# Patient Record
Sex: Female | Born: 1960 | Race: White | Hispanic: No | Marital: Married | Smoking: Never smoker
Health system: Southern US, Community
[De-identification: ages and names within clinical notes are randomized; demographics above are authoritative.]

## PROBLEM LIST (undated history)

## (undated) DIAGNOSIS — Z9289 Personal history of other medical treatment: Secondary | ICD-10-CM

## (undated) DIAGNOSIS — D649 Anemia, unspecified: Secondary | ICD-10-CM

## (undated) DIAGNOSIS — F32A Depression, unspecified: Secondary | ICD-10-CM

## (undated) DIAGNOSIS — E7800 Pure hypercholesterolemia, unspecified: Secondary | ICD-10-CM

## (undated) DIAGNOSIS — F419 Anxiety disorder, unspecified: Secondary | ICD-10-CM

## (undated) DIAGNOSIS — I639 Cerebral infarction, unspecified: Secondary | ICD-10-CM

## (undated) DIAGNOSIS — Z8719 Personal history of other diseases of the digestive system: Secondary | ICD-10-CM

## (undated) DIAGNOSIS — I10 Essential (primary) hypertension: Secondary | ICD-10-CM

---

## 2003-10-12 ENCOUNTER — Other Ambulatory Visit: Admission: RE | Admit: 2003-10-12 | Discharge: 2003-10-12 | Payer: Self-pay | Admitting: Obstetrics and Gynecology

## 2004-10-16 ENCOUNTER — Other Ambulatory Visit: Admission: RE | Admit: 2004-10-16 | Discharge: 2004-10-16 | Payer: Self-pay | Admitting: Obstetrics and Gynecology

## 2005-12-11 ENCOUNTER — Other Ambulatory Visit: Admission: RE | Admit: 2005-12-11 | Discharge: 2005-12-11 | Payer: Self-pay | Admitting: Obstetrics and Gynecology

## 2007-03-12 ENCOUNTER — Encounter: Admission: RE | Admit: 2007-03-12 | Discharge: 2007-03-12 | Payer: Self-pay | Admitting: Emergency Medicine

## 2007-03-17 ENCOUNTER — Encounter: Admission: RE | Admit: 2007-03-17 | Discharge: 2007-03-17 | Payer: Self-pay | Admitting: Emergency Medicine

## 2010-12-10 ENCOUNTER — Encounter: Payer: Self-pay | Admitting: Emergency Medicine

## 2016-06-07 DIAGNOSIS — I10 Essential (primary) hypertension: Secondary | ICD-10-CM | POA: Insufficient documentation

## 2016-06-07 DIAGNOSIS — E7800 Pure hypercholesterolemia, unspecified: Secondary | ICD-10-CM | POA: Insufficient documentation

## 2017-02-28 DIAGNOSIS — Z8673 Personal history of transient ischemic attack (TIA), and cerebral infarction without residual deficits: Secondary | ICD-10-CM | POA: Insufficient documentation

## 2017-02-28 DIAGNOSIS — Z6830 Body mass index (BMI) 30.0-30.9, adult: Secondary | ICD-10-CM | POA: Insufficient documentation

## 2017-02-28 DIAGNOSIS — E6609 Other obesity due to excess calories: Secondary | ICD-10-CM | POA: Insufficient documentation

## 2017-02-28 DIAGNOSIS — K219 Gastro-esophageal reflux disease without esophagitis: Secondary | ICD-10-CM | POA: Insufficient documentation

## 2017-12-19 DIAGNOSIS — F325 Major depressive disorder, single episode, in full remission: Secondary | ICD-10-CM | POA: Insufficient documentation

## 2020-07-20 DIAGNOSIS — C801 Malignant (primary) neoplasm, unspecified: Secondary | ICD-10-CM

## 2020-07-20 DIAGNOSIS — E119 Type 2 diabetes mellitus without complications: Secondary | ICD-10-CM

## 2020-07-20 HISTORY — DX: Type 2 diabetes mellitus without complications: E11.9

## 2020-07-20 HISTORY — DX: Malignant (primary) neoplasm, unspecified: C80.1

## 2020-08-20 ENCOUNTER — Inpatient Hospital Stay (HOSPITAL_BASED_OUTPATIENT_CLINIC_OR_DEPARTMENT_OTHER)
Admission: EM | Admit: 2020-08-20 | Discharge: 2020-08-24 | DRG: 436 | Disposition: A | Discharge status: Home / Self Care | Payer: 59 | Attending: Internal Medicine | Admitting: Internal Medicine

## 2020-08-20 ENCOUNTER — Other Ambulatory Visit: Payer: Self-pay

## 2020-08-20 ENCOUNTER — Encounter (HOSPITAL_BASED_OUTPATIENT_CLINIC_OR_DEPARTMENT_OTHER): Payer: Self-pay | Admitting: Emergency Medicine

## 2020-08-20 ENCOUNTER — Emergency Department (HOSPITAL_BASED_OUTPATIENT_CLINIC_OR_DEPARTMENT_OTHER): Payer: 59

## 2020-08-20 DIAGNOSIS — E876 Hypokalemia: Secondary | ICD-10-CM | POA: Diagnosis not present

## 2020-08-20 DIAGNOSIS — E119 Type 2 diabetes mellitus without complications: Secondary | ICD-10-CM

## 2020-08-20 DIAGNOSIS — E860 Dehydration: Secondary | ICD-10-CM | POA: Diagnosis not present

## 2020-08-20 DIAGNOSIS — K831 Obstruction of bile duct: Secondary | ICD-10-CM | POA: Diagnosis not present

## 2020-08-20 DIAGNOSIS — C250 Malignant neoplasm of head of pancreas: Secondary | ICD-10-CM | POA: Diagnosis present

## 2020-08-20 DIAGNOSIS — E871 Hypo-osmolality and hyponatremia: Secondary | ICD-10-CM | POA: Diagnosis present

## 2020-08-20 DIAGNOSIS — Z79899 Other long term (current) drug therapy: Secondary | ICD-10-CM

## 2020-08-20 DIAGNOSIS — R17 Unspecified jaundice: Secondary | ICD-10-CM | POA: Diagnosis present

## 2020-08-20 DIAGNOSIS — R7401 Elevation of levels of liver transaminase levels: Secondary | ICD-10-CM | POA: Diagnosis not present

## 2020-08-20 DIAGNOSIS — E7800 Pure hypercholesterolemia, unspecified: Secondary | ICD-10-CM | POA: Diagnosis present

## 2020-08-20 DIAGNOSIS — E663 Overweight: Secondary | ICD-10-CM | POA: Insufficient documentation

## 2020-08-20 DIAGNOSIS — K8689 Other specified diseases of pancreas: Secondary | ICD-10-CM | POA: Diagnosis present

## 2020-08-20 DIAGNOSIS — Z888 Allergy status to other drugs, medicaments and biological substances status: Secondary | ICD-10-CM | POA: Diagnosis not present

## 2020-08-20 DIAGNOSIS — R7989 Other specified abnormal findings of blood chemistry: Secondary | ICD-10-CM | POA: Diagnosis present

## 2020-08-20 DIAGNOSIS — Z885 Allergy status to narcotic agent status: Secondary | ICD-10-CM

## 2020-08-20 DIAGNOSIS — Z7982 Long term (current) use of aspirin: Secondary | ICD-10-CM

## 2020-08-20 DIAGNOSIS — Z8249 Family history of ischemic heart disease and other diseases of the circulatory system: Secondary | ICD-10-CM

## 2020-08-20 DIAGNOSIS — F32A Depression, unspecified: Secondary | ICD-10-CM | POA: Diagnosis present

## 2020-08-20 DIAGNOSIS — Z20822 Contact with and (suspected) exposure to covid-19: Secondary | ICD-10-CM | POA: Diagnosis present

## 2020-08-20 DIAGNOSIS — I10 Essential (primary) hypertension: Secondary | ICD-10-CM | POA: Diagnosis not present

## 2020-08-20 HISTORY — DX: Essential (primary) hypertension: I10

## 2020-08-20 HISTORY — DX: Pure hypercholesterolemia, unspecified: E78.00

## 2020-08-20 LAB — COMPREHENSIVE METABOLIC PANEL
ALT: 473 U/L — ABNORMAL HIGH (ref 0–44)
AST: 349 U/L — ABNORMAL HIGH (ref 15–41)
Albumin: 3.8 g/dL (ref 3.5–5.0)
Alkaline Phosphatase: 903 U/L — ABNORMAL HIGH (ref 38–126)
Anion gap: 12 (ref 5–15)
BUN: 15 mg/dL (ref 6–20)
CO2: 28 mmol/L (ref 22–32)
Calcium: 9.1 mg/dL (ref 8.9–10.3)
Chloride: 92 mmol/L — ABNORMAL LOW (ref 98–111)
Creatinine, Ser: 0.95 mg/dL (ref 0.44–1.00)
GFR calc Af Amer: >60 mL/min (ref >60)
GFR calc non Af Amer: >60 mL/min (ref >60)
Glucose, Bld: 145 mg/dL — ABNORMAL HIGH (ref 70–99)
Potassium: 2.8 mmol/L — ABNORMAL LOW (ref 3.5–5.1)
Sodium: 132 mmol/L — ABNORMAL LOW (ref 135–145)
Total Bilirubin: 7.3 mg/dL — ABNORMAL HIGH (ref 0.3–1.2)
Total Protein: 7.5 g/dL (ref 6.5–8.1)

## 2020-08-20 LAB — PROTIME-INR
INR: 0.9 (ref 0.8–1.2)
Prothrombin Time: 12.2 seconds (ref 11.4–15.2)

## 2020-08-20 LAB — URINALYSIS, ROUTINE W REFLEX MICROSCOPIC
Bilirubin Urine: NEGATIVE
Glucose, UA: NEGATIVE mg/dL
Ketones, ur: NEGATIVE mg/dL
Leukocytes,Ua: NEGATIVE
Nitrite: NEGATIVE
Protein, ur: NEGATIVE mg/dL
Specific Gravity, Urine: <1.005 — ABNORMAL LOW (ref 1.005–1.030)
pH: 6.5 (ref 5.0–8.0)

## 2020-08-20 LAB — CBC
HCT: 37 % (ref 36.0–46.0)
Hemoglobin: 12.4 g/dL (ref 12.0–15.0)
MCH: 26.9 pg (ref 26.0–34.0)
MCHC: 33.5 g/dL (ref 30.0–36.0)
MCV: 80.3 fL (ref 80.0–100.0)
Platelets: 366 10*3/uL (ref 150–400)
RBC: 4.61 MIL/uL (ref 3.87–5.11)
RDW: 15.8 % — ABNORMAL HIGH (ref 11.5–15.5)
WBC: 9.1 10*3/uL (ref 4.0–10.5)
nRBC: 0 % (ref 0.0–0.2)

## 2020-08-20 LAB — URINALYSIS, MICROSCOPIC (REFLEX)

## 2020-08-20 LAB — RESPIRATORY PANEL BY RT PCR (FLU A&B, COVID)
Influenza A by PCR: NEGATIVE
Influenza B by PCR: NEGATIVE
SARS Coronavirus 2 by RT PCR: NEGATIVE

## 2020-08-20 LAB — HEPATITIS PANEL, ACUTE
HCV Ab: NONREACTIVE
Hep A IgM: NONREACTIVE
Hep B C IgM: NONREACTIVE
Hepatitis B Surface Ag: NONREACTIVE

## 2020-08-20 LAB — LIPASE, BLOOD: Lipase: 25 U/L (ref 11–51)

## 2020-08-20 LAB — MAGNESIUM: Magnesium: 2 mg/dL (ref 1.7–2.4)

## 2020-08-20 LAB — HEMOGLOBIN A1C
Hgb A1c MFr Bld: 7.1 % — ABNORMAL HIGH (ref 4.8–5.6)
Mean Plasma Glucose: 157.07 mg/dL

## 2020-08-20 MED ORDER — ONDANSETRON HCL 4 MG PO TABS: 4.0000 mg | ORAL_TABLET | Freq: Four times a day (QID) | ORAL | Status: DC | PRN

## 2020-08-20 MED ORDER — IOHEXOL 300 MG/ML  SOLN
100.0000 mL | Freq: Once | INTRAMUSCULAR | Status: AC | PRN
  Administered 2020-08-20: 100 mL via INTRAVENOUS

## 2020-08-20 MED ORDER — CITALOPRAM HYDROBROMIDE 20 MG PO TABS
40.0000 mg | ORAL_TABLET | Freq: Every day | ORAL | Status: DC
  Administered 2020-08-21 – 2020-08-23 (×2): 40 mg via ORAL
  Filled 2020-08-20 (×2): qty 2

## 2020-08-20 MED ORDER — POTASSIUM CHLORIDE CRYS ER 20 MEQ PO TBCR
40.0000 meq | EXTENDED_RELEASE_TABLET | Freq: Once | ORAL | Status: AC
  Administered 2020-08-20: 40 meq via ORAL
  Filled 2020-08-20: qty 2

## 2020-08-20 MED ORDER — SODIUM CHLORIDE 0.9 % IV SOLN: Freq: Once | INTRAVENOUS | Status: AC

## 2020-08-20 MED ORDER — POTASSIUM CHLORIDE 10 MEQ/100ML IV SOLN
10.0000 meq | INTRAVENOUS | Status: DC
  Administered 2020-08-20: 10 meq via INTRAVENOUS
  Filled 2020-08-20: qty 100

## 2020-08-20 MED ORDER — SODIUM CHLORIDE 0.9% FLUSH
3.0000 mL | Freq: Two times a day (BID) | INTRAVENOUS | Status: DC
  Administered 2020-08-21 – 2020-08-23 (×3): 3 mL via INTRAVENOUS

## 2020-08-20 MED ORDER — ONDANSETRON HCL 4 MG/2ML IJ SOLN: 4.0000 mg | Freq: Four times a day (QID) | INTRAMUSCULAR | Status: DC | PRN

## 2020-08-20 NOTE — ED Notes (Signed)
[facility] with [facility]

## 2020-08-20 NOTE — ED Triage Notes (Addendum)
Pt states she has noticed her skin yellow for 3 days. Also reports dark urine. Sent from [facility]. Denies any other symptoms.

## 2020-08-20 NOTE — H&P (Signed)
Jacqueline Tyler HCW:[id number] DOB: 06-Dec-1960 DOA: 08/20/2020     PCP: Nicola Girt, DO   Outpatient Specialists:     Patient arrived to ER on 08/20/20 at 1203 Referred by Attending Annita Brod, MD   Patient coming from: home Lives   With family    Chief Complaint:  Chief Complaint  Patient presents with  . Jaundice    HPI: Jacqueline Tyler is a 59 y.o. female with medical history significant of HTN, HLD    Presented with  Jaundice for the past 3 days and dark urine, Jaundice could have been coming on over the past 3 wks.  Her stools have been pale. No associated pain or fever,  Denies ETOH abuse or Tylenol use   Last colonoscopy was 68 y ago with one polyp  Reports 1 wk ago nausea/ vomiting lasted for 3 days After she ate lunch She have had decreased appetite    Infectious risk factors:  Reports  N/V    severe fatigue    Has  been vaccinated against COVID recent 9/1 and 08/13/20   Initial COVID TEST  NEGATIVE   Lab Results  Component Value Date   [place] NEGATIVE 08/20/2020    Regarding pertinent Chronic problems:     Hyperlipidemia - on statins Simvastatin 80    HTN on HCTZ 12.5, Clonadin 0.1 mg QHS       While in ER: Noted to have Na down to 132 K 2.8 Elevated LFT's Alk Phos in 900's and T Bili 7.3 CT abd showed obstructing pancreatic MAss With Biliary ductal Dilation  ER Provider Called:      Spoke to Dr Cristina Gong from GI who reports they would anticipate ERCP on Monday.  They Recommend admit to medicine      Hospitalist was called for admission for pancreatic mass  The following Work up has been ordered so far:  Orders Placed This Encounter  Procedures  . Critical Care  . Respiratory Panel by RT PCR (Flu A&B, Covid) - Nasopharyngeal Swab  . CT ABDOMEN PELVIS W CONTRAST  . CBC  . Comprehensive metabolic panel  . Urinalysis, Routine w reflex microscopic Urine, Clean Catch  . Urinalysis, Microscopic (reflex)  . Hepatitis panel,  acute  . Protime-INR  . Lipase, blood  . Magnesium  . Cancer antigen 19-9  . Hemoglobin A1c  . Diet NPO time specified  . Consult to hospitalist  ALL PATIENTS BEING ADMITTED/HAVING PROCEDURES NEED COVID-19 SCREENING  . Admit to Inpatient (patient's expected length of stay will be greater than 2 midnights or inpatient only procedure)    Following Medications were ordered in ER: Medications  iohexol (OMNIPAQUE) 300 MG/ML solution 100 mL (100 mLs Intravenous Contrast Given 08/20/20 1434)  0.9 %  sodium chloride infusion ( Intravenous Stopped 08/20/20 1751)  potassium chloride SA (KLOR-CON) CR tablet 40 mEq (40 mEq Oral Given 08/20/20 1818)        Consult Orders  (From admission, onward)         Start     Ordered   08/20/20 1559  Consult to hospitalist  ALL PATIENTS BEING ADMITTED/HAVING PROCEDURES NEED COVID-19 SCREENING  Once       Comments: ALL PATIENTS BEING ADMITTED/HAVING PROCEDURES NEED COVID-19 SCREENING  Provider:  (Not yet assigned)  Question Answer Comment  Place call to: [facility]   Reason for Consult Admit      08/20/20 1558           Significant  initial  Findings: Abnormal Labs Reviewed  CBC - Abnormal; Notable for the following components:      Result Value   RDW 15.8 (*)    All other components within normal limits  COMPREHENSIVE METABOLIC PANEL - Abnormal; Notable for the following components:   Sodium 132 (*)    Potassium 2.8 (*)    Chloride 92 (*)    Glucose, Bld 145 (*)    AST 349 (*)    ALT 473 (*)    Alkaline Phosphatase 903 (*)    Total Bilirubin 7.3 (*)    All other components within normal limits  URINALYSIS, ROUTINE W REFLEX MICROSCOPIC - Abnormal; Notable for the following components:   Specific Gravity, Urine <1.005 (*)    Hgb urine dipstick TRACE (*)    All other components within normal limits  URINALYSIS, MICROSCOPIC (REFLEX) - Abnormal; Notable for the following components:   Bacteria, UA FEW (*)    All other components  within normal limits    Otherwise labs showing:    Recent Labs  Lab 08/20/20 1218 08/20/20 1426  NA 132*  --   K 2.8*  --   CO2 28  --   GLUCOSE 145*  --   BUN 15  --   CREATININE 0.95  --   CALCIUM 9.1  --   MG  --  2.0    Cr   stable,   Lab Results  Component Value Date   CREATININE 0.95 08/20/2020    Recent Labs  Lab 08/20/20 1218  AST 349*  ALT 473*  ALKPHOS 903*  BILITOT 7.3*  PROT 7.5  ALBUMIN 3.8   Lab Results  Component Value Date   CALCIUM 9.1 08/20/2020    WBC    Component Value Date/Time   WBC 9.1 08/20/2020 1218   Plt: Lab Results  Component Value Date   PLT 366 08/20/2020    No results for input(s): DDIMER, FERRITIN, LDH, CRP in the last 72 hours.  Lab Results  Component Value Date   [place] NEGATIVE 08/20/2020    HG/HCT   Stable,     Component Value Date/Time   HGB 12.4 08/20/2020 1218   HCT 37.0 08/20/2020 1218   MCV 80.3 08/20/2020 1218    Recent Labs  Lab 08/20/20 1426  LIPASE 25   No results for input(s): AMMONIA in the last 168 hours.    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    There is moderate intrahepatic and extrahepatic biliary dilatation which appears to be due to the pancreatic head mass.  ED Triage Vitals  Enc Vitals Group     BP 08/20/20 1214 123/70     Pulse Rate 08/20/20 1214 60     Resp 08/20/20 1214 18     Temp 08/20/20  1214 98.6 F (37 C)  Temp Source 08/20/20 1214 Oral     SpO2 08/20/20 1214 99 %     Weight 08/20/20 1212 155 lb (70.3 kg)     Height 08/20/20 1212 $RemoveBefor'5\' 2"'wYbscmLVAlrZ$  (1.575 m)     Head Circumference --      Peak Flow --      Pain Score 08/20/20 1212 0     Pain Loc --      Pain Edu? --      Excl. in [place]? --   TMAX(24)@       Latest  Blood pressure (!) 134/56, pulse (!) 57, temperature 98.3 F (36.8 C), temperature source Oral, resp. rate 16, height $RemoveBe'5\' 2"'zrDKkYJSA$  (1.575 m), weight 68.9 kg, SpO2 97 %.  Review of Systems:    Pertinent positives include:  nausea, vomiting, poor appetite  jaundice change in color of urine and stool Constitutional:  No weight loss, night sweats, Fevers, chills, fatigue, weight loss  HEENT:  No headaches, Difficulty swallowing,Tooth/dental problems,Sore throat,  No sneezing, itching, ear ache, nasal congestion, post nasal drip,  Cardio-vascular:  No chest pain, Orthopnea, PND, anasarca, dizziness, palpitations.no Bilateral lower extremity swelling  GI:  No heartburn, indigestion, abdominal pain, diarrhea, change in bowel habits,  melena, blood in stool, hematemesis Resp:  no shortness of breath at rest. No dyspnea on exertion, No excess mucus, no productive cough, No non-productive cough, No coughing up of blood.No change in color of mucus.No wheezing. Skin:  no rash or lesions.  GU:  no dysuria,  no urgency or frequency. No straining to urinate.  No flank pain.  Musculoskeletal:  No joint pain or no joint swelling. No decreased range of motion. No back pain.  Psych:  No change in mood or affect. No depression or anxiety. No memory loss.  Neuro: no localizing neurological complaints, no tingling, no weakness, no double vision, no gait abnormality, no slurred speech, no confusion  All systems reviewed and apart from [place] all are negative  Past Medical History:   Past Medical History:  Diagnosis Date  . High cholesterol   . Hypertension       History  reviewed. No pertinent surgical history.  Social History:  Ambulatory  independently      reports that she has never smoked. She has never used smokeless tobacco. She reports previous alcohol use. She reports that she does not use drugs.   Family History:   Family History  Problem Relation Age of Onset  . CAD Mother   Mother died of MI in 81s No family history of malignancy   Allergies: Allergies  Allergen Reactions  . Tyloxapol      Prior to Admission medications   Not on File   Physical Exam: Vitals with BMI 08/20/2020 08/20/2020 08/20/2020  Height - $Remove'5\' 2"'ULiZwKN$  -  Weight - 151 lbs 14 oz -  BMI - 24.58 -  Systolic 099 - 833  Diastolic 56 - 55  Pulse 57 - 68     1. General:  in No  Acute distress   acutely ill jaundiced-appearing 2. Psychological: Alert and   Oriented 3. Head/ENT:    Dry Mucous Membranes                          Head Non traumatic, neck supple                           Poor Dentition 4. SKIN:  decreased Skin turgor,  Skin clean Dry and intact no rash 5. Heart: Regular rate and rhythm no  Murmur, no Rub or gallop 6. Lungs:  Clear to auscultation bilaterally, no wheezes or crackles   7. Abdomen: Soft,  non-tender, Non distended bowel sounds present 8. Lower extremities: no clubbing, cyanosis, no edema 9. Neurologically Grossly intact, moving all 4 extremities equally   10. MSK: Normal range of motion   All other LABS:     Recent Labs  Lab 08/20/20 1218  WBC 9.1  HGB 12.4  HCT 37.0  MCV 80.3  PLT 366     Recent Labs  Lab 08/20/20 1218 08/20/20 1426  NA 132*  --   K 2.8*  --   CL 92*  --   CO2 28  --   GLUCOSE 145*  --   BUN 15  --   CREATININE 0.95  --   CALCIUM 9.1  --   MG  --  2.0     Recent Labs  Lab 08/20/20 1218  AST 349*  ALT 473*  ALKPHOS 903*  BILITOT 7.3*  PROT 7.5  ALBUMIN 3.8       Cultures: No results found for: SDES, SPECREQUEST, CULT, REPTSTATUS   Radiological Exams on Admission: CT ABDOMEN PELVIS W  CONTRAST  Result Date: 08/20/2020 CLINICAL DATA:  Jaundice. EXAM: CT ABDOMEN AND PELVIS WITH CONTRAST TECHNIQUE: Multidetector CT imaging of the abdomen and pelvis was performed using the standard protocol following bolus administration of intravenous contrast. CONTRAST:  OMNIPAQUE IOHEXOL 300 MG/ML  SOLN COMPARISON:  January 16, 2006. FINDINGS: Lower chest: No acute abnormality. Hepatobiliary: No gallstones are noted. There is moderate intrahepatic and extrahepatic biliary dilatation which appears to be due to 3.3 x 2.2 cm low density mass in the pancreatic head consistent with malignancy. 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 This results in significant pancreatic ductal dilatation. 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 Collateral circulation is noted. Spleen: Normal in size without focal abnormality. Adrenals/Urinary Tract: Adrenal glands are unremarkable. Kidneys are normal, without renal calculi, focal lesion, or hydronephrosis. Bladder is unremarkable. Stomach/Bowel: The stomach appears normal. There is no evidence of bowel obstruction or inflammation. Vascular/Lymphatic: Aortic atherosclerosis. No enlarged abdominal or pelvic lymph nodes. Reproductive: Probable 2.7 cm uterine fibroid. No adnexal abnormality is noted. Other: No abdominal wall hernia or abnormality. No abdominopelvic ascites. Musculoskeletal: No acute or significant osseous findings. IMPRESSION: 1. 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 Collateral circulation is noted. There is moderate intrahepatic and extrahepatic biliary dilatation which appears to be due to the pancreatic head  mass. Pancreatic ductal dilatation is noted as well. 2. Probable 2.7 cm uterine fibroid. 3. Aortic atherosclerosis. Aortic Atherosclerosis (ICD10-I70.0). Electronically Signed   By: Lupita Raider M.D.   On: 08/20/2020 15:04    Chart has been reviewed   Assessment/Plan  59 y.o. female with medical history significant of HTN, HLD     Admitted for pancreatic mass  Present on Admission: . Pancreatic mass -strongly suspect malignancy,  plan for ERCP on Monday.  Keep n.p.o. post midnight on Monday morning  Continue to  follow liver function At this point pain-free  . Hypertension -somewhat soft blood pressures initially will hold home medications given hyponatremia hold HCTZ  . Hypokalemia -magnesium within normal limits were replaced at [facility] will recheck and replace if needed  . Hyponatremia -in the setting of decreased p.o. intake recent nausea vomiting we will gently rehydrate obtain urine electrolytes and follow  . Dehydration gently rehydrate patient have had decreased p.o. intake  History of depression continue citalopram  Other plan as per orders.  DVT prophylaxis:  SCD       Code Status:    Code Status: Not on file FULL CODE   as per patient  I had personally discussed CODE STATUS with patient     Family Communication:   Family not at  Bedside    Disposition Plan:                               To home once workup is complete and patient is stable   Following barriers for discharge:                            Electrolytes corrected                                                           Will need to be able to tolerate PO                                                      Will need consultants to evaluate patient prior to discharge                                      Consults called: Howie Ill is aware  Admission status:  ED Disposition    ED Disposition Condition [place]: [facility] [[id number]]  Level  of Care: Med-Surg [16]  May admit patient to [facility] or [facility] if equivalent level of care is available:: Yes  Covid Evaluation: Confirmed COVID Negative  Diagnosis: Pancreatic mass [[id number]]  Admitting Physician: Junius Argyle  Attending Physician: Annita Brod [[id number]]  Estimated length of stay: past midnight tomorrow  Certification:: I certify this patient will need inpatient services for at least 2 midnights         inpatient     I Expect 2 midnight stay secondary to severity of patient's current illness need for inpatient interventions justified by the following:   Severe lab/radiological/exam abnormalities including:   Pancreatic mass with obstruction . hypokalemia   That are currently affecting medical management.   I expect  patient to be hospitalized for 2 midnights requiring inpatient medical care.  Patient is at high risk for adverse outcome (such as loss of life or disability) if not treated.  Indication for inpatient stay as follows:    Need for operative/procedural  intervention   Need for  IV fluids,  Level of care    medical floor     Lab Results  Component Value Date   [place] NEGATIVE 08/20/2020     Precautions: admitted as  Covid Negative     PPE: Used by the provider:   P100  eye Goggles,  Gloves    Marguerita Stapp 08/21/2020, 12:23 AM    [facility]     after 2 AM please page floor coverage PA If 7AM-7PM, please contact the day team taking care of the patient using [email or web address]   Patient was evaluated in the context of the global COVID-19 pandemic, which necessitated consideration that the patient might be at risk for infection with the SARS-CoV-2 virus that causes COVID-19. Institutional protocols and algorithms that pertain to the evaluation of patients at risk for COVID-19 are in a state of rapid change based on information released by regulatory bodies including the CDC and federal and state  organizations. These policies and algorithms were followed during the patient's care.

## 2020-08-20 NOTE — ED Notes (Signed)
hospitalist consulted via [facility] spoke with [facility]

## 2020-08-20 NOTE — ED Provider Notes (Addendum)
Pt signed out to me by Dr Sedonia Small.  Briefly this is a 59 year old female presenting to the ED with painless jaundice for less than 1 week, reporting abdominal fullness and vomiting in the afternoons for the same period of time.  Received 2nd vaccine dose at the onset of these symptoms.  CT scan here shows an obstructing mass at the head of the pancreas with biliary ductal dilation.  There is a transminitis with Tbili 7.  No fever or signs of cholangitis.  No encephalopathy.  On exam she is comfortable appearing but does exhibit diffuse jaundice.   She repots a hx of colonoscopy in past 10 years with 1 polyp removed.  No personal or family hx of cancer.  No smoking hx.  Doesn't drink alcohol.  Unassigned with GI.   Plan to discuss with GI team, anticipate medical admission.  330 pm- paged dr Cristina Gong for GI  4:15 - admitted to Dr Gevena Barre hospitalist.  Spoke to Dr Cristina Gong from GI who reports they would anticipate ERCP on Monday.  Patient and her husband updated about diagnosis and timeline.  They prefer to stay with [place] at this time.  .Critical Care Performed by: Wyvonnia Dusky, MD Authorized by: Wyvonnia Dusky, MD    CANNOT DELETE CRITICAL CARE SMARTPHRASE.  NO CC billed by me.    Wyvonnia Dusky, MD 08/20/20 1616    Wyvonnia Dusky, MD 08/20/20 1642    Wyvonnia Dusky, MD 08/20/20 [phone]

## 2020-08-20 NOTE — ED Provider Notes (Signed)
[facility] Emergency Department Provider Note MRN:  [id number]  Arrival date & time: 08/20/20     Chief Complaint   Jaundice   History of Present Illness   Jacqueline Tyler is a 59 y.o. year-old female with no pertinent past medical history presenting to the ED with chief complaint of jaundice.  Patient's PCP noticed jaundice recently, patient has elevated liver numbers. She thinks that the jaundice has been occurring gradually over the past two or 3 weeks, most noticeable over the past 3 days. Also endorsing dark yellow urine and stools that are pale. Denies any pain, no fever, no other complaints. No excessive use of Tylenol recently, does not drink alcohol, no recent travel, no eating of wild mushrooms.  Review of Systems  A complete 10 system review of systems was obtained and all systems are negative except as noted in the HPI and PMH.   Patient's Health History    Past Medical History:  Diagnosis Date  . High cholesterol   . Hypertension     History reviewed. No pertinent surgical history.  No family history on file.  Social History   Socioeconomic History  . Marital status: Unknown    Spouse name: Not on file  . Number of children: Not on file  . Years of education: Not on file  . Highest education level: Not on file  Occupational History  . Not on file  Tobacco Use  . Smoking status: Never Smoker  . Smokeless tobacco: Never Used  Substance and Sexual Activity  . Alcohol use: Not Currently  . Drug use: Never  . Sexual activity: Not on file  Other Topics Concern  . Not on file  Social History Narrative  . Not on file   Social Determinants of Health   Financial Resource Strain:   . Difficulty of Paying Living Expenses: Not on file  Food Insecurity:   . Worried About Charity fundraiser in the Last Year: Not on file  . Ran Out of Food in the Last Year: Not on file  Transportation Needs:   . Lack of Transportation (Medical): Not on  file  . Lack of Transportation (Non-Medical): Not on file  Physical Activity:   . Days of Exercise per Week: Not on file  . Minutes of Exercise per Session: Not on file  Stress:   . Feeling of Stress : Not on file  Social Connections:   . Frequency of Communication with Friends and Family: Not on file  . Frequency of Social Gatherings with Friends and Family: Not on file  . Attends Religious Services: Not on file  . Active Member of Clubs or Organizations: Not on file  . Attends Archivist Meetings: Not on file  . Marital Status: Not on file  Intimate Partner Violence:   . Fear of Current or Ex-Partner: Not on file  . Emotionally Abused: Not on file  . Physically Abused: Not on file  . Sexually Abused: Not on file     Physical Exam   Vitals:   08/20/20 1214 08/20/20 1331  BP: 123/70 136/73  Pulse: 60 (!) 56  Resp: 18   Temp: 98.6 F (37 C) 98.3 F (36.8 C)  SpO2: 99% 98%    CONSTITUTIONAL: Well-appearing, NAD, mild generalized jaundice NEURO:  Alert and oriented x 3, no focal deficits EYES:  eyes equal and reactive ENT/NECK:  no LAD, no JVD CARDIO: Regular rate, well-perfused, normal S1 and S2 PULM:  CTAB  no wheezing or rhonchi GI/GU:  normal bowel sounds, non-distended, non-tender MSK/SPINE:  No gross deformities, no edema SKIN:  no rash, atraumatic PSYCH:  Appropriate speech and behavior  *Additional and/or pertinent findings included in MDM below  Diagnostic and Interventional Summary    EKG Interpretation  Date/Time:    Ventricular Rate:    PR Interval:    QRS Duration:   QT Interval:    QTC Calculation:   R Axis:     Text Interpretation:        Labs Reviewed  CBC - Abnormal; Notable for the following components:      Result Value   RDW 15.8 (*)    All other components within normal limits  COMPREHENSIVE METABOLIC PANEL - Abnormal; Notable for the following components:   Sodium 132 (*)    Potassium 2.8 (*)    Chloride 92 (*)     Glucose, Bld 145 (*)    AST 349 (*)    ALT 473 (*)    Alkaline Phosphatase 903 (*)    Total Bilirubin 7.3 (*)    All other components within normal limits  URINALYSIS, ROUTINE W REFLEX MICROSCOPIC - Abnormal; Notable for the following components:   Specific Gravity, Urine <1.005 (*)    Hgb urine dipstick TRACE (*)    All other components within normal limits  URINALYSIS, MICROSCOPIC (REFLEX) - Abnormal; Notable for the following components:   Bacteria, UA FEW (*)    All other components within normal limits  RESPIRATORY PANEL BY RT PCR (FLU A&B, COVID)  PROTIME-INR  LIPASE, BLOOD  HEPATITIS PANEL, ACUTE    CT ABDOMEN PELVIS W CONTRAST  Final Result      Medications  iohexol (OMNIPAQUE) 300 MG/ML solution 100 mL (100 mLs Intravenous Contrast Given 08/20/20 1434)     Procedures  /  Critical Care Procedures  ED Course and Medical Decision Making  I have reviewed the triage vital signs, the nursing notes, and pertinent available records from the EMR.  Listed above are laboratory and imaging tests that I personally ordered, reviewed, and interpreted and then considered in my medical decision making (see below for details).  Painless jaundice, concern for biliary obstruction or pancreatic cancer, CT pending.     Signed out to oncoming provider at shift change.  Barth Kirks. Sedonia Small, MD [place] [email or web address]  Final Clinical Impressions(s) / ED Diagnoses     ICD-10-CM   1. Jaundice  R17     ED Discharge Orders    None       Discharge Instructions Discussed with and Provided to Patient:   Discharge Instructions   None       Maudie Flakes, MD 08/20/20 1511

## 2020-08-20 NOTE — Progress Notes (Signed)
Acceptance note:  59 year old female with past medical history of hypertension presented to [facility] emergency department with complaints of painless jaundice x1 week along with abdominal fullness and vomiting.  Had received second dose of Covid vaccine at same time and initially attributed symptoms to that.  When did not improve, presented to emergency room.  CT scan in emergency room noted obstructing mass at head of pancreas with biliary ductal dilatation concerning for cancer.  Lab work noteworthy for potassium of 2.8, alkaline phosphatase of 903, AST 349/ALT 473 and total bilirubin of 9.3 with normal INR.  Lipase normal.  Emergency room spoke with Dr. Cristina Gong of [facility] gastroenterology on call for unassigned at [facility].  Patient will be admitted to the hospitalist service with plans for ERCP on Monday, 10/4.  Hypokalemia: Replace potassium Hyperglycemia: No history of diabetes, check A1c Suspected pancreatic cancer: We will check CA 19-9 level

## 2020-08-21 DIAGNOSIS — E119 Type 2 diabetes mellitus without complications: Secondary | ICD-10-CM

## 2020-08-21 DIAGNOSIS — R7989 Other specified abnormal findings of blood chemistry: Secondary | ICD-10-CM

## 2020-08-21 DIAGNOSIS — K831 Obstruction of bile duct: Secondary | ICD-10-CM

## 2020-08-21 LAB — CBC WITH DIFFERENTIAL/PLATELET
Abs Immature Granulocytes: 0.07 10*3/uL (ref 0.00–0.07)
Basophils Absolute: 0.1 10*3/uL (ref 0.0–0.1)
Basophils Relative: 1 %
Eosinophils Absolute: 0.4 10*3/uL (ref 0.0–0.5)
Eosinophils Relative: 4 %
HCT: 37.7 % (ref 36.0–46.0)
Hemoglobin: 12.6 g/dL (ref 12.0–15.0)
Immature Granulocytes: 1 %
Lymphocytes Relative: 22 %
Lymphs Abs: 2.1 10*3/uL (ref 0.7–4.0)
MCH: 27.3 pg (ref 26.0–34.0)
MCHC: 33.4 g/dL (ref 30.0–36.0)
MCV: 81.8 fL (ref 80.0–100.0)
Monocytes Absolute: 0.8 10*3/uL (ref 0.1–1.0)
Monocytes Relative: 9 %
Neutro Abs: 6 10*3/uL (ref 1.7–7.7)
Neutrophils Relative %: 63 %
Platelets: 380 10*3/uL (ref 150–400)
RBC: 4.61 MIL/uL (ref 3.87–5.11)
RDW: 16.5 % — ABNORMAL HIGH (ref 11.5–15.5)
WBC: 9.6 10*3/uL (ref 4.0–10.5)
nRBC: 0 % (ref 0.0–0.2)

## 2020-08-21 LAB — BASIC METABOLIC PANEL
Anion gap: 14 (ref 5–15)
BUN: 13 mg/dL (ref 6–20)
CO2: 25 mmol/L (ref 22–32)
Calcium: 9.1 mg/dL (ref 8.9–10.3)
Chloride: 96 mmol/L — ABNORMAL LOW (ref 98–111)
Creatinine, Ser: 0.67 mg/dL (ref 0.44–1.00)
GFR calc Af Amer: >60 mL/min (ref >60)
GFR calc non Af Amer: >60 mL/min (ref >60)
Glucose, Bld: 260 mg/dL — ABNORMAL HIGH (ref 70–99)
Potassium: 3.1 mmol/L — ABNORMAL LOW (ref 3.5–5.1)
Sodium: 135 mmol/L (ref 135–145)

## 2020-08-21 LAB — COMPREHENSIVE METABOLIC PANEL
ALT: 470 U/L — ABNORMAL HIGH (ref 0–44)
AST: 433 U/L — ABNORMAL HIGH (ref 15–41)
Albumin: 3.7 g/dL (ref 3.5–5.0)
Alkaline Phosphatase: 927 U/L — ABNORMAL HIGH (ref 38–126)
Anion gap: 13 (ref 5–15)
BUN: 13 mg/dL (ref 6–20)
CO2: 25 mmol/L (ref 22–32)
Calcium: 9.1 mg/dL (ref 8.9–10.3)
Chloride: 96 mmol/L — ABNORMAL LOW (ref 98–111)
Creatinine, Ser: 0.69 mg/dL (ref 0.44–1.00)
GFR calc Af Amer: >60 mL/min (ref >60)
GFR calc non Af Amer: >60 mL/min (ref >60)
Glucose, Bld: 260 mg/dL — ABNORMAL HIGH (ref 70–99)
Potassium: 3 mmol/L — ABNORMAL LOW (ref 3.5–5.1)
Sodium: 134 mmol/L — ABNORMAL LOW (ref 135–145)
Total Bilirubin: 12 mg/dL — ABNORMAL HIGH (ref 0.3–1.2)
Total Protein: 7.5 g/dL (ref 6.5–8.1)

## 2020-08-21 LAB — MAGNESIUM: Magnesium: 2 mg/dL (ref 1.7–2.4)

## 2020-08-21 LAB — PHOSPHORUS: Phosphorus: 4 mg/dL (ref 2.5–4.6)

## 2020-08-21 LAB — SODIUM, URINE, RANDOM: Sodium, Ur: 95 mmol/L

## 2020-08-21 LAB — CREATININE, URINE, RANDOM: Creatinine, Urine: 54.08 mg/dL

## 2020-08-21 LAB — GLUCOSE, CAPILLARY: Glucose-Capillary: 220 mg/dL — ABNORMAL HIGH (ref 70–99)

## 2020-08-21 LAB — HIV ANTIBODY (ROUTINE TESTING W REFLEX): HIV Screen 4th Generation wRfx: NONREACTIVE

## 2020-08-21 LAB — TSH: TSH: 9.5 u[IU]/mL — ABNORMAL HIGH (ref 0.350–4.500)

## 2020-08-21 LAB — OSMOLALITY, URINE: Osmolality, Ur: 393 mOsm/kg (ref 300–900)

## 2020-08-21 MED ORDER — POTASSIUM CHLORIDE 10 MEQ/100ML IV SOLN
10.0000 meq | INTRAVENOUS | Status: AC
  Administered 2020-08-21 (×5): 10 meq via INTRAVENOUS
  Filled 2020-08-21 (×3): qty 100

## 2020-08-21 MED ORDER — SODIUM CHLORIDE 0.9 % IV SOLN: INTRAVENOUS | Status: DC

## 2020-08-21 MED ORDER — INSULIN ASPART 100 UNIT/ML ~~LOC~~ SOLN
0.0000 [IU] | Freq: Four times a day (QID) | SUBCUTANEOUS | Status: DC
  Administered 2020-08-21: 3 [IU] via SUBCUTANEOUS
  Administered 2020-08-22: 2 [IU] via SUBCUTANEOUS
  Administered 2020-08-22: 1 [IU] via SUBCUTANEOUS
  Administered 2020-08-22: 2 [IU] via SUBCUTANEOUS
  Administered 2020-08-23 (×2): 3 [IU] via SUBCUTANEOUS
  Administered 2020-08-23: 2 [IU] via SUBCUTANEOUS
  Administered 2020-08-24: 1 [IU] via SUBCUTANEOUS
  Administered 2020-08-24: 2 [IU] via SUBCUTANEOUS

## 2020-08-21 MED ORDER — POTASSIUM CHLORIDE 10 MEQ/100ML IV SOLN
INTRAVENOUS | Status: AC
  Administered 2020-08-21: 10 meq
  Filled 2020-08-21: qty 100

## 2020-08-21 MED ORDER — POTASSIUM CHLORIDE 10 MEQ/100ML IV SOLN
INTRAVENOUS | Status: AC
  Filled 2020-08-21: qty 100

## 2020-08-21 MED ORDER — SODIUM CHLORIDE 0.9 % IV SOLN: Freq: Once | INTRAVENOUS | Status: AC

## 2020-08-21 MED ORDER — SODIUM CHLORIDE 0.9 % IV SOLN
2.0000 g | INTRAVENOUS | Status: AC
  Administered 2020-08-22: 2 g via INTRAVENOUS
  Filled 2020-08-21 (×2): qty 20

## 2020-08-21 NOTE — Assessment & Plan Note (Signed)
-   FeNa = 0.9% consistent with prerenal  - continue IVF

## 2020-08-21 NOTE — Consult Note (Signed)
Referring Provider: [facility] Primary Care Physician:  Nicola Girt, DO Primary Gastroenterologist: Dr. Mitchell Heir [facility], [facility])  Reason for Consultation: Obstructive jaundice with pancreatic mass  HPI: Jacqueline Tyler is a 59 y.o. female admitted from [place] yesterday afternoon because of a several day history of dark urine and jaundice.  Blood work showed significant hyperbilirubinemia, and a CT scan shows a several centimeter pancreatic mass causing obstructive dilatation of the common bile duct.  There is possible involvement of the Superior mesenteric vein and the portal vein.  Interestingly, the patient does not have other prodromal symptoms such as anorexia or weight loss, nor symptoms of cholangitis such as fevers and chills.  The only other symptom she reports is pruritus.  Liver chemistries are pertinent for an admission bilirubin of 7.3, which has climbed to 12.0 overnight, alk phos in the 900 range, transaminases are around 400-500.  INR is normal.     Past Medical History:  Diagnosis Date  . High cholesterol   . Hypertension     History reviewed. No pertinent surgical history.  Prior to Admission medications   Medication Sig Start Date End Date Taking? Authorizing Provider  aspirin 81 MG EC tablet Take 1 tablet by mouth daily. 06/13/17  Yes [provider]  citalopram (CELEXA) 40 MG tablet Take 10 mg by mouth daily.  08/08/20  Yes [provider]  cloNIDine (CATAPRES) 0.1 MG tablet Take 0.1 mg by mouth at bedtime. 07/25/20  Yes [provider]  Famotidine (PEPCID PO) Take 1 tablet by mouth daily as needed (acid reflux).   Yes [provider]  hydrochlorothiazide (HYDRODIURIL) 12.5 MG tablet Take 12.5 mg by mouth daily. 07/25/20  Yes [provider]  simvastatin (ZOCOR) 80 MG tablet Take 80 mg by mouth daily. 07/25/20  Yes [provider]    Current Facility-Administered Medications  Medication Dose  Route Frequency Provider Last Rate Last Admin  . 0.9 %  sodium chloride infusion   Intravenous Continuous Dwyane Dee, MD 75 mL/hr at 08/21/20 1011 New Bag at 08/21/20 1011  . 0.9 %  sodium chloride infusion   Intravenous Continuous Hydee Fleece, MD      . citalopram (CELEXA) tablet 40 mg  40 mg Oral Daily Doutova, Anastassia, MD   40 mg at 08/21/20 1009  . ondansetron (ZOFRAN) tablet 4 mg  4 mg Oral Q6H PRN Toy Baker, MD       Or  . ondansetron (ZOFRAN) injection 4 mg  4 mg Intravenous Q6H PRN Doutova, Anastassia, MD      . potassium chloride 10 mEq in 100 mL IVPB  10 mEq Intravenous Q1 Hr x 6 Dwyane Dee, MD 100 mL/hr at 08/21/20 1346 10 mEq at 08/21/20 1346  . sodium chloride flush (NS) 0.9 % injection 3 mL  3 mL Intravenous Q12H Doutova, Anastassia, MD   3 mL at 08/21/20 0055    Allergies as of 08/20/2020 - Review Complete 08/20/2020  Allergen Reaction Noted  . Oxycodone-acetaminophen Nausea Only 06/07/2016  . Poison ivy extract Hives 08/20/2020  . Tyloxapol  08/20/2020    Family History  Problem Relation Age of Onset  . CAD Mother     Social History   Socioeconomic History  . Marital status: Unknown    Spouse name: Not on file  . Number of children: Not on file  . Years of education: Not on file  . Highest education level: Not on file  Occupational History  .  Not on file  Tobacco Use  . Smoking status: Never Smoker  . Smokeless tobacco: Never Used  Substance and Sexual Activity  . Alcohol use: Not Currently  . Drug use: Never  . Sexual activity: Not on file  Other Topics Concern  . Not on file  Social History Narrative  . Not on file   Social Determinants of Health   Financial Resource Strain:   . Difficulty of Paying Living Expenses: Not on file  Food Insecurity:   . Worried About Charity fundraiser in the Last Year: Not on file  . Ran Out of Food in the Last Year: Not on file  Transportation Needs:   . Lack of Transportation (Medical):  Not on file  . Lack of Transportation (Non-Medical): Not on file  Physical Activity:   . Days of Exercise per Week: Not on file  . Minutes of Exercise per Session: Not on file  Stress:   . Feeling of Stress : Not on file  Social Connections:   . Frequency of Communication with Friends and Family: Not on file  . Frequency of Social Gatherings with Friends and Family: Not on file  . Attends Religious Services: Not on file  . Active Member of Clubs or Organizations: Not on file  . Attends Archivist Meetings: Not on file  . Marital Status: Not on file  Intimate Partner Violence:   . Fear of Current or Ex-Partner: Not on file  . Emotionally Abused: Not on file  . Physically Abused: Not on file  . Sexually Abused: Not on file    Review of Systems: See HPI.  Additionally negative for chest pain, shortness of breath, cough, swollen joints, skin rashes, focal neurologic symptoms, sore throat.  Physical Exam: Vital signs in last 24 hours: Temp:  [98 F (36.7 C)-98.3 F (36.8 C)] 98.1 F (36.7 C) (10/03 0551) Pulse Rate:  [57-68] 68 (10/03 0551) Resp:  [12-18] 16 (10/02 2225) BP: (115-134)/(54-80) 132/56 (10/03 0551) SpO2:  [91 %-100 %] 95 % (10/03 0551) Weight:  [68.9 kg] 68.9 kg (10/02 2222) Last BM Date: 08/20/20 General:   Alert,  Well-developed, well-nourished, pleasant and cooperative in NAD, obviously jaundiced Head:  Normocephalic and atraumatic. Eyes:    Conjunctiva pink. Mouth:   No ulcerations or lesions.  Oropharynx pink & moist. Lungs:  Clear throughout to auscultation.   No wheezes, crackles, or rhonchi. No evident respiratory distress. Heart:   Regular rate and rhythm; no murmurs, clicks, rubs,  or gallops. Abdomen:  Soft, nontender, nontympanitic, and nondistended. No masses, hepatosplenomegaly or ventral hernias noted.  Msk:   Symmetrical without gross deformities. Extremities:   Without edema. Neurologic:  Alert and coherent;  grossly normal  neurologically. Skin: Moderate ecchymosis on left lateral thigh.  Severe jaundice. Psych:   Alert and cooperative. Normal mood and affect.  Intake/Output from previous day: 10/02 0701 - 10/03 0700 In: 691.8 [I.V.:159.3; IV Piggyback:100.1] Out: 350 [Urine:350] Intake/Output this shift: No intake/output data recorded.  Lab Results: Recent Labs    08/20/20 1218 08/21/20 0050  WBC 9.1 9.6  HGB 12.4 12.6  HCT 37.0 37.7  PLT 366 380   BMET Recent Labs    08/20/20 1218 08/21/20 0050  NA 132* 134*  K 2.8* 3.0*  CL 92* 96*  CO2 28 25  GLUCOSE 145* 260*  BUN 15 13  CREATININE 0.95 0.69  CALCIUM 9.1 9.1   LFT Recent Labs    08/21/20 0050  PROT 7.5  ALBUMIN  3.7  AST 433*  ALT 470*  ALKPHOS 927*  BILITOT 12.0*   PT/INR Recent Labs    08/20/20 1426  LABPROT 12.2  INR 0.9    Studies/Results: CT ABDOMEN PELVIS W CONTRAST  Result Date: 08/20/2020 CLINICAL DATA:  Jaundice. EXAM: CT ABDOMEN AND PELVIS WITH CONTRAST TECHNIQUE: Multidetector CT imaging of the abdomen and pelvis was performed using the standard protocol following bolus administration of intravenous contrast. CONTRAST:  185m OMNIPAQUE IOHEXOL 300 MG/ML  SOLN COMPARISON:  January 16, 2006. FINDINGS: Lower chest: No acute abnormality. Hepatobiliary: No gallstones are noted. There is moderate intrahepatic and extrahepatic biliary dilatation which appears to be due to 3.3 x 2.2 cm low density mass in the pancreatic head consistent with malignancy. 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 This results in significant pancreatic ductal dilatation. 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 Collateral circulation is noted. Spleen: Normal in size without focal abnormality. Adrenals/Urinary Tract: Adrenal glands are unremarkable. Kidneys are normal, without  renal calculi, focal lesion, or hydronephrosis. Bladder is unremarkable. Stomach/Bowel: The stomach appears normal. There is no evidence of bowel obstruction or inflammation. Vascular/Lymphatic: Aortic atherosclerosis. No enlarged abdominal or pelvic lymph nodes. Reproductive: Probable 2.7 cm uterine fibroid. No adnexal abnormality is noted. Other: No abdominal wall hernia or abnormality. No abdominopelvic ascites. Musculoskeletal: No acute or significant osseous findings. IMPRESSION: 1. 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 Collateral circulation is noted. There is moderate intrahepatic and extrahepatic biliary dilatation which appears to be due to the pancreatic head mass. Pancreatic ductal dilatation is noted as well. 2. Probable 2.7 cm uterine fibroid. 3. Aortic atherosclerosis. Aortic Atherosclerosis (ICD10-I70.0). Electronically Signed   By: JMarijo ConceptionM.D.   On: 08/20/2020 15:04    Impression: 1.  Probable pancreatic cancer with associated obstructive jaundice 2.  Pruritus  Plan: ERCP tomorrow.  The radiographic of a pancreatic mass and biliary obstruction, and possible diagnosis of pancreatic cancer, were discussed with the patient and her husband, who is at the bedside.  They seem to have a good understanding.  I reviewed the nature, purpose, risks, and alternatives of ERCP, sphincterotomy, and stent placement.  I quoted a roughly 1 to 3% risk of pancreatitis, which can occasionally be severe or even fatal.  Also, anesthesia risk, perforation, bleeding, and infection.  She is aware that often, an ERCP will not achieve a tissue diagnosis, in which case further testing, such as endoscopic ultrasound or percutaneous biopsy, would likely be needed.  Time at the bedside today, not counting dictation time or  discussion with my partner who will be doing the procedure, was 40 minutes.     LOS: 1 day   RYoulanda MightyBuccini  08/21/2020, 2:05 PM   Pager [phone] no answer or after 5 PM call [phone]

## 2020-08-21 NOTE — Assessment & Plan Note (Addendum)
-  Resume home clonidine -Also use as needed labetalol or hydralazine

## 2020-08-21 NOTE — Progress Notes (Addendum)
PROGRESS NOTE    Jacqueline Tyler   HBZ:[id number]  DOB: July 05, 1961  DOA: 08/20/2020     1  PCP: Nicola Girt, DO  CC: "yellow skin"  Hospital Course: Jacqueline Tyler is a 59 yo CF with PMH HTN, HLD who presented with jaundice, change in urine and stool color.  She had been in her usual state of health until approximately 3 days prior to admission when she stated that her skin started turning yellow, her urine yellow, and her stool white/gray.  She denied any significant abdominal pain, fevers, chills, night sweats, unintentional weight loss.  She has no known prior history of cancer and denied any family history of cancers.  She underwent work-up in the ER on admission: CBC overall unremarkable Na 132, K 2.8, AST 349, ALT 473, AP 903, TB 7.3 Hepatitis panel negative A1c 7.1% INR 0.9, PT 12.2 Lipase 25  CT abdomen/pelvis was obtained which revealed a 3.3 x 2.2 cm low-density mass in the pancreatic head concerning for malignancy.  She was admitted for further evaluation with GI for ERCP and further work-up.   Interval History:  Seen in room this morning with husband bedside. Discussed CT findings and next steps of ERCP and then possibly needing further procedures and/or surgery.  Patient did not want to entertain idea of cancer at this time however I did state that our workup was pursuing this as a possible diagnosis that was more than likely. She wished to wait until there was a formal diagnosis before considering next steps.  Other than discussing that, she denied any abdominal pain, fevers, sweats, N/V.   Old records reviewed in assessment of this patient  ROS: Constitutional: positive for fatigue, Respiratory: negative for cough, Cardiovascular: negative for chest pain and Gastrointestinal: negative for abdominal pain  Assessment & Plan: * Pancreatic mass -Measures 3.3 x 2.2 cm.  This is concerning for an underlying probable malignancy.  When discussing with patient this morning, she  was in semidenial.  Husband was at bedside. -LFTs are worsening, consistent with obstructive pattern -GI consulted and tentatively planning for ERCP on 08/22/2020.  Patient may still need EUS versus percutaneous biopsy -N.p.o. at midnight -Currently has no pain or significant nausea/vomiting.  Obstructive jaundice - likely due to cancer from pancreatic mass; need to await formal tissue diagnosis if possible - follow up ERCP results and then next steps - patient is grossly jaundiced (labs worse this am compared to admission, TB 7.3>>12  Prerenal azotemia - FeNa = 0.9% consistent with prerenal  - continue IVF  Hyponatremia -Etiology most likely prerenal however could also be contribution of SIADH from underlying malignancy -So far sodium is responding some to IV fluids, will continue fluids and monitor response  Type 2 diabetes mellitus without complication ([place]) -Assuming type II at this time yet etiology possibly adult onset versus some association with underlying pancreatic malignancy -May need further work-up outpatient with endocrinology depending on clinical course -A1c 7.1% -Start on SSI and CBG monitoring for now  Hypokalemia -Replete and recheck as needed  Hypertension - monitor BP, will start meds if elevated   Antimicrobials: none  DVT prophylaxis: SCD Code Status: Full Family Communication: husband bedside Disposition Plan: Status is: Inpatient  Remains inpatient appropriate because:Ongoing diagnostic testing needed not appropriate for outpatient work up, IV treatments appropriate due to intensity of illness or inability to take PO and Inpatient level of care appropriate due to severity of illness   Dispo: The patient is from: Home  Anticipated d/c is to: Home              Anticipated d/c date is: 2 days              Patient currently is not medically stable to d/c.       Objective: Blood pressure (!) 132/56, pulse 68, temperature 98.1 F  (36.7 C), temperature source Oral, resp. rate 16, height 5\' 2"  (1.575 m), weight 68.9 kg, SpO2 95 %.  Examination: General appearance: alert, cooperative, icteric and no distress Head: Normocephalic, without obvious abnormality, atraumatic Eyes: positive findings: sclera icterus Lungs: clear to auscultation bilaterally Heart: regular rate and rhythm and S1, S2 normal Abdomen: normal findings: bowel sounds normal, no masses palpable and soft, non-tender Extremities: no edema Skin: jaundice noted Neurologic: Grossly normal  Consultants:   GI  Procedures:   ERCP, 08/22/20  Data Reviewed: I have personally reviewed following labs and imaging studies Results for orders placed or performed during the hospital encounter of 08/20/20 (from the past 24 hour(s))  Sodium, urine, random     Status: None   Collection Time: 08/21/20 12:24 AM  Result Value Ref Range   Sodium, Ur 95 mmol/L  Creatinine, urine, random     Status: None   Collection Time: 08/21/20 12:24 AM  Result Value Ref Range   Creatinine, Urine 54.08 mg/dL  Osmolality, urine     Status: None   Collection Time: 08/21/20 12:24 AM  Result Value Ref Range   Osmolality, Ur 393 300 - 900 mOsm/kg  HIV Antibody (routine testing w rflx)     Status: None   Collection Time: 08/21/20 12:50 AM  Result Value Ref Range   HIV Screen 4th Generation wRfx Non Reactive Non Reactive  Magnesium     Status: None   Collection Time: 08/21/20 12:50 AM  Result Value Ref Range   Magnesium 2.0 1.7 - 2.4 mg/dL  Phosphorus     Status: None   Collection Time: 08/21/20 12:50 AM  Result Value Ref Range   Phosphorus 4.0 2.5 - 4.6 mg/dL  CBC WITH DIFFERENTIAL     Status: Abnormal   Collection Time: 08/21/20 12:50 AM  Result Value Ref Range   WBC 9.6 4.0 - 10.5 K/uL   RBC 4.61 3.87 - 5.11 MIL/uL   Hemoglobin 12.6 12.0 - 15.0 g/dL   HCT 37.7 36 - 46 %   MCV 81.8 80.0 - 100.0 fL   MCH 27.3 26.0 - 34.0 pg   MCHC 33.4 30.0 - 36.0 g/dL   RDW 16.5 (H)  11.5 - 15.5 %   Platelets 380 150 - 400 K/uL   nRBC 0.0 0.0 - 0.2 %   Neutrophils Relative % 63 %   Neutro Abs 6.0 1.7 - 7.7 K/uL   Lymphocytes Relative 22 %   Lymphs Abs 2.1 0.7 - 4.0 K/uL   Monocytes Relative 9 %   Monocytes Absolute 0.8 0 - 1 K/uL   Eosinophils Relative 4 %   Eosinophils Absolute 0.4 0 - 0 K/uL   Basophils Relative 1 %   Basophils Absolute 0.1 0 - 0 K/uL   Immature Granulocytes 1 %   Abs Immature Granulocytes 0.07 0.00 - 0.07 K/uL  TSH     Status: Abnormal   Collection Time: 08/21/20 12:50 AM  Result Value Ref Range   TSH 9.500 (H) 0.350 - 4.500 uIU/mL  Comprehensive metabolic panel     Status: Abnormal   Collection Time: 08/21/20 12:50 AM  Result Value  Ref Range   Sodium 134 (L) 135 - 145 mmol/L   Potassium 3.0 (L) 3.5 - 5.1 mmol/L   Chloride 96 (L) 98 - 111 mmol/L   CO2 25 22 - 32 mmol/L   Glucose, Bld 260 (H) 70 - 99 mg/dL   BUN 13 6 - 20 mg/dL   Creatinine, Ser 0.69 0.44 - 1.00 mg/dL   Calcium 9.1 8.9 - 10.3 mg/dL   Total Protein 7.5 6.5 - 8.1 g/dL   Albumin 3.7 3.5 - 5.0 g/dL   AST 433 (H) 15 - 41 U/L   ALT 470 (H) 0 - 44 U/L   Alkaline Phosphatase 927 (H) 38 - 126 U/L   Total Bilirubin 12.0 (H) 0.3 - 1.2 mg/dL   GFR calc non Af Amer >60 >60 mL/min   GFR calc Af Amer >60 >60 mL/min   Anion gap 13 5 - 15    Recent Results (from the past 240 hour(s))  Respiratory Panel by RT PCR (Flu A&B, Covid) - Nasopharyngeal Swab     Status: None   Collection Time: 08/20/20  3:40 PM   Specimen: Nasopharyngeal Swab  Result Value Ref Range Status   SARS Coronavirus 2 by RT PCR NEGATIVE NEGATIVE Final    Comment: (NOTE) SARS-CoV-2 target nucleic acids are NOT DETECTED.  The SARS-CoV-2 RNA is generally detectable in upper respiratoy specimens during the acute phase of infection. The lowest concentration of SARS-CoV-2 viral copies this assay can detect is 131 copies/mL. A negative result does not preclude SARS-Cov-2 infection and should not be used as the  sole basis for treatment or other patient management decisions. A negative result may occur with  improper specimen collection/handling, submission of specimen other than nasopharyngeal swab, presence of viral mutation(s) within the areas targeted by this assay, and inadequate number of viral copies (<131 copies/mL). A negative result must be combined with clinical observations, patient history, and epidemiological information. The expected result is Negative.  Fact Sheet for Patients:  PinkCheek.be  Fact Sheet for Healthcare Providers:  GravelBags.it  This test is no t yet approved or cleared by the [place] FDA and  has been authorized for detection and/or diagnosis of SARS-CoV-2 by FDA under an Emergency Use Authorization (EUA). This EUA will remain  in effect (meaning this test can be used) for the duration of the COVID-19 declaration under Section 564(b)(1) of the Act, 21 U.S.C. section 360bbb-3(b)(1), unless the authorization is terminated or revoked sooner.     Influenza A by PCR NEGATIVE NEGATIVE Final   Influenza B by PCR NEGATIVE NEGATIVE Final    Comment: (NOTE) The Xpert Xpress SARS-CoV-2/FLU/RSV assay is intended as an aid in  the diagnosis of influenza from Nasopharyngeal swab specimens and  should not be used as a sole basis for treatment. Nasal washings and  aspirates are unacceptable for Xpert Xpress SARS-CoV-2/FLU/RSV  testing.  Fact Sheet for Patients: PinkCheek.be  Fact Sheet for Healthcare Providers: GravelBags.it  This test is not yet approved or cleared by the [place] FDA and  has been authorized for detection and/or diagnosis of SARS-CoV-2 by  FDA under an Emergency Use Authorization (EUA). This EUA will remain  in effect (meaning this test can be used) for the duration of the  Covid-19 declaration under Section 564(b)(1) of  the Act, 21  U.S.C. section 360bbb-3(b)(1), unless the authorization is  terminated or revoked. Performed at [facility], [address] [place], [place] [address]      Radiology  Studies: CT ABDOMEN PELVIS W CONTRAST  Result Date: 08/20/2020 CLINICAL DATA:  Jaundice. EXAM: CT ABDOMEN AND PELVIS WITH CONTRAST TECHNIQUE: Multidetector CT imaging of the abdomen and pelvis was performed using the standard protocol following bolus administration of intravenous contrast. CONTRAST:  127mL OMNIPAQUE IOHEXOL 300 MG/ML  SOLN COMPARISON:  January 16, 2006. FINDINGS: Lower chest: No acute abnormality. Hepatobiliary: No gallstones are noted. There is moderate intrahepatic and extrahepatic biliary dilatation which appears to be due to 3.3 x 2.2 cm low density mass in the pancreatic head consistent with malignancy. 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 This results in significant pancreatic ductal dilatation. 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 Collateral circulation is noted. Spleen: Normal in size without focal abnormality. Adrenals/Urinary Tract: Adrenal glands are unremarkable. Kidneys are normal, without renal calculi, focal lesion, or hydronephrosis. Bladder is unremarkable. Stomach/Bowel: The stomach appears normal. There is no evidence of bowel obstruction or inflammation. Vascular/Lymphatic: Aortic atherosclerosis. No enlarged abdominal or pelvic lymph nodes. Reproductive: Probable 2.7 cm uterine fibroid. No adnexal abnormality is noted. Other: No abdominal wall hernia or abnormality. No abdominopelvic ascites. Musculoskeletal: No acute or significant osseous findings. IMPRESSION: 1. 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 This mass appears to be leading to occlusion of the  superior mesenteric vein as well as the proximal portion of the main portal vein. Collateral circulation is noted. There is moderate intrahepatic and extrahepatic biliary dilatation which appears to be due to the pancreatic head mass. Pancreatic ductal dilatation is noted as well. 2. Probable 2.7 cm uterine fibroid. 3. Aortic atherosclerosis. Aortic Atherosclerosis (ICD10-I70.0). Electronically Signed   By: Marijo Conception M.D.   On: 08/20/2020 15:04   CT ABDOMEN PELVIS W CONTRAST  Final Result      Scheduled Meds: . citalopram  40 mg Oral Daily  . insulin aspart  0-9 Units Subcutaneous Q6H  . sodium chloride flush  3 mL Intravenous Q12H   PRN Meds: ondansetron **OR** ondansetron (ZOFRAN) IV Continuous Infusions: . sodium chloride 75 mL/hr at 08/21/20 1011  . sodium chloride    . [START ON 08/22/2020] cefTRIAXone (ROCEPHIN)  IV        LOS: 1 day  Time spent: Greater than 50% of the 35 minute visit was spent in counseling/coordination of care for the patient as laid out in the A&P.   Dwyane Dee, MD [facility] 08/21/2020, 4:06 PM  Contact via secure chat.  To contact the attending provider between 7A-7P or the covering provider during after hours 7P-7A, please log into the web site [email or web address] and access using universal [place] password for that web site. If you do not have the password, please call the hospital operator.

## 2020-08-21 NOTE — Assessment & Plan Note (Addendum)
-  Measures 3.3 x 2.2 cm.  This is concerning for an underlying probable malignancy.  When discussing with patient, she remains in denial.  Husband has been present for discussions. -GI consulted.  Patient underwent ERCP on 08/22/2020.  Biliary sphincterotomy performed and brushings were obtained (negative for malignancy but high suspicion clinically still) - EUS planned for 10/6 per GI -Discussed overall plan with patient, she is still not wishing to discuss the "C" word -Currently has no pain or significant nausea/vomiting.

## 2020-08-21 NOTE — Assessment & Plan Note (Signed)
-  Etiology most likely prerenal however could also be contribution of SIADH from underlying malignancy -So far sodium is responding some to IV fluids, will continue fluids and monitor response

## 2020-08-21 NOTE — Assessment & Plan Note (Signed)
-  Replete and recheck as needed 

## 2020-08-21 NOTE — Hospital Course (Addendum)
Ms. Custer is a 59 yo CF with PMH HTN, HLD who presented with jaundice, change in urine and stool color.  She had been in her usual state of health until approximately 3 days prior to admission when she stated that her skin started turning yellow, her urine yellow, and her stool white/gray.  She denied any significant abdominal pain, fevers, chills, night sweats, unintentional weight loss.  She has no known prior history of cancer and denied any family history of cancers.  She underwent work-up in the ER on admission: CBC overall unremarkable Na 132, K 2.8, AST 349, ALT 473, AP 903, TB 7.3 Hepatitis panel negative A1c 7.1% INR 0.9, PT 12.2 Lipase 25  CT abdomen/pelvis was obtained which revealed a 3.3 x 2.2 cm low-density mass in the pancreatic head concerning for malignancy.  She was admitted for further evaluation with GI for ERCP and further work-up. She underwent ERCP on 08/22/2020.  Brushings were taken during procedure and she also underwent biliary sphincterotomy. Cytology was negative on ERCP and GI has recommended EUS given our high suspicion for malignancy. Patient still has some denial underlying but is consenting to procedures and workup.

## 2020-08-21 NOTE — Assessment & Plan Note (Signed)
-  Assuming type II at this time yet etiology possibly adult onset versus some association with underlying pancreatic malignancy -May need further work-up outpatient with endocrinology depending on clinical course -A1c 7.1% -Start on SSI and CBG monitoring for now

## 2020-08-21 NOTE — Assessment & Plan Note (Addendum)
-   likely due to cancer from pancreatic mass; need to await formal tissue diagnosis if possible - LFTs/obstructive pattern has started to improve s/p ERCP with sphincterotomy

## 2020-08-21 NOTE — H&P (View-Only) (Signed)
Referring Provider: [facility] Primary Care Physician:  Nicola Girt, DO Primary Gastroenterologist: Dr. Mitchell Heir [facility], [facility])  Reason for Consultation: Obstructive jaundice with pancreatic mass  HPI: Jacqueline Tyler is a 59 y.o. female admitted from [place] yesterday afternoon because of a several day history of dark urine and jaundice.  Blood work showed significant hyperbilirubinemia, and a CT scan shows a several centimeter pancreatic mass causing obstructive dilatation of the common bile duct.  There is possible involvement of the Superior mesenteric vein and the portal vein.  Interestingly, the patient does not have other prodromal symptoms such as anorexia or weight loss, nor symptoms of cholangitis such as fevers and chills.  The only other symptom she reports is pruritus.  Liver chemistries are pertinent for an admission bilirubin of 7.3, which has climbed to 12.0 overnight, alk phos in the 900 range, transaminases are around 400-500.  INR is normal.     Past Medical History:  Diagnosis Date  . High cholesterol   . Hypertension     History reviewed. No pertinent surgical history.  Prior to Admission medications   Medication Sig Start Date End Date Taking? Authorizing Provider  aspirin 81 MG EC tablet Take 1 tablet by mouth daily. 06/13/17  Yes [provider]  citalopram (CELEXA) 40 MG tablet Take 10 mg by mouth daily.  08/08/20  Yes [provider]  cloNIDine (CATAPRES) 0.1 MG tablet Take 0.1 mg by mouth at bedtime. 07/25/20  Yes [provider]  Famotidine (PEPCID PO) Take 1 tablet by mouth daily as needed (acid reflux).   Yes [provider]  hydrochlorothiazide (HYDRODIURIL) 12.5 MG tablet Take 12.5 mg by mouth daily. 07/25/20  Yes [provider]  simvastatin (ZOCOR) 80 MG tablet Take 80 mg by mouth daily. 07/25/20  Yes [provider]    Current Facility-Administered Medications  Medication Dose  Route Frequency Provider Last Rate Last Admin  . 0.9 %  sodium chloride infusion   Intravenous Continuous Dwyane Dee, MD 75 mL/hr at 08/21/20 1011 New Bag at 08/21/20 1011  . 0.9 %  sodium chloride infusion   Intravenous Continuous Ascencion Stegner, MD      . citalopram (CELEXA) tablet 40 mg  40 mg Oral Daily Doutova, Anastassia, MD   40 mg at 08/21/20 1009  . ondansetron (ZOFRAN) tablet 4 mg  4 mg Oral Q6H PRN Toy Baker, MD       Or  . ondansetron (ZOFRAN) injection 4 mg  4 mg Intravenous Q6H PRN Doutova, Anastassia, MD      . potassium chloride 10 mEq in 100 mL IVPB  10 mEq Intravenous Q1 Hr x 6 Dwyane Dee, MD 100 mL/hr at 08/21/20 1346 10 mEq at 08/21/20 1346  . sodium chloride flush (NS) 0.9 % injection 3 mL  3 mL Intravenous Q12H Doutova, Anastassia, MD   3 mL at 08/21/20 0055    Allergies as of 08/20/2020 - Review Complete 08/20/2020  Allergen Reaction Noted  . Oxycodone-acetaminophen Nausea Only 06/07/2016  . Poison ivy extract Hives 08/20/2020  . Tyloxapol  08/20/2020    Family History  Problem Relation Age of Onset  . CAD Mother     Social History   Socioeconomic History  . Marital status: Unknown    Spouse name: Not on file  . Number of children: Not on file  . Years of education: Not on file  . Highest education level: Not on file  Occupational History  .  Not on file  Tobacco Use  . Smoking status: Never Smoker  . Smokeless tobacco: Never Used  Substance and Sexual Activity  . Alcohol use: Not Currently  . Drug use: Never  . Sexual activity: Not on file  Other Topics Concern  . Not on file  Social History Narrative  . Not on file   Social Determinants of Health   Financial Resource Strain:   . Difficulty of Paying Living Expenses: Not on file  Food Insecurity:   . Worried About Charity fundraiser in the Last Year: Not on file  . Ran Out of Food in the Last Year: Not on file  Transportation Needs:   . Lack of Transportation (Medical):  Not on file  . Lack of Transportation (Non-Medical): Not on file  Physical Activity:   . Days of Exercise per Week: Not on file  . Minutes of Exercise per Session: Not on file  Stress:   . Feeling of Stress : Not on file  Social Connections:   . Frequency of Communication with Friends and Family: Not on file  . Frequency of Social Gatherings with Friends and Family: Not on file  . Attends Religious Services: Not on file  . Active Member of Clubs or Organizations: Not on file  . Attends Archivist Meetings: Not on file  . Marital Status: Not on file  Intimate Partner Violence:   . Fear of Current or Ex-Partner: Not on file  . Emotionally Abused: Not on file  . Physically Abused: Not on file  . Sexually Abused: Not on file    Review of Systems: See HPI.  Additionally negative for chest pain, shortness of breath, cough, swollen joints, skin rashes, focal neurologic symptoms, sore throat.  Physical Exam: Vital signs in last 24 hours: Temp:  [98 F (36.7 C)-98.3 F (36.8 C)] 98.1 F (36.7 C) (10/03 0551) Pulse Rate:  [57-68] 68 (10/03 0551) Resp:  [12-18] 16 (10/02 2225) BP: (115-134)/(54-80) 132/56 (10/03 0551) SpO2:  [91 %-100 %] 95 % (10/03 0551) Weight:  [68.9 kg] 68.9 kg (10/02 2222) Last BM Date: 08/20/20 General:   Alert,  Well-developed, well-nourished, pleasant and cooperative in NAD, obviously jaundiced Head:  Normocephalic and atraumatic. Eyes:    Conjunctiva pink. Mouth:   No ulcerations or lesions.  Oropharynx pink & moist. Lungs:  Clear throughout to auscultation.   No wheezes, crackles, or rhonchi. No evident respiratory distress. Heart:   Regular rate and rhythm; no murmurs, clicks, rubs,  or gallops. Abdomen:  Soft, nontender, nontympanitic, and nondistended. No masses, hepatosplenomegaly or ventral hernias noted.  Msk:   Symmetrical without gross deformities. Extremities:   Without edema. Neurologic:  Alert and coherent;  grossly normal  neurologically. Skin: Moderate ecchymosis on left lateral thigh.  Severe jaundice. Psych:   Alert and cooperative. Normal mood and affect.  Intake/Output from previous day: 10/02 0701 - 10/03 0700 In: 691.8 [I.V.:159.3; IV Piggyback:100.1] Out: 350 [Urine:350] Intake/Output this shift: No intake/output data recorded.  Lab Results: Recent Labs    08/20/20 1218 08/21/20 0050  WBC 9.1 9.6  HGB 12.4 12.6  HCT 37.0 37.7  PLT 366 380   BMET Recent Labs    08/20/20 1218 08/21/20 0050  NA 132* 134*  K 2.8* 3.0*  CL 92* 96*  CO2 28 25  GLUCOSE 145* 260*  BUN 15 13  CREATININE 0.95 0.69  CALCIUM 9.1 9.1   LFT Recent Labs    08/21/20 0050  PROT 7.5  ALBUMIN  3.7  AST 433*  ALT 470*  ALKPHOS 927*  BILITOT 12.0*   PT/INR Recent Labs    08/20/20 1426  LABPROT 12.2  INR 0.9    Studies/Results: CT ABDOMEN PELVIS W CONTRAST  Result Date: 08/20/2020 CLINICAL DATA:  Jaundice. EXAM: CT ABDOMEN AND PELVIS WITH CONTRAST TECHNIQUE: Multidetector CT imaging of the abdomen and pelvis was performed using the standard protocol following bolus administration of intravenous contrast. CONTRAST:  185m OMNIPAQUE IOHEXOL 300 MG/ML  SOLN COMPARISON:  January 16, 2006. FINDINGS: Lower chest: No acute abnormality. Hepatobiliary: No gallstones are noted. There is moderate intrahepatic and extrahepatic biliary dilatation which appears to be due to 3.3 x 2.2 cm low density mass in the pancreatic head consistent with malignancy. 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 This results in significant pancreatic ductal dilatation. 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 Collateral circulation is noted. Spleen: Normal in size without focal abnormality. Adrenals/Urinary Tract: Adrenal glands are unremarkable. Kidneys are normal, without  renal calculi, focal lesion, or hydronephrosis. Bladder is unremarkable. Stomach/Bowel: The stomach appears normal. There is no evidence of bowel obstruction or inflammation. Vascular/Lymphatic: Aortic atherosclerosis. No enlarged abdominal or pelvic lymph nodes. Reproductive: Probable 2.7 cm uterine fibroid. No adnexal abnormality is noted. Other: No abdominal wall hernia or abnormality. No abdominopelvic ascites. Musculoskeletal: No acute or significant osseous findings. IMPRESSION: 1. 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 Collateral circulation is noted. There is moderate intrahepatic and extrahepatic biliary dilatation which appears to be due to the pancreatic head mass. Pancreatic ductal dilatation is noted as well. 2. Probable 2.7 cm uterine fibroid. 3. Aortic atherosclerosis. Aortic Atherosclerosis (ICD10-I70.0). Electronically Signed   By: JMarijo ConceptionM.D.   On: 08/20/2020 15:04    Impression: 1.  Probable pancreatic cancer with associated obstructive jaundice 2.  Pruritus  Plan: ERCP tomorrow.  The radiographic of a pancreatic mass and biliary obstruction, and possible diagnosis of pancreatic cancer, were discussed with the patient and her husband, who is at the bedside.  They seem to have a good understanding.  I reviewed the nature, purpose, risks, and alternatives of ERCP, sphincterotomy, and stent placement.  I quoted a roughly 1 to 3% risk of pancreatitis, which can occasionally be severe or even fatal.  Also, anesthesia risk, perforation, bleeding, and infection.  She is aware that often, an ERCP will not achieve a tissue diagnosis, in which case further testing, such as endoscopic ultrasound or percutaneous biopsy, would likely be needed.  Time at the bedside today, not counting dictation time or  discussion with my partner who will be doing the procedure, was 40 minutes.     LOS: 1 day   RYoulanda MightyBuccini  08/21/2020, 2:05 PM   Pager [phone] no answer or after 5 PM call [phone]

## 2020-08-22 ENCOUNTER — Encounter (HOSPITAL_COMMUNITY): Payer: Self-pay | Admitting: Internal Medicine

## 2020-08-22 ENCOUNTER — Inpatient Hospital Stay (HOSPITAL_COMMUNITY): Payer: 59

## 2020-08-22 ENCOUNTER — Inpatient Hospital Stay (HOSPITAL_COMMUNITY): Payer: 59 | Admitting: Anesthesiology

## 2020-08-22 ENCOUNTER — Encounter (HOSPITAL_COMMUNITY)
Admission: EM | Disposition: A | Discharge status: Home / Self Care | Payer: Self-pay | Source: Home / Self Care | Attending: Internal Medicine

## 2020-08-22 HISTORY — PX: BILIARY STENT PLACEMENT: SHX5538

## 2020-08-22 HISTORY — PX: SPHINCTEROTOMY: SHX5279

## 2020-08-22 HISTORY — PX: ENDOSCOPIC RETROGRADE CHOLANGIOPANCREATOGRAPHY (ERCP) WITH PROPOFOL: SHX5810

## 2020-08-22 HISTORY — PX: BILIARY BRUSHING: SHX6843

## 2020-08-22 LAB — COMPREHENSIVE METABOLIC PANEL
ALT: 439 U/L — ABNORMAL HIGH (ref 0–44)
AST: 426 U/L — ABNORMAL HIGH (ref 15–41)
Albumin: 3.1 g/dL — ABNORMAL LOW (ref 3.5–5.0)
Alkaline Phosphatase: 837 U/L — ABNORMAL HIGH (ref 38–126)
Anion gap: 9 (ref 5–15)
BUN: 16 mg/dL (ref 6–20)
CO2: 22 mmol/L (ref 22–32)
Calcium: 8.7 mg/dL — ABNORMAL LOW (ref 8.9–10.3)
Chloride: 106 mmol/L (ref 98–111)
Creatinine, Ser: 0.69 mg/dL (ref 0.44–1.00)
GFR calc Af Amer: >60 mL/min (ref >60)
GFR calc non Af Amer: >60 mL/min (ref >60)
Glucose, Bld: 138 mg/dL — ABNORMAL HIGH (ref 70–99)
Potassium: 3.8 mmol/L (ref 3.5–5.1)
Sodium: 137 mmol/L (ref 135–145)
Total Bilirubin: 13 mg/dL — ABNORMAL HIGH (ref 0.3–1.2)
Total Protein: 6.5 g/dL (ref 6.5–8.1)

## 2020-08-22 LAB — CBC WITH DIFFERENTIAL/PLATELET
Abs Immature Granulocytes: 0.07 10*3/uL (ref 0.00–0.07)
Basophils Absolute: 0.1 10*3/uL (ref 0.0–0.1)
Basophils Relative: 1 %
Eosinophils Absolute: 0.4 10*3/uL (ref 0.0–0.5)
Eosinophils Relative: 5 %
HCT: 35.2 % — ABNORMAL LOW (ref 36.0–46.0)
Hemoglobin: 11.4 g/dL — ABNORMAL LOW (ref 12.0–15.0)
Immature Granulocytes: 1 %
Lymphocytes Relative: 24 %
Lymphs Abs: 2.1 10*3/uL (ref 0.7–4.0)
MCH: 27.1 pg (ref 26.0–34.0)
MCHC: 32.4 g/dL (ref 30.0–36.0)
MCV: 83.8 fL (ref 80.0–100.0)
Monocytes Absolute: 0.9 10*3/uL (ref 0.1–1.0)
Monocytes Relative: 11 %
Neutro Abs: 5.3 10*3/uL (ref 1.7–7.7)
Neutrophils Relative %: 58 %
Platelets: 339 10*3/uL (ref 150–400)
RBC: 4.2 MIL/uL (ref 3.87–5.11)
RDW: 17.3 % — ABNORMAL HIGH (ref 11.5–15.5)
WBC: 8.9 10*3/uL (ref 4.0–10.5)
nRBC: 0 % (ref 0.0–0.2)

## 2020-08-22 LAB — MAGNESIUM: Magnesium: 2.1 mg/dL (ref 1.7–2.4)

## 2020-08-22 LAB — GLUCOSE, CAPILLARY
Glucose-Capillary: 160 mg/dL — ABNORMAL HIGH (ref 70–99)
Glucose-Capillary: 141 mg/dL — ABNORMAL HIGH (ref 70–99)
Glucose-Capillary: 157 mg/dL — ABNORMAL HIGH (ref 70–99)
Glucose-Capillary: 216 mg/dL — ABNORMAL HIGH (ref 70–99)

## 2020-08-22 LAB — CANCER ANTIGEN 19-9: CA 19-9: 927 U/mL — ABNORMAL HIGH (ref 0–35)

## 2020-08-22 LAB — T4, FREE: Free T4: 0.91 ng/dL (ref 0.61–1.12)

## 2020-08-22 SURGERY — ENDOSCOPIC RETROGRADE CHOLANGIOPANCREATOGRAPHY (ERCP) WITH PROPOFOL
Anesthesia: General

## 2020-08-22 MED ORDER — FENTANYL CITRATE (PF) 100 MCG/2ML IJ SOLN: 25.0000 ug | INTRAMUSCULAR | Status: DC | PRN

## 2020-08-22 MED ORDER — HYDRALAZINE HCL 25 MG PO TABS: 25.0000 mg | ORAL_TABLET | ORAL | Status: DC | PRN

## 2020-08-22 MED ORDER — INDOMETHACIN 50 MG RE SUPP
RECTAL | Status: AC
  Filled 2020-08-22: qty 2

## 2020-08-22 MED ORDER — GLUCAGON HCL RDNA (DIAGNOSTIC) 1 MG IJ SOLR
INTRAMUSCULAR | Status: AC
  Filled 2020-08-22: qty 1

## 2020-08-22 MED ORDER — CLONIDINE HCL 0.1 MG PO TABS
0.1000 mg | ORAL_TABLET | Freq: Every day | ORAL | Status: DC
  Administered 2020-08-22 – 2020-08-23 (×2): 0.1 mg via ORAL
  Filled 2020-08-22 (×2): qty 1

## 2020-08-22 MED ORDER — INDOMETHACIN 50 MG RE SUPP
RECTAL | Status: DC | PRN
  Administered 2020-08-22: 100 mg via RECTAL

## 2020-08-22 MED ORDER — FENTANYL CITRATE (PF) 100 MCG/2ML IJ SOLN
INTRAMUSCULAR | Status: AC
  Filled 2020-08-22: qty 2

## 2020-08-22 MED ORDER — DEXAMETHASONE SODIUM PHOSPHATE 10 MG/ML IJ SOLN
INTRAMUSCULAR | Status: DC | PRN
  Administered 2020-08-22: 4 mg via INTRAVENOUS

## 2020-08-22 MED ORDER — SODIUM CHLORIDE 0.9 % IV SOLN
INTRAVENOUS | Status: DC | PRN
  Administered 2020-08-22: 30 mL

## 2020-08-22 MED ORDER — ROCURONIUM BROMIDE 10 MG/ML (PF) SYRINGE
PREFILLED_SYRINGE | INTRAVENOUS | Status: DC | PRN
  Administered 2020-08-22: 60 mg via INTRAVENOUS

## 2020-08-22 MED ORDER — SUGAMMADEX SODIUM 200 MG/2ML IV SOLN
INTRAVENOUS | Status: DC | PRN
  Administered 2020-08-22: 280 mg via INTRAVENOUS

## 2020-08-22 MED ORDER — FENTANYL CITRATE (PF) 100 MCG/2ML IJ SOLN
INTRAMUSCULAR | Status: DC | PRN
  Administered 2020-08-22 (×2): 50 ug via INTRAVENOUS

## 2020-08-22 MED ORDER — CIPROFLOXACIN IN D5W 400 MG/200ML IV SOLN
INTRAVENOUS | Status: AC
  Filled 2020-08-22: qty 200

## 2020-08-22 MED ORDER — LIDOCAINE 2% (20 MG/ML) 5 ML SYRINGE
INTRAMUSCULAR | Status: DC | PRN
  Administered 2020-08-22: 50 mg via INTRAVENOUS

## 2020-08-22 MED ORDER — LACTATED RINGERS IV SOLN: INTRAVENOUS | Status: DC

## 2020-08-22 MED ORDER — ONDANSETRON HCL 4 MG/2ML IJ SOLN
INTRAMUSCULAR | Status: DC | PRN
  Administered 2020-08-22: 4 mg via INTRAVENOUS

## 2020-08-22 MED ORDER — PROPOFOL 10 MG/ML IV BOLUS
INTRAVENOUS | Status: AC
  Filled 2020-08-22: qty 20

## 2020-08-22 MED ORDER — PROPOFOL 10 MG/ML IV BOLUS
INTRAVENOUS | Status: DC | PRN
  Administered 2020-08-22: 180 mg via INTRAVENOUS

## 2020-08-22 MED ORDER — LABETALOL HCL 5 MG/ML IV SOLN
10.0000 mg | INTRAVENOUS | Status: DC | PRN
  Filled 2020-08-22: qty 4

## 2020-08-22 NOTE — Progress Notes (Signed)
Jacqueline Tyler 12:16 PM  Subjective: Patient seen and examined and discussed with my partner Dr. Cristina Gong in her hospital computer chart reviewed and currently she has no new complaints  Objective: Vital signs stable afebrile exam please see preassessment evaluation increased liver test CBC okay CT reviewed Assessment: Obstructive jaundice pancreatic head mass  Plan: We rediscussed ERCP including the success rate and will proceed with anesthesia assistance with further work-up and plans pending those findings and if brushings negative will probably need EUS and biopsy for diagnosis but if positive will probably need chemoradiation first prior to possible surgical options and can probably hold off on EUS in that scenario  [facility]  office [phone] After 5PM or if no answer call [phone]

## 2020-08-22 NOTE — Anesthesia Postprocedure Evaluation (Signed)
Anesthesia Post Note  Patient: Jacqueline Tyler  Procedure(s) Performed: ENDOSCOPIC RETROGRADE CHOLANGIOPANCREATOGRAPHY (ERCP) WITH PROPOFOL (N/A ) BILIARY BRUSHING SPHINCTEROTOMY BILIARY STENT PLACEMENT (N/A )     Patient location during evaluation: Endoscopy Anesthesia Type: General Level of consciousness: awake and sedated Pain management: pain level controlled Respiratory status: spontaneous breathing Cardiovascular status: stable Postop Assessment: no apparent nausea or vomiting Anesthetic complications: no   No complications documented.  Last Vitals:  Vitals:   08/22/20 1345 08/22/20 1355  BP: (!) 183/85 (!) 156/60  Pulse:  80  Resp:  14  Temp:    SpO2: 100% 98%    Last Pain:  Vitals:   08/22/20 1345  TempSrc:   PainSc: 0-No pain                 Huston Foley

## 2020-08-22 NOTE — Anesthesia Preprocedure Evaluation (Addendum)
Anesthesia Evaluation  Patient identified by MRN, date of birth, ID band Patient awake    Reviewed: Allergy & Precautions, NPO status , Patient's Chart, lab work & pertinent test results  Airway Mallampati: III  TM Distance: <3 FB Neck ROM: Full    Dental no notable dental hx.    Pulmonary neg pulmonary ROS,    Pulmonary exam normal breath sounds clear to auscultation       Cardiovascular hypertension, Pt. on medications Normal cardiovascular exam Rhythm:Regular Rate:Normal     Neuro/Psych negative neurological ROS  negative psych ROS   GI/Hepatic Neg liver ROS, Acute pancreatic obstruction   Endo/Other  diabetesNew onset secondary to pancreatic obstruction  Renal/GU negative Renal ROS  negative genitourinary   Musculoskeletal negative musculoskeletal ROS (+)   Abdominal   Peds negative pediatric ROS (+)  Hematology negative hematology ROS (+)   Anesthesia Other Findings   Reproductive/Obstetrics negative OB ROS                            Anesthesia Physical Anesthesia Plan  ASA: III  Anesthesia Plan: General   Post-op Pain Management:    Induction: Intravenous  PONV Risk Score and Plan: 3 and Ondansetron, Dexamethasone and Treatment may vary due to age or medical condition  Airway Management Planned: Oral ETT  Additional Equipment:   Intra-op Plan:   Post-operative Plan: Extubation in OR  Informed Consent: I have reviewed the patients History and Physical, chart, labs and discussed the procedure including the risks, benefits and alternatives for the proposed anesthesia with the patient or authorized representative who has indicated his/her understanding and acceptance.     Dental advisory given  Plan Discussed with: CRNA and Surgeon  Anesthesia Plan Comments:         Anesthesia Quick Evaluation

## 2020-08-22 NOTE — Anesthesia Preprocedure Evaluation (Deleted)
Anesthesia Evaluation  Patient identified by MRN, date of birth, ID band Patient awake    Reviewed: Allergy & Precautions, NPO status , Patient's Chart, lab work & pertinent test results  Airway Mallampati: II  TM Distance: >3 FB Neck ROM: Full    Dental no notable dental hx.    Pulmonary neg pulmonary ROS,    Pulmonary exam normal breath sounds clear to auscultation       Cardiovascular hypertension, Pt. 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  Renal/GU negative Renal ROS  negative genitourinary   Musculoskeletal negative musculoskeletal ROS (+)   Abdominal   Peds negative pediatric ROS (+)  Hematology negative hematology ROS (+)   Anesthesia Other Findings   Reproductive/Obstetrics negative OB ROS                             Anesthesia Physical Anesthesia Plan  ASA: III  Anesthesia Plan: MAC   Post-op Pain Management:    Induction: Intravenous  PONV Risk Score and Plan: 0 and Propofol infusion  Airway Management Planned: Simple Face Mask  Additional Equipment:   Intra-op Plan:   Post-operative Plan:   Informed Consent: I have reviewed the patients History and Physical, chart, labs and discussed the procedure including the risks, benefits and alternatives for the proposed anesthesia with the patient or authorized representative who has indicated his/her understanding and acceptance.     Dental advisory given  Plan Discussed with: CRNA and Surgeon  Anesthesia Plan Comments:         Anesthesia Quick Evaluation

## 2020-08-22 NOTE — Plan of Care (Signed)
Pt NPO overnight and awaiting ERCP this am.  No complaints at this time.   Problem: Education: Goal: Knowledge of General Education information will improve Description: Including pain rating scale, medication(s)/side effects and non-pharmacologic comfort measures Outcome: Progressing   Problem: Health Behavior/Discharge Planning: Goal: Ability to manage health-related needs will improve Outcome: Progressing   Problem: Clinical Measurements: Goal: Ability to maintain clinical measurements within normal limits will improve Outcome: Progressing Goal: Will remain free from infection Outcome: Progressing Goal: Diagnostic test results will improve Outcome: Progressing Goal: Respiratory complications will improve Outcome: Progressing Goal: Cardiovascular complication will be avoided Outcome: Progressing   Problem: Activity: Goal: Risk for activity intolerance will decrease Outcome: Progressing   Problem: Coping: Goal: Level of anxiety will decrease Outcome: Progressing   Problem: Elimination: Goal: Will not experience complications related to bowel motility Outcome: Progressing Goal: Will not experience complications related to urinary retention Outcome: Progressing   Problem: Pain Managment: Goal: General experience of comfort will improve Outcome: Progressing   Problem: Safety: Goal: Ability to remain free from injury will improve Outcome: Progressing   Problem: Skin Integrity: Goal: Risk for impaired skin integrity will decrease Outcome: Progressing

## 2020-08-22 NOTE — Transfer of Care (Signed)
Immediate Anesthesia Transfer of Care Note  Patient: MCKENLEE MANGHAM  Procedure(s) Performed: ENDOSCOPIC RETROGRADE CHOLANGIOPANCREATOGRAPHY (ERCP) WITH PROPOFOL (N/A ) BILIARY BRUSHING SPHINCTEROTOMY BILIARY STENT PLACEMENT (N/A )  Patient Location: PACU  Anesthesia Type:General  Level of Consciousness: awake, alert  and oriented  Airway & Oxygen Therapy: Patient Spontanous Breathing and Patient connected to face mask oxygen  Post-op Assessment: Report given to RN, Post -op Vital signs reviewed and stable and Patient moving all extremities X 4  Post vital signs: Reviewed and stable  Last Vitals:  Vitals Value Taken Time  BP    Temp    Pulse    Resp    SpO2      Last Pain:  Vitals:   08/22/20 1109  TempSrc: Oral  PainSc:          Complications: No complications documented.

## 2020-08-22 NOTE — Progress Notes (Signed)
PROGRESS NOTE    Jacqueline Tyler   VOZ:[id number]  DOB: [birth year]  DOA: 08/20/2020     2  PCP: Nicola Girt, DO  CC: "yellow skin"  Hospital Course: Jacqueline Tyler is a 59 yo CF with PMH HTN, HLD who presented with jaundice, change in urine and stool color.  She had been in her usual state of health until approximately 3 days prior to admission when she stated that her skin started turning yellow, her urine yellow, and her stool white/gray.  She denied any significant abdominal pain, fevers, chills, night sweats, unintentional weight loss.  She has no known prior history of cancer and denied any family history of cancers.  She underwent work-up in the ER on admission: CBC overall unremarkable Na 132, K 2.8, AST 349, ALT 473, AP 903, TB 7.3 Hepatitis panel negative A1c 7.1% INR 0.9, PT 12.2 Lipase 25  CT abdomen/pelvis was obtained which revealed a 3.3 x 2.2 cm low-density mass in the pancreatic head concerning for malignancy.  She was admitted for further evaluation with GI for ERCP and further work-up. She underwent ERCP on 08/22/2020.  Brushings were taken during procedure and she also underwent biliary sphincterotomy.  Interval History:  Seen in room after ERCP. Ordering CLD. Feeling okay. Still did not want to discuss "C" word.   Old records reviewed in assessment of this patient  ROS: Constitutional: positive for fatigue, Respiratory: negative for cough, Cardiovascular: negative for chest pain and Gastrointestinal: negative for abdominal pain  Assessment & Plan: * Pancreatic mass -Measures 3.3 x 2.2 cm.  This is concerning for an underlying probable malignancy.  When discussing with patient this morning, she was in semidenial.  Husband was at bedside. -LFTs are worsening, consistent with obstructive pattern -GI consulted.  Patient underwent ERCP on 08/22/2020.  Biliary sphincterotomy performed and brushings were obtained -GI recommendation is for EUS or percutaneous biopsy if  brushings are negative or inconclusive -Discussed overall plan with patient, she is still not wishing to discuss the "C" word -Currently has no pain or significant nausea/vomiting. - CLD after ERCP; probable advancement more tomorrow   Obstructive jaundice - likely due to cancer from pancreatic mass; need to await formal tissue diagnosis if possible - follow up ERCP results and then next steps - patient is grossly jaundiced (TB 7.3>>12>>13)  Prerenal azotemia-resolved as of 08/22/2020 - FeNa = 0.9% consistent with prerenal  - continue IVF  Hyponatremia-resolved as of 08/22/2020 -Etiology most likely prerenal however could also be contribution of SIADH from underlying malignancy -So far sodium is responding some to IV fluids, will continue fluids and monitor response  Type 2 diabetes mellitus without complication ([place]) -Assuming type II at this time yet etiology possibly adult onset versus some association with underlying pancreatic malignancy -May need further work-up outpatient with endocrinology depending on clinical course -A1c 7.1% -Start on SSI and CBG monitoring for now  Hypokalemia -Replete and recheck as needed  Hypertension -Resume home clonidine -Also use as needed labetalol or hydralazine   Antimicrobials: none  DVT prophylaxis: SCD Code Status: Full Family Communication: husband bedside Disposition Plan: Status is: Inpatient  Remains inpatient appropriate because:Ongoing diagnostic testing needed not appropriate for outpatient work up, IV treatments appropriate due to intensity of illness or inability to take PO and Inpatient level of care appropriate due to severity of illness   Dispo: The patient is from: Home              Anticipated d/c is to: Home  Anticipated d/c date is: 2 days              Patient currently is not medically stable to d/c.  Objective: Blood pressure (!) 155/81, pulse (!) 59, temperature 97.9 F (36.6 C), temperature  source Oral, resp. rate 16, height 5\' 2"  (1.575 m), weight 68.9 kg, SpO2 97 %.  Examination: General appearance: alert, cooperative, icteric and no distress Head: Normocephalic, without obvious abnormality, atraumatic Eyes: positive findings: sclera icterus Lungs: clear to auscultation bilaterally Heart: regular rate and rhythm and S1, S2 normal Abdomen: normal findings: bowel sounds normal, no masses palpable and soft, non-tender Extremities: no edema Skin: jaundice noted Neurologic: Grossly normal  Consultants:   GI  Procedures:   ERCP, 08/22/20  Data Reviewed: I have personally reviewed following labs and imaging studies Results for orders placed or performed during the hospital encounter of 08/20/20 (from the past 24 hour(s))  T4, free     Status: None   Collection Time: 08/21/20  5:09 PM  Result Value Ref Range   Free T4 0.91 0.61 - 1.12 ng/dL  Glucose, capillary     Status: Abnormal   Collection Time: 08/21/20  6:47 PM  Result Value Ref Range   Glucose-Capillary 220 (H) 70 - 99 mg/dL  Glucose, capillary     Status: Abnormal   Collection Time: 08/22/20  2:24 AM  Result Value Ref Range   Glucose-Capillary 160 (H) 70 - 99 mg/dL  CBC with Differential/Platelet     Status: Abnormal   Collection Time: 08/22/20  6:20 AM  Result Value Ref Range   WBC 8.9 4.0 - 10.5 K/uL   RBC 4.20 3.87 - 5.11 MIL/uL   Hemoglobin 11.4 (L) 12.0 - 15.0 g/dL   HCT 35.2 (L) 36 - 46 %   MCV 83.8 80.0 - 100.0 fL   MCH 27.1 26.0 - 34.0 pg   MCHC 32.4 30.0 - 36.0 g/dL   RDW 17.3 (H) 11.5 - 15.5 %   Platelets 339 150 - 400 K/uL   nRBC 0.0 0.0 - 0.2 %   Neutrophils Relative % 58 %   Neutro Abs 5.3 1.7 - 7.7 K/uL   Lymphocytes Relative 24 %   Lymphs Abs 2.1 0.7 - 4.0 K/uL   Monocytes Relative 11 %   Monocytes Absolute 0.9 0 - 1 K/uL   Eosinophils Relative 5 %   Eosinophils Absolute 0.4 0 - 0 K/uL   Basophils Relative 1 %   Basophils Absolute 0.1 0 - 0 K/uL   Immature Granulocytes 1 %   Abs  Immature Granulocytes 0.07 0.00 - 0.07 K/uL  Comprehensive metabolic panel     Status: Abnormal   Collection Time: 08/22/20  6:20 AM  Result Value Ref Range   Sodium 137 135 - 145 mmol/L   Potassium 3.8 3.5 - 5.1 mmol/L   Chloride 106 98 - 111 mmol/L   CO2 22 22 - 32 mmol/L   Glucose, Bld 138 (H) 70 - 99 mg/dL   BUN 16 6 - 20 mg/dL   Creatinine, Ser 0.69 0.44 - 1.00 mg/dL   Calcium 8.7 (L) 8.9 - 10.3 mg/dL   Total Protein 6.5 6.5 - 8.1 g/dL   Albumin 3.1 (L) 3.5 - 5.0 g/dL   AST 426 (H) 15 - 41 U/L   ALT 439 (H) 0 - 44 U/L   Alkaline Phosphatase 837 (H) 38 - 126 U/L   Total Bilirubin 13.0 (H) 0.3 - 1.2 mg/dL   GFR calc non Af  Amer >60 >60 mL/min   GFR calc Af Amer >60 >60 mL/min   Anion gap 9 5 - 15  Magnesium     Status: None   Collection Time: 08/22/20  6:20 AM  Result Value Ref Range   Magnesium 2.1 1.7 - 2.4 mg/dL  Glucose, capillary     Status: Abnormal   Collection Time: 08/22/20  6:55 AM  Result Value Ref Range   Glucose-Capillary 141 (H) 70 - 99 mg/dL    Recent Results (from the past 240 hour(s))  Respiratory Panel by RT PCR (Flu A&B, Covid) - Nasopharyngeal Swab     Status: None   Collection Time: 08/20/20  3:40 PM   Specimen: Nasopharyngeal Swab  Result Value Ref Range Status   SARS Coronavirus 2 by RT PCR NEGATIVE NEGATIVE Final    Comment: (NOTE) SARS-CoV-2 target nucleic acids are NOT DETECTED.  The SARS-CoV-2 RNA is generally detectable in upper respiratoy specimens during the acute phase of infection. The lowest concentration of SARS-CoV-2 viral copies this assay can detect is 131 copies/mL. A negative result does not preclude SARS-Cov-2 infection and should not be used as the sole basis for treatment or other patient management decisions. A negative result may occur with  improper specimen collection/handling, submission of specimen other than nasopharyngeal swab, presence of viral mutation(s) within the areas targeted by this assay, and inadequate  number of viral copies (<131 copies/mL). A negative result must be combined with clinical observations, patient history, and epidemiological information. The expected result is Negative.  Fact Sheet for Patients:  PinkCheek.be  Fact Sheet for Healthcare Providers:  GravelBags.it  This test is no t yet approved or cleared by the [place] FDA and  has been authorized for detection and/or diagnosis of SARS-CoV-2 by FDA under an Emergency Use Authorization (EUA). This EUA will remain  in effect (meaning this test can be used) for the duration of the COVID-19 declaration under Section 564(b)(1) of the Act, 21 U.S.C. section 360bbb-3(b)(1), unless the authorization is terminated or revoked sooner.     Influenza A by PCR NEGATIVE NEGATIVE Final   Influenza B by PCR NEGATIVE NEGATIVE Final    Comment: (NOTE) The Xpert Xpress SARS-CoV-2/FLU/RSV assay is intended as an aid in  the diagnosis of influenza from Nasopharyngeal swab specimens and  should not be used as a sole basis for treatment. Nasal washings and  aspirates are unacceptable for Xpert Xpress SARS-CoV-2/FLU/RSV  testing.  Fact Sheet for Patients: PinkCheek.be  Fact Sheet for Healthcare Providers: GravelBags.it  This test is not yet approved or cleared by the [place] FDA and  has been authorized for detection and/or diagnosis of SARS-CoV-2 by  FDA under an Emergency Use Authorization (EUA). This EUA will remain  in effect (meaning this test can be used) for the duration of the  Covid-19 declaration under Section 564(b)(1) of the Act, 21  U.S.C. section 360bbb-3(b)(1), unless the authorization is  terminated or revoked. Performed at [facility], [address] [place], [place] [address]      Radiology Studies: DG ERCP BILIARY & PANCREATIC DUCTS  Result Date: 08/22/2020 CLINICAL  DATA:  Obstructive jaundice EXAM: ERCP TECHNIQUE: Multiple spot images obtained with the fluoroscopic device and submitted for interpretation post-procedure. FLUOROSCOPY TIME:  Fluoroscopy Time:  3 minutes, 17 seconds Radiation Exposure Index (if provided by the fluoroscopic device): Not available Number of Acquired Spot Images: 6 COMPARISON:  CT abdomen pelvis from 08/20/2020 FINDINGS: The duodenoscope is positioned in the  right upper quadrant with retrograde cannulation of the common bile duct. The biliary tree appears mildly dilated. Final image demonstrates placement of a common bile duct stent. IMPRESSION: Mild dilation of the common bile duct. Placement of a common bile duct stent. These images were submitted for radiologic interpretation only. Please see the procedural report for the amount of contrast and the fluoroscopy time utilized. Electronically Signed   By: Ruthann Cancer MD   On: 08/22/2020 15:14   DG ERCP BILIARY & PANCREATIC DUCTS  Final Result    CT ABDOMEN PELVIS W CONTRAST  Final Result      Scheduled Meds: . citalopram  40 mg Oral Daily  . cloNIDine  0.1 mg Oral QHS  . insulin aspart  0-9 Units Subcutaneous Q6H  . sodium chloride flush  3 mL Intravenous Q12H   PRN Meds: hydrALAZINE, labetalol, ondansetron **OR** ondansetron (ZOFRAN) IV Continuous Infusions: . sodium chloride 75 mL/hr at 08/22/20 1458      LOS: 2 days  Time spent: Greater than 50% of the 35 minute visit was spent in counseling/coordination of care for the patient as laid out in the A&P.   Dwyane Dee, MD [facility] 08/22/2020, 4:58 PM  Contact via secure chat.  To contact the attending provider between 7A-7P or the covering provider during after hours 7P-7A, please log into the web site [email or web address] and access using universal [place] password for that web site. If you do not have the password, please call the hospital operator.

## 2020-08-22 NOTE — Op Note (Signed)
[facility] Patient Name: Jacqueline Tyler Procedure Date: 08/22/2020 MRN: [id number] Attending MD: Clarene Essex , MD Date of Birth: 12/01/1960 CSN: [id number] Age: 59 Admit Type: Inpatient Procedure:                ERCP Indications:              Biliary dilation on Computed Tomogram Scan,                            Jaundice, Tumor of the head of pancreas Providers:                Clarene Essex, MD, Cleda Daub, RN, Laverda Sorenson,                            Technician, Tyrone Apple, Technician, Maudry Diego, CRNA Referring MD:              Medicines:                General Anesthesia Complications:            No immediate complications. Estimated Blood Loss:     Estimated blood loss was minimal. Procedure:                Pre-Anesthesia Assessment:                           - Prior to the procedure, a History and Physical                            was performed, and patient medications and                            allergies were reviewed. The patient's tolerance of                            previous anesthesia was also reviewed. The risks                            and benefits of the procedure and the sedation                            options and risks were discussed with the patient.                            All questions were answered, and informed consent                            was obtained. Prior Anticoagulants: The patient has                            taken no previous anticoagulant or antiplatelet                            agents. ASA  Grade Assessment: II - A patient with                            mild systemic disease. After reviewing the risks                            and benefits, the patient was deemed in                            satisfactory condition to undergo the procedure.                           After obtaining informed consent, the scope was                            passed under direct vision.  Throughout the                            procedure, the patient's blood pressure, pulse, and                            oxygen saturations were monitored continuously. The                            [id number]-Q180V ([id number]) Olympus Duodenoscope was                            introduced through the mouth, and used to inject                            contrast into and used to locate the major papilla.                            The ERCP was accomplished without difficulty. The                            patient tolerated the procedure well. Scope In: Scope Out: Findings:      The major papilla was normal. Deep selective cannulation was readily       obtained and there with both cystic duct and CBD filling and a 2 cm       lower to mid duct stricture was readily seen and we proceeded with a       biliary sphincterotomy was made with a Hydratome sphincterotome using       ERBE electrocautery. We proceeded till he had adequate drainage and       could get the fully bowed sphincterotome easily in and out of the duct       and there was self limited oozing after the sphincterotomy which started       when we remove the sphincterotome and seemed to be coming from the duct       and not the sphincterotomy site which did not require treatment. The       sphincterotomy was washed and watched and there was no obvious further       bleeding and cells for  cytology were obtained by brushing in the lower       and middle      third of the main bile duct. One 10 Fr by 7 cm plastic stent with a       single external flap and a single internal flap was placed 6.5 cm into       the common bile duct. Bile and clear fluid flowed through the stent. The       stent was in good position. No pancreatic injection or obvious wire       advancement was done throughout the procedure Impression:               - The major papilla appeared normal.                           - A biliary sphincterotomy was performed.                            - Cells for cytology obtained in the lower third                            and middle of the main duct.                           - One plastic stent was placed into the common bile                            duct. Moderate Sedation:      Not Applicable - Patient had care per Anesthesia. Recommendation:           - Clear liquid diet for 6 hours.                           - Continue present medications. Follow liver tests                            back to normal                           - Await cytology results. If positive oncology and                            radiation therapy consults and if nondiagnostic EUS                            ASAP                           - Return to GI clinic PRN.                           - Telephone GI clinic for pathology results in 3                            days.                           -  Telephone GI clinic if symptomatic PRN. Will need                            stent change in the future if not a surgical                            candidate after chemo and radiation Procedure Code(s):        --- Professional ---                           [phone], Esophagogastroduodenoscopy, flexible,                            transoral; diagnostic, including collection of                            specimen(s) by brushing or washing, when performed                            (separate procedure) Diagnosis Code(s):        --- Professional ---                           R17, Unspecified jaundice                           D49.0, Neoplasm of unspecified behavior of                            digestive system                           K83.8, Other specified diseases of biliary tract CPT copyright 2019 American Medical Association. All rights reserved. The codes documented in this report are preliminary and upon coder review may  be revised to meet current compliance requirements. Clarene Essex, MD 08/22/2020 2:02:28 PM This report has been  signed electronically. Number of Addenda: 0

## 2020-08-22 NOTE — Anesthesia Procedure Notes (Signed)
Procedure Name: Intubation Date/Time: 08/22/2020 12:50 PM Performed by: Niel Hummer, CRNA Pre-anesthesia Checklist: Suction available, Emergency Drugs available, Patient identified and Patient being monitored Patient Re-evaluated:Patient Re-evaluated prior to induction Oxygen Delivery Method: Circle system utilized Preoxygenation: Pre-oxygenation with 100% oxygen Induction Type: IV induction Ventilation: Mask ventilation without difficulty and Oral airway inserted - appropriate to patient size Laryngoscope Size: Mac and 4 Grade View: Grade II Tube type: Oral Tube size: 7.0 mm Number of attempts: 1 Airway Equipment and Method: Stylet Placement Confirmation: ETT inserted through vocal cords under direct vision,  positive ETCO2 and breath sounds checked- equal and bilateral Secured at: 22 cm Tube secured with: Tape Dental Injury: Teeth and Oropharynx as per pre-operative assessment  Comments: DL x1 by EMT student Cherly [place]. Grade 2 view. DL again by CRNA, grade 2 view. Passed ETT easily.

## 2020-08-23 DIAGNOSIS — E119 Type 2 diabetes mellitus without complications: Secondary | ICD-10-CM

## 2020-08-23 LAB — GLUCOSE, CAPILLARY
Glucose-Capillary: 117 mg/dL — ABNORMAL HIGH (ref 70–99)
Glucose-Capillary: 175 mg/dL — ABNORMAL HIGH (ref 70–99)
Glucose-Capillary: 198 mg/dL — ABNORMAL HIGH (ref 70–99)
Glucose-Capillary: 215 mg/dL — ABNORMAL HIGH (ref 70–99)

## 2020-08-23 LAB — CBC WITH DIFFERENTIAL/PLATELET
Abs Immature Granulocytes: 0.24 10*3/uL — ABNORMAL HIGH (ref 0.00–0.07)
Basophils Absolute: 0.1 10*3/uL (ref 0.0–0.1)
Basophils Relative: 0 %
Eosinophils Absolute: 0 10*3/uL (ref 0.0–0.5)
Eosinophils Relative: 0 %
HCT: 36.1 % (ref 36.0–46.0)
Hemoglobin: 11.8 g/dL — ABNORMAL LOW (ref 12.0–15.0)
Immature Granulocytes: 2 %
Lymphocytes Relative: 13 %
Lymphs Abs: 1.6 10*3/uL (ref 0.7–4.0)
MCH: 27.1 pg (ref 26.0–34.0)
MCHC: 32.7 g/dL (ref 30.0–36.0)
MCV: 83 fL (ref 80.0–100.0)
Monocytes Absolute: 0.6 10*3/uL (ref 0.1–1.0)
Monocytes Relative: 5 %
Neutro Abs: 9.5 10*3/uL — ABNORMAL HIGH (ref 1.7–7.7)
Neutrophils Relative %: 80 %
Platelets: 386 10*3/uL (ref 150–400)
RBC: 4.35 MIL/uL (ref 3.87–5.11)
RDW: 17.1 % — ABNORMAL HIGH (ref 11.5–15.5)
WBC: 12.1 10*3/uL — ABNORMAL HIGH (ref 4.0–10.5)
nRBC: 0 % (ref 0.0–0.2)

## 2020-08-23 LAB — COMPREHENSIVE METABOLIC PANEL
ALT: 369 U/L — ABNORMAL HIGH (ref 0–44)
AST: 208 U/L — ABNORMAL HIGH (ref 15–41)
Albumin: 3.6 g/dL (ref 3.5–5.0)
Alkaline Phosphatase: 885 U/L — ABNORMAL HIGH (ref 38–126)
Anion gap: 12 (ref 5–15)
BUN: 14 mg/dL (ref 6–20)
CO2: 22 mmol/L (ref 22–32)
Calcium: 9.1 mg/dL (ref 8.9–10.3)
Chloride: 103 mmol/L (ref 98–111)
Creatinine, Ser: 0.81 mg/dL (ref 0.44–1.00)
GFR calc Af Amer: >60 mL/min (ref >60)
GFR calc non Af Amer: >60 mL/min (ref >60)
Glucose, Bld: 149 mg/dL — ABNORMAL HIGH (ref 70–99)
Potassium: 3.9 mmol/L (ref 3.5–5.1)
Sodium: 137 mmol/L (ref 135–145)
Total Bilirubin: 4.7 mg/dL — ABNORMAL HIGH (ref 0.3–1.2)
Total Protein: 7.3 g/dL (ref 6.5–8.1)

## 2020-08-23 LAB — T3: T3, Total: 65 ng/dL — ABNORMAL LOW (ref 71–180)

## 2020-08-23 LAB — MAGNESIUM: Magnesium: 2 mg/dL (ref 1.7–2.4)

## 2020-08-23 LAB — CYTOLOGY - NON PAP

## 2020-08-23 NOTE — Progress Notes (Signed)
PROGRESS NOTE    Jacqueline Tyler   MCN:[id number]  DOB: 1961-02-19  DOA: 08/20/2020     3  PCP: Nicola Girt, DO  CC: "yellow skin"  Hospital Course: Jacqueline Tyler is a 59 yo CF with PMH HTN, HLD who presented with jaundice, change in urine and stool color.  She had been in her usual state of health until approximately 3 days prior to admission when she stated that her skin started turning yellow, her urine yellow, and her stool white/gray.  She denied any significant abdominal pain, fevers, chills, night sweats, unintentional weight loss.  She has no known prior history of cancer and denied any family history of cancers.  She underwent work-up in the ER on admission: CBC overall unremarkable Na 132, K 2.8, AST 349, ALT 473, AP 903, TB 7.3 Hepatitis panel negative A1c 7.1% INR 0.9, PT 12.2 Lipase 25  CT abdomen/pelvis was obtained which revealed a 3.3 x 2.2 cm low-density mass in the pancreatic head concerning for malignancy.  She was admitted for further evaluation with GI for ERCP and further work-up. She underwent ERCP on 08/22/2020.  Brushings were taken during procedure and she also underwent biliary sphincterotomy. Cytology was negative on ERCP and GI has recommended EUS given our high suspicion for malignancy. Patient still has some denial underlying but is consenting to procedures and workup.   Interval History:  No events overnight. Understands plan is to remain in hospital until workup is complete and LFTs have improved more.  We talked about her A1c this morning a little bit.  She wishes to more so pursue lifestyle modifications at discharge but that her husband is also on Metformin so she is aware of some of the treatment for diabetes.  Old records reviewed in assessment of this patient  ROS: Constitutional: positive for fatigue, Respiratory: negative for cough, Cardiovascular: negative for chest pain and Gastrointestinal: negative for abdominal pain  Assessment & Plan: *  Pancreatic mass -Measures 3.3 x 2.2 cm.  This is concerning for an underlying probable malignancy.  When discussing with patient, she remains in denial.  Husband has been present for discussions. -GI consulted.  Patient underwent ERCP on 08/22/2020.  Biliary sphincterotomy performed and brushings were obtained (negative for malignancy but high suspicion clinically still) - EUS planned for 10/6 per GI -Discussed overall plan with patient, she is still not wishing to discuss the "C" word -Currently has no pain or significant nausea/vomiting.  Obstructive jaundice - likely due to cancer from pancreatic mass; need to await formal tissue diagnosis if possible - LFTs/obstructive pattern has started to improve s/p ERCP with sphincterotomy  Type 2 diabetes mellitus without complication ([place]) -Assuming type II at this time yet etiology possibly adult onset versus some association with underlying pancreatic malignancy -May need further work-up outpatient with endocrinology depending on clinical course -A1c 7.1% -Start on SSI and CBG monitoring for now  Prerenal azotemia-resolved as of 08/22/2020 - FeNa = 0.9% consistent with prerenal  - continue IVF  Hyponatremia-resolved as of 08/22/2020 -Etiology most likely prerenal however could also be contribution of SIADH from underlying malignancy -So far sodium is responding some to IV fluids, will continue fluids and monitor response  Hypokalemia -Replete and recheck as needed  Hypertension -Resume home clonidine -Also use as needed labetalol or hydralazine   Antimicrobials: none  DVT prophylaxis: SCD Code Status: Full Family Communication: husband Disposition Plan: Status is: Inpatient  Remains inpatient appropriate because:Ongoing diagnostic testing needed not appropriate for outpatient work  up, IV treatments appropriate due to intensity of illness or inability to take PO and Inpatient level of care appropriate due to severity of  illness   Dispo: The patient is from: Home              Anticipated d/c is to: Home              Anticipated d/c date is: 2 days              Patient currently is not medically stable to d/c.  Objective: Blood pressure 124/65, pulse 65, temperature 98.1 F (36.7 C), temperature source Oral, resp. rate 16, height 5\' 2"  (1.575 m), weight 68.9 kg, SpO2 98 %.  Examination: General appearance: alert, cooperative, icteric and no distress Head: Normocephalic, without obvious abnormality, atraumatic Eyes: positive findings: sclera icterus Lungs: clear to auscultation bilaterally Heart: regular rate and rhythm and S1, S2 normal Abdomen: normal findings: bowel sounds normal, no masses palpable and soft, non-tender Extremities: no edema Skin: jaundice noted Neurologic: Grossly normal  Consultants:   GI  Procedures:   ERCP, 08/22/20  Data Reviewed: I have personally reviewed following labs and imaging studies Results for orders placed or performed during the hospital encounter of 08/20/20 (from the past 24 hour(s))  Glucose, capillary     Status: Abnormal   Collection Time: 08/22/20  5:05 PM  Result Value Ref Range   Glucose-Capillary 157 (H) 70 - 99 mg/dL  Glucose, capillary     Status: Abnormal   Collection Time: 08/22/20 11:10 PM  Result Value Ref Range   Glucose-Capillary 216 (H) 70 - 99 mg/dL   Comment 1 Notify RN    Comment 2 Document in Chart   Glucose, capillary     Status: Abnormal   Collection Time: 08/23/20  5:56 AM  Result Value Ref Range   Glucose-Capillary 117 (H) 70 - 99 mg/dL   Comment 1 Notify RN    Comment 2 Document in Chart   CBC with Differential/Platelet     Status: Abnormal   Collection Time: 08/23/20  6:06 AM  Result Value Ref Range   WBC 12.1 (H) 4.0 - 10.5 K/uL   RBC 4.35 3.87 - 5.11 MIL/uL   Hemoglobin 11.8 (L) 12.0 - 15.0 g/dL   HCT 36.1 36 - 46 %   MCV 83.0 80.0 - 100.0 fL   MCH 27.1 26.0 - 34.0 pg   MCHC 32.7 30.0 - 36.0 g/dL   RDW 17.1 (H)  11.5 - 15.5 %   Platelets 386 150 - 400 K/uL   nRBC 0.0 0.0 - 0.2 %   Neutrophils Relative % 80 %   Neutro Abs 9.5 (H) 1.7 - 7.7 K/uL   Lymphocytes Relative 13 %   Lymphs Abs 1.6 0.7 - 4.0 K/uL   Monocytes Relative 5 %   Monocytes Absolute 0.6 0 - 1 K/uL   Eosinophils Relative 0 %   Eosinophils Absolute 0.0 0 - 0 K/uL   Basophils Relative 0 %   Basophils Absolute 0.1 0 - 0 K/uL   Immature Granulocytes 2 %   Abs Immature Granulocytes 0.24 (H) 0.00 - 0.07 K/uL  Comprehensive metabolic panel     Status: Abnormal   Collection Time: 08/23/20  6:06 AM  Result Value Ref Range   Sodium 137 135 - 145 mmol/L   Potassium 3.9 3.5 - 5.1 mmol/L   Chloride 103 98 - 111 mmol/L   CO2 22 22 - 32 mmol/L   Glucose, Bld 149 (  H) 70 - 99 mg/dL   BUN 14 6 - 20 mg/dL   Creatinine, Ser 0.81 0.44 - 1.00 mg/dL   Calcium 9.1 8.9 - 10.3 mg/dL   Total Protein 7.3 6.5 - 8.1 g/dL   Albumin 3.6 3.5 - 5.0 g/dL   AST 208 (H) 15 - 41 U/L   ALT 369 (H) 0 - 44 U/L   Alkaline Phosphatase 885 (H) 38 - 126 U/L   Total Bilirubin 4.7 (H) 0.3 - 1.2 mg/dL   GFR calc non Af Amer >60 >60 mL/min   GFR calc Af Amer >60 >60 mL/min   Anion gap 12 5 - 15  Magnesium     Status: None   Collection Time: 08/23/20  6:06 AM  Result Value Ref Range   Magnesium 2.0 1.7 - 2.4 mg/dL  Glucose, capillary     Status: Abnormal   Collection Time: 08/23/20 12:55 PM  Result Value Ref Range   Glucose-Capillary 175 (H) 70 - 99 mg/dL    Recent Results (from the past 240 hour(s))  Respiratory Panel by RT PCR (Flu A&B, Covid) - Nasopharyngeal Swab     Status: None   Collection Time: 08/20/20  3:40 PM   Specimen: Nasopharyngeal Swab  Result Value Ref Range Status   SARS Coronavirus 2 by RT PCR NEGATIVE NEGATIVE Final    Comment: (NOTE) SARS-CoV-2 target nucleic acids are NOT DETECTED.  The SARS-CoV-2 RNA is generally detectable in upper respiratoy specimens during the acute phase of infection. The lowest concentration of SARS-CoV-2  viral copies this assay can detect is 131 copies/mL. A negative result does not preclude SARS-Cov-2 infection and should not be used as the sole basis for treatment or other patient management decisions. A negative result may occur with  improper specimen collection/handling, submission of specimen other than nasopharyngeal swab, presence of viral mutation(s) within the areas targeted by this assay, and inadequate number of viral copies (<131 copies/mL). A negative result must be combined with clinical observations, patient history, and epidemiological information. The expected result is Negative.  Fact Sheet for Patients:  PinkCheek.be  Fact Sheet for Healthcare Providers:  GravelBags.it  This test is no t yet approved or cleared by the [place] FDA and  has been authorized for detection and/or diagnosis of SARS-CoV-2 by FDA under an Emergency Use Authorization (EUA). This EUA will remain  in effect (meaning this test can be used) for the duration of the COVID-19 declaration under Section 564(b)(1) of the Act, 21 U.S.C. section 360bbb-3(b)(1), unless the authorization is terminated or revoked sooner.     Influenza A by PCR NEGATIVE NEGATIVE Final   Influenza B by PCR NEGATIVE NEGATIVE Final    Comment: (NOTE) The Xpert Xpress SARS-CoV-2/FLU/RSV assay is intended as an aid in  the diagnosis of influenza from Nasopharyngeal swab specimens and  should not be used as a sole basis for treatment. Nasal washings and  aspirates are unacceptable for Xpert Xpress SARS-CoV-2/FLU/RSV  testing.  Fact Sheet for Patients: PinkCheek.be  Fact Sheet for Healthcare Providers: GravelBags.it  This test is not yet approved or cleared by the [place] FDA and  has been authorized for detection and/or diagnosis of SARS-CoV-2 by  FDA under an Emergency Use Authorization (EUA).  This EUA will remain  in effect (meaning this test can be used) for the duration of the  Covid-19 declaration under Section 564(b)(1) of the Act, 21  U.S.C. section 360bbb-3(b)(1), unless the authorization is  terminated or revoked. Performed at [facility], [address] [place], [place] [address]      Radiology Studies: DG ERCP BILIARY & PANCREATIC DUCTS  Result Date: 08/22/2020 CLINICAL DATA:  Obstructive jaundice EXAM: ERCP TECHNIQUE: Multiple spot images obtained with the fluoroscopic device and submitted for interpretation post-procedure. FLUOROSCOPY TIME:  Fluoroscopy Time:  3 minutes, 17 seconds Radiation Exposure Index (if provided by the fluoroscopic device): Not available Number of Acquired Spot Images: 6 COMPARISON:  CT abdomen pelvis from 08/20/2020 FINDINGS: The duodenoscope is positioned in the right upper quadrant with retrograde cannulation of the common bile duct. The biliary tree appears mildly dilated. Final image demonstrates placement of a common bile duct stent. IMPRESSION: Mild dilation of the common bile duct. Placement of a common bile duct stent. These images were submitted for radiologic interpretation only. Please see the procedural report for the amount of contrast and the fluoroscopy time utilized. Electronically Signed   By: Ruthann Cancer MD   On: 08/22/2020 15:14   DG ERCP BILIARY & PANCREATIC DUCTS  Final Result    CT ABDOMEN PELVIS W CONTRAST  Final Result      Scheduled Meds: . citalopram  40 mg Oral Daily  . cloNIDine  0.1 mg Oral QHS  . insulin aspart  0-9 Units Subcutaneous Q6H  . sodium chloride flush  3 mL Intravenous Q12H   PRN Meds: hydrALAZINE, labetalol, ondansetron **OR** ondansetron (ZOFRAN) IV Continuous Infusions: . sodium chloride 75 mL/hr at 08/22/20 2149      LOS: 3 days  Time spent: Greater than 50% of the 35 minute visit was spent in counseling/coordination of care for the patient as laid out in the A&P.   Dwyane Dee, MD [facility] 08/23/2020, 2:46 PM  Contact via secure chat.  To contact the attending provider between 7A-7P or the covering provider during after hours 7P-7A, please log into the web site [email or web address] and access using universal [place] password for that web site. If you do not have the password, please call the hospital operator.

## 2020-08-23 NOTE — Plan of Care (Signed)

## 2020-08-23 NOTE — Progress Notes (Signed)
[facility] Gastroenterology Progress Note  Nychelle Cassata Severino 59 y.o. 07/15/1961  CC:  Obstructive jaundice with pancreatic mass  Subjective: Patient tolerated ERCP well yesterday.  Denies any abdominal pain, nausea, vomiting today.  Is tolerating a regular diet.  ROS : Review of Systems  Constitutional: Negative for chills and fever.  Respiratory: Negative for cough and shortness of breath.   Cardiovascular: Negative for chest pain.  Gastrointestinal: Negative for abdominal pain, blood in stool, constipation, diarrhea, heartburn, melena, nausea and vomiting.   Objective: Vital signs in last 24 hours: Vitals:   08/22/20 2308 08/23/20 0257  BP: 123/70 115/63  Pulse: 60 (!) 56  Resp: 18 18  Temp: 97.7 F (36.5 C) 97.8 F (36.6 C)  SpO2: 96% 92%    Physical Exam:  General:  Alert, oriented, cooperative, no acute distress  Head:  Normocephalic, without obvious abnormality, atraumatic  Eyes:   Icteric sclera, EOM's intact  Lungs:   Clear to auscultation bilaterally, respirations unlabored  Heart:  Regular rate and rhythm, S1, S2 normal  Abdomen:   Soft, non-tender, non-distended, bowel sounds active all four quadrants,  no guarding or peritoneal signs  Extremities: Extremities normal, atraumatic, no  edema  Pulses: 2+ and symmetric    Lab Results: Recent Labs    08/21/20 0050 08/21/20 0050 08/22/20 0620 08/23/20 0606  NA 134*  135   < > 137 137  K 3.0*  3.1*   < > 3.8 3.9  CL 96*  96*   < > 106 103  CO2 25  25   < > 22 22  GLUCOSE 260*  260*   < > 138* 149*  BUN 13  13   < > 16 14  CREATININE 0.69  0.67   < > 0.69 0.81  CALCIUM 9.1  9.1   < > 8.7* 9.1  MG 2.0   < > 2.1 2.0  PHOS 4.0  --   --   --    < > = values in this interval not displayed.   Recent Labs    08/22/20 0620 08/23/20 0606  AST 426* 208*  ALT 439* 369*  ALKPHOS 837* 885*  BILITOT 13.0* 4.7*  PROT 6.5 7.3  ALBUMIN 3.1* 3.6   Recent Labs    08/22/20 0620 08/23/20 0606  WBC 8.9 12.1*   NEUTROABS 5.3 9.5*  HGB 11.4* 11.8*  HCT 35.2* 36.1  MCV 83.8 83.0  PLT 339 386   Recent Labs    08/20/20 1426  LABPROT 12.2  INR 0.9    Assessment: Obstructive jaundice with pancreatic mass s/p ERCP 10/4 with sphincterotomy and stent placement -Cytology: No malignant cells identified. Benign reactive/reparative changes  -LFTs trending down.  Today, T bili 4.7/AST 208/ALT 369/ALP 885 as compared to yesterday T bili 13.0/AST 426/ALT 439/ALP 885 -WBC is mildly elevated to 12.1 today as compared to 8.9 yesterday  Assessment: EUS tomorrow, as cytology was negative, and malignancy is suspected.  I thoroughly discussed the procedure with the patient to include nature, alternatives, benefits, and risks (including but not limited to bleeding, infection, perforation, and anesthesia/cardiac and pulmonary complications).  Patient verbalized understanding and gave verbal consent to proceed with endoscopic ultrasound tomorrow.  Salley Slaughter PA-C 08/23/2020, 9:32 AM  Contact #  [phone]

## 2020-08-24 ENCOUNTER — Encounter (HOSPITAL_COMMUNITY): Payer: Self-pay | Admitting: Internal Medicine

## 2020-08-24 ENCOUNTER — Inpatient Hospital Stay (HOSPITAL_COMMUNITY): Payer: 59 | Admitting: Certified Registered"

## 2020-08-24 ENCOUNTER — Encounter (HOSPITAL_COMMUNITY)
Admission: EM | Disposition: A | Discharge status: Home / Self Care | Payer: Self-pay | Source: Home / Self Care | Attending: Internal Medicine

## 2020-08-24 HISTORY — PX: UPPER ESOPHAGEAL ENDOSCOPIC ULTRASOUND (EUS): SHX6562

## 2020-08-24 HISTORY — PX: ESOPHAGOGASTRODUODENOSCOPY (EGD) WITH PROPOFOL: SHX5813

## 2020-08-24 HISTORY — PX: FINE NEEDLE ASPIRATION: SHX5430

## 2020-08-24 LAB — CBC WITH DIFFERENTIAL/PLATELET
Abs Immature Granulocytes: 0.13 10*3/uL — ABNORMAL HIGH (ref 0.00–0.07)
Basophils Absolute: 0.1 10*3/uL (ref 0.0–0.1)
Basophils Relative: 1 %
Eosinophils Absolute: 0.1 10*3/uL (ref 0.0–0.5)
Eosinophils Relative: 1 %
HCT: 32.2 % — ABNORMAL LOW (ref 36.0–46.0)
Hemoglobin: 10.1 g/dL — ABNORMAL LOW (ref 12.0–15.0)
Immature Granulocytes: 1 %
Lymphocytes Relative: 27 %
Lymphs Abs: 2.8 10*3/uL (ref 0.7–4.0)
MCH: 27.2 pg (ref 26.0–34.0)
MCHC: 31.4 g/dL (ref 30.0–36.0)
MCV: 86.8 fL (ref 80.0–100.0)
Monocytes Absolute: 1 10*3/uL (ref 0.1–1.0)
Monocytes Relative: 9 %
Neutro Abs: 6.5 10*3/uL (ref 1.7–7.7)
Neutrophils Relative %: 61 %
Platelets: 326 10*3/uL (ref 150–400)
RBC: 3.71 MIL/uL — ABNORMAL LOW (ref 3.87–5.11)
RDW: 17.2 % — ABNORMAL HIGH (ref 11.5–15.5)
WBC: 10.7 10*3/uL — ABNORMAL HIGH (ref 4.0–10.5)
nRBC: 0 % (ref 0.0–0.2)

## 2020-08-24 LAB — COMPREHENSIVE METABOLIC PANEL
ALT: 206 U/L — ABNORMAL HIGH (ref 0–44)
AST: 64 U/L — ABNORMAL HIGH (ref 15–41)
Albumin: 3 g/dL — ABNORMAL LOW (ref 3.5–5.0)
Alkaline Phosphatase: 610 U/L — ABNORMAL HIGH (ref 38–126)
Anion gap: 8 (ref 5–15)
BUN: 17 mg/dL (ref 6–20)
CO2: 22 mmol/L (ref 22–32)
Calcium: 8 mg/dL — ABNORMAL LOW (ref 8.9–10.3)
Chloride: 109 mmol/L (ref 98–111)
Creatinine, Ser: 0.83 mg/dL (ref 0.44–1.00)
GFR calc non Af Amer: >60 mL/min (ref >60)
Glucose, Bld: 127 mg/dL — ABNORMAL HIGH (ref 70–99)
Potassium: 3.7 mmol/L (ref 3.5–5.1)
Sodium: 139 mmol/L (ref 135–145)
Total Bilirubin: 2.6 mg/dL — ABNORMAL HIGH (ref 0.3–1.2)
Total Protein: 5.9 g/dL — ABNORMAL LOW (ref 6.5–8.1)

## 2020-08-24 LAB — GLUCOSE, CAPILLARY
Glucose-Capillary: 121 mg/dL — ABNORMAL HIGH (ref 70–99)
Glucose-Capillary: 95 mg/dL (ref 70–99)

## 2020-08-24 LAB — MAGNESIUM: Magnesium: 2 mg/dL (ref 1.7–2.4)

## 2020-08-24 SURGERY — ESOPHAGOGASTRODUODENOSCOPY (EGD) WITH PROPOFOL
Anesthesia: Monitor Anesthesia Care

## 2020-08-24 MED ORDER — PROPOFOL 10 MG/ML IV BOLUS
INTRAVENOUS | Status: DC | PRN
  Administered 2020-08-24: 20 mg via INTRAVENOUS

## 2020-08-24 MED ORDER — PROPOFOL 500 MG/50ML IV EMUL
INTRAVENOUS | Status: DC | PRN
  Administered 2020-08-24: 125 ug/kg/min via INTRAVENOUS

## 2020-08-24 MED ORDER — PROPOFOL 1000 MG/100ML IV EMUL
INTRAVENOUS | Status: AC
  Filled 2020-08-24: qty 100

## 2020-08-24 MED ORDER — PROPOFOL 10 MG/ML IV BOLUS
INTRAVENOUS | Status: AC
  Filled 2020-08-24: qty 20

## 2020-08-24 MED ORDER — SODIUM CHLORIDE 0.9 % IV SOLN: INTRAVENOUS | Status: DC

## 2020-08-24 MED ORDER — LACTATED RINGERS IV SOLN: INTRAVENOUS | Status: DC

## 2020-08-24 SURGICAL SUPPLY — 15 items

## 2020-08-24 NOTE — Anesthesia Postprocedure Evaluation (Signed)
Anesthesia Post Note  Patient: Jacqueline Tyler  Procedure(s) Performed: ESOPHAGOGASTRODUODENOSCOPY (EGD) WITH PROPOFOL (N/A ) UPPER ESOPHAGEAL ENDOSCOPIC ULTRASOUND (EUS) (N/A ) FINE NEEDLE ASPIRATION (FNA) LINEAR (N/A )     Patient location during evaluation: PACU Anesthesia Type: MAC Level of consciousness: awake and alert Pain management: pain level controlled Vital Signs Assessment: post-procedure vital signs reviewed and stable Respiratory status: spontaneous breathing, nonlabored ventilation, respiratory function stable and patient connected to nasal cannula oxygen Cardiovascular status: stable and blood pressure returned to baseline Postop Assessment: no apparent nausea or vomiting Anesthetic complications: no   No complications documented.  Last Vitals:  Vitals:   08/24/20 1104 08/24/20 1127  BP: (!) 132/52 (!) 156/70  Pulse: 64 60  Resp: 19 18  Temp:  36.6 C  SpO2: 98% 99%    Last Pain:  Vitals:   08/24/20 1127  TempSrc: Oral  PainSc:                  [name]

## 2020-08-24 NOTE — Anesthesia Procedure Notes (Signed)
Procedure Name: MAC Date/Time: 08/24/2020 9:39 AM Performed by: Eben Burow, CRNA Pre-anesthesia Checklist: Patient identified, Emergency Drugs available, Suction available, Patient being monitored and Timeout performed Oxygen Delivery Method: Simple face mask Placement Confirmation: positive ETCO2

## 2020-08-24 NOTE — Op Note (Signed)
[facility] Patient Name: Jacqueline Tyler Procedure Date: 08/24/2020 MRN: [id number] Attending MD: Arta Silence , MD Date of Birth: Feb 09, 1961 CSN: [id number] Age: 59 Admit Type: Outpatient Procedure:                Upper EUS Indications:              Suspected mass in pancreas on CT scan Providers:                Arta Silence, MD, Cleda Daub, RN, Erenest Rasher, RN, Laverda Sorenson, Technician, Jefm Miles CRNA Referring MD:             [facility] Medicines:                Monitored Anesthesia Care Complications:            No immediate complications. Estimated Blood Loss:     Estimated blood loss: none. Procedure:                Pre-Anesthesia Assessment:                           - Prior to the procedure, a History and Physical                            was performed, and patient medications and                            allergies were reviewed. The patient's tolerance of                            previous anesthesia was also reviewed. The risks                            and benefits of the procedure and the sedation                            options and risks were discussed with the patient.                            All questions were answered, and informed consent                            was obtained. Prior Anticoagulants: The patient has                            taken no previous anticoagulant or antiplatelet                            agents. ASA Grade Assessment: II - A patient with  mild systemic disease. After reviewing the risks                            and benefits, the patient was deemed in                            satisfactory condition to undergo the procedure.                           After obtaining informed consent, the endoscope was                            passed under direct vision. Throughout the                            procedure, the  patient's blood pressure, pulse, and                            oxygen saturations were monitored continuously. The                            [id number] ([id number]) Olympus Linear EUS was                            introduced through the mouth, and advanced to the                            second part of duodenum. The upper EUS was                            accomplished without difficulty. The patient                            tolerated the procedure well. Scope In: Scope Out: Findings:      ENDOSONOGRAPHIC FINDING: :      There was no sign of significant endosonographic abnormality in the       ampulla.      A few malignant-appearing lymph nodes were visualized in the       peripancreatic region and porta hepatis region. The nodes were irregular       and hypoechoic.      One stent was visualized endosonographically in the common bile duct.       Extension of the stent was noted in the common hepatic duct.      A round mass was identified in the pancreatic head. The mass was       hypoechoic. The mass measured 27 mm by 22 mm in maximal cross-sectional       diameter. The endosonographic borders were well-defined. There was       sonographic evidence suggesting invasion into the portal vein       (manifested by invasion) and the superior mesenteric vein (manifested by       encasement). An intact interface was seen between the mass and the       superior mesenteric artery suggesting a lack of invasion. The remainder       of the pancreas  was examined. The endosonographic appearance of       parenchyma and the upstream pancreatic duct indicated duct dilation.       Fine needle aspiration for cytology was performed. Color Doppler imaging       was utilized prior to needle puncture to confirm a lack of significant       vascular structures within the needle path. Four passes were made with       the 25 gauge needle using a transduodenal approach. A stylet was used. A        cytotechnologist was present to evaluate the adequacy of the specimen.       The cellularity of the specimen was adequate. Final cytology results are       pending. Impression:               - There was no sign of significant pathology in the                            ampulla.                           - A few malignant-appearing lymph nodes were                            visualized in the peripancreatic region and porta                            hepatis region.                           - One stent was visualized endosonographically in                            the common bile duct.                           - A mass was identified in the pancreatic head.                            Tissue was obtained from this exam. 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                            Invasion into SMV/PV seen. Lymphadenopathy noted.                            This was staged T3 N1 Mx by endosonographic                            criteria. Fine needle aspiration performed. Moderate Sedation:      None Recommendation:           - Return patient to hospital ward for ongoing care.                           -  Resume previous diet today.                           - Continue present medications.                           - Await pathology results.                           - I have discussed case with Dr. Wyline [facility]), and                            have recommended outpatient surgical and oncology                            follow-up.                           - If no obvious complications from today's                            procedure, patient should be OK for discharge home                            today from GI perspective.                           - Return to GI clinic PRN. Procedure Code(s):        --- Professional ---                           [phone], Esophagogastroduodenoscopy, flexible,                            transoral; with  transendoscopic ultrasound-guided                            intramural or transmural fine needle                            aspiration/biopsy(s), (includes endoscopic                            ultrasound examination limited to the esophagus,                            stomach or duodenum, and adjacent structures) Diagnosis Code(s):        --- Professional ---                           I89.9, Noninfective disorder of lymphatic vessels                            and lymph nodes, unspecified                           K86.89, Other specified diseases of pancreas  R93.3, Abnormal findings on diagnostic imaging of                            other parts of digestive tract CPT copyright 2019 [employer]. All rights reserved. The codes documented in this report are preliminary and upon coder review may  be revised to meet current compliance requirements. Arta Silence, MD 08/24/2020 10:47:00 AM This report has been signed electronically. Number of Addenda: 0

## 2020-08-24 NOTE — Progress Notes (Signed)
Discharge instructions given to pt and all questions were answered.  

## 2020-08-24 NOTE — Interval H&P Note (Signed)
History and Physical Interval Note:  08/24/2020 9:24 AM  Jacqueline Tyler  has presented today for surgery, with the diagnosis of pancreatic mass.  The various methods of treatment have been discussed with the patient and family. After consideration of risks, benefits and other options for treatment, the patient has consented to  Procedure(s): ESOPHAGOGASTRODUODENOSCOPY (EGD) WITH PROPOFOL (N/A) UPPER ESOPHAGEAL ENDOSCOPIC ULTRASOUND (EUS) (N/A) as a surgical intervention.  The patient's history has been reviewed, patient examined, no change in status, stable for surgery.  I have reviewed the patient's chart and labs.  Questions were answered to the patient's satisfaction.     Athony Coppa M  Assessment:   Pancreatic mass with double duct sign. 2.  Obstructive jaundice, improved with ERCP + Stent on Monday.  Plan:   Upper endoscopic ultrasound with fine needle aspiration. Risks (bleeding, infection, bowel perforation that could require surgery, sedation-related changes in cardiopulmonary systems), benefits (identification and possible treatment of source of symptoms, exclusion of certain causes of symptoms), and alternatives (watchful waiting, radiographic imaging studies, empiric medical treatment) of upper endoscopy with ultrasound and possible fine needle aspiration (EUS +/- FNA) were explained to patient/family in detail and patient wishes to proceed.

## 2020-08-24 NOTE — Discharge Summary (Signed)
Physician Discharge Summary  Jacqueline Tyler XBJ:[id number] DOB: 05-09-1961 DOA: 08/20/2020  PCP: Nicola Girt, DO  Admit date: 08/20/2020 Discharge date: 08/24/2020  Admitted From: Home Disposition:  Home  Recommendations for Outpatient Follow-up:  1. Follow up with PCP in 1-2 weeks 2. Follow up with GI as scheduled 3. Please follow up on biopsy result from 10/6. Pending results of biopsy, likely would require referral to general surgery and/or Oncology  Discharge Condition:Stable CODE STATUS:Full Diet recommendation: Regular   Brief/Interim Summary: 59 yo CF with PMH HTN, HLD who presented with jaundice, change in urine and stool color.  She had been in her usual state of health until approximately 3 days prior to admission when she stated that her skin started turning yellow, her urine yellow, and her stool white/gray.  She denied any significant abdominal pain, fevers, chills, night sweats, unintentional weight loss.  She has no known prior history of cancer and denied any family history of cancers.  Discharge Diagnoses:  Principal Problem:   Pancreatic mass Active Problems:   Hypertension   Hypokalemia   Obstructive jaundice   Type 2 diabetes mellitus without complication (HCC)  * Pancreatic mass -Measures 3.3 x 2.2 cm.  This is concerning for an underlying probable malignancy.  -GI consulted. Patient underwent ERCP on 08/22/2020.  Biliary sphincterotomy performed and brushings were obtained (negative for malignancy but high suspicion clinically still) - EUS done on 10/6 per GI, biopsy results are pending -Dr. Sabino Gasser discussed overall plan with patient, she is still not wishing to discuss the "C" word -Currently has no pain or significant nausea/vomiting. - Have recommended pt f/u with GI for biopsy result. Please follow up on biopsy result obtained 10/6 -discussed with GI, OK to d/c today with close outpatient follow up  Obstructive jaundice - likely due to cancer from  pancreatic mass; need to await formal tissue diagnosis if possible - LFTs/obstructive pattern has started to improve s/p ERCP with sphincterotomy  Type 2 diabetes mellitus without complication ([place]) -Assuming type II at this time yet etiology possibly adult onset versus some association with underlying pancreatic malignancy -May need further work-up outpatient with endocrinology depending on clinical course -A1c 7.1% -Was continued on SSI while in hospital  Prerenal azotemia-resolved as of 08/22/2020 - FeNa = 0.9% consistent with prerenal  - Given IVF  Hyponatremia-resolved as of 08/22/2020 -Etiology most likely prerenal however could also be contribution of SIADH from underlying malignancy -So far sodium is responding some to IV fluids, will continue fluids and monitor response  Hypokalemia -Replaced  Hypertension -Resumed home clonidine  Discharge Instructions   Allergies as of 08/24/2020      Reactions   Oxycodone-acetaminophen Nausea Only   Patient was unsure / doesn't recall taking    Poison Ivy Extract Hives   Patient states HIGHLY allergice   Tyloxapol    Nausea       Medication List    TAKE these medications   aspirin 81 MG EC tablet Take 1 tablet by mouth daily.   citalopram 40 MG tablet Commonly known as: CELEXA Take 10 mg by mouth daily.   cloNIDine 0.1 MG tablet Commonly known as: CATAPRES Take 0.1 mg by mouth at bedtime.   hydrochlorothiazide 12.5 MG tablet Commonly known as: HYDRODIURIL Take 12.5 mg by mouth daily.   PEPCID PO Take 1 tablet by mouth daily as needed (acid reflux).   simvastatin 80 MG tablet Commonly known as: ZOCOR Take 80 mg by mouth daily.  Follow-up Information    Nicola Girt, DO. Schedule an appointment as soon as possible for a visit in 1 week(s).   Specialty: Internal Medicine Contact information: [address] [place] [address] [phone]        Arta Silence, MD.  Schedule an appointment as soon as possible for a visit.   Specialty: Gastroenterology Contact information: [address] [address] [phone]              Allergies  Allergen Reactions  . Oxycodone-Acetaminophen Nausea Only    Patient was unsure / doesn't recall taking   . Poison Ivy Extract Hives    Patient states HIGHLY allergice  . Tyloxapol     Nausea     Consultations:  GI  Procedures/Studies: CT ABDOMEN PELVIS W CONTRAST  Result Date: 08/20/2020 CLINICAL DATA:  Jaundice. EXAM: CT ABDOMEN AND PELVIS WITH CONTRAST TECHNIQUE: Multidetector CT imaging of the abdomen and pelvis was performed using the standard protocol following bolus administration of intravenous contrast. CONTRAST:  175mL OMNIPAQUE IOHEXOL 300 MG/ML  SOLN COMPARISON:  January 16, 2006. FINDINGS: Lower chest: No acute abnormality. Hepatobiliary: No gallstones are noted. There is moderate intrahepatic and extrahepatic biliary dilatation which appears to be due to 3.3 x 2.2 cm low density mass in the pancreatic head consistent with malignancy. 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 This results in significant pancreatic ductal dilatation. 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 Collateral circulation is noted. Spleen: Normal in size without focal abnormality. Adrenals/Urinary Tract: Adrenal glands are unremarkable. Kidneys are normal, without renal calculi, focal lesion, or hydronephrosis. Bladder is unremarkable. Stomach/Bowel: The stomach appears normal. There is no evidence of bowel obstruction or inflammation. Vascular/Lymphatic: Aortic atherosclerosis. No enlarged abdominal or pelvic lymph nodes. Reproductive: Probable 2.7 cm uterine fibroid. No adnexal abnormality is noted. Other: No abdominal wall hernia or  abnormality. No abdominopelvic ascites. Musculoskeletal: No acute or significant osseous findings. IMPRESSION: 1. 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 Collateral circulation is noted. There is moderate intrahepatic and extrahepatic biliary dilatation which appears to be due to the pancreatic head mass. Pancreatic ductal dilatation is noted as well. 2. Probable 2.7 cm uterine fibroid. 3. Aortic atherosclerosis. Aortic Atherosclerosis (ICD10-I70.0). Electronically Signed   By: Marijo Conception M.D.   On: 08/20/2020 15:04   DG ERCP BILIARY & PANCREATIC DUCTS  Result Date: 08/22/2020 CLINICAL DATA:  Obstructive jaundice EXAM: ERCP TECHNIQUE: Multiple spot images obtained with the fluoroscopic device and submitted for interpretation post-procedure. FLUOROSCOPY TIME:  Fluoroscopy Time:  3 minutes, 17 seconds Radiation Exposure Index (if provided by the fluoroscopic device): Not available Number of Acquired Spot Images: 6 COMPARISON:  CT abdomen pelvis from 08/20/2020 FINDINGS: The duodenoscope is positioned in the right upper quadrant with retrograde cannulation of the common bile duct. The biliary tree appears mildly dilated. Final image demonstrates placement of a common bile duct stent. IMPRESSION: Mild dilation of the common bile duct. Placement of a common bile duct stent. These images were submitted for radiologic interpretation only. Please see the procedural report for the amount of contrast and  the fluoroscopy time utilized. Electronically Signed   By: Ruthann Cancer MD   On: 08/22/2020 15:14     Subjective: Eager to go home today  Discharge Exam: Vitals:   08/24/20 1104 08/24/20 1127  BP: (!) 132/52 (!) 156/70  Pulse: 64 60  Resp: 19 18  Temp:  97.9 F (36.6 C)  SpO2: 98% 99%   Vitals:   08/24/20 1050  08/24/20 1100 08/24/20 1104 08/24/20 1127  BP: 126/62 (!) 132/52 (!) 132/52 (!) 156/70  Pulse: 63 60 64 60  Resp: 20 (!) 25 19 18   Temp:    97.9 F (36.6 C)  TempSrc:    Oral  SpO2: 97% 98% 98% 99%  Weight:      Height:        General: Pt is alert, awake, not in acute distress Cardiovascular: RRR, S1/S2 +, no rubs, no gallops Respiratory: CTA bilaterally, no wheezing, no rhonchi Abdominal: Soft, NT, ND, bowel sounds + Extremities: no edema, no cyanosis   The results of significant diagnostics from this hospitalization (including imaging, microbiology, ancillary and laboratory) are listed below for reference.     Microbiology: Recent Results (from the past 240 hour(s))  Respiratory Panel by RT PCR (Flu A&B, Covid) - Nasopharyngeal Swab     Status: None   Collection Time: 08/20/20  3:40 PM   Specimen: Nasopharyngeal Swab  Result Value Ref Range Status   SARS Coronavirus 2 by RT PCR NEGATIVE NEGATIVE Final    Comment: (NOTE) SARS-CoV-2 target nucleic acids are NOT DETECTED.  The SARS-CoV-2 RNA is generally detectable in upper respiratoy specimens during the acute phase of infection. The lowest concentration of SARS-CoV-2 viral copies this assay can detect is 131 copies/mL. A negative result does not preclude SARS-Cov-2 infection and should not be used as the sole basis for treatment or other patient management decisions. A negative result may occur with  improper specimen collection/handling, submission of specimen other than nasopharyngeal swab, presence of viral mutation(s) within the areas targeted by this assay, and inadequate number of viral copies (<131 copies/mL). A negative result must be combined with clinical observations, patient history, and epidemiological information. The expected result is Negative.  Fact Sheet for Patients:  PinkCheek.be  Fact Sheet for Healthcare Providers:  GravelBags.it  This  test is no t yet approved or cleared by the [place] FDA and  has been authorized for detection and/or diagnosis of SARS-CoV-2 by FDA under an Emergency Use Authorization (EUA). This EUA will remain  in effect (meaning this test can be used) for the duration of the COVID-19 declaration under Section 564(b)(1) of the Act, 21 U.S.C. section 360bbb-3(b)(1), unless the authorization is terminated or revoked sooner.     Influenza A by PCR NEGATIVE NEGATIVE Final   Influenza B by PCR NEGATIVE NEGATIVE Final    Comment: (NOTE) The Xpert Xpress SARS-CoV-2/FLU/RSV assay is intended as an aid in  the diagnosis of influenza from Nasopharyngeal swab specimens and  should not be used as a sole basis for treatment. Nasal washings and  aspirates are unacceptable for Xpert Xpress SARS-CoV-2/FLU/RSV  testing.  Fact Sheet for Patients: PinkCheek.be  Fact Sheet for Healthcare Providers: GravelBags.it  This test is not yet approved or cleared by the [place] FDA and  has been authorized for detection and/or diagnosis of SARS-CoV-2 by  FDA under an Emergency Use Authorization (EUA). This EUA will remain  in effect (meaning this test can be used) for the duration of the  Covid-19 declaration  under Section 564(b)(1) of the Act, 21  U.S.C. section 360bbb-3(b)(1), unless the authorization is  terminated or revoked. Performed at [facility], [address] [place], [place] [address]      Labs: BNP (last 3 results) No results for input(s): BNP in the last 8760 hours. Basic Metabolic Panel: Recent Labs  Lab 08/20/20 1218 08/20/20 1426 08/21/20 0050 08/22/20 0620 08/23/20 0606 08/24/20 0516  NA 132*  --  134*  135 137 137 139  K 2.8*  --  3.0*  3.1* 3.8 3.9 3.7  CL 92*  --  96*  96* 106 103 109  CO2 28  --  25  25 22 22 22   GLUCOSE 145*  --  260*  260* 138* 149* 127*  BUN 15  --  13  13 16 14 17   CREATININE  0.95  --  0.69  0.67 0.69 0.81 0.83  CALCIUM 9.1  --  9.1  9.1 8.7* 9.1 8.0*  MG  --  2.0 2.0 2.1 2.0 2.0  PHOS  --   --  4.0  --   --   --    Liver Function Tests: Recent Labs  Lab 08/20/20 1218 08/21/20 0050 08/22/20 0620 08/23/20 0606 08/24/20 0516  AST 349* 433* 426* 208* 64*  ALT 473* 470* 439* 369* 206*  ALKPHOS 903* 927* 837* 885* 610*  BILITOT 7.3* 12.0* 13.0* 4.7* 2.6*  PROT 7.5 7.5 6.5 7.3 5.9*  ALBUMIN 3.8 3.7 3.1* 3.6 3.0*   Recent Labs  Lab 08/20/20 1426  LIPASE 25   No results for input(s): AMMONIA in the last 168 hours. CBC: Recent Labs  Lab 08/20/20 1218 08/21/20 0050 08/22/20 0620 08/23/20 0606 08/24/20 0516  WBC 9.1 9.6 8.9 12.1* 10.7*  NEUTROABS  --  6.0 5.3 9.5* 6.5  HGB 12.4 12.6 11.4* 11.8* 10.1*  HCT 37.0 37.7 35.2* 36.1 32.2*  MCV 80.3 81.8 83.8 83.0 86.8  PLT 366 380 339 386 326   Cardiac Enzymes: No results for input(s): CKTOTAL, CKMB, CKMBINDEX, TROPONINI in the last 168 hours. BNP: Invalid input(s): POCBNP CBG: Recent Labs  Lab 08/23/20 1255 08/23/20 1645 08/23/20 2356 08/24/20 0603 08/24/20 1150  GLUCAP 175* 215* 198* 121* 95   D-Dimer No results for input(s): DDIMER in the last 72 hours. Hgb A1c No results for input(s): HGBA1C in the last 72 hours. Lipid Profile No results for input(s): CHOL, HDL, LDLCALC, TRIG, CHOLHDL, LDLDIRECT in the last 72 hours. Thyroid function studies No results for input(s): TSH, T4TOTAL, T3FREE, THYROIDAB in the last 72 hours.  Invalid input(s): FREET3 Anemia work up No results for input(s): VITAMINB12, FOLATE, FERRITIN, TIBC, IRON, RETICCTPCT in the last 72 hours. Urinalysis    Component Value Date/Time   COLORURINE YELLOW 08/20/2020 1218   APPEARANCEUR CLEAR 08/20/2020 1218   LABSPEC <1.005 (L) 08/20/2020 1218   PHURINE 6.5 08/20/2020 [place] 08/20/2020 1218   HGBUR TRACE (A) 08/20/2020 1218   BILIRUBINUR NEGATIVE 08/20/2020 [place] 08/20/2020  1218   PROTEINUR NEGATIVE 08/20/2020 1218   NITRITE NEGATIVE 08/20/2020 1218   LEUKOCYTESUR NEGATIVE 08/20/2020 1218   Sepsis Labs Invalid input(s): PROCALCITONIN,  WBC,  LACTICIDVEN Microbiology Recent Results (from the past 240 hour(s))  Respiratory Panel by RT PCR (Flu A&B, Covid) - Nasopharyngeal Swab     Status: None   Collection Time: 08/20/20  3:40 PM   Specimen: Nasopharyngeal Swab  Result Value Ref Range Status   SARS Coronavirus 2  by RT PCR NEGATIVE NEGATIVE Final    Comment: (NOTE) SARS-CoV-2 target nucleic acids are NOT DETECTED.  The SARS-CoV-2 RNA is generally detectable in upper respiratoy specimens during the acute phase of infection. The lowest concentration of SARS-CoV-2 viral copies this assay can detect is 131 copies/mL. A negative result does not preclude SARS-Cov-2 infection and should not be used as the sole basis for treatment or other patient management decisions. A negative result may occur with  improper specimen collection/handling, submission of specimen other than nasopharyngeal swab, presence of viral mutation(s) within the areas targeted by this assay, and inadequate number of viral copies (<131 copies/mL). A negative result must be combined with clinical observations, patient history, and epidemiological information. The expected result is Negative.  Fact Sheet for Patients:  PinkCheek.be  Fact Sheet for Healthcare Providers:  GravelBags.it  This test is no t yet approved or cleared by the [place] FDA and  has been authorized for detection and/or diagnosis of SARS-CoV-2 by FDA under an Emergency Use Authorization (EUA). This EUA will remain  in effect (meaning this test can be used) for the duration of the COVID-19 declaration under Section 564(b)(1) of the Act, 21 U.S.C. section 360bbb-3(b)(1), unless the authorization is terminated or revoked sooner.     Influenza A by PCR  NEGATIVE NEGATIVE Final   Influenza B by PCR NEGATIVE NEGATIVE Final    Comment: (NOTE) The Xpert Xpress SARS-CoV-2/FLU/RSV assay is intended as an aid in  the diagnosis of influenza from Nasopharyngeal swab specimens and  should not be used as a sole basis for treatment. Nasal washings and  aspirates are unacceptable for Xpert Xpress SARS-CoV-2/FLU/RSV  testing.  Fact Sheet for Patients: PinkCheek.be  Fact Sheet for Healthcare Providers: GravelBags.it  This test is not yet approved or cleared by the [place] FDA and  has been authorized for detection and/or diagnosis of SARS-CoV-2 by  FDA under an Emergency Use Authorization (EUA). This EUA will remain  in effect (meaning this test can be used) for the duration of the  Covid-19 declaration under Section 564(b)(1) of the Act, 21  U.S.C. section 360bbb-3(b)(1), unless the authorization is  terminated or revoked. Performed at [facility], [address] [place], [place] [address]    Time spent: 30 min  SIGNED:   Marylu Lund, MD  [facility] 08/24/2020, 3:00 PM  If 7PM-7AM, please contact night-coverage

## 2020-08-24 NOTE — Anesthesia Preprocedure Evaluation (Signed)
Anesthesia Evaluation  Patient identified by MRN, date of birth, ID band Patient awake    Reviewed: Allergy & Precautions, NPO status , Patient's Chart, lab work & pertinent test results  Airway Mallampati: I  TM Distance: >3 FB Neck ROM: Full    Dental  (+) Teeth Intact, Dental Advisory Given   Pulmonary    Pulmonary exam normal        Cardiovascular hypertension, Pt. on medications  Rhythm:Regular Rate:Normal     Neuro/Psych    GI/Hepatic Neg liver ROS, GERD  Medicated,  Endo/Other  diabetes  Renal/GU negative Renal ROS     Musculoskeletal   Abdominal Normal abdominal exam  (+)   Peds  Hematology   Anesthesia Other Findings   Reproductive/Obstetrics                             Anesthesia Physical Anesthesia Plan  ASA: II  Anesthesia Plan: MAC   Post-op Pain Management:    Induction: Intravenous  PONV Risk Score and Plan: 0 and Propofol infusion  Airway Management Planned: Natural Airway and Simple Face Mask  Additional Equipment: None  Intra-op Plan:   Post-operative Plan:   Informed Consent: I have reviewed the patients History and Physical, chart, labs and discussed the procedure including the risks, benefits and alternatives for the proposed anesthesia with the patient or authorized representative who has indicated his/her understanding and acceptance.       Plan Discussed with: CRNA  Anesthesia Plan Comments:         Anesthesia Quick Evaluation

## 2020-08-24 NOTE — Transfer of Care (Signed)
Immediate Anesthesia Transfer of Care Note  Patient: Jacqueline Tyler  Procedure(s) Performed: ESOPHAGOGASTRODUODENOSCOPY (EGD) WITH PROPOFOL (N/A ) UPPER ESOPHAGEAL ENDOSCOPIC ULTRASOUND (EUS) (N/A ) FINE NEEDLE ASPIRATION (FNA) LINEAR (N/A )  Patient Location: PACU and Endoscopy Unit  Anesthesia Type:MAC  Level of Consciousness: awake and alert   Airway & Oxygen Therapy: Patient Spontanous Breathing and Patient connected to face mask oxygen  Post-op Assessment: Report given to RN and Post -op Vital signs reviewed and stable  Post vital signs: Reviewed and stable  Last Vitals:  Vitals Value Taken Time  BP    Temp    Pulse    Resp    SpO2      Last Pain:  Vitals:   08/24/20 0917  TempSrc: Axillary  PainSc: 0-No pain         Complications: No complications documented.

## 2020-08-25 LAB — CYTOLOGY - NON PAP

## 2020-08-25 NOTE — Progress Notes (Signed)
Spoke with patient regarding referral we received from Dr. Paulita Fujita at [place] for newly diagnosed pancreatic adenocarcinoma.  I introduced myself and explained my role as nurse navigator.  I have offered her an appointment for medical oncology consult on Tuesday 08/30/2020 at 2 pm with Dr. Truitt Merle, to arrive at least 20 minutes prior for registration.  She is aware of our location and has agreed to this date and time. She was given my direct contact number.

## 2020-08-25 NOTE — Treatment Plan (Signed)
Biopsy result noted to be positive for adenocarcinoma. Have called and notified primary care provider who will arrange hospital follow up to discuss findings. Have recommended Oncology and Surgical referral to primary provider

## 2020-08-26 NOTE — Progress Notes (Signed)
[facility]   [phone] Fax:([phone] [place] Note   Patient Care Team: Nicola Girt, DO as PCP - General (Internal Medicine) Jonnie Finner, RN as Oncology Nurse Navigator Truitt Merle, MD as Consulting Physician (Oncology)  Date of Service:  08/30/2020   CHIEF COMPLAINTS/PURPOSE OF CONSULTATION:  Newly Diagnosed Pancreatic Cancer   REFERRING PHYSICIAN:  Dr Paulita Fujita  Oncology History Overview Note  Cancer Staging Pancreatic cancer [facility]) Staging form: Exocrine Pancreas, AJCC 8th Edition - Clinical stage from 08/24/2020: Stage IIB (cT2, cN1, cM0) - Signed by Truitt Merle, MD on 08/30/2020    Pancreatic cancer ([place])  08/20/2020 Imaging   CT AP 08/20/20  IMPRESSION: 1. 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 Collateral circulation is noted. There is moderate intrahepatic and extrahepatic biliary dilatation which appears to be due to the pancreatic head mass. Pancreatic ductal dilatation is noted as well. 2. Probable 2.7 cm uterine fibroid. 3. Aortic atherosclerosis.   Aortic Atherosclerosis (ICD10-I70.0).   08/20/2020 Tumor Marker   Ca19-9 - 927   08/22/2020 Procedure   ERCP by Dr Watt Climes 08/22/20  IMPRESSION - The major papilla appeared normal. - A biliary sphincterotomy was performed. - Cells for cytology obtained in the lower third and middle of the main duct. - One plastic stent was placed into the common bile duct.   FINAL MICROSCOPIC DIAGNOSIS:  - No malignant cells identified  - Benign reactive/reparative changes   08/24/2020 Cancer Staging   Staging form: Exocrine Pancreas, AJCC 8th Edition - Clinical stage from 08/24/2020: Stage IIB (cT2, cN1, cM0) - Signed by Truitt Merle, MD on 08/30/2020   08/24/2020 Procedure   EUS by Dr Paulita Fujita  08/24/20  IMPRESSION - There was no sign of significant pathology in the ampulla. - A few malignant-appearing lymph nodes were visualized in the peripancreatic region and porta hepatis region. - One stent was visualized endosonographically in the common bile duct. - A mass was identified in the pancreatic head. Tissue was obtained from this exam. The preliminary diagnosis is consistent with adenocarcinoma. Invasion into SMV/PV seen. Lymphadenopathy noted. This was staged T3 N1 Mx by endosonographic criteria. Fine needle aspiration performed.   08/24/2020 Initial Biopsy   FINAL MICROSCOPIC DIAGNOSIS: 08/24/20 - Malignant cells consistent with adenocarcinoma    08/30/2020 Initial Diagnosis   Pancreatic cancer (HCC)    Chemotherapy   PENDING Neoadjuvant FOLFIRINOX q2weeks starting in 1-2 weeks       HISTORY OF PRESENTING ILLNESS:  Jacqueline Tyler 59 y.o. female is a here because of pancreatic cancer. The patient was referred by Dr Paulita Fujita. The patient presents to the clinic today accompanied by her husband. She notes she had a friend who is a Futures trader in [place] at [place].   1 week before hospitalization she had N&V episode which she attributed to stomach bug. 2-3 days before hospitalization she had Jaundice of skin. She denies abdominal pain or jaundice of eyes. She denies recent weight loss, appetite, or fatigue. She overall feel baseline. Her hospital work up showed ERCP for CBD stent placement and was seen to have pancreatic cancer on EUS.   Her 07/2020 physical with PCP office but was seen by a PA and did not fully ask all her questions. No labs were done at that  time but did labs in 06/2020 and per patient those labs were normal. She notes her last COVID19 vaccine series was in late September. She is due for flu shot.   Socially she is married. She had 1 adult son. She did not drink alcohol, use nicotine or use recreational drugs. 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 She notes she needs to work for money purposes. Her husband has recently retired, but may return to work in the near future. She has a PMHx of HTN on HCTZ, Clonidine, Zocor. She denies family history of cancer. I reviewed her medication list. She is on Celexa $RemoveB'10mg'ZlWRBHnH$  for depression.   REVIEW OF SYSTEMS:    Constitutional: Denies fevers, chills or abnormal night sweats Eyes: Denies blurriness of vision, double vision or watery eyes Ears, nose, mouth, throat, and face: Denies mucositis or sore throat Respiratory: Denies cough, dyspnea or wheezes Cardiovascular: Denies palpitation, chest discomfort or lower extremity swelling Gastrointestinal:  Denies nausea, heartburn or change in bowel habits Skin: Denies abnormal skin rashes Lymphatics: Denies new lymphadenopathy or easy bruising Neurological:Denies numbness, tingling or new weaknesses Behavioral/Psych: Mood is stable, no new changes  All other systems were reviewed with the patient and are negative.   MEDICAL HISTORY:  Past Medical History:  Diagnosis Date  . High cholesterol   . Hypertension     SURGICAL HISTORY: Past Surgical History:  Procedure Laterality Date  . BILIARY BRUSHING  08/22/2020   Procedure: BILIARY BRUSHING;  Surgeon: Clarene Essex, MD;  Location: [facility];  Service: Endoscopy;;  . BILIARY STENT PLACEMENT N/A 08/22/2020   Procedure: BILIARY STENT PLACEMENT;  Surgeon: Clarene Essex, MD;  Location: [facility];  Service: Endoscopy;  Laterality: N/A;  . ENDOSCOPIC RETROGRADE CHOLANGIOPANCREATOGRAPHY (ERCP) WITH PROPOFOL N/A 08/22/2020   Procedure: ENDOSCOPIC RETROGRADE CHOLANGIOPANCREATOGRAPHY (ERCP) WITH PROPOFOL;  Surgeon: Clarene Essex, MD;  Location: [facility];  Service: Endoscopy;  Laterality: N/A;  . ESOPHAGOGASTRODUODENOSCOPY (EGD) WITH PROPOFOL N/A 08/24/2020   Procedure: ESOPHAGOGASTRODUODENOSCOPY (EGD) WITH PROPOFOL;  Surgeon: Arta Silence, MD;  Location: [facility];  Service: Endoscopy;   Laterality: N/A;  . FINE NEEDLE ASPIRATION N/A 08/24/2020   Procedure: FINE NEEDLE ASPIRATION (FNA) LINEAR;  Surgeon: Arta Silence, MD;  Location: [facility];  Service: Endoscopy;  Laterality: N/A;  . SPHINCTEROTOMY  08/22/2020   Procedure: SPHINCTEROTOMY;  Surgeon: Clarene Essex, MD;  Location: [facility];  Service: Endoscopy;;  . UPPER ESOPHAGEAL ENDOSCOPIC ULTRASOUND (EUS) N/A 08/24/2020   Procedure: UPPER ESOPHAGEAL ENDOSCOPIC ULTRASOUND (EUS);  Surgeon: Arta Silence, MD;  Location: [facility];  Service: Endoscopy;  Laterality: N/A;    SOCIAL HISTORY: Social History   Socioeconomic History  . Marital status: Married    Spouse name: Not on file  . Number of children: 1  . Years of education: Not on file  . Highest education level: Not on file  Occupational History  . Occupation: Medical illustrator   Tobacco Use  . Smoking status: Never Smoker  . Smokeless tobacco: Never Used  Substance and Sexual Activity  . Alcohol use: Never  . Drug use: Never  . Sexual activity: Not on file  Other Topics Concern  . Not on file  Social History Narrative  . Not on file   Social Determinants of Health   Financial Resource Strain:   . Difficulty of Paying Living Expenses: Not on file  Food Insecurity:   . Worried About Charity fundraiser in the Last Year: Not on file  . Ran Out of  Food in the Last Year: Not on file  Transportation Needs:   . Lack of Transportation (Medical): Not on file  . Lack of Transportation (Non-Medical): Not on file  Physical Activity:   . Days of Exercise per Week: Not on file  . Minutes of Exercise per Session: Not on file  Stress:   . Feeling of Stress : Not on file  Social Connections:   . Frequency of Communication with Friends and Family: Not on file  . Frequency of Social Gatherings with Friends and Family: Not on file  . Attends Religious Services: Not on file  . Active Member of Clubs or Organizations: Not on file  . Attends Theatre manager Meetings: Not on file  . Marital Status: Not on file  Intimate Partner Violence:   . Fear of Current or Ex-Partner: Not on file  . Emotionally Abused: Not on file  . Physically Abused: Not on file  . Sexually Abused: Not on file    FAMILY HISTORY: Family History  Problem Relation Age of Onset  . CAD Mother     ALLERGIES:  is allergic to oxycodone-acetaminophen, poison ivy extract, and tyloxapol.  MEDICATIONS:  Current Outpatient Medications  Medication Sig Dispense Refill  . acetaminophen (TYLENOL) 325 MG tablet Take 650 mg by mouth every 6 (six) hours as needed.    . citalopram (CELEXA) 40 MG tablet Take 10 mg by mouth daily.     . cloNIDine (CATAPRES) 0.1 MG tablet Take 0.1 mg by mouth at bedtime.    . Famotidine (PEPCID PO) Take 1 tablet by mouth daily as needed (acid reflux).    . hydrochlorothiazide (HYDRODIURIL) 12.5 MG tablet Take 12.5 mg by mouth daily.    . simvastatin (ZOCOR) 80 MG tablet Take 80 mg by mouth daily.     No current facility-administered medications for this visit.    PHYSICAL EXAMINATION: ECOG PERFORMANCE STATUS: 0 - Asymptomatic  Vitals:   08/30/20 1421  BP: (!) 115/51  Pulse: 70  Resp: 16  Temp: 98.4 F (36.9 C)  SpO2: 100%   Filed Weights   08/30/20 1421  Weight: 155 lb 6.4 oz (70.5 kg)    GENERAL:alert, no distress and comfortable SKIN: skin color, texture, turgor are normal, no rashes or significant lesions EYES: normal, Conjunctiva are pink and non-injected (+) Minimal Jaundice NECK: supple, thyroid normal size, non-tender, without nodularity LYMPH:  no palpable lymphadenopathy in the cervical, axillary  LUNGS: clear to auscultation and percussion with normal breathing effort HEART: regular rate & rhythm and no murmurs and no lower extremity edema ABDOMEN:abdomen soft, non-tender and normal bowel sounds Musculoskeletal:no cyanosis of digits and no clubbing  NEURO: alert & oriented x 3 with fluent speech, no focal  motor/sensory deficits  LABORATORY DATA:  I have reviewed the data as listed CBC Latest Ref Rng & Units 08/24/2020 08/23/2020 08/22/2020  WBC 4.0 - 10.5 K/uL 10.7(H) 12.1(H) 8.9  Hemoglobin 12.0 - 15.0 g/dL 10.1(L) 11.8(L) 11.4(L)  Hematocrit 36 - 46 % 32.2(L) 36.1 35.2(L)  Platelets 150 - 400 K/uL 326 386 339    CMP Latest Ref Rng & Units 08/24/2020 08/23/2020 08/22/2020  Glucose 70 - 99 mg/dL 127(H) 149(H) 138(H)  BUN 6 - 20 mg/dL $Remove'17 14 16  'eVuDUmz$ Creatinine 0.44 - 1.00 mg/dL 0.83 0.81 0.69  Sodium 135 - 145 mmol/L 139 137 137  Potassium 3.5 - 5.1 mmol/L 3.7 3.9 3.8  Chloride 98 - 111 mmol/L 109 103 106  CO2 22 - 32 mmol/L 22  22 22  Calcium 8.9 - 10.3 mg/dL 8.0(L) 9.1 8.7(L)  Total Protein 6.5 - 8.1 g/dL 5.9(L) 7.3 6.5  Total Bilirubin 0.3 - 1.2 mg/dL 2.6(H) 4.7(H) 13.0(H)  Alkaline Phos 38 - 126 U/L 610(H) 885(H) 837(H)  AST 15 - 41 U/L 64(H) 208(H) 426(H)  ALT 0 - 44 U/L 206(H) 369(H) 439(H)     RADIOGRAPHIC STUDIES: I have personally reviewed the radiological images as listed and agreed with the findings in the report. CT ABDOMEN PELVIS W CONTRAST  Result Date: 08/20/2020 CLINICAL DATA:  Jaundice. EXAM: CT ABDOMEN AND PELVIS WITH CONTRAST TECHNIQUE: Multidetector CT imaging of the abdomen and pelvis was performed using the standard protocol following bolus administration of intravenous contrast. CONTRAST:  138mL OMNIPAQUE IOHEXOL 300 MG/ML  SOLN COMPARISON:  January 16, 2006. FINDINGS: Lower chest: No acute abnormality. Hepatobiliary: No gallstones are noted. There is moderate intrahepatic and extrahepatic biliary dilatation which appears to be due to 3.3 x 2.2 cm low density mass in the pancreatic head consistent with malignancy. 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 This results in significant pancreatic ductal dilatation. This mass appears to be leading to occlusion of the superior mesenteric vein as well as the  proximal portion of the main portal vein. Collateral circulation is noted. Spleen: Normal in size without focal abnormality. Adrenals/Urinary Tract: Adrenal glands are unremarkable. Kidneys are normal, without renal calculi, focal lesion, or hydronephrosis. Bladder is unremarkable. Stomach/Bowel: The stomach appears normal. There is no evidence of bowel obstruction or inflammation. Vascular/Lymphatic: Aortic atherosclerosis. No enlarged abdominal or pelvic lymph nodes. Reproductive: Probable 2.7 cm uterine fibroid. No adnexal abnormality is noted. Other: No abdominal wall hernia or abnormality. No abdominopelvic ascites. Musculoskeletal: No acute or significant osseous findings. IMPRESSION: 1. 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 Collateral circulation is noted. There is moderate intrahepatic and extrahepatic biliary dilatation which appears to be due to the pancreatic head mass. Pancreatic ductal dilatation is noted as well. 2. Probable 2.7 cm uterine fibroid. 3. Aortic atherosclerosis. Aortic Atherosclerosis (ICD10-I70.0). Electronically Signed   By: Marijo Conception M.D.   On: 08/20/2020 15:04   DG ERCP BILIARY & PANCREATIC DUCTS  Result Date: 08/22/2020 CLINICAL DATA:  Obstructive jaundice EXAM: ERCP TECHNIQUE: Multiple spot images obtained with the fluoroscopic device and submitted for interpretation post-procedure. FLUOROSCOPY TIME:  Fluoroscopy Time:  3 minutes, 17 seconds Radiation Exposure Index (if provided by the fluoroscopic device): Not available Number of Acquired Spot Images: 6 COMPARISON:  CT abdomen pelvis from 08/20/2020 FINDINGS: The duodenoscope is positioned in the right upper quadrant with retrograde cannulation of the common bile duct. The biliary tree appears mildly dilated. Final image demonstrates  placement of a common bile duct stent. IMPRESSION: Mild dilation of the common bile duct. Placement of a common bile duct stent. These images were submitted for radiologic interpretation only. Please see the procedural report for the amount of contrast and the fluoroscopy time utilized. Electronically Signed   By: Ruthann Cancer MD   On: 08/22/2020 15:14    ASSESSMENT & PLAN:  KARRI KALLENBACH is a 59 y.o. Caucasian female with a history of HTN, High Cholesterol, Depression   1. 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  -She was recently hospitalized for obstructing jaundice, but otherwise asymptomatic. Her CT scan showed 3.3cm mass in head of pancrease with intrahepatic and extrahepatic biliary dilatation. She had CBD stent placed with ERCP on 08/22/20 to resolve her jaundice.  -Her 08/24/20 EUS with Dr Paulita Fujita showed her mass at head of pancreas with SMV and PV invasion, biopsy confirmed adenocarcinoma with few malignant-appearing LNs in the peripancreatic and porta hepatis region. Given the vascular invasion, this is a borderline resectable -I recommend CT chest to complete staging. I have low suspicion of lung involvement. She is agreeable.  -I discussed the natural course of pancreatic adenocarcinoma which is aggressive and it has very high risk of recurrence after surgery alone. I dicussed standard care for pancreatic cancer is chemotherapy and Whipple Surgery which is the only way to cure her disease. I discussed even with surgery she has high risk of recurrence more than 50%. I discussed non-metastatic pancreatic cancer has 30% chance of being cured.  -She plans to consult with surgeon Dr Zenia Resides on 09/06/20.  -I recommended neoadjuvant chemotherapy due to the borderline resectable disease. She is overall healthy and fit, I recommend neo-adjuvant chemotherapy with more intensive  FOLFIRINOX q2weeks to downstage her borderline resectable disease for 3-4 months before surgery with possible additional adjuvant chemo after surgery. Will scan her after 3 months treatment to monitor her response.  -Chemotherapy consent: Side effects including but does not limited to, fatigue, nausea, vomiting, diarrhea, hair loss, neuropathy, fluid retention, renal and kidney dysfunction, neutropenic fever, needed for blood transfusion, bleeding, were discussed with patient in great detail. She agrees to proceed. -The goal of neoadjuvant chemotherapy is curative. -I reviewed the indication for target radiation.  -I will discuss her case in next GI Tumor board.  -She would proceed with PAC placement, chemo education class before start of treatment. Plan to start in 2 weeks.  -I also recommend Genetic testing and Genomic testing of her biopsy sample to indicate any inheritable mutations or targetable mutations. She is agreeable.  -I discussed the option of participating in [employer] research study through one time blood draw. She agreed. -F/u with start of treatment.    2. Obstructive Jaundice, Transaminitis, Hyperbilirubinuria  -Secondary to #1  -Her initial 08/20/20 labs showed AST 349, ALT 473, alk phos 903 and tbili 7.3. She only had Jaundice of skin and eyes. Otherwise asymptomatic.  -Plastic Stent placed by ERCP on 08/22/20 with Dr Watt Climes. May need stent exchange in the future.  -Her 08/24/20 labs showed improved transaminitis and tbili 2.6. Will monitor.    3. HTN, Hyperglycemia  -During recent hospitalization for pancreatic cancer her 08/20/20 A1c was 7.1  -I recommend next labs should be fasting with repeated A1c. She may require medication down the road given her pancreatic cancer.  -On Zocor, Clonidine and HCTZ for HTN.     4. Financial assistance and Social support  -She notes concern about continuing work when she can for money. She works for [employer] from home. Her  husband recently retired but may return to work in near future.  -I encouraged her to use short term disability or FMLA as she will need to take time off work to recover and for treatment.  -I encouraged her to consult with financial advocate or our financial office for assistance.  -She is on Celexa $RemoveB'10mg'uPXyDEvg$  for her depression. Mood stable and given her strong [place] faith she notes  she has accepted her cancer diagnosis.  -I discussed SW and other resources available to her through [facility].    PLAN:  -Flu shot today  -refer for [employer] study -Send Genetic testing referral -Lab and CT Chest next week  -PAC placement in 1-2 weeks by Dr. Zenia Resides or IR  -Chemo education class in 1-2 weeks  -Consult with Dr Zenia Resides on 10/19, I will communicate with Dr. Zenia Resides this week  -Lab, Flush, F/u and Chemo FOLFIRINOX in 2 weeks.      Orders Placed This Encounter  Procedures  . CT Chest W Contrast    Standing Status:   Future    Standing Expiration Date:   08/30/2021    Order Specific Question:   If indicated for the ordered procedure, I authorize the administration of contrast media per Radiology protocol    Answer:   Yes    Order Specific Question:   Is patient pregnant?    Answer:   No    Order Specific Question:   Preferred imaging location?    Answer:   [facility]    All questions were answered. The patient knows to call the clinic with any problems, questions or concerns. The total time spent in the appointment was 60 minutes.     Truitt Merle, MD 08/30/2020 11:17 PM  I, Joslyn Devon, am acting as scribe for Truitt Merle, MD.   I have reviewed the above documentation for accuracy and completeness, and I agree with the above.

## 2020-08-29 ENCOUNTER — Encounter (HOSPITAL_COMMUNITY): Payer: Self-pay | Admitting: Gastroenterology

## 2020-08-30 ENCOUNTER — Inpatient Hospital Stay: Payer: 59 | Attending: Hematology | Admitting: Hematology

## 2020-08-30 ENCOUNTER — Other Ambulatory Visit: Payer: Self-pay

## 2020-08-30 ENCOUNTER — Encounter: Payer: Self-pay | Admitting: Radiology

## 2020-08-30 ENCOUNTER — Encounter: Payer: Self-pay | Admitting: Hematology

## 2020-08-30 VITALS — BP 115/51 | HR 70 | Temp 98.4°F | Resp 16 | Ht 62.0 in | Wt 155.4 lb

## 2020-08-30 DIAGNOSIS — Z23 Encounter for immunization: Secondary | ICD-10-CM | POA: Diagnosis not present

## 2020-08-30 DIAGNOSIS — F329 Major depressive disorder, single episode, unspecified: Secondary | ICD-10-CM | POA: Diagnosis not present

## 2020-08-30 DIAGNOSIS — I10 Essential (primary) hypertension: Secondary | ICD-10-CM | POA: Diagnosis not present

## 2020-08-30 DIAGNOSIS — C250 Malignant neoplasm of head of pancreas: Secondary | ICD-10-CM

## 2020-08-30 DIAGNOSIS — Z79899 Other long term (current) drug therapy: Secondary | ICD-10-CM | POA: Insufficient documentation

## 2020-08-30 DIAGNOSIS — E7800 Pure hypercholesterolemia, unspecified: Secondary | ICD-10-CM | POA: Diagnosis not present

## 2020-08-30 DIAGNOSIS — R590 Localized enlarged lymph nodes: Secondary | ICD-10-CM | POA: Insufficient documentation

## 2020-08-30 DIAGNOSIS — Z5111 Encounter for antineoplastic chemotherapy: Secondary | ICD-10-CM | POA: Diagnosis not present

## 2020-08-30 DIAGNOSIS — C259 Malignant neoplasm of pancreas, unspecified: Secondary | ICD-10-CM | POA: Insufficient documentation

## 2020-08-30 MED ORDER — INFLUENZA VAC SPLIT QUAD 0.5 ML IM SUSY
0.5000 mL | PREFILLED_SYRINGE | Freq: Once | INTRAMUSCULAR | Status: AC
  Administered 2020-08-30: 0.5 mL via INTRAMUSCULAR

## 2020-08-30 MED ORDER — INFLUENZA VAC SPLIT QUAD 0.5 ML IM SUSY
PREFILLED_SYRINGE | INTRAMUSCULAR | Status: AC
  Filled 2020-08-30: qty 0.5

## 2020-08-30 NOTE — Research (Signed)
EXACT SCIENCES,BLOOD SAMPLE COLLECTION TO EVALUATE BIOMARKERS IN SUBJECTS WITH UNTREATED SOLID TUMORS  08/30/2020    3:30PM  CONSENT: Met withLaura and her husband following her consult with Dr. Burr Medico for 30 minutes in a private exam room accompanied by Clabe Seal, Clinical Research Coordinator.We reviewedtheExact Sciencesconsent (protocol version date3.0 dated 10-24-2019andHIPPAform(protocol version date3.0 dated 09-11-2018)with patient in their entirety. Explained the purpose of the study along with potential risks and benefits of participation. Reviewed the study requiredquestionnairesand timeline for completing thesequestionnaires. Informed patient that participation is completely voluntary andshemay withdraw consent at any time, but once blood is drawn, it will be retained by [employer].Upon completion ofreview, Kahlia and her husbandwereoffered to ask any questions or express any concerns.They had no questions at this time. Laurasignedand dated the consentand HIPPA form voluntarily. A copy of the signed consent and HIPPA forms were given to patient.Eligibility was confirmed by myself and verified by Doristine Johns, RN, Clinical Research Coordinator. Thanked patient for her time and her willingness to participate.   PLAN: Plan is to call patient in a couple of days to complete CRF forms and I will look for lab work to be ordered by Dr. Burr Medico in order to collect research labs.   Carol Ada, RT(R)(T) Clinical Research Coordinator

## 2020-08-30 NOTE — Research (Signed)
EXACT SCIENCES SUB STUDY,STOOL SAMPLE COLLECTION TO EVALUATE BIOMARKERS IN SUBJECTS WITH UNTREATED SOLID TUMORS  10/12/213:30PM  VISIT: After signing consent and HIPPA for 2018-01 Blood collection study, I introduced the sub-study opportunity. After a brief discussion of the purpose of the study and what it would entail, Jacqueline Tyler decided she is not interested in participating. I expressed my understanding and thanked her for her time.   [place] RT(R)(T) Scientist, physiological

## 2020-08-31 ENCOUNTER — Other Ambulatory Visit: Payer: Self-pay | Admitting: Hematology

## 2020-08-31 ENCOUNTER — Ambulatory Visit: Payer: Self-pay | Admitting: Surgery

## 2020-08-31 ENCOUNTER — Encounter: Payer: Self-pay | Admitting: Hematology

## 2020-08-31 DIAGNOSIS — C250 Malignant neoplasm of head of pancreas: Secondary | ICD-10-CM

## 2020-08-31 MED ORDER — PROCHLORPERAZINE MALEATE 10 MG PO TABS: 10.0000 mg | ORAL_TABLET | Freq: Four times a day (QID) | ORAL | 1 refills | Status: DC | PRN

## 2020-08-31 MED ORDER — ONDANSETRON HCL 8 MG PO TABS: 8.0000 mg | ORAL_TABLET | Freq: Two times a day (BID) | ORAL | 1 refills | Status: DC | PRN

## 2020-08-31 MED ORDER — LIDOCAINE-PRILOCAINE 2.5-2.5 % EX CREA: TOPICAL_CREAM | CUTANEOUS | 3 refills | Status: DC

## 2020-08-31 NOTE — Progress Notes (Signed)
Received call from patient regarding assistance.  Introduced myself as Arboriculturist and listened to her concerns. Answered the questions she had and referred her to her insurance company for any specific plan detail such as ded/OOP which she was unsure.  Advised once her treatment plan has been established, her name will appear on my list to reach out to regarding any possible assistance she may apply for during chemotherapy. Also advised to be prepared to provide income to apply for programs. She verbalized understanding.  She has my card for any additional financial questions or concerns.

## 2020-08-31 NOTE — Progress Notes (Signed)
START ON PATHWAY REGIMEN - Pancreatic Adenocarcinoma     A cycle is every 14 days:     Oxaliplatin      Leucovorin      Irinotecan      Fluorouracil   **Always confirm dose/schedule in your pharmacy ordering system**  Patient Characteristics: Preoperative (Clinical Staging), Borderline Resectable, PS = 0,1, BRCA1/2 and PALB2 Mutation Absent/Unknown Therapeutic Status: Preoperative (Clinical Staging) AJCC T Category: cT2 AJCC N Category: cN1 Resectability Status: Borderline Resectable AJCC M Category: cM0 AJCC 8 Stage Grouping: IIB ECOG Performance Status: 0 BRCA1/2 Mutation Status: Awaiting Test Results PALB2 Mutation Status: Awaiting Test Results Intent of Therapy: Curative Intent, Discussed with Patient

## 2020-09-01 ENCOUNTER — Telehealth: Payer: Self-pay | Admitting: Hematology

## 2020-09-01 ENCOUNTER — Encounter: Payer: Self-pay | Admitting: Radiology

## 2020-09-01 DIAGNOSIS — C250 Malignant neoplasm of head of pancreas: Secondary | ICD-10-CM

## 2020-09-01 NOTE — Research (Signed)
EXACT SCIENCES,BLOOD SAMPLE COLLECTION TO EVALUATE BIOMARKERS IN SUBJECTS WITH UNTREATED SOLID TUMORS  09/01/2020     11:09 AM  PHONE CALL: Confirmed I was speaking with Jacqueline Tyler. Introduced the reason for calling was to complete [employer] Asked patient CRF questions and completed as answered by patient. Informed her that we would be collecting the blood sample on the same day as her lab appointment on 10/25 just prior to her first treatment. I verified patient did not have any questions or concerns and thanked her for her time. I look forward to seeing her on 10/25.   Carol Ada, RT(R)(T) Clinical Research Coordinator

## 2020-09-01 NOTE — Telephone Encounter (Signed)
Scheduled appt per 10/13 sch msg - pt is aware of appt for chemo edu and first chemo on 10/25

## 2020-09-01 NOTE — Telephone Encounter (Signed)
No 10/12 los 

## 2020-09-04 NOTE — Progress Notes (Signed)
Pharmacist Chemotherapy Monitoring - Initial Assessment    Anticipated start date: 09/12/20  Regimen:   Are orders appropriate based on the patients diagnosis, regimen, and cycle? Yes  Does the plan date match the patients scheduled date? Yes  Is the sequencing of drugs appropriate? Yes  Are the premedications appropriate for the patients regimen? Yes  Prior Authorization for treatment is: Approved o If applicable, is the correct biosimilar selected based on the patient's insurance? Yes  Organ Function and Labs:  Are dose adjustments needed based on the patient's renal function, hepatic function, or hematologic function? Yes  Are appropriate labs ordered prior to the start of patient's treatment? Yes  Other organ system assessment, if indicated: N/A  The following baseline labs, if indicated, have been ordered: N/A  Dose Assessment:  Are the drug doses appropriate? Yes  Are the following correct: o Drug concentrations Yes o IV fluid compatible with drug Yes o Administration routes Yes o Timing of therapy Yes  If applicable, does the patient have documented access for treatment and/or plans for port-a-cath placement? yes  If applicable, have lifetime cumulative doses been properly documented and assessed? yes Lifetime Dose Tracking  No doses have been documented on this patient for the following tracked chemicals: Doxorubicin, Epirubicin, Idarubicin, Daunorubicin, Mitoxantrone, Bleomycin, Oxaliplatin, Carboplatin, Liposomal Doxorubicin  o   Toxicity Monitoring/Prevention:  The patient has the following take home antiemetics prescribed: Ondansetron and Prochlorperazine  The patient has the following take home medications prescribed: N/A  Medication allergies and previous infusion related reactions, if applicable, have been reviewed and addressed. Yes  The patient's current medication list has been assessed for drug-drug interactions with their chemotherapy  regimen. no significant drug-drug interactions were identified on review.  Order Review:  Are the treatment plan orders signed? Yes  Is the patient scheduled to see a provider prior to their treatment? Yes  I verify that I have reviewed each item in the above checklist and answered each question accordingly.   Kennith Center, Pharm.D., CPP 09/04/2020@8 :52 AM

## 2020-09-05 ENCOUNTER — Encounter (HOSPITAL_COMMUNITY): Payer: Self-pay

## 2020-09-05 ENCOUNTER — Other Ambulatory Visit: Payer: Self-pay

## 2020-09-05 ENCOUNTER — Ambulatory Visit (HOSPITAL_COMMUNITY)
Admission: RE | Admit: 2020-09-05 | Discharge: 2020-09-05 | Disposition: A | Discharge status: Home / Self Care | Payer: 59 | Source: Ambulatory Visit | Attending: Hematology | Admitting: Hematology

## 2020-09-05 ENCOUNTER — Inpatient Hospital Stay: Payer: 59

## 2020-09-05 ENCOUNTER — Telehealth: Payer: Self-pay

## 2020-09-05 ENCOUNTER — Encounter (HOSPITAL_BASED_OUTPATIENT_CLINIC_OR_DEPARTMENT_OTHER): Payer: Self-pay | Admitting: Surgery

## 2020-09-05 ENCOUNTER — Other Ambulatory Visit: Payer: Self-pay | Admitting: *Deleted

## 2020-09-05 DIAGNOSIS — Z006 Encounter for examination for normal comparison and control in clinical research program: Secondary | ICD-10-CM

## 2020-09-05 DIAGNOSIS — T451X5A Adverse effect of antineoplastic and immunosuppressive drugs, initial encounter: Secondary | ICD-10-CM

## 2020-09-05 DIAGNOSIS — C250 Malignant neoplasm of head of pancreas: Secondary | ICD-10-CM

## 2020-09-05 MED ORDER — IOHEXOL 300 MG/ML  SOLN
75.0000 mL | Freq: Once | INTRAMUSCULAR | Status: AC | PRN
  Administered 2020-09-05: 75 mL via INTRAVENOUS

## 2020-09-05 MED ORDER — ZIEXTENZO 6 MG/0.6ML ~~LOC~~ SOSY: 6.0000 mg | PREFILLED_SYRINGE | Freq: Once | SUBCUTANEOUS | 12 refills | Status: AC

## 2020-09-05 NOTE — Telephone Encounter (Signed)
Neutropenic enrollment form and prescription for Zietenzo faxed to [facility].

## 2020-09-06 ENCOUNTER — Encounter (HOSPITAL_BASED_OUTPATIENT_CLINIC_OR_DEPARTMENT_OTHER)
Admission: RE | Admit: 2020-09-06 | Discharge: 2020-09-06 | Disposition: A | Discharge status: Home / Self Care | Payer: 59 | Source: Ambulatory Visit | Attending: Surgery | Admitting: Surgery

## 2020-09-06 ENCOUNTER — Other Ambulatory Visit (HOSPITAL_COMMUNITY)
Admission: RE | Admit: 2020-09-06 | Discharge: 2020-09-06 | Disposition: A | Discharge status: Home / Self Care | Payer: 59 | Source: Ambulatory Visit | Attending: Surgery | Admitting: Surgery

## 2020-09-06 ENCOUNTER — Other Ambulatory Visit: Payer: Self-pay

## 2020-09-06 DIAGNOSIS — Z20822 Contact with and (suspected) exposure to covid-19: Secondary | ICD-10-CM | POA: Insufficient documentation

## 2020-09-06 DIAGNOSIS — Z01812 Encounter for preprocedural laboratory examination: Secondary | ICD-10-CM | POA: Insufficient documentation

## 2020-09-06 DIAGNOSIS — Z01818 Encounter for other preprocedural examination: Secondary | ICD-10-CM | POA: Diagnosis present

## 2020-09-06 DIAGNOSIS — D701 Agranulocytosis secondary to cancer chemotherapy: Secondary | ICD-10-CM

## 2020-09-06 DIAGNOSIS — C250 Malignant neoplasm of head of pancreas: Secondary | ICD-10-CM

## 2020-09-06 DIAGNOSIS — T451X5A Adverse effect of antineoplastic and immunosuppressive drugs, initial encounter: Secondary | ICD-10-CM

## 2020-09-06 LAB — SARS CORONAVIRUS 2 (TAT 6-24 HRS): SARS Coronavirus 2: NEGATIVE

## 2020-09-06 MED ORDER — ZIEXTENZO 6 MG/0.6ML ~~LOC~~ SOSY: 6.0000 mg | PREFILLED_SYRINGE | Freq: Once | SUBCUTANEOUS | 12 refills | Status: AC

## 2020-09-06 NOTE — Progress Notes (Signed)
Surgical soap given with instructions, pt verbalized understanding.  

## 2020-09-07 ENCOUNTER — Other Ambulatory Visit: Payer: Self-pay

## 2020-09-08 ENCOUNTER — Other Ambulatory Visit: Payer: Self-pay | Admitting: Hematology

## 2020-09-08 ENCOUNTER — Ambulatory Visit (HOSPITAL_COMMUNITY): Payer: 59

## 2020-09-08 ENCOUNTER — Encounter (HOSPITAL_BASED_OUTPATIENT_CLINIC_OR_DEPARTMENT_OTHER): Payer: Self-pay | Admitting: Surgery

## 2020-09-08 ENCOUNTER — Ambulatory Visit (HOSPITAL_BASED_OUTPATIENT_CLINIC_OR_DEPARTMENT_OTHER): Payer: 59 | Admitting: Certified Registered Nurse Anesthetist

## 2020-09-08 ENCOUNTER — Ambulatory Visit (HOSPITAL_BASED_OUTPATIENT_CLINIC_OR_DEPARTMENT_OTHER)
Admission: RE | Admit: 2020-09-08 | Discharge: 2020-09-08 | Disposition: A | Discharge status: Home / Self Care | Payer: 59 | Attending: Surgery | Admitting: Surgery

## 2020-09-08 ENCOUNTER — Other Ambulatory Visit: Payer: Self-pay

## 2020-09-08 ENCOUNTER — Encounter (HOSPITAL_BASED_OUTPATIENT_CLINIC_OR_DEPARTMENT_OTHER)
Admission: RE | Disposition: A | Discharge status: Home / Self Care | Payer: Self-pay | Source: Home / Self Care | Attending: Surgery

## 2020-09-08 DIAGNOSIS — E7800 Pure hypercholesterolemia, unspecified: Secondary | ICD-10-CM | POA: Insufficient documentation

## 2020-09-08 DIAGNOSIS — Z79899 Other long term (current) drug therapy: Secondary | ICD-10-CM | POA: Diagnosis not present

## 2020-09-08 DIAGNOSIS — C250 Malignant neoplasm of head of pancreas: Secondary | ICD-10-CM | POA: Diagnosis not present

## 2020-09-08 DIAGNOSIS — I10 Essential (primary) hypertension: Secondary | ICD-10-CM | POA: Insufficient documentation

## 2020-09-08 DIAGNOSIS — Z95828 Presence of other vascular implants and grafts: Secondary | ICD-10-CM

## 2020-09-08 DIAGNOSIS — F419 Anxiety disorder, unspecified: Secondary | ICD-10-CM | POA: Insufficient documentation

## 2020-09-08 DIAGNOSIS — Z885 Allergy status to narcotic agent status: Secondary | ICD-10-CM | POA: Diagnosis not present

## 2020-09-08 DIAGNOSIS — E119 Type 2 diabetes mellitus without complications: Secondary | ICD-10-CM | POA: Diagnosis not present

## 2020-09-08 DIAGNOSIS — Z8249 Family history of ischemic heart disease and other diseases of the circulatory system: Secondary | ICD-10-CM | POA: Insufficient documentation

## 2020-09-08 DIAGNOSIS — F329 Major depressive disorder, single episode, unspecified: Secondary | ICD-10-CM | POA: Diagnosis not present

## 2020-09-08 DIAGNOSIS — Z888 Allergy status to other drugs, medicaments and biological substances status: Secondary | ICD-10-CM | POA: Diagnosis not present

## 2020-09-08 DIAGNOSIS — Z419 Encounter for procedure for purposes other than remedying health state, unspecified: Secondary | ICD-10-CM

## 2020-09-08 HISTORY — PX: PORTACATH PLACEMENT: SHX2246

## 2020-09-08 HISTORY — DX: Depression, unspecified: F32.A

## 2020-09-08 HISTORY — DX: Anxiety disorder, unspecified: F41.9

## 2020-09-08 SURGERY — INSERTION, TUNNELED CENTRAL VENOUS DEVICE, WITH PORT
Anesthesia: General | Site: Chest | Laterality: Right

## 2020-09-08 MED ORDER — CEFAZOLIN SODIUM-DEXTROSE 2-4 GM/100ML-% IV SOLN
2.0000 g | INTRAVENOUS | Status: AC
  Administered 2020-09-08: 2 g via INTRAVENOUS

## 2020-09-08 MED ORDER — CHLORHEXIDINE GLUCONATE CLOTH 2 % EX PADS: 6.0000 | MEDICATED_PAD | Freq: Once | CUTANEOUS | Status: DC

## 2020-09-08 MED ORDER — HEPARIN (PORCINE) IN NACL 1000-0.9 UT/500ML-% IV SOLN
INTRAVENOUS | Status: AC
  Filled 2020-09-08: qty 500

## 2020-09-08 MED ORDER — HEPARIN SOD (PORK) LOCK FLUSH 100 UNIT/ML IV SOLN
INTRAVENOUS | Status: AC
  Filled 2020-09-08: qty 5

## 2020-09-08 MED ORDER — EPHEDRINE 5 MG/ML INJ
INTRAVENOUS | Status: AC
  Filled 2020-09-08: qty 10

## 2020-09-08 MED ORDER — LIDOCAINE 2% (20 MG/ML) 5 ML SYRINGE
INTRAMUSCULAR | Status: AC
  Filled 2020-09-08: qty 5

## 2020-09-08 MED ORDER — FENTANYL CITRATE (PF) 100 MCG/2ML IJ SOLN: 25.0000 ug | INTRAMUSCULAR | Status: DC | PRN

## 2020-09-08 MED ORDER — MIDAZOLAM HCL 5 MG/5ML IJ SOLN
INTRAMUSCULAR | Status: DC | PRN
  Administered 2020-09-08: 2 mg via INTRAVENOUS

## 2020-09-08 MED ORDER — ONDANSETRON HCL 4 MG/2ML IJ SOLN: 4.0000 mg | Freq: Once | INTRAMUSCULAR | Status: DC | PRN

## 2020-09-08 MED ORDER — DEXAMETHASONE SODIUM PHOSPHATE 10 MG/ML IJ SOLN
INTRAMUSCULAR | Status: AC
  Filled 2020-09-08: qty 1

## 2020-09-08 MED ORDER — ONDANSETRON HCL 4 MG/2ML IJ SOLN
INTRAMUSCULAR | Status: DC | PRN
  Administered 2020-09-08: 4 mg via INTRAVENOUS

## 2020-09-08 MED ORDER — MIDAZOLAM HCL 2 MG/2ML IJ SOLN
INTRAMUSCULAR | Status: AC
  Filled 2020-09-08: qty 2

## 2020-09-08 MED ORDER — PROPOFOL 10 MG/ML IV BOLUS
INTRAVENOUS | Status: DC | PRN
  Administered 2020-09-08: 120 mg via INTRAVENOUS

## 2020-09-08 MED ORDER — PROPOFOL 10 MG/ML IV BOLUS
INTRAVENOUS | Status: AC
  Filled 2020-09-08: qty 20

## 2020-09-08 MED ORDER — BUPIVACAINE HCL 0.25 % IJ SOLN
INTRAMUSCULAR | Status: DC | PRN
  Administered 2020-09-08: 6 mL

## 2020-09-08 MED ORDER — LIDOCAINE HCL (CARDIAC) PF 100 MG/5ML IV SOSY
PREFILLED_SYRINGE | INTRAVENOUS | Status: DC | PRN
  Administered 2020-09-08: 70 mg via INTRATRACHEAL

## 2020-09-08 MED ORDER — FENTANYL CITRATE (PF) 100 MCG/2ML IJ SOLN
INTRAMUSCULAR | Status: DC | PRN
  Administered 2020-09-08: 50 ug via INTRAVENOUS

## 2020-09-08 MED ORDER — FENTANYL CITRATE (PF) 100 MCG/2ML IJ SOLN
INTRAMUSCULAR | Status: AC
  Filled 2020-09-08: qty 2

## 2020-09-08 MED ORDER — LACTATED RINGERS IV SOLN: INTRAVENOUS | Status: DC

## 2020-09-08 MED ORDER — DEXAMETHASONE SODIUM PHOSPHATE 10 MG/ML IJ SOLN
INTRAMUSCULAR | Status: DC | PRN
  Administered 2020-09-08: 10 mg via INTRAVENOUS

## 2020-09-08 MED ORDER — CEFAZOLIN SODIUM-DEXTROSE 2-4 GM/100ML-% IV SOLN
INTRAVENOUS | Status: AC
  Filled 2020-09-08: qty 100

## 2020-09-08 MED ORDER — ONDANSETRON HCL 4 MG/2ML IJ SOLN
INTRAMUSCULAR | Status: AC
  Filled 2020-09-08: qty 2

## 2020-09-08 MED ORDER — HEPARIN SOD (PORK) LOCK FLUSH 100 UNIT/ML IV SOLN
INTRAVENOUS | Status: DC | PRN
  Administered 2020-09-08: 750 [IU]

## 2020-09-08 MED ORDER — HEPARIN (PORCINE) IN NACL 2-0.9 UNITS/ML
INTRAMUSCULAR | Status: AC | PRN
  Administered 2020-09-08: 20 mL via INTRAVENOUS

## 2020-09-08 MED ORDER — EPHEDRINE SULFATE 50 MG/ML IJ SOLN
INTRAMUSCULAR | Status: DC | PRN
  Administered 2020-09-08 (×2): 10 mg via INTRAVENOUS

## 2020-09-08 MED ORDER — TRAMADOL HCL 50 MG PO TABS
50.0000 mg | ORAL_TABLET | Freq: Four times a day (QID) | ORAL | 0 refills | Status: AC | PRN
Start: 2020-09-08 — End: 2020-09-11

## 2020-09-08 SURGICAL SUPPLY — 70 items
ADH SKN CLS APL DERMABOND .7 (GAUZE/BANDAGES/DRESSINGS) ×1
APL PRP STRL LF DISP 70% ISPRP (MISCELLANEOUS) ×1
APL SKNCLS STERI-STRIP NONHPOA (GAUZE/BANDAGES/DRESSINGS)
BAG DECANTER FOR FLEXI CONT (MISCELLANEOUS) ×3 IMPLANT
BENZOIN TINCTURE PRP APPL 2/3 (GAUZE/BANDAGES/DRESSINGS) IMPLANT
BLADE HEX COATED 2.75 (ELECTRODE) ×3 IMPLANT
BLADE SURG 11 STRL SS (BLADE) ×3 IMPLANT
BLADE SURG 15 STRL LF DISP TIS (BLADE) ×1 IMPLANT
BLADE SURG 15 STRL SS (BLADE) ×3
CANISTER SUCT 1200ML W/VALVE (MISCELLANEOUS) IMPLANT
CHLORAPREP W/TINT 26 (MISCELLANEOUS) ×3 IMPLANT
CLOSURE WOUND 1/2 X4 (GAUZE/BANDAGES/DRESSINGS)
COVER BACK TABLE 60X90IN (DRAPES) ×3 IMPLANT
COVER MAYO STAND STRL (DRAPES) ×3 IMPLANT
COVER PROBE 5X48 (MISCELLANEOUS) ×3
COVER WAND RF STERILE (DRAPES) IMPLANT
DECANTER SPIKE VIAL GLASS SM (MISCELLANEOUS) IMPLANT
DERMABOND ADVANCED (GAUZE/BANDAGES/DRESSINGS) ×2
DERMABOND ADVANCED .7 DNX12 (GAUZE/BANDAGES/DRESSINGS) ×1 IMPLANT
DRAPE C-ARM 42X72 X-RAY (DRAPES) ×3 IMPLANT
DRAPE LAPAROSCOPIC ABDOMINAL (DRAPES) ×3 IMPLANT
DRAPE UTILITY XL STRL (DRAPES) ×3 IMPLANT
DRSG TEGADERM 2-3/8X2-3/4 SM (GAUZE/BANDAGES/DRESSINGS) IMPLANT
DRSG TEGADERM 4X4.75 (GAUZE/BANDAGES/DRESSINGS) IMPLANT
ELECT REM PT RETURN 9FT ADLT (ELECTROSURGICAL) ×3
ELECTRODE REM PT RTRN 9FT ADLT (ELECTROSURGICAL) ×1 IMPLANT
GAUZE SPONGE 4X4 12PLY STRL LF (GAUZE/BANDAGES/DRESSINGS) IMPLANT
GLOVE BIO SURGEON STRL SZ 6 (GLOVE) ×2 IMPLANT
GLOVE BIOGEL PI IND STRL 6 (GLOVE) IMPLANT
GLOVE BIOGEL PI IND STRL 6.5 (GLOVE) IMPLANT
GLOVE BIOGEL PI IND STRL 8 (GLOVE) ×1 IMPLANT
GLOVE BIOGEL PI INDICATOR 6 (GLOVE) ×2
GLOVE BIOGEL PI INDICATOR 6.5 (GLOVE) ×4
GLOVE BIOGEL PI INDICATOR 8 (GLOVE)
GLOVE ECLIPSE 8.0 STRL XLNG CF (GLOVE) ×1 IMPLANT
GLOVE SS PI  5.5 STRL (GLOVE) ×3
GLOVE SS PI 5.5 STRL (GLOVE) IMPLANT
GOWN L4 XXLG W/PAP TWL (GOWN DISPOSABLE) ×2 IMPLANT
GOWN STRL REUS W/ TWL LRG LVL3 (GOWN DISPOSABLE) ×2 IMPLANT
GOWN STRL REUS W/TWL LRG LVL3 (GOWN DISPOSABLE) ×3
GOWN STRL REUS W/TWL XL LVL3 (GOWN DISPOSABLE) ×4 IMPLANT
IV KIT MINILOC 20X1 SAFETY (NEEDLE) IMPLANT
KIT CVR 48X5XPRB PLUP LF (MISCELLANEOUS) ×1 IMPLANT
KIT PORT POWER 8FR ISP CVUE (Port) ×2 IMPLANT
NDL HYPO 25X1 1.5 SAFETY (NEEDLE) ×1 IMPLANT
NDL SAFETY ECLIPSE 18X1.5 (NEEDLE) IMPLANT
NDL SPNL 22GX3.5 QUINCKE BK (NEEDLE) IMPLANT
NEEDLE HYPO 18GX1.5 SHARP (NEEDLE)
NEEDLE HYPO 22GX1.5 SAFETY (NEEDLE) IMPLANT
NEEDLE HYPO 25X1 1.5 SAFETY (NEEDLE) ×3 IMPLANT
NEEDLE SPNL 22GX3.5 QUINCKE BK (NEEDLE) IMPLANT
PACK BASIN DAY SURGERY FS (CUSTOM PROCEDURE TRAY) ×3 IMPLANT
PENCIL SMOKE EVACUATOR (MISCELLANEOUS) ×3 IMPLANT
SET SHEATH INTRODUCER 10FR (MISCELLANEOUS) IMPLANT
SHEATH COOK PEEL AWAY SET 9F (SHEATH) IMPLANT
SLEEVE SCD COMPRESS KNEE MED (MISCELLANEOUS) ×3 IMPLANT
SPONGE LAP 4X18 RFD (DISPOSABLE) IMPLANT
STRIP CLOSURE SKIN 1/2X4 (GAUZE/BANDAGES/DRESSINGS) IMPLANT
SUT MON AB 4-0 PC3 18 (SUTURE) ×3 IMPLANT
SUT PROLENE 2 0 CT2 30 (SUTURE) IMPLANT
SUT PROLENE 2 0 SH DA (SUTURE) ×3 IMPLANT
SUT SILK 2 0 TIES 17X18 (SUTURE)
SUT SILK 2-0 18XBRD TIE BLK (SUTURE) IMPLANT
SUT VICRYL 3-0 CR8 SH (SUTURE) ×3 IMPLANT
SYR 5ML LUER SLIP (SYRINGE) ×3 IMPLANT
SYR CONTROL 10ML LL (SYRINGE) ×3 IMPLANT
TOWEL GREEN STERILE FF (TOWEL DISPOSABLE) ×6 IMPLANT
TUBE CONNECTING 20'X1/4 (TUBING)
TUBE CONNECTING 20X1/4 (TUBING) IMPLANT
YANKAUER SUCT BULB TIP NO VENT (SUCTIONS) IMPLANT

## 2020-09-08 NOTE — Transfer of Care (Signed)
Immediate Anesthesia Transfer of Care Note  Patient: Jacqueline Tyler  Procedure(s) Performed: INSERTION PORT-A-CATH (Right Chest)  Patient Location: PACU  Anesthesia Type:General  Level of Consciousness: drowsy, patient cooperative and responds to stimulation  Airway & Oxygen Therapy: Patient Spontanous Breathing and Patient connected to face mask oxygen  Post-op Assessment: Report given to RN and Post -op Vital signs reviewed and stable  Post vital signs: Reviewed and stable  Last Vitals:  Vitals Value Taken Time  BP 118/96 09/08/20 1441  Temp    Pulse 84 09/08/20 1441  Resp    SpO2 100 % 09/08/20 1441  Vitals shown include unvalidated device data.  Last Pain:  Vitals:   09/08/20 1225  TempSrc: Oral  PainSc: 0-No pain      Patients Stated Pain Goal: 7 (21/22/48 2500)  Complications: No complications documented.

## 2020-09-08 NOTE — H&P (Signed)
Jacqueline Tyler is an 59 y.o. female.    HPI: Ms. Texidor is a 59 yo female with a newly-diagnosed head of pancreas adenocarcinoma. She presented a few weeks ago with obstructive jaundice and underwent EUS/ERCP with biliary stent placement. FNA confirmed the diagnosis of adenocarcinoma. Her tumor involves the SMV and she will be undergoing upfront chemotherapy with Dr. Burr Medico. She presents today for port placement. She has not had any recent fevers or illnesses. COVID test was negative on 10/19.  Past Medical History:  Diagnosis Date  . Anxiety   . Cancer ([place]) 07/2020   pancreatic cancer  . Depression   . Diabetes mellitus without complication ([place]) 67/3419   pancreatic cancer  . High cholesterol   . Hypertension     Past Surgical History:  Procedure Laterality Date  . BILIARY BRUSHING  08/22/2020   Procedure: BILIARY BRUSHING;  Surgeon: Clarene Essex, MD;  Location: [facility];  Service: Endoscopy;;  . BILIARY STENT PLACEMENT N/A 08/22/2020   Procedure: BILIARY STENT PLACEMENT;  Surgeon: Clarene Essex, MD;  Location: [facility];  Service: Endoscopy;  Laterality: N/A;  . ENDOSCOPIC RETROGRADE CHOLANGIOPANCREATOGRAPHY (ERCP) WITH PROPOFOL N/A 08/22/2020   Procedure: ENDOSCOPIC RETROGRADE CHOLANGIOPANCREATOGRAPHY (ERCP) WITH PROPOFOL;  Surgeon: Clarene Essex, MD;  Location: [facility];  Service: Endoscopy;  Laterality: N/A;  . ESOPHAGOGASTRODUODENOSCOPY (EGD) WITH PROPOFOL N/A 08/24/2020   Procedure: ESOPHAGOGASTRODUODENOSCOPY (EGD) WITH PROPOFOL;  Surgeon: Arta Silence, MD;  Location: [facility];  Service: Endoscopy;  Laterality: N/A;  . FINE NEEDLE ASPIRATION N/A 08/24/2020   Procedure: FINE NEEDLE ASPIRATION (FNA) LINEAR;  Surgeon: Arta Silence, MD;  Location: [facility];  Service: Endoscopy;  Laterality: N/A;  . SPHINCTEROTOMY  08/22/2020   Procedure: SPHINCTEROTOMY;  Surgeon: Clarene Essex, MD;  Location: [facility];  Service: Endoscopy;;  . UPPER ESOPHAGEAL ENDOSCOPIC ULTRASOUND (EUS)  N/A 08/24/2020   Procedure: UPPER ESOPHAGEAL ENDOSCOPIC ULTRASOUND (EUS);  Surgeon: Arta Silence, MD;  Location: [facility];  Service: Endoscopy;  Laterality: N/A;    Family History  Problem Relation Age of Onset  . CAD Mother    Social History:  reports that she has never smoked. She has never used smokeless tobacco. She reports that she does not drink alcohol and does not use drugs.  Allergies:  Allergies  Allergen Reactions  . Oxycodone-Acetaminophen Nausea Only    Patient was unsure / doesn't recall taking   . Poison Ivy Extract Hives    Patient states HIGHLY allergice  . Tyloxapol     Nausea     Medications Prior to Admission  Medication Sig Dispense Refill  . acetaminophen (TYLENOL) 325 MG tablet Take 650 mg by mouth every 6 (six) hours as needed.    . citalopram (CELEXA) 40 MG tablet Take 10 mg by mouth daily.     . cloNIDine (CATAPRES) 0.1 MG tablet Take 0.1 mg by mouth at bedtime.    . hydrochlorothiazide (HYDRODIURIL) 12.5 MG tablet Take 12.5 mg by mouth daily.    Marland Kitchen omeprazole (PRILOSEC) 20 MG capsule Take 20 mg by mouth daily.    . simvastatin (ZOCOR) 80 MG tablet Take 80 mg by mouth daily.    Marland Kitchen lidocaine-prilocaine (EMLA) cream Apply to affected area once 30 g 3  . ondansetron (ZOFRAN) 8 MG tablet Take 1 tablet (8 mg total) by mouth 2 (two) times daily as needed. Start on day 3 after chemotherapy. 30 tablet 1  . prochlorperazine (COMPAZINE) 10 MG tablet Take 1 tablet (10 mg total) by mouth every 6 (six) hours  as needed (Nausea or vomiting). 30 tablet 1    No results found for this or any previous visit (from the past 48 hour(s)). No results found.  Review of Systems  Constitutional: Negative for appetite change, chills, fever and unexpected weight change.  HENT: Negative for congestion and facial swelling.   Respiratory: Negative for cough and shortness of breath.   Cardiovascular: Negative for chest pain.  Gastrointestinal: Negative for abdominal pain,  nausea and vomiting.  Musculoskeletal: Negative for arthralgias.  Skin: Negative for rash and wound.  Allergic/Immunologic: Negative for immunocompromised state.  Neurological: Negative for facial asymmetry and speech difficulty.  Psychiatric/Behavioral: Negative for agitation and confusion.    Blood pressure 107/60, pulse 89, temperature 98.3 F (36.8 C), temperature source Oral, resp. rate 20, height 5\' 2"  (1.575 m), weight 70.3 kg, SpO2 99 %. Physical Exam Constitutional:      General: She is not in acute distress. HENT:     Head: Normocephalic and atraumatic.     Nose: Nose normal.  Eyes:     General: No scleral icterus.    Conjunctiva/sclera: Conjunctivae normal.  Pulmonary:     Effort: Pulmonary effort is normal. No respiratory distress.  Musculoskeletal:        General: Normal range of motion.  Skin:    General: Skin is warm and dry.     Coloration: Skin is not jaundiced.  Neurological:     General: No focal deficit present.     Mental Status: She is alert.  Psychiatric:        Mood and Affect: Mood normal.        Behavior: Behavior normal.      Assessment/Plan 59 yo female with newly-diagnosed pancreatic adenocarcinoma. Proceed to OR for portacath insertion. CXR in PACU, plan for discharge home from PACU. All questions answered, informed consent completed.  Dwan Bolt, MD 09/08/2020, 1:02 PM

## 2020-09-08 NOTE — Anesthesia Preprocedure Evaluation (Addendum)
Anesthesia Evaluation  Patient identified by MRN, date of birth, ID band Patient awake    Reviewed: Allergy & Precautions, NPO status , Patient's Chart, lab work & pertinent test results  Airway Mallampati: II  TM Distance: >3 FB Neck ROM: Full    Dental  (+) Dental Advisory Given, Teeth Intact   Pulmonary neg pulmonary ROS,    Pulmonary exam normal breath sounds clear to auscultation       Cardiovascular hypertension, Pt. on medications Normal cardiovascular exam Rhythm:Regular Rate:Normal     Neuro/Psych PSYCHIATRIC DISORDERS Anxiety Depression negative neurological ROS     GI/Hepatic negative GI ROS, Neg liver ROS,   Endo/Other  diabetes  Renal/GU negative Renal ROS     Musculoskeletal negative musculoskeletal ROS (+)   Abdominal (+) - obese,   Peds  Hematology negative hematology ROS (+)   Anesthesia Other Findings   Reproductive/Obstetrics                            Anesthesia Physical Anesthesia Plan  ASA: II  Anesthesia Plan: General   Post-op Pain Management:    Induction: Intravenous  PONV Risk Score and Plan: 3 and Ondansetron, Dexamethasone, Midazolam and Treatment may vary due to age or medical condition  Airway Management Planned: LMA  Additional Equipment:   Intra-op Plan:   Post-operative Plan: Extubation in OR  Informed Consent: I have reviewed the patients History and Physical, chart, labs and discussed the procedure including the risks, benefits and alternatives for the proposed anesthesia with the patient or authorized representative who has indicated his/her understanding and acceptance.     Dental advisory given  Plan Discussed with: CRNA  Anesthesia Plan Comments:        Anesthesia Quick Evaluation

## 2020-09-08 NOTE — Discharge Instructions (Signed)
SURGERY DISCHARGE INSTRUCTIONS: PORT-A-CATH PLACEMENT  Activity . You may resume your usual activities as tolerated . Ok to shower in 48 hours, but do not bathe or submerge incisions underwater. . Do not drive while taking narcotic pain medication.  Wound Care . Your incision is covered with skin glue called Dermabond. This will peel off on its own over time. . You may shower and allow warm soapy water to run over your incisions. Gently pat dry. . Do not submerge your incision underwater. . Monitor your incision for any new redness, tenderness, or drainage. . You may start using your port in 48 hours. Do not apply EMLA cream directly over the Dermabond (skin glue).  When to Call us: Marland Kitchen Fever greater than 100.5 . New redness, drainage, or swelling at incision site . Severe pain, nausea, or vomiting . Shortness of breath, difficulty breathing  For questions or concerns, please call the office at [phone].    Post Anesthesia Home Care Instructions  Activity: Get plenty of rest for the remainder of the day. A responsible individual must stay with you for 24 hours following the procedure.  For the next 24 hours, DO NOT: -Drive a car -Paediatric nurse -Drink alcoholic beverages -Take any medication unless instructed by your physician -Make any legal decisions or sign important papers.  Meals: Start with liquid foods such as gelatin or soup. Progress to regular foods as tolerated. Avoid greasy, spicy, heavy foods. If nausea and/or vomiting occur, drink only clear liquids until the nausea and/or vomiting subsides. Call your physician if vomiting continues.  Special Instructions/Symptoms: Your throat may feel dry or sore from the anesthesia or the breathing tube placed in your throat during surgery. If this causes discomfort, gargle with warm salt water. The discomfort should disappear within 24 hours.  If you had a scopolamine patch placed behind your ear for the management of  post- operative nausea and/or vomiting:  1. The medication in the patch is effective for 72 hours, after which it should be removed.  Wrap patch in a tissue and discard in the trash. Wash hands thoroughly with soap and water. 2. You may remove the patch earlier than 72 hours if you experience unpleasant side effects which may include dry mouth, dizziness or visual disturbances. 3. Avoid touching the patch. Wash your hands with soap and water after contact with the patch.

## 2020-09-08 NOTE — Anesthesia Procedure Notes (Signed)
Procedure Name: LMA Insertion Date/Time: 09/08/2020 1:32 PM Performed by: Glory Buff, CRNA Pre-anesthesia Checklist: Patient identified, Emergency Drugs available, Suction available and Patient being monitored Patient Re-evaluated:Patient Re-evaluated prior to induction Oxygen Delivery Method: Circle system utilized Preoxygenation: Pre-oxygenation with 100% oxygen Induction Type: IV induction LMA: LMA inserted LMA Size: 4.0 Number of attempts: 1 Placement Confirmation: positive ETCO2 Tube secured with: Tape Dental Injury: Teeth and Oropharynx as per pre-operative assessment

## 2020-09-08 NOTE — Op Note (Signed)
Date: 09/08/20  Patient: Jacqueline Tyler MRN: [id number]  Preoperative Diagnosis: Pancreatic adenocarcinoma Postoperative Diagnosis: Same  Procedure: Right subclavian port-a-cath insertion  Surgeon: Michaelle Birks, MD  EBL: Minimal  Anesthesia: General  Specimens: None  Indications: [name] is a 59 yo female with a newly-diagnosed locally advanced head of pancreas cancer. She is to undergo upfront chemotherapy and requires port placement.  Findings: 8-French Power Port placed in the right subclavian vein. Total catheter length of 15cm. No pneumothorax on postoperative chest XR.  Procedure details: Informed consent was obtained in the preoperative area prior to the procedure. The patient was brought to the operating room and placed on the table in the supine position. General anesthesia was induced and appropriate lines and drains were placed for intraoperative monitoring. Perioperative antibiotics were administered per SCIP guidelines. The upper chest and neck were prepped and draped in the usual sterile fashion. A pre-procedure timeout was taken verifying patient identity, surgical site and procedure to be performed.  The patient was placed in Trendelenberg and the right subclavian vein was accessed with a large-bore needle. A guidewire was inserted and advanced, and position in the SVC was confirmed fluoroscopically. The needle was removed and the wire was clipped to the drapes to secure its position. Next a place for the port was chosen on the upper lateral right chest and the skin was infiltrated with 0.25% bupivicaine. A small skin incision was made and a subcutaneous pocket was created with cautery. The port and catheter were then flushed and brought onto the field. Three 3-0 prolene sutures were used to secure the port in the subcutaneous pocket, but the sutures were not tied down. The port was placed in the pocket and the attached cathether was tunneled beneath the skin to the wire exit  site. The catheter was then measured using fluoro - it was placed over the skin adjacent to the guidewire, and marked externally at the cavoatrial junction. The catheter was then cut at this location, which was at 15cm. The dilator and sheath were then advanced over the guidewire, and the wire and sheath were removed. The end of the catheter was inserted through the sheath and advanced, and the sheath was peeled away. The port was then accessed with a Huber needle, and blood was aspirated and the port was flushed with heparinized saline. A final fluoroscopic image confirmed appropriate position of the catheter tip within the SVC, without kinking of the catheter. The prolene sutures were tied down. The port was again flushed with heparinized saline. The skin was closed with a deep layer of interrupted 3-0 Vicryl suture, followed by a running subcuticular 4-0 monocryl suture. Dermabond was applied.  All counts were correct x2 at the end of the procedure. The patient was extubated and taken to PACU in stable condition.  Michaelle Birks, MD 09/08/20 2:48 PM

## 2020-09-08 NOTE — Anesthesia Postprocedure Evaluation (Signed)
Anesthesia Post Note  Patient: Jacqueline Tyler  Procedure(s) Performed: INSERTION PORT-A-CATH (Right Chest)     Patient location during evaluation: PACU Anesthesia Type: General Level of consciousness: sedated and patient cooperative Pain management: pain level controlled Vital Signs Assessment: post-procedure vital signs reviewed and stable Respiratory status: spontaneous breathing Cardiovascular status: stable Anesthetic complications: no   No complications documented.  Last Vitals:  Vitals:   09/08/20 1445 09/08/20 1500  BP: 128/61 126/64  Pulse: 76 71  Resp: 17 13  Temp: 36.6 C   SpO2: 100% 100%    Last Pain:  Vitals:   09/08/20 1445  TempSrc:   PainSc: 0-No pain                 Nolon Nations

## 2020-09-09 ENCOUNTER — Encounter (HOSPITAL_BASED_OUTPATIENT_CLINIC_OR_DEPARTMENT_OTHER): Payer: Self-pay | Admitting: Surgery

## 2020-09-09 NOTE — Progress Notes (Signed)
Jacqueline Tyler   [phone] [phone]) [phone]   Clinic Follow up Note   Patient Care Team: Nicola Girt, DO as PCP - General (Internal Medicine) Jonnie Finner, RN as Oncology Nurse Navigator Truitt Merle, MD as Consulting Physician (Oncology) Dwan Bolt, MD as Consulting Physician (General Surgery)  Date of Service:  09/12/2020  CHIEF COMPLAINT: F/u of pancreatic cancer  SUMMARY OF ONCOLOGIC HISTORY: Oncology History Overview Note  Cancer Staging Pancreatic cancer [facility]) Staging form: Exocrine Pancreas, AJCC 8th Edition - Clinical stage from 08/24/2020: Stage IIB (cT2, cN1, cM0) - Signed by Truitt Merle, MD on 08/30/2020    Pancreatic cancer (Jacqueline Tyler)  08/20/2020 Imaging   CT AP 08/20/20  IMPRESSION: 1. 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 Collateral circulation is noted. There is moderate intrahepatic and extrahepatic biliary dilatation which appears to be due to the pancreatic head mass. Pancreatic ductal dilatation is noted as well. 2. Probable 2.7 cm uterine fibroid. 3. Aortic atherosclerosis.   Aortic Atherosclerosis (ICD10-I70.0).   08/20/2020 Tumor Marker   Ca19-9 - 927   08/22/2020 Procedure   ERCP by Dr Watt Climes 08/22/20  IMPRESSION - The major papilla appeared normal. - A biliary sphincterotomy was performed. - Cells for cytology obtained in the lower third and middle of the main duct. - One plastic stent was placed into the common bile duct.   FINAL MICROSCOPIC DIAGNOSIS:  - No malignant cells identified  - Benign reactive/reparative changes   08/24/2020 Cancer Staging   Staging form: Exocrine Pancreas, AJCC 8th Edition - Clinical stage from 08/24/2020: Stage IIB (cT2, cN1, cM0) - Signed by Truitt Merle, MD on 08/30/2020   08/24/2020 Procedure    EUS by Dr Paulita Fujita 08/24/20  IMPRESSION - There was no sign of significant pathology in the ampulla. - A few malignant-appearing lymph nodes were visualized in the peripancreatic region and porta hepatis region. - One stent was visualized endosonographically in the common bile duct. - A mass was identified in the pancreatic head. Tissue was obtained from this exam. The preliminary diagnosis is consistent with adenocarcinoma. Invasion into SMV/PV seen. Lymphadenopathy noted. This was staged T3 N1 Mx by endosonographic criteria. Fine needle aspiration performed.   08/24/2020 Initial Biopsy   FINAL MICROSCOPIC DIAGNOSIS: 08/24/20 - Malignant cells consistent with adenocarcinoma    08/30/2020 Initial Diagnosis   Pancreatic cancer ([place])   09/05/2020 Imaging   CT Chest  IMPRESSION: 1. Stable 2.7 cm infiltrating pancreatic head mass with borderline enlarged peripancreatic lymph nodes. 2. No findings for pulmonary metastatic disease. 3. Small hiatal hernia. 4. Aortic atherosclerosis.   Aortic Atherosclerosis (ICD10-I70.0).   09/08/2020 Procedure   INSERTION PORT-A-CATH by Dr Zenia Resides and Barry Dienes    09/12/2020 -  Chemotherapy   Neoadjuvant FOLFIRINOX q2weeks starting 09/12/20       CURRENT THERAPY:  Neoadjuvant FOLFIRINOX q2 weeks starting 09/12/20  INTERVAL HISTORY:  Jacqueline Tyler is here for a follow up. She presents to the clinic alone. She had port placed last week and tolerated procedure well.  She is doing well, no complains, eats good.   All other systems were reviewed with the patient and are negative.  MEDICAL HISTORY:  Past Medical History:  Diagnosis Date  . Anxiety   . Cancer ([place]) 07/2020   pancreatic cancer  . Depression   .  Diabetes mellitus without complication (Jacqueline Tyler) 80/8811   pancreatic cancer  . High cholesterol   . Hypertension     SURGICAL HISTORY: Past Surgical History:  Procedure Laterality Date  . BILIARY BRUSHING  08/22/2020   Procedure: BILIARY  BRUSHING;  Surgeon: Clarene Essex, MD;  Location: [facility];  Service: Endoscopy;;  . BILIARY STENT PLACEMENT N/A 08/22/2020   Procedure: BILIARY STENT PLACEMENT;  Surgeon: Clarene Essex, MD;  Location: [facility];  Service: Endoscopy;  Laterality: N/A;  . ENDOSCOPIC RETROGRADE CHOLANGIOPANCREATOGRAPHY (ERCP) WITH PROPOFOL N/A 08/22/2020   Procedure: ENDOSCOPIC RETROGRADE CHOLANGIOPANCREATOGRAPHY (ERCP) WITH PROPOFOL;  Surgeon: Clarene Essex, MD;  Location: [facility];  Service: Endoscopy;  Laterality: N/A;  . ESOPHAGOGASTRODUODENOSCOPY (EGD) WITH PROPOFOL N/A 08/24/2020   Procedure: ESOPHAGOGASTRODUODENOSCOPY (EGD) WITH PROPOFOL;  Surgeon: Arta Silence, MD;  Location: [facility];  Service: Endoscopy;  Laterality: N/A;  . FINE NEEDLE ASPIRATION N/A 08/24/2020   Procedure: FINE NEEDLE ASPIRATION (FNA) LINEAR;  Surgeon: Arta Silence, MD;  Location: [facility];  Service: Endoscopy;  Laterality: N/A;  . PORTACATH PLACEMENT Right 09/08/2020   Procedure: INSERTION PORT-A-CATH;  Surgeon: Dwan Bolt, MD;  Location: [facility];  Service: General;  Laterality: Right;  . SPHINCTEROTOMY  08/22/2020   Procedure: SPHINCTEROTOMY;  Surgeon: Clarene Essex, MD;  Location: [facility];  Service: Endoscopy;;  . UPPER ESOPHAGEAL ENDOSCOPIC ULTRASOUND (EUS) N/A 08/24/2020   Procedure: UPPER ESOPHAGEAL ENDOSCOPIC ULTRASOUND (EUS);  Surgeon: Arta Silence, MD;  Location: [facility];  Service: Endoscopy;  Laterality: N/A;    I have reviewed the social history and family history with the patient and they are unchanged from previous note.  ALLERGIES:  is allergic to oxycodone-acetaminophen, poison ivy extract, and tyloxapol.  MEDICATIONS:  Current Outpatient Medications  Medication Sig Dispense Refill  . acetaminophen (TYLENOL) 325 MG tablet Take 650 mg by mouth every 6 (six) hours as needed.    . citalopram (CELEXA) 40 MG tablet Take 10 mg by mouth daily.     . cloNIDine (CATAPRES) 0.1 MG  tablet Take 0.1 mg by mouth at bedtime.    . hydrochlorothiazide (HYDRODIURIL) 12.5 MG tablet Take 12.5 mg by mouth daily.    Marland Kitchen lidocaine-prilocaine (EMLA) cream Apply to affected area once 30 g 3  . omeprazole (PRILOSEC) 20 MG capsule Take 20 mg by mouth daily.    . ondansetron (ZOFRAN) 8 MG tablet Take 1 tablet (8 mg total) by mouth 2 (two) times daily as needed. Start on day 3 after chemotherapy. 30 tablet 1  . prochlorperazine (COMPAZINE) 10 MG tablet Take 1 tablet (10 mg total) by mouth every 6 (six) hours as needed (Nausea or vomiting). 30 tablet 1  . simvastatin (ZOCOR) 80 MG tablet Take 80 mg by mouth daily.     No current facility-administered medications for this visit.   Facility-Administered Medications Ordered in Other Visits  Medication Dose Route Frequency Provider Last Rate Last Admin  . famotidine (PEPCID) IVPB 20 mg premix  20 mg Intravenous Once Harle Stanford., PA-C      . fluorouracil (ADRUCIL) 4,200 mg in sodium chloride 0.9 % 66 mL chemo infusion  2,400 mg/m2 (Treatment Plan Recorded) Intravenous 1 day or 1 dose Truitt Merle, MD      . heparin lock flush 100 unit/mL  500 Units Intracatheter Once PRN Truitt Merle, MD      . OLANZapine (ZYPREXA) tablet 5 mg  5 mg Oral Once Sandi Mealy E., PA-C      . sodium chloride flush (NS)  0.9 % injection 10 mL  10 mL Intracatheter PRN Truitt Merle, MD        PHYSICAL EXAMINATION: ECOG PERFORMANCE STATUS: 0 - Asymptomatic  Vitals:   09/12/20 0818  BP: 122/72  Pulse: 61  Resp: 17  Temp: 98.2 F (36.8 C)  SpO2: 100%   Filed Weights   09/12/20 0818  Weight: 154 lb 6.4 oz (70 kg)    GENERAL:alert, no distress and comfortable SKIN: skin color, texture, turgor are normal, no rashes or significant lesions, port site is clean  EYES: normal, Conjunctiva are pink and non-injected, sclera clear NECK: supple, thyroid normal size, non-tender, without nodularity LYMPH:  no palpable lymphadenopathy in the cervical, axillary  LUNGS: clear to  auscultation and percussion with normal breathing effort HEART: regular rate & rhythm and no murmurs and no lower extremity edema ABDOMEN:abdomen soft, non-tender and normal bowel sounds Musculoskeletal:no cyanosis of digits and no clubbing  NEURO: alert & oriented x 3 with fluent speech, no focal motor/sensory deficits  LABORATORY DATA:  I have reviewed the data as listed CBC Latest Ref Rng & Units 09/12/2020 08/24/2020 08/23/2020  WBC 4.0 - 10.5 K/uL 9.1 10.7(H) 12.1(H)  Hemoglobin 12.0 - 15.0 g/dL 11.7(L) 10.1(L) 11.8(L)  Hematocrit 36 - 46 % 36.0 32.2(L) 36.1  Platelets 150 - 400 K/uL 315 326 386     CMP Latest Ref Rng & Units 09/12/2020 08/24/2020 08/23/2020  Glucose 70 - 99 mg/dL 186(H) 127(H) 149(H)  BUN 6 - 20 mg/dL _0 Creatinine 0.44 - 1.00 mg/dL 0.87 0.83 0.81  Sodium 135 - 145 mmol/L 137 139 137  Potassium 3.5 - 5.1 mmol/L 3.1(L) 3.7 3.9  Chloride 98 - 111 mmol/L 103 109 103  CO2 22 - 32 mmol/L _1 Calcium 8.9 - 10.3 mg/dL 9.1 8.0(L) 9.1  Total Protein 6.5 - 8.1 g/dL 6.8 5.9(L) 7.3  Total Bilirubin 0.3 - 1.2 mg/dL 1.1 2.6(H) 4.7(H)  Alkaline Phos 38 - 126 U/L 242(H) 610(H) 885(H)  AST 15 - 41 U/L 17 64(H) 208(H)  ALT 0 - 44 U/L 32 206(H) 369(H)      RADIOGRAPHIC STUDIES: I have personally reviewed the radiological images as listed and agreed with the findings in the report. No results found.   ASSESSMENT & PLAN:  IDAMAE COCCIA is a 59 y.o. female with   1. 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 She had CBD stent placed with ERCP on 08/22/20 to resolve her jaundice.  -Her 08/24/20 EUS with Dr Paulita Fujita showed her mass at head of pancreas with SMV and PV invasion, biopsy confirmed adenocarcinoma with few malignant-appearing LNs in the peripancreatic and porta  hepatis region. Given the vascular invasion, this is a borderline resectable. CT Chest negative for malignancy.  -she was seen by surgeon Dr Zenia Resides on 09/06/20, neoadjvuant chemo recommended  -Due to the borderline resectable disease, I recommend neoadjuvant chemotherapy with FOLFIRINOX q2weeks to downstage her borderline resectable disease for 3-4 months before surgery with possible additional adjuvant chemo after surgery. Will scan her after 3 months treatment to monitor her response. Plan to start today (09/12/20)  -I reviewed the benefit and side effect of chemo with patient, especially precautions for neutropenic fever, cold sensitivity, diet and exercise etc.  Chemo consent obtained today -Lab reviewed, adequate for treatment, will proceed for cycle FOLFIRINOX today, with  GCSF on day 3 at home  -f/u in one week for toxicity checkup   2. Obstructive Jaundice, Transaminitis, Hyperbilirubinuria  -Secondary to #1  -Her initial 08/20/20 labs showed AST 349, ALT 473, alk phos 903 and tbili 7.3. She only had Jaundice of skin and eyes. Otherwise asymptomatic.  -Plastic Stent placed by ERCP on 08/22/20 with Dr Watt Climes. May need stent exchange in the future.  -Jaundice resolved, liver function improved.   3. HTN, Hyperglycemia  -During recent hospitalization for pancreatic cancer her 08/20/20 A1c was 7.1 She may require medication down the road given her pancreatic cancer.  -On Zocor, Clonidine and HCTZ for HTN. Continue to F/u with PCP  4. Financial assistance and Social support  -She notes concern about continuing work when she can for money. She works for [employer] from home. Her husband recently retired but may return to work in near future.  -I encouraged her to consult with financial advocate or our financial office for assistance.  -She is on Celexa 27m for her depression. Mood stable and given her strong CPanamafaith she notes she has accepted her cancer diagnosis.  -I  previously discussed SW and other resources available to her through [facility]    PLAN:  -Lab reviewed, adequate for treatment, will proceed with first cycle FOLFIRINOX today -Pump DC on day 3.  Patient will give G-CSF at home on day 3 -Lab and follow-up in 1 week for toxicity checkup -Echo in 2 weeks  No problem-specific Assessment & Plan notes found for this encounter.   No orders of the defined types were placed in this encounter.  All questions were answered. The patient knows to call the clinic with any problems, questions or concerns. No barriers to learning was detected. The total time spent in the appointment was 30 minutes.     YTruitt Merle MD 09/12/2020   I, AJoslyn Devon am acting as scribe for YTruitt Merle MD.   I have reviewed the above documentation for accuracy and completeness, and I agree with the above.

## 2020-09-12 ENCOUNTER — Ambulatory Visit (HOSPITAL_BASED_OUTPATIENT_CLINIC_OR_DEPARTMENT_OTHER): Payer: 59 | Admitting: Medical

## 2020-09-12 ENCOUNTER — Inpatient Hospital Stay (HOSPITAL_BASED_OUTPATIENT_CLINIC_OR_DEPARTMENT_OTHER): Payer: 59 | Admitting: Hematology

## 2020-09-12 ENCOUNTER — Inpatient Hospital Stay: Payer: 59

## 2020-09-12 ENCOUNTER — Encounter: Payer: Self-pay | Admitting: Hematology

## 2020-09-12 ENCOUNTER — Encounter: Payer: Self-pay | Admitting: Radiology

## 2020-09-12 ENCOUNTER — Other Ambulatory Visit: Payer: Self-pay

## 2020-09-12 VITALS — BP 122/72 | HR 61 | Temp 98.2°F | Resp 17 | Ht 62.0 in | Wt 154.4 lb

## 2020-09-12 VITALS — BP 157/85 | HR 75 | Temp 97.7°F | Resp 17

## 2020-09-12 DIAGNOSIS — C250 Malignant neoplasm of head of pancreas: Secondary | ICD-10-CM

## 2020-09-12 DIAGNOSIS — I10 Essential (primary) hypertension: Secondary | ICD-10-CM

## 2020-09-12 DIAGNOSIS — R110 Nausea: Secondary | ICD-10-CM

## 2020-09-12 DIAGNOSIS — C259 Malignant neoplasm of pancreas, unspecified: Secondary | ICD-10-CM

## 2020-09-12 DIAGNOSIS — Z006 Encounter for examination for normal comparison and control in clinical research program: Secondary | ICD-10-CM

## 2020-09-12 LAB — CMP (CANCER CENTER ONLY)
ALT: 32 U/L (ref 0–44)
AST: 17 U/L (ref 15–41)
Albumin: 3.5 g/dL (ref 3.5–5.0)
Alkaline Phosphatase: 242 U/L — ABNORMAL HIGH (ref 38–126)
Anion gap: 7 (ref 5–15)
BUN: 12 mg/dL (ref 6–20)
CO2: 27 mmol/L (ref 22–32)
Calcium: 9.1 mg/dL (ref 8.9–10.3)
Chloride: 103 mmol/L (ref 98–111)
Creatinine: 0.87 mg/dL (ref 0.44–1.00)
GFR, Estimated: >60 mL/min (ref >60)
Glucose, Bld: 186 mg/dL — ABNORMAL HIGH (ref 70–99)
Potassium: 3.1 mmol/L — ABNORMAL LOW (ref 3.5–5.1)
Sodium: 137 mmol/L (ref 135–145)
Total Bilirubin: 1.1 mg/dL (ref 0.3–1.2)
Total Protein: 6.8 g/dL (ref 6.5–8.1)

## 2020-09-12 LAB — CBC WITH DIFFERENTIAL (CANCER CENTER ONLY)
Abs Immature Granulocytes: 0.03 10*3/uL (ref 0.00–0.07)
Basophils Absolute: 0.1 10*3/uL (ref 0.0–0.1)
Basophils Relative: 1 %
Eosinophils Absolute: 0.3 10*3/uL (ref 0.0–0.5)
Eosinophils Relative: 3 %
HCT: 36 % (ref 36.0–46.0)
Hemoglobin: 11.7 g/dL — ABNORMAL LOW (ref 12.0–15.0)
Immature Granulocytes: 0 %
Lymphocytes Relative: 20 %
Lymphs Abs: 1.8 10*3/uL (ref 0.7–4.0)
MCH: 26.8 pg (ref 26.0–34.0)
MCHC: 32.5 g/dL (ref 30.0–36.0)
MCV: 82.4 fL (ref 80.0–100.0)
Monocytes Absolute: 0.8 10*3/uL (ref 0.1–1.0)
Monocytes Relative: 8 %
Neutro Abs: 6.1 10*3/uL (ref 1.7–7.7)
Neutrophils Relative %: 68 %
Platelet Count: 315 10*3/uL (ref 150–400)
RBC: 4.37 MIL/uL (ref 3.87–5.11)
RDW: 14 % (ref 11.5–15.5)
WBC Count: 9.1 10*3/uL (ref 4.0–10.5)
nRBC: 0 % (ref 0.0–0.2)

## 2020-09-12 LAB — RESEARCH LABS

## 2020-09-12 MED ORDER — DEXAMETHASONE SODIUM PHOSPHATE 10 MG/ML IJ SOLN: 8.0000 mg | Freq: Once | INTRAMUSCULAR | Status: DC

## 2020-09-12 MED ORDER — SODIUM CHLORIDE 0.9 % IV SOLN
2400.0000 mg/m2 | INTRAVENOUS | Status: DC
  Administered 2020-09-12: 4200 mg via INTRAVENOUS
  Filled 2020-09-12: qty 84

## 2020-09-12 MED ORDER — DEXTROSE 5 % IV SOLN
Freq: Once | INTRAVENOUS | Status: AC
  Filled 2020-09-12: qty 250

## 2020-09-12 MED ORDER — SODIUM CHLORIDE 0.9% FLUSH
10.0000 mL | INTRAVENOUS | Status: DC | PRN
  Filled 2020-09-12: qty 10

## 2020-09-12 MED ORDER — PROCHLORPERAZINE EDISYLATE 10 MG/2ML IJ SOLN
10.0000 mg | Freq: Once | INTRAMUSCULAR | Status: AC
  Administered 2020-09-12: 10 mg via INTRAVENOUS

## 2020-09-12 MED ORDER — ATROPINE SULFATE 1 MG/ML IJ SOLN
INTRAMUSCULAR | Status: AC
  Filled 2020-09-12: qty 1

## 2020-09-12 MED ORDER — ATROPINE SULFATE 1 MG/ML IJ SOLN
0.5000 mg | Freq: Once | INTRAMUSCULAR | Status: AC | PRN
  Administered 2020-09-12: 0.5 mg via INTRAVENOUS

## 2020-09-12 MED ORDER — PROCHLORPERAZINE EDISYLATE 10 MG/2ML IJ SOLN
INTRAMUSCULAR | Status: AC
  Filled 2020-09-12: qty 2

## 2020-09-12 MED ORDER — SODIUM CHLORIDE 0.9 % IV SOLN
150.0000 mg | Freq: Once | INTRAVENOUS | Status: AC
  Administered 2020-09-12: 150 mg via INTRAVENOUS
  Filled 2020-09-12: qty 150

## 2020-09-12 MED ORDER — OLANZAPINE 2.5 MG PO TABS: 5.0000 mg | ORAL_TABLET | Freq: Once | ORAL | Status: DC

## 2020-09-12 MED ORDER — SODIUM CHLORIDE 0.9 % IV SOLN
150.0000 mg/m2 | Freq: Once | INTRAVENOUS | Status: AC
  Administered 2020-09-12: 260 mg via INTRAVENOUS
  Filled 2020-09-12: qty 13

## 2020-09-12 MED ORDER — SODIUM CHLORIDE 0.9 % IV SOLN
16.0000 mg | Freq: Once | INTRAVENOUS | Status: AC
  Administered 2020-09-12: 16 mg via INTRAVENOUS
  Filled 2020-09-12: qty 8

## 2020-09-12 MED ORDER — FAMOTIDINE IN NACL 20-0.9 MG/50ML-% IV SOLN
20.0000 mg | Freq: Once | INTRAVENOUS | Status: AC
  Administered 2020-09-12: 20 mg via INTRAVENOUS

## 2020-09-12 MED ORDER — SODIUM CHLORIDE 0.9 % IV SOLN
400.0000 mg/m2 | Freq: Once | INTRAVENOUS | Status: AC
  Administered 2020-09-12: 704 mg via INTRAVENOUS
  Filled 2020-09-12: qty 35.2

## 2020-09-12 MED ORDER — SODIUM CHLORIDE 0.9 % IV SOLN
10.0000 mg | Freq: Once | INTRAVENOUS | Status: AC
  Administered 2020-09-12: 10 mg via INTRAVENOUS
  Filled 2020-09-12: qty 10

## 2020-09-12 MED ORDER — HEPARIN SOD (PORK) LOCK FLUSH 100 UNIT/ML IV SOLN
500.0000 [IU] | Freq: Once | INTRAVENOUS | Status: DC | PRN
  Filled 2020-09-12: qty 5

## 2020-09-12 MED ORDER — FAMOTIDINE IN NACL 20-0.9 MG/50ML-% IV SOLN
INTRAVENOUS | Status: AC
  Filled 2020-09-12: qty 50

## 2020-09-12 MED ORDER — SODIUM CHLORIDE 0.9 % IV SOLN
INTRAVENOUS | Status: DC
  Filled 2020-09-12 (×2): qty 250

## 2020-09-12 MED ORDER — OXALIPLATIN CHEMO INJECTION 100 MG/20ML
85.0000 mg/m2 | Freq: Once | INTRAVENOUS | Status: AC
  Administered 2020-09-12: 150 mg via INTRAVENOUS
  Filled 2020-09-12: qty 20

## 2020-09-12 NOTE — Patient Instructions (Signed)

## 2020-09-12 NOTE — Progress Notes (Signed)
Patient's VS checked after fluids given. Patient states she feels better, no complaints of nausea or stomach cramps. Sandi Mealy, PA notified. Per Sandi Mealy, PA, ok for patient to be discharged home.

## 2020-09-12 NOTE — Patient Instructions (Signed)
[facility] Discharge Instructions for Patients Receiving Chemotherapy  Today you received the following chemotherapy agents oxaliplatin, luucovorin adrucil, irinotecan    To help prevent nausea and vomiting after your treatment, we encourage you to take your nausea medication as directed by MD    If you develop nausea and vomiting that is not controlled by your nausea medication, call the clinic.   BELOW ARE SYMPTOMS THAT SHOULD BE REPORTED IMMEDIATELY:  *FEVER GREATER THAN 100.5 F  *CHILLS WITH OR WITHOUT FEVER  NAUSEA AND VOMITING THAT IS NOT CONTROLLED WITH YOUR NAUSEA MEDICATION  *UNUSUAL SHORTNESS OF BREATH  *UNUSUAL BRUISING OR BLEEDING  TENDERNESS IN MOUTH AND THROAT WITH OR WITHOUT PRESENCE OF ULCERS  *URINARY PROBLEMS  *BOWEL PROBLEMS  UNUSUAL RASH Items with * indicate a potential emergency and should be followed up as soon as possible.  Feel free to call the clinic should you have any questions or concerns. The clinic phone number is [phone].  Please show the [facility] at check-in to the Emergency Department and triage nurse.

## 2020-09-12 NOTE — Progress Notes (Signed)
Patient here today for first time Folfirinox. Midway through irinotecan / Leucovorin patient experienced nausea without vomiting. Infusion stopped and  symptom management PA was notified. Compazine 10 mg Iv was given with some improvement. Nausea returned towards the end of treatment. Pepcid was given with some improvement. After connecting 5FU pump patinet reported being dizzy. Dr Burr Medico advised giving fluids. Fluids given

## 2020-09-12 NOTE — Research (Signed)
EXACT SCIENCES,BLOOD SAMPLE COLLECTION TO EVALUATE BIOMARKERS IN SUBJECTS WITH UNTREATED SOLID TUMORS  09/12/20    8:00 AM  BLOOD COLLECTION/GIFT CARD: Met with Jacqueline Tyler and her husband in the main waiting room to inform her once her blood collection is complete, she will receive a $50 gift card. Met with patient again just prior to blood collection in the flush room. Blood was collected via port access at 8:05 AM. Port was used due to multiple other labs at the same time and patient's first day of infusion. Tubes were collected in the proper order with the SST tube first, followed by 4 LBgard tubes. All tubes were filled and processed as described in the protocol. Blood will be shipped via kit # [id number] today by a clinical research specialist.  There were no adverse events and patient tolerated blood collection well. After completion of blood collection, patient and myself completed the gift card affirmation form and a $50 VISA gift card was given to patient along with instructions on how to activate. Patient was thanked for her time and participation in this research study.  RAVE: I enrolled patient in [place] and she was given PID [id number].   Carol Ada, RT(R)(T) Clinical Research Coordinator

## 2020-09-13 ENCOUNTER — Telehealth: Payer: Self-pay | Admitting: Hematology

## 2020-09-13 ENCOUNTER — Telehealth: Payer: Self-pay | Admitting: *Deleted

## 2020-09-13 LAB — CANCER ANTIGEN 19-9: CA 19-9: 735 U/mL — ABNORMAL HIGH (ref 0–35)

## 2020-09-13 MED ORDER — POTASSIUM CHLORIDE ER 10 MEQ PO TBCR: 10.0000 meq | EXTENDED_RELEASE_TABLET | Freq: Every day | ORAL | 0 refills | Status: DC

## 2020-09-13 NOTE — Telephone Encounter (Signed)
-----   Message from Truitt Merle, MD sent at 09/13/2020  9:27 AM EDT ----- Let pt know her K was little low, she is on HCTZ. Please call in KCL 55meq daily #30 for her. She did not feel well towards the end of chemo yesterday,check if she needs some IVF tomorrow with pump d/c, thanks   Truitt Merle

## 2020-09-13 NOTE — Telephone Encounter (Signed)
Scheduled appointments per 10/25 los. Will have updated calendar printed for patient at next visit.

## 2020-09-13 NOTE — Telephone Encounter (Signed)
TCT patient and spoke with her. Reviewed labs with her and advised that her K+ was a little low yesterday. A prescription for KCL is being sent in to her pharmacy for 71meq daily x 30 tabs.  She states she feels better today and is eating/drinking fluids well. Denies nausea. Slept well last night and felt better when she got up this am. No need for IV fluids tomorrow with her pump stop. She will pick up the KCL later today.

## 2020-09-14 ENCOUNTER — Inpatient Hospital Stay: Payer: 59

## 2020-09-14 ENCOUNTER — Other Ambulatory Visit: Payer: Self-pay

## 2020-09-14 DIAGNOSIS — C250 Malignant neoplasm of head of pancreas: Secondary | ICD-10-CM | POA: Diagnosis not present

## 2020-09-14 MED ORDER — SODIUM CHLORIDE 0.9% FLUSH
10.0000 mL | INTRAVENOUS | Status: DC | PRN
  Administered 2020-09-14: 10 mL
  Filled 2020-09-14: qty 10

## 2020-09-14 MED ORDER — HEPARIN SOD (PORK) LOCK FLUSH 100 UNIT/ML IV SOLN
500.0000 [IU] | Freq: Once | INTRAVENOUS | Status: AC | PRN
  Administered 2020-09-14: 500 [IU]
  Filled 2020-09-14: qty 5

## 2020-09-14 NOTE — Progress Notes (Signed)
Ms. Bilski was seen in the infusion room as she was receiving her first treatment of FOLFIRINOX.  She had completed oxaliplatin without any issues.  While receiving leucovorin irinotecan she reported nausea despite having been premedicated with Zofran, Emend, and Decadron.  She was given Compazine.  She was noted to have a blood pressure of 94/59, pulse of 64 and oxygen saturation of 99%.  Despite this intervention, she continued to have nausea.  She was given Pepcid 20 mg IV with some improvement and then was given IV fluids.  Sandi Mealy, MHS, PA-C Physician Assistant

## 2020-09-16 NOTE — Progress Notes (Signed)
Jacqueline Tyler   [phone] [phone]   Clinic Follow up Note   Patient Care Team: Nicola Girt, DO as PCP - General (Internal Medicine) Jonnie Finner, RN as Oncology Nurse Navigator Truitt Merle, MD as Consulting Physician (Oncology) Dwan Bolt, MD as Consulting Physician (General Surgery)  Date of Service:  09/19/2020  CHIEF COMPLAINT: F/u of pancreatic cancer  SUMMARY OF ONCOLOGIC HISTORY: Oncology History Overview Note  Cancer Staging Pancreatic cancer [facility]) Staging form: Exocrine Pancreas, AJCC 8th Edition - Clinical stage from 08/24/2020: Stage IIB (cT2, cN1, cM0) - Signed by Truitt Merle, MD on 08/30/2020    Pancreatic cancer ([place])  08/20/2020 Imaging   CT AP 08/20/20  IMPRESSION: 1. 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 Collateral circulation is noted. There is moderate intrahepatic and extrahepatic biliary dilatation which appears to be due to the pancreatic head mass. Pancreatic ductal dilatation is noted as well. 2. Probable 2.7 cm uterine fibroid. 3. Aortic atherosclerosis.   Aortic Atherosclerosis (ICD10-I70.0).   08/20/2020 Tumor Marker   Ca19-9 - 927   08/22/2020 Procedure   ERCP by Dr Watt Climes 08/22/20  IMPRESSION - The major papilla appeared normal. - A biliary sphincterotomy was performed. - Cells for cytology obtained in the lower third and middle of the main duct. - One plastic stent was placed into the common bile duct.   FINAL MICROSCOPIC DIAGNOSIS:  - No malignant cells identified  - Benign reactive/reparative changes   08/24/2020 Cancer Staging   Staging form: Exocrine Pancreas, AJCC 8th Edition - Clinical stage from 08/24/2020: Stage IIB (cT2, cN1, cM0) - Signed by Truitt Merle, MD on 08/30/2020   08/24/2020 Procedure    EUS by Dr Paulita Fujita 08/24/20  IMPRESSION - There was no sign of significant pathology in the ampulla. - A few malignant-appearing lymph nodes were visualized in the peripancreatic region and porta hepatis region. - One stent was visualized endosonographically in the common bile duct. - A mass was identified in the pancreatic head. Tissue was obtained from this exam. The preliminary diagnosis is consistent with adenocarcinoma. Invasion into SMV/PV seen. Lymphadenopathy noted. This was staged T3 N1 Mx by endosonographic criteria. Fine needle aspiration performed.   08/24/2020 Initial Biopsy   FINAL MICROSCOPIC DIAGNOSIS: 08/24/20 - Malignant cells consistent with adenocarcinoma    08/30/2020 Initial Diagnosis   Pancreatic cancer ([place])   09/05/2020 Imaging   CT Chest  IMPRESSION: 1. Stable 2.7 cm infiltrating pancreatic head mass with borderline enlarged peripancreatic lymph nodes. 2. No findings for pulmonary metastatic disease. 3. Small hiatal hernia. 4. Aortic atherosclerosis.   Aortic Atherosclerosis (ICD10-I70.0).   09/08/2020 Procedure   INSERTION PORT-A-CATH by Dr Zenia Resides and Barry Dienes    09/12/2020 -  Chemotherapy   Neoadjuvant FOLFIRINOX q2weeks starting 09/12/20       CURRENT THERAPY:  Neoadjuvant FOLFIRINOX q2 weeks starting 09/12/20  INTERVAL HISTORY:  Jacqueline Tyler is here for a follow up. She presents to the clinic alone. She notes poor toleration at the end of her first week. She notes she did have an episode of spinach in her stool that she ate. She notes she had antiemetics 3-4 times last week. She notes her cold sensitivity was not much because she stayed away. She notes during her first week she notes a depression episode.  She wonders if she should increase her Celexa. She notes being sick from chemo effected her mood. She had questions about her medications and treatment. She notes her job denied her working part time. She will have to follow up with HR about what her  options are.     REVIEW OF SYSTEMS:   Constitutional: Denies fevers, chills or abnormal weight loss Eyes: Denies blurriness of vision Ears, nose, mouth, throat, and face: Denies mucositis or sore throat Respiratory: Denies cough, dyspnea or wheezes Cardiovascular: Denies palpitation, chest discomfort or lower extremity swelling Gastrointestinal:  Denies nausea, heartburn or change in bowel habits Skin: Denies abnormal skin rashes Lymphatics: Denies new lymphadenopathy or easy bruising Neurological:Denies numbness, tingling or new weaknesses Behavioral/Psych: (+) Depressed mood  All other systems were reviewed with the patient and are negative.  MEDICAL HISTORY:  Past Medical History:  Diagnosis Date  . Anxiety   . Cancer ([place]) 07/2020   pancreatic cancer  . Depression   . Diabetes mellitus without complication ([place]) 16/6063   pancreatic cancer  . High cholesterol   . Hypertension     SURGICAL HISTORY: Past Surgical History:  Procedure Laterality Date  . BILIARY BRUSHING  08/22/2020   Procedure: BILIARY BRUSHING;  Surgeon: Clarene Essex, MD;  Location: [facility];  Service: Endoscopy;;  . BILIARY STENT PLACEMENT N/A 08/22/2020   Procedure: BILIARY STENT PLACEMENT;  Surgeon: Clarene Essex, MD;  Location: [facility];  Service: Endoscopy;  Laterality: N/A;  . ENDOSCOPIC RETROGRADE CHOLANGIOPANCREATOGRAPHY (ERCP) WITH PROPOFOL N/A 08/22/2020   Procedure: ENDOSCOPIC RETROGRADE CHOLANGIOPANCREATOGRAPHY (ERCP) WITH PROPOFOL;  Surgeon: Clarene Essex, MD;  Location: [facility];  Service: Endoscopy;  Laterality: N/A;  . ESOPHAGOGASTRODUODENOSCOPY (EGD) WITH PROPOFOL N/A 08/24/2020   Procedure: ESOPHAGOGASTRODUODENOSCOPY (EGD) WITH PROPOFOL;  Surgeon: Arta Silence, MD;  Location: [facility];  Service: Endoscopy;  Laterality: N/A;  . FINE NEEDLE ASPIRATION N/A 08/24/2020   Procedure: FINE NEEDLE ASPIRATION (FNA) LINEAR;  Surgeon: Arta Silence, MD;  Location: [facility];  Service:  Endoscopy;  Laterality: N/A;  . PORTACATH PLACEMENT Right 09/08/2020   Procedure: INSERTION PORT-A-CATH;  Surgeon: Dwan Bolt, MD;  Location: [place];  Service: General;  Laterality: Right;  . SPHINCTEROTOMY  08/22/2020   Procedure: SPHINCTEROTOMY;  Surgeon: Clarene Essex, MD;  Location: [facility];  Service: Endoscopy;;  . UPPER ESOPHAGEAL ENDOSCOPIC ULTRASOUND (EUS) N/A 08/24/2020   Procedure: UPPER ESOPHAGEAL ENDOSCOPIC ULTRASOUND (EUS);  Surgeon: Arta Silence, MD;  Location: [facility];  Service: Endoscopy;  Laterality: N/A;    I have reviewed the social history and family history with the patient and they are unchanged from previous note.  ALLERGIES:  is allergic to oxycodone-acetaminophen, poison ivy extract, and tyloxapol.  MEDICATIONS:  Current Outpatient Medications  Medication Sig Dispense Refill  . acetaminophen (TYLENOL) 325 MG tablet Take 650 mg by mouth every 6 (six) hours as needed.    . citalopram (CELEXA) 40 MG tablet Take 10 mg by mouth daily.     . cloNIDine (CATAPRES) 0.1 MG tablet Take 0.1 mg by mouth at bedtime.    . hydrochlorothiazide (HYDRODIURIL) 12.5 MG tablet Take 12.5 mg by mouth daily.    Marland Kitchen lidocaine-prilocaine (EMLA) cream Apply to affected area once 30 g 3  . omeprazole (PRILOSEC) 20 MG capsule Take 20 mg by mouth daily.    . ondansetron (ZOFRAN) 8 MG tablet Take 1 tablet (8 mg total) by mouth 2 (two) times daily as needed. Start on day 3 after chemotherapy. 30 tablet 1  .  potassium chloride (KLOR-CON) 10 MEQ tablet Take 1 tablet (10 mEq total) by mouth daily. 30 tablet 0  . prochlorperazine (COMPAZINE) 10 MG tablet Take 1 tablet (10 mg total) by mouth every 6 (six) hours as needed (Nausea or vomiting). 30 tablet 1  . simvastatin (ZOCOR) 80 MG tablet Take 80 mg by mouth daily.     No current facility-administered medications for this visit.    PHYSICAL EXAMINATION: ECOG PERFORMANCE STATUS: 1 - Symptomatic but completely  ambulatory  Vitals:   09/19/20 1349  BP: 138/77  Pulse: 83  Resp: 18  Temp: 99.7 F (37.6 C)  SpO2: 100%   Filed Weights   09/19/20 1349  Weight: 151 lb (68.5 kg)    GENERAL:alert, no distress and comfortable SKIN: skin color, texture, turgor are normal, no rashes or significant lesions (+) Brusing around PAC EYES: normal, Conjunctiva are pink and non-injected, sclera clear  NECK: supple, thyroid normal size, non-tender, without nodularity LYMPH:  no palpable lymphadenopathy in the cervical, axillary  LUNGS: clear to auscultation and percussion with normal breathing effort HEART: regular rate & rhythm and no murmurs and no lower extremity edema ABDOMEN:abdomen soft, non-tender and normal bowel sounds Musculoskeletal:no cyanosis of digits and no clubbing  NEURO: alert & oriented x 3 with fluent speech, no focal motor/sensory deficits  LABORATORY DATA:  I have reviewed the data as listed CBC Latest Ref Rng & Units 09/19/2020 09/12/2020 08/24/2020  WBC 4.0 - 10.5 K/uL 2.3(L) 9.1 10.7(H)  Hemoglobin 12.0 - 15.0 g/dL 11.7(L) 11.7(L) 10.1(L)  Hematocrit 36 - 46 % 35.6(L) 36.0 32.2(L)  Platelets 150 - 400 K/uL 86(L) 315 326     CMP Latest Ref Rng & Units 09/19/2020 09/12/2020 08/24/2020  Glucose 70 - 99 mg/dL 122(H) 186(H) 127(H)  BUN 6 - 20 mg/dL $Remove'11 12 17  'JHTAcMl$ Creatinine 0.44 - 1.00 mg/dL 0.78 0.87 0.83  Sodium 135 - 145 mmol/L 134(L) 137 139  Potassium 3.5 - 5.1 mmol/L 4.0 3.1(L) 3.7  Chloride 98 - 111 mmol/L 100 103 109  CO2 22 - 32 mmol/L $RemoveB'27 27 22  'LndezYvA$ Calcium 8.9 - 10.3 mg/dL 9.0 9.1 8.0(L)  Total Protein 6.5 - 8.1 g/dL 6.8 6.8 5.9(L)  Total Bilirubin 0.3 - 1.2 mg/dL 1.5(H) 1.1 2.6(H)  Alkaline Phos 38 - 126 U/L 854(H) 242(H) 610(H)  AST 15 - 41 U/L 155(H) 17 64(H)  ALT 0 - 44 U/L 214(H) 32 206(H)      RADIOGRAPHIC STUDIES: I have personally reviewed the radiological images as listed and agreed with the findings in the report. No results found.   ASSESSMENT & PLAN:   Jacqueline Tyler is a 59 y.o. female with    1. 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 She had CBD stent placed with ERCP on 08/22/20 to resolve her jaundice.  -Her 08/24/20 EUS with Dr Paulita Fujita showed her mass at head of pancreaswith SMV and PV invasion, biopsy confirmedadenocarcinoma withfew malignant-appearingLNs inthe peripancreaticandporta hepatis region.Given the vascular invasion, this is a borderline resectable. CT Chest negative for malignancy.  -She was seen by surgeon Dr Zenia Resides on 09/06/20, neoadjvuant chemo recommended  -Due to the borderline resectable disease, I started her on neoadjuvant chemotherapy with FOLFIRINOX q2weeks on 09/12/20 to downstage her borderline resectable disease for3-88months before surgery with possible additional adjuvant chemoafter surgery. Will scan her after 3 months treatment to monitor her response. -S/p  C1 week 1 she tolerating moderately well due to nausea and change in bowel habits for 3-4 days, recovered well now.  -Labs reviewed, WBC 2.3, Hg 11.7, plt 86K. I encouraged her to eat high protein, high calorie diet and maintain activity and exercise. I also encouraged her to maintain a positive mindset. I reviewed antiemetic use with her.  -F/u next week for C2.  -I answered all their questions to their satisfaction and understanding.    2. Obstructive Jaundice, Transaminitis, Hyperbilirubinuria  -Secondary to #1  -Her initial 08/20/20 labs showed AST 349, ALT 473, alk phos 903 and tbili 7.3. She only had Jaundice of skin and eyes. Otherwise asymptomatic.  -Plastic Stent placed by ERCP on 08/22/20 with Dr Watt Climes. May need stent exchange in the future.  -Jaundice resolved, liver function improved.   3. HTN, Hyperglycemia  -During recent  hospitalization for pancreatic cancer her 08/20/20 A1c was 7.1 She may require medication down the road given her pancreatic cancer.  -On Zocor, Clonidine and HCTZ for HTN. HCTZ held from start of chemo due to hypotension. She can continue to hold during first week of chemo and restart during second week if she has adequate eating and drinking.  -Continue to F/u with PCP  4. Financial assistance and Social support, Depression -She notes concern about continuing work when she can for money. She works for [employer] from home. Her husband recently retired but may return to work in near future.  -I encouraged her to consult with financial advocate or our financial office for assistance.  -She was not approved to work part time. I discussed if her job allows she is medically fine to work every other week.  -She is on Celexa $RemoveB'10mg'qUdaflve$  for her depression. She did feel depressed being sick during her first cycle chemo. I recommend maintain a positive mindset.     PLAN: -Labs, flush, F/u and FOLFIRINOX in 1 and 3 weeks    No problem-specific Assessment & Plan notes found for this encounter.   Orders Placed This Encounter  Procedures  . CA 19.9    Standing Status:   Standing    Number of Occurrences:   15    Standing Expiration Date:   09/19/2021   Pt and her husband had many questions. All questions were answered. The patient knows to call the clinic with any problems, questions or concerns. No barriers to learning was detected. The total time spent in the appointment was 30 minutes.     Truitt Merle, MD 09/19/2020   I, Joslyn Devon, am acting as scribe for Truitt Merle, MD.   I have reviewed the above documentation for accuracy and completeness, and I agree with the above.

## 2020-09-19 ENCOUNTER — Inpatient Hospital Stay: Payer: 59

## 2020-09-19 ENCOUNTER — Encounter: Payer: Self-pay | Admitting: Hematology

## 2020-09-19 ENCOUNTER — Inpatient Hospital Stay: Payer: 59 | Attending: Hematology | Admitting: Hematology

## 2020-09-19 ENCOUNTER — Other Ambulatory Visit: Payer: Self-pay

## 2020-09-19 VITALS — BP 138/77 | HR 83 | Temp 99.7°F | Resp 18 | Ht 62.0 in | Wt 151.0 lb

## 2020-09-19 DIAGNOSIS — C250 Malignant neoplasm of head of pancreas: Secondary | ICD-10-CM

## 2020-09-19 DIAGNOSIS — Z95828 Presence of other vascular implants and grafts: Secondary | ICD-10-CM

## 2020-09-19 DIAGNOSIS — R197 Diarrhea, unspecified: Secondary | ICD-10-CM | POA: Diagnosis not present

## 2020-09-19 DIAGNOSIS — C259 Malignant neoplasm of pancreas, unspecified: Secondary | ICD-10-CM

## 2020-09-19 DIAGNOSIS — I10 Essential (primary) hypertension: Secondary | ICD-10-CM | POA: Insufficient documentation

## 2020-09-19 DIAGNOSIS — R7401 Elevation of levels of liver transaminase levels: Secondary | ICD-10-CM | POA: Diagnosis not present

## 2020-09-19 DIAGNOSIS — Z79899 Other long term (current) drug therapy: Secondary | ICD-10-CM | POA: Insufficient documentation

## 2020-09-19 DIAGNOSIS — Z5111 Encounter for antineoplastic chemotherapy: Secondary | ICD-10-CM | POA: Insufficient documentation

## 2020-09-19 DIAGNOSIS — E876 Hypokalemia: Secondary | ICD-10-CM | POA: Diagnosis not present

## 2020-09-19 DIAGNOSIS — R634 Abnormal weight loss: Secondary | ICD-10-CM | POA: Insufficient documentation

## 2020-09-19 DIAGNOSIS — K831 Obstruction of bile duct: Secondary | ICD-10-CM | POA: Diagnosis not present

## 2020-09-19 LAB — CMP (CANCER CENTER ONLY)
ALT: 214 U/L — ABNORMAL HIGH (ref 0–44)
AST: 155 U/L — ABNORMAL HIGH (ref 15–41)
Albumin: 3.5 g/dL (ref 3.5–5.0)
Alkaline Phosphatase: 854 U/L — ABNORMAL HIGH (ref 38–126)
Anion gap: 7 (ref 5–15)
BUN: 11 mg/dL (ref 6–20)
CO2: 27 mmol/L (ref 22–32)
Calcium: 9 mg/dL (ref 8.9–10.3)
Chloride: 100 mmol/L (ref 98–111)
Creatinine: 0.78 mg/dL (ref 0.44–1.00)
GFR, Estimated: >60 mL/min (ref >60)
Glucose, Bld: 122 mg/dL — ABNORMAL HIGH (ref 70–99)
Potassium: 4 mmol/L (ref 3.5–5.1)
Sodium: 134 mmol/L — ABNORMAL LOW (ref 135–145)
Total Bilirubin: 1.5 mg/dL — ABNORMAL HIGH (ref 0.3–1.2)
Total Protein: 6.8 g/dL (ref 6.5–8.1)

## 2020-09-19 LAB — CBC WITH DIFFERENTIAL (CANCER CENTER ONLY)
Abs Immature Granulocytes: 0.01 10*3/uL (ref 0.00–0.07)
Basophils Absolute: 0 10*3/uL (ref 0.0–0.1)
Basophils Relative: 1 %
Eosinophils Absolute: 0 10*3/uL (ref 0.0–0.5)
Eosinophils Relative: 1 %
HCT: 35.6 % — ABNORMAL LOW (ref 36.0–46.0)
Hemoglobin: 11.7 g/dL — ABNORMAL LOW (ref 12.0–15.0)
Immature Granulocytes: 0 %
Lymphocytes Relative: 44 %
Lymphs Abs: 1 10*3/uL (ref 0.7–4.0)
MCH: 26.8 pg (ref 26.0–34.0)
MCHC: 32.9 g/dL (ref 30.0–36.0)
MCV: 81.5 fL (ref 80.0–100.0)
Monocytes Absolute: 0.1 10*3/uL (ref 0.1–1.0)
Monocytes Relative: 4 %
Neutro Abs: 1.1 10*3/uL — ABNORMAL LOW (ref 1.7–7.7)
Neutrophils Relative %: 50 %
Platelet Count: 86 10*3/uL — ABNORMAL LOW (ref 150–400)
RBC: 4.37 MIL/uL (ref 3.87–5.11)
RDW: 13.4 % (ref 11.5–15.5)
WBC Count: 2.3 10*3/uL — ABNORMAL LOW (ref 4.0–10.5)
nRBC: 0 % (ref 0.0–0.2)

## 2020-09-19 MED ORDER — SODIUM CHLORIDE 0.9% FLUSH
10.0000 mL | INTRAVENOUS | Status: DC | PRN
  Filled 2020-09-19: qty 10

## 2020-09-19 MED ORDER — HEPARIN SOD (PORK) LOCK FLUSH 100 UNIT/ML IV SOLN
500.0000 [IU] | Freq: Once | INTRAVENOUS | Status: DC
  Filled 2020-09-19: qty 5

## 2020-09-19 NOTE — Patient Instructions (Signed)
Implanted Port Insertion Implanted port insertion is a procedure to put in a port and catheter. The port is a device with an injectable disk that can be accessed by your health care provider. The port is connected to a vein in the chest or neck by a small flexible tube (catheter). There are different types of ports. The implanted port may be used as a long-term IV access for:  Medicines, such as chemotherapy.  Fluids.  Liquid nutrition, such as total parenteral nutrition (TPN). When you have a port, this means that your health care provider will not need to use the veins in your arms for these procedures. Tell a health care provider about:  Any allergies you have.  All medicines you are taking, especially blood thinners, as well as any vitamins, herbs, eye drops, creams, over-the-counter medicines, and steroids.  Any problems you or family members have had with anesthetic medicines.  Any blood disorders you have.  Any surgeries you have had.  Any medical conditions you have or have had, including diabetes or kidney problems.  Whether you are pregnant or may be pregnant. What are the risks? Generally, this is a safe procedure. However, problems may occur, including:  Allergic reactions to medicines or dyes.  Damage to other structures or organs.  Infection.  Damage to the blood vessel, bruising, or bleeding at the puncture site.  Blood clot.  Breakdown of the skin over the port.  A collection of air in the chest that can cause one of the lungs to collapse (pneumothorax). This is rare. What happens before the procedure? Medicines  Ask your health care provider about: ? Changing or stopping your regular medicines. This is especially important if you are taking diabetes medicines or blood thinners. ? Taking medicines such as aspirin and ibuprofen. These medicines can thin your blood. Do not take these medicines unless your health care provider tells you to take  them. ? Taking over-the-counter medicines, vitamins, herbs, and supplements. Staying hydrated Follow instructions from your health care provider about hydration, which may include:  Up to 2 hours before the procedure - you may continue to drink clear liquids, such as water, clear fruit juice, black coffee, and plain tea.  Eating and drinking restrictions  Follow instructions from your health care provider about eating and drinking, which may include: ? 8 hours before the procedure - stop eating heavy meals or foods, such as meat, fried foods, or fatty foods. ? 6 hours before the procedure - stop eating light meals or foods, such as toast or cereal. ? 6 hours before the procedure - stop drinking milk or drinks that contain milk. ? 2 hours before the procedure - stop drinking clear liquids. General instructions  Plan to have someone take you home from the hospital or clinic.  If you will be going home right after the procedure, plan to have someone with you for 24 hours.  You may have blood tests.  Do not use any products that contain nicotine or tobacco for at least 4-6 weeks before the procedure. These products include cigarettes, e-cigarettes, and chewing tobacco. If you need help quitting, ask your health care provider.  Ask your health care provider what steps will be taken to help prevent infection. These may include: ? Removing hair at the surgery site. ? Washing skin with a germ-killing soap. ? Taking antibiotic medicine. What happens during the procedure?   An IV will be inserted into one of your veins.  You will be given   one or more of the following: ? A medicine to help you relax (sedative). ? A medicine to numb the area (local anesthetic).  Two small incisions will be made to insert the port. ? One smaller incision will be made in your neck to get access to the vein where the catheter will lie. ? The other incision will be made in the upper chest. This is where the  port will lie.  The procedure may be done using continuous X-ray (fluoroscopy) or other imaging tools for guidance.  The port and catheter will be placed. There may be a small, raised area where the port is.  The port will be flushed with a salt solution (saline), and blood will be drawn to make sure that it is working correctly.  The incisions will be closed.  Bandages (dressings) may be placed over the incisions. The procedure may vary among health care providers and hospitals. What happens after the procedure?  Your blood pressure, heart rate, breathing rate, and blood oxygen level will be monitored until you leave the hospital or clinic.  Do not drive for 24 hours if you were given a sedative during your procedure.  You will be given a manufacturer's information card for the type of port that you have. Keep this with you.  Your port will need to be flushed and checked as told by your health care provider, usually every few weeks.  A chest X-ray will be done to: ? Check the placement of the port. ? Make sure there is no injury to your lung. Summary  Implanted port insertion is a procedure to put in a port and catheter.  The implanted port is used as a long-term IV access.  The port will need to be flushed and checked as told by your health care provider, usually every few weeks.  Keep your manufacturer's information card with you at all times. This information is not intended to replace advice given to you by your health care provider. Make sure you discuss any questions you have with your health care provider. Document Revised: 02/27/2019 Document Reviewed: 06/03/2018 Elsevier Patient Education  2020 [employer]  

## 2020-09-19 NOTE — Progress Notes (Signed)
Patient requested labs be drawn from her arm. Notified lab and sent her back to the lab

## 2020-09-20 LAB — CANCER ANTIGEN 19-9: CA 19-9: 634 U/mL — ABNORMAL HIGH (ref 0–35)

## 2020-09-21 ENCOUNTER — Telehealth: Payer: Self-pay

## 2020-09-21 NOTE — Telephone Encounter (Signed)
Jacqueline Tyler called stating she had "diarrhea", but actually sheis having soft stools a couple a day.  She also wanted information on what foods she should eat.  I told her to focus on protein and then carbohydrates.  I also recommended lower fiber foods.  She verbalized understanding.

## 2020-09-23 NOTE — Progress Notes (Signed)
[place]   [phone] [phone]   Clinic Follow up Note   Patient Care Team: Nicola Girt, DO as PCP - General (Internal Medicine) Jonnie Finner, RN as Oncology Nurse Navigator Truitt Merle, MD as Consulting Physician (Oncology) Dwan Bolt, MD as Consulting Physician (General Surgery)  Date of Service:  09/26/2020  CHIEF COMPLAINT:  F/u of pancreatic cancer  SUMMARY OF ONCOLOGIC HISTORY: Oncology History Overview Note  Cancer Staging Pancreatic cancer [facility]) Staging form: Exocrine Pancreas, AJCC 8th Edition - Clinical stage from 08/24/2020: Stage IIB (cT2, cN1, cM0) - Signed by Truitt Merle, MD on 08/30/2020    Pancreatic cancer ([place])  08/20/2020 Imaging   CT AP 08/20/20  IMPRESSION: 1. 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 Collateral circulation is noted. There is moderate intrahepatic and extrahepatic biliary dilatation which appears to be due to the pancreatic head mass. Pancreatic ductal dilatation is noted as well. 2. Probable 2.7 cm uterine fibroid. 3. Aortic atherosclerosis.   Aortic Atherosclerosis (ICD10-I70.0).   08/20/2020 Tumor Marker   Ca19-9 - 927   08/22/2020 Procedure   ERCP by Dr Watt Climes 08/22/20  IMPRESSION - The major papilla appeared normal. - A biliary sphincterotomy was performed. - Cells for cytology obtained in the lower third and middle of the main duct. - One plastic stent was placed into the common bile duct.   FINAL MICROSCOPIC DIAGNOSIS:  - No malignant cells identified  - Benign reactive/reparative changes   08/24/2020 Cancer Staging   Staging form: Exocrine Pancreas, AJCC 8th Edition - Clinical stage from 08/24/2020: Stage IIB (cT2, cN1, cM0) - Signed by Truitt Merle, MD on 08/30/2020   08/24/2020 Procedure    EUS by Dr Paulita Fujita 08/24/20  IMPRESSION - There was no sign of significant pathology in the ampulla. - A few malignant-appearing lymph nodes were visualized in the peripancreatic region and porta hepatis region. - One stent was visualized endosonographically in the common bile duct. - A mass was identified in the pancreatic head. Tissue was obtained from this exam. The preliminary diagnosis is consistent with adenocarcinoma. Invasion into SMV/PV seen. Lymphadenopathy noted. This was staged T3 N1 Mx by endosonographic criteria. Fine needle aspiration performed.   08/24/2020 Initial Biopsy   FINAL MICROSCOPIC DIAGNOSIS: 08/24/20 - Malignant cells consistent with adenocarcinoma    08/30/2020 Initial Diagnosis   Pancreatic cancer ([place])   09/05/2020 Imaging   CT Chest  IMPRESSION: 1. Stable 2.7 cm infiltrating pancreatic head mass with borderline enlarged peripancreatic lymph nodes. 2. No findings for pulmonary metastatic disease. 3. Small hiatal hernia. 4. Aortic atherosclerosis.   Aortic Atherosclerosis (ICD10-I70.0).   09/08/2020 Procedure   INSERTION PORT-A-CATH by Dr Zenia Resides and Barry Dienes    09/12/2020 -  Chemotherapy   Neoadjuvant FOLFIRINOX q2weeks starting 09/12/20       CURRENT THERAPY:  Neoadjuvant FOLFIRINOX q2 weeks starting 09/12/20  INTERVAL HISTORY:  Jacqueline Tyler is here for a follow up. She presents to the clinic alone. She notes she has had hair loss with chemo. She feels she recovered well from last cycle. She notes she did have mild weight loss, even on her scale. She notes holding HCTZ and her BP has been normal at home. She notes there is confusion about when she can return to work.  REVIEW OF SYSTEMS:   Constitutional: Denies fevers, chills or abnormal weight loss Eyes: Denies blurriness of vision Ears, nose, mouth, throat, and face: Denies mucositis or sore throat Respiratory: Denies cough, dyspnea or wheezes Cardiovascular: Denies palpitation, chest  discomfort or lower extremity swelling Gastrointestinal:  Denies nausea, heartburn or change in bowel habits Skin: (+) Hair loss Lymphatics: Denies new lymphadenopathy or easy bruising Neurological:Denies numbness, tingling or new weaknesses Behavioral/Psych: Mood is stable, no new changes  All other systems were reviewed with the patient and are negative.  MEDICAL HISTORY:  Past Medical History:  Diagnosis Date  . Anxiety   . Cancer ([place]) 07/2020   pancreatic cancer  . Depression   . Diabetes mellitus without complication ([place]) 83/4196   pancreatic cancer  . High cholesterol   . Hypertension     SURGICAL HISTORY: Past Surgical History:  Procedure Laterality Date  . BILIARY BRUSHING  08/22/2020   Procedure: BILIARY BRUSHING;  Surgeon: Clarene Essex, MD;  Location: [facility];  Service: Endoscopy;;  . BILIARY STENT PLACEMENT N/A 08/22/2020   Procedure: BILIARY STENT PLACEMENT;  Surgeon: Clarene Essex, MD;  Location: [facility];  Service: Endoscopy;  Laterality: N/A;  . ENDOSCOPIC RETROGRADE CHOLANGIOPANCREATOGRAPHY (ERCP) WITH PROPOFOL N/A 08/22/2020   Procedure: ENDOSCOPIC RETROGRADE CHOLANGIOPANCREATOGRAPHY (ERCP) WITH PROPOFOL;  Surgeon: Clarene Essex, MD;  Location: [facility];  Service: Endoscopy;  Laterality: N/A;  . ESOPHAGOGASTRODUODENOSCOPY (EGD) WITH PROPOFOL N/A 08/24/2020   Procedure: ESOPHAGOGASTRODUODENOSCOPY (EGD) WITH PROPOFOL;  Surgeon: Arta Silence, MD;  Location: [facility];  Service: Endoscopy;  Laterality: N/A;  . FINE NEEDLE ASPIRATION N/A 08/24/2020   Procedure: FINE NEEDLE ASPIRATION (FNA) LINEAR;  Surgeon: Arta Silence, MD;  Location: [facility];  Service: Endoscopy;  Laterality: N/A;  . PORTACATH PLACEMENT Right 09/08/2020   Procedure: INSERTION PORT-A-CATH;  Surgeon: Dwan Bolt, MD;  Location: [facility];  Service: General;  Laterality: Right;  . SPHINCTEROTOMY  08/22/2020   Procedure: SPHINCTEROTOMY;  Surgeon: Clarene Essex, MD;   Location: [facility];  Service: Endoscopy;;  . UPPER ESOPHAGEAL ENDOSCOPIC ULTRASOUND (EUS) N/A 08/24/2020   Procedure: UPPER ESOPHAGEAL ENDOSCOPIC ULTRASOUND (EUS);  Surgeon: Arta Silence, MD;  Location: [facility];  Service: Endoscopy;  Laterality: N/A;    I have reviewed the social history and family history with the patient and they are unchanged from previous note.  ALLERGIES:  is allergic to oxycodone-acetaminophen, poison ivy extract, and tyloxapol.  MEDICATIONS:  Current Outpatient Medications  Medication Sig Dispense Refill  . pegfilgrastim-bmez (ZIEXTENZO) 6 MG/0.6ML injection Inject 6 mg into the skin every 14 (fourteen) days.    Marland Kitchen acetaminophen (TYLENOL) 325 MG tablet Take 650 mg by mouth every 6 (six) hours as needed.    . citalopram (CELEXA) 40 MG tablet Take 10 mg by mouth daily.     . cloNIDine (CATAPRES) 0.1 MG tablet Take 0.1 mg by mouth at bedtime.    . hydrochlorothiazide (HYDRODIURIL) 12.5 MG tablet Take 12.5 mg by mouth daily.    Marland Kitchen lidocaine-prilocaine (EMLA) cream Apply to affected area once 30 g 3  . omeprazole (PRILOSEC) 20 MG capsule Take 20 mg by mouth daily.    . ondansetron (ZOFRAN) 8 MG tablet Take 1 tablet (8 mg total) by mouth 2 (two) times daily as needed. Start on day 3 after chemotherapy. 30 tablet 1  . potassium chloride (KLOR-CON) 10 MEQ tablet Take 1 tablet (10 mEq total) by mouth daily. 30 tablet 0  . prochlorperazine (COMPAZINE) 10 MG tablet Take 1 tablet (10 mg  total) by mouth every 6 (six) hours as needed (Nausea or vomiting). 30 tablet 1  . simvastatin (ZOCOR) 80 MG tablet Take 80 mg by mouth daily.     No current facility-administered medications for this visit.   Facility-Administered Medications Ordered in Other Visits  Medication Dose Route Frequency Provider Last Rate Last Admin  . atropine injection 0.5 mg  0.5 mg Intravenous Once PRN Truitt Merle, MD      . fluorouracil (ADRUCIL) 4,200 mg in sodium chloride 0.9 % 66 mL chemo infusion   2,400 mg/m2 (Treatment Plan Recorded) Intravenous 1 day or 1 dose Truitt Merle, MD      . fosaprepitant (EMEND) 150 mg in sodium chloride 0.9 % 145 mL IVPB  150 mg Intravenous Once Truitt Merle, MD 450 mL/hr at 09/26/20 1117 150 mg at 09/26/20 1117  . heparin lock flush 100 unit/mL  500 Units Intracatheter Once PRN Truitt Merle, MD      . irinotecan (CAMPTOSAR) 260 mg in sodium chloride 0.9 % 500 mL chemo infusion  150 mg/m2 (Treatment Plan Recorded) Intravenous Once Truitt Merle, MD      . leucovorin 704 mg in sodium chloride 0.9 % 250 mL infusion  400 mg/m2 (Treatment Plan Recorded) Intravenous Once Truitt Merle, MD      . ondansetron (ZOFRAN) 16 mg in sodium chloride 0.9 % 50 mL IVPB  16 mg Intravenous Once Truitt Merle, MD      . oxaliplatin (ELOXATIN) 150 mg in dextrose 5 % 500 mL chemo infusion  85 mg/m2 (Treatment Plan Recorded) Intravenous Once Truitt Merle, MD      . sodium chloride flush (NS) 0.9 % injection 10 mL  10 mL Intracatheter PRN Truitt Merle, MD        PHYSICAL EXAMINATION: ECOG PERFORMANCE STATUS: 1 - Symptomatic but completely ambulatory  Vitals:   09/26/20 0938  BP: (!) 141/65  Resp: 18  Temp: 97.6 F (36.4 C)  SpO2: 100%   Filed Weights   09/26/20 0938  Weight: 152 lb 6.4 oz (69.1 kg)    Due to COVID19 we will limit examination to appearance. Patient had no complaints.  GENERAL:alert, no distress and comfortable SKIN: skin color normal, no rashes or significant lesions EYES: normal, Conjunctiva are pink and non-injected, sclera clear  NEURO: alert & oriented x 3 with fluent speech   LABORATORY DATA:  I have reviewed the data as listed CBC Latest Ref Rng & Units 09/26/2020 09/19/2020 09/12/2020  WBC 4.0 - 10.5 K/uL 17.4(H) 2.3(L) 9.1  Hemoglobin 12.0 - 15.0 g/dL 10.6(L) 11.7(L) 11.7(L)  Hematocrit 36 - 46 % 32.1(L) 35.6(L) 36.0  Platelets 150 - 400 K/uL 398 86(L) 315     CMP Latest Ref Rng & Units 09/26/2020 09/19/2020 09/12/2020  Glucose 70 - 99 mg/dL 179(H) 122(H) 186(H)   BUN 6 - 20 mg/dL _0 Creatinine 0.44 - 1.00 mg/dL 0.84 0.78 0.87  Sodium 135 - 145 mmol/L 139 134(L) 137  Potassium 3.5 - 5.1 mmol/L 3.0(L) 4.0 3.1(L)  Chloride 98 - 111 mmol/L 104 100 103  CO2 22 - 32 mmol/L _1 Calcium 8.9 - 10.3 mg/dL 8.6(L) 9.0 9.1  Total Protein 6.5 - 8.1 g/dL 6.5 6.8 6.8  Total Bilirubin 0.3 - 1.2 mg/dL 0.6 1.5(H) 1.1  Alkaline Phos 38 - 126 U/L 1,044(H) 854(H) 242(H)  AST 15 - 41 U/L 56(H) 155(H) 17  ALT 0 - 44 U/L 113(H) 214(H) 32      RADIOGRAPHIC STUDIES: I  have personally reviewed the radiological images as listed and agreed with the findings in the report. No results found.   ASSESSMENT & PLAN:  Jacqueline Tyler is a 59 y.o. female with    1. 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 She had CBD stent placed with ERCP on 08/22/20 to resolve her jaundice.  -Her 08/24/20 EUS with Dr Paulita Fujita showed her mass at head of pancreaswith SMV and PV invasion, biopsy confirmedadenocarcinoma withfew malignant-appearingLNs inthe peripancreaticandporta hepatis region.Given the vascular invasion, this is a borderline resectable. CT Chest negative for malignancy.  -She was seen bysurgeon Dr Zenia Resides on 09/06/20, neoadjuvant chemo recommended -Due to the borderline resectable disease, I started her on neoadjuvant chemotherapy withFOLFIRINOX q2weeks on 09/12/20 to downstage her borderline resectable disease for3-40month before surgery with possible additional adjuvant chemoafter surgery. Will scan her after 6 cycles of treatment to monitor her response. -She tolerated the first cycle chemo moderate well and has recovered from cycle 1 treatment well .Labs reviewed and adequate to proceed with C2 today at same dose. If needed will give IV Fluids with pump d/c.  -F/u  in 2 weeks before cycle 3 -restaging CT after cycle 6    2. Obstructive Jaundice, Transaminitis, Hyperbilirubinuria, Secondary to #1  -Her initial 08/20/20 labs showed AST 349, ALT 473, alk phos 903 and tbili 7.3. She only had Jaundice of skin and eyes. Otherwise asymptomatic.  -Plastic Stent placed by ERCP on 08/22/20 with Dr MWatt Climes May need stent exchange in the future. -Jaundice resolved, liver function improved.   3. HTN, Hyperglycemia  -During recent hospitalization for pancreatic cancer her 08/20/20 A1c was 7.1 She may require medication down the road given her pancreatic cancer.  -On Zocor, Clonidine and HCTZ for HTN.HCTZ held from start of chemo due to hypotension.  -Continue to F/u with PCP  4. Financial assistance and Social support, Depression -She notes concern about continuing work when she can for money. She works for [employer] home. Her husband recently retired but may return to work in near future.  -I encouraged her to consult with financial advocate or our financial office for assistance.  -She was not approved to work part time. I discussed if her job allows she is medically fine to work every other week.  -She is on Celexa 140mfor her depression. She did feel depressed being sick during her first cycle chemo. I recommend maintain a positive mindset.     PLAN: -Labs reviewed and adequate to proceed with C2 FOLFIRINOX today at same dose  -Labs, flush, F/u and FOLFIRINOX in 2 and 4 weeks    No problem-specific Assessment & Plan notes found for this encounter.   No orders of the defined types were placed in this encounter.  All questions were answered. The patient knows to call the clinic with any problems, questions or concerns. No barriers to learning was detected. The total time spent in the appointment was 30 minutes.     YaTruitt MerleMD 09/26/2020   I, AmJoslyn Devonam acting as scribe for YaTruitt MerleMD.   I have reviewed the above  documentation for accuracy and completeness, and I agree with the above.

## 2020-09-26 ENCOUNTER — Inpatient Hospital Stay: Payer: 59

## 2020-09-26 ENCOUNTER — Encounter: Payer: Self-pay | Admitting: Hematology

## 2020-09-26 ENCOUNTER — Inpatient Hospital Stay (HOSPITAL_BASED_OUTPATIENT_CLINIC_OR_DEPARTMENT_OTHER): Payer: 59 | Admitting: Hematology

## 2020-09-26 ENCOUNTER — Other Ambulatory Visit: Payer: Self-pay

## 2020-09-26 VITALS — BP 153/73 | HR 92 | Resp 20

## 2020-09-26 VITALS — BP 141/65 | Temp 97.6°F | Resp 18 | Ht 62.0 in | Wt 152.4 lb

## 2020-09-26 DIAGNOSIS — C250 Malignant neoplasm of head of pancreas: Secondary | ICD-10-CM

## 2020-09-26 DIAGNOSIS — C259 Malignant neoplasm of pancreas, unspecified: Secondary | ICD-10-CM

## 2020-09-26 DIAGNOSIS — I10 Essential (primary) hypertension: Secondary | ICD-10-CM

## 2020-09-26 DIAGNOSIS — Z95828 Presence of other vascular implants and grafts: Secondary | ICD-10-CM

## 2020-09-26 LAB — CBC WITH DIFFERENTIAL (CANCER CENTER ONLY)
Abs Immature Granulocytes: 2.1 10*3/uL — ABNORMAL HIGH (ref 0.00–0.07)
Band Neutrophils: 5 %
Basophils Absolute: 0 10*3/uL (ref 0.0–0.1)
Basophils Relative: 0 %
Eosinophils Absolute: 0.2 10*3/uL (ref 0.0–0.5)
Eosinophils Relative: 1 %
HCT: 32.1 % — ABNORMAL LOW (ref 36.0–46.0)
Hemoglobin: 10.6 g/dL — ABNORMAL LOW (ref 12.0–15.0)
Lymphocytes Relative: 14 %
Lymphs Abs: 2.4 10*3/uL (ref 0.7–4.0)
MCH: 27.1 pg (ref 26.0–34.0)
MCHC: 33 g/dL (ref 30.0–36.0)
MCV: 82.1 fL (ref 80.0–100.0)
Metamyelocytes Relative: 8 %
Monocytes Absolute: 1.6 10*3/uL — ABNORMAL HIGH (ref 0.1–1.0)
Monocytes Relative: 9 %
Myelocytes: 3 %
Neutro Abs: 11.1 10*3/uL — ABNORMAL HIGH (ref 1.7–7.7)
Neutrophils Relative %: 59 %
Platelet Count: 398 10*3/uL (ref 150–400)
Promyelocytes Relative: 1 %
RBC: 3.91 MIL/uL (ref 3.87–5.11)
RDW: 14.2 % (ref 11.5–15.5)
WBC Count: 17.4 10*3/uL — ABNORMAL HIGH (ref 4.0–10.5)
nRBC: 0.2 % (ref 0.0–0.2)

## 2020-09-26 LAB — CMP (CANCER CENTER ONLY)
ALT: 113 U/L — ABNORMAL HIGH (ref 0–44)
AST: 56 U/L — ABNORMAL HIGH (ref 15–41)
Albumin: 3 g/dL — ABNORMAL LOW (ref 3.5–5.0)
Alkaline Phosphatase: 1044 U/L — ABNORMAL HIGH (ref 38–126)
Anion gap: 11 (ref 5–15)
BUN: 6 mg/dL (ref 6–20)
CO2: 24 mmol/L (ref 22–32)
Calcium: 8.6 mg/dL — ABNORMAL LOW (ref 8.9–10.3)
Chloride: 104 mmol/L (ref 98–111)
Creatinine: 0.84 mg/dL (ref 0.44–1.00)
GFR, Estimated: >60 mL/min (ref >60)
Glucose, Bld: 179 mg/dL — ABNORMAL HIGH (ref 70–99)
Potassium: 3 mmol/L — ABNORMAL LOW (ref 3.5–5.1)
Sodium: 139 mmol/L (ref 135–145)
Total Bilirubin: 0.6 mg/dL (ref 0.3–1.2)
Total Protein: 6.5 g/dL (ref 6.5–8.1)

## 2020-09-26 MED ORDER — FAMOTIDINE IN NACL 20-0.9 MG/50ML-% IV SOLN
INTRAVENOUS | Status: AC
  Filled 2020-09-26: qty 50

## 2020-09-26 MED ORDER — FAMOTIDINE IN NACL 20-0.9 MG/50ML-% IV SOLN
20.0000 mg | Freq: Once | INTRAVENOUS | Status: AC | PRN
  Administered 2020-09-26: 20 mg via INTRAVENOUS

## 2020-09-26 MED ORDER — HEPARIN SOD (PORK) LOCK FLUSH 100 UNIT/ML IV SOLN
500.0000 [IU] | Freq: Once | INTRAVENOUS | Status: DC | PRN
  Filled 2020-09-26: qty 5

## 2020-09-26 MED ORDER — SODIUM CHLORIDE 0.9 % IV SOLN
10.0000 mg | Freq: Once | INTRAVENOUS | Status: AC
  Administered 2020-09-26: 10 mg via INTRAVENOUS
  Filled 2020-09-26: qty 10

## 2020-09-26 MED ORDER — ATROPINE SULFATE 1 MG/ML IJ SOLN
INTRAMUSCULAR | Status: AC
  Filled 2020-09-26: qty 1

## 2020-09-26 MED ORDER — SODIUM CHLORIDE 0.9 % IV SOLN
150.0000 mg/m2 | Freq: Once | INTRAVENOUS | Status: AC
  Administered 2020-09-26: 260 mg via INTRAVENOUS
  Filled 2020-09-26: qty 13

## 2020-09-26 MED ORDER — SODIUM CHLORIDE 0.9 % IV SOLN
2400.0000 mg/m2 | INTRAVENOUS | Status: DC
  Administered 2020-09-26: 4200 mg via INTRAVENOUS
  Filled 2020-09-26: qty 84

## 2020-09-26 MED ORDER — ATROPINE SULFATE 1 MG/ML IJ SOLN
0.5000 mg | Freq: Once | INTRAMUSCULAR | Status: AC | PRN
  Administered 2020-09-26: 0.5 mg via INTRAVENOUS

## 2020-09-26 MED ORDER — SODIUM CHLORIDE 0.9 % IV SOLN
16.0000 mg | Freq: Once | INTRAVENOUS | Status: AC
  Administered 2020-09-26: 16 mg via INTRAVENOUS
  Filled 2020-09-26: qty 8

## 2020-09-26 MED ORDER — SODIUM CHLORIDE 0.9 % IV SOLN
400.0000 mg/m2 | Freq: Once | INTRAVENOUS | Status: AC
  Administered 2020-09-26: 704 mg via INTRAVENOUS
  Filled 2020-09-26: qty 35.2

## 2020-09-26 MED ORDER — SODIUM CHLORIDE 0.9 % IV SOLN
150.0000 mg | Freq: Once | INTRAVENOUS | Status: AC
  Administered 2020-09-26: 150 mg via INTRAVENOUS
  Filled 2020-09-26: qty 150

## 2020-09-26 MED ORDER — OXALIPLATIN CHEMO INJECTION 100 MG/20ML
85.0000 mg/m2 | Freq: Once | INTRAVENOUS | Status: AC
  Administered 2020-09-26: 150 mg via INTRAVENOUS
  Filled 2020-09-26: qty 20

## 2020-09-26 MED ORDER — SODIUM CHLORIDE 0.9% FLUSH
10.0000 mL | INTRAVENOUS | Status: DC | PRN
  Administered 2020-09-26: 10 mL
  Filled 2020-09-26: qty 10

## 2020-09-26 MED ORDER — SODIUM CHLORIDE 0.9% FLUSH
10.0000 mL | INTRAVENOUS | Status: DC | PRN
  Filled 2020-09-26: qty 10

## 2020-09-26 MED ORDER — DEXTROSE 5 % IV SOLN
Freq: Once | INTRAVENOUS | Status: AC
  Filled 2020-09-26: qty 250

## 2020-09-26 NOTE — Patient Instructions (Signed)
[facility] Discharge Instructions for Patients Receiving Chemotherapy  Today you received the following chemotherapy agents: Oxaliplatin, Irinotecan, Leucovorin, and Fluorouracil.  To help prevent nausea and vomiting after your treatment, we encourage you to take your nausea medication as directed by your MD.   If you develop nausea and vomiting that is not controlled by your nausea medication, call the clinic.   BELOW ARE SYMPTOMS THAT SHOULD BE REPORTED IMMEDIATELY:  *FEVER GREATER THAN 100.5 F  *CHILLS WITH OR WITHOUT FEVER  NAUSEA AND VOMITING THAT IS NOT CONTROLLED WITH YOUR NAUSEA MEDICATION  *UNUSUAL SHORTNESS OF BREATH  *UNUSUAL BRUISING OR BLEEDING  TENDERNESS IN MOUTH AND THROAT WITH OR WITHOUT PRESENCE OF ULCERS  *URINARY PROBLEMS  *BOWEL PROBLEMS  UNUSUAL RASH Items with * indicate a potential emergency and should be followed up as soon as possible.  Feel free to call the clinic should you have any questions or concerns. The clinic phone number is [phone].  Please show the [facility] at check-in to the Emergency Department and triage nurse.  [facility] Discharge Instructions for Patients receiving Home Portable Chemo Pump    **The bag should finish at 46 hours, 96 hours or 7 days. For example, if your pump is scheduled for 46 hours and it was put on at 4pm, it should finish at 2 pm the day it is scheduled to come off regardless of your appointment time.    Estimated time to finish   _________________________ (Have your nurse fill in)     ** if the display on your pump reads "Low Volume" and it is beeping, take the batteries out of the pump and come to the cancer center for it to be taken off.   **If the pump alarms go off prior to the pump reading "Low Volume" then call the [phone] and someone can assist you.  **If the plunger comes out and the bag fluid is running out, please use your chemo spill kit to  clean up the spill. Do not use paper towels or other house hold products.  ** If you have problems or questions regarding your pump, please call either the [phone] or the cancer center Monday-Friday 8:00am-4:30pm at [phone] and we will assist you.  If you are unable to get assistance then go to [facility] Emergency Room, ask the staff to contact the IV team for assistance.

## 2020-09-26 NOTE — Progress Notes (Signed)
Met with patient and spouse at registration to introduce myself in person as Arboriculturist and to offer available resources.  Discussed one-time $1000 Radio broadcast assistant to assist with personal expenses while going through treatment. Patient currently not working but may return at reduced hours and spouse SS income. Advised her to contact me regarding income decision and guidelines to assess for grant. They verbalized understanding. Also discussed possible copay assistance may be available for treatment co-insurance and specific income information would be needed.  Gave my card if interested in applying and for any additional financial questions or concerns.

## 2020-09-26 NOTE — Progress Notes (Signed)
Pt complained of dizziness, denies chest pain, and no shortness of breath noted. Sandi Mealy PA notified and Pepcid given per orders. Pt. states dizziness subsided after pepcid given and chemotherapy restarted.

## 2020-09-27 LAB — CANCER ANTIGEN 19-9: CA 19-9: 272 U/mL — ABNORMAL HIGH (ref 0–35)

## 2020-09-28 ENCOUNTER — Other Ambulatory Visit: Payer: Self-pay

## 2020-09-28 ENCOUNTER — Inpatient Hospital Stay: Payer: 59

## 2020-09-28 VITALS — BP 174/86 | HR 65 | Temp 98.0°F | Resp 18

## 2020-09-28 DIAGNOSIS — C250 Malignant neoplasm of head of pancreas: Secondary | ICD-10-CM

## 2020-09-28 MED ORDER — HEPARIN SOD (PORK) LOCK FLUSH 100 UNIT/ML IV SOLN
500.0000 [IU] | Freq: Once | INTRAVENOUS | Status: AC | PRN
  Administered 2020-09-28: 500 [IU]
  Filled 2020-09-28: qty 5

## 2020-09-28 MED ORDER — SODIUM CHLORIDE 0.9% FLUSH
10.0000 mL | INTRAVENOUS | Status: DC | PRN
  Administered 2020-09-28: 10 mL
  Filled 2020-09-28: qty 10

## 2020-09-29 ENCOUNTER — Encounter: Payer: Self-pay | Admitting: Hematology

## 2020-09-29 ENCOUNTER — Telehealth: Payer: Self-pay | Admitting: *Deleted

## 2020-09-29 NOTE — Telephone Encounter (Signed)
Received vm call from pt asking for return call regarding STD.  Returned call & she reported diarrhea after drinking Ensure & thinks it is from Ensure.  She states that Ensure has not caused diarrhea before except last time after pump d/c.  Informed that it may be from chemo instead & suggested Imodium which she has already taken. Reminded of early/late diarrhea with Irinotecan & to look through her education notes for instructions & to call back if needed.  Regarding STD, encouraged to wait on call from [place] let us know what she needs after that.

## 2020-09-29 NOTE — Progress Notes (Signed)
Spoke with patient and spouse whom had financial and insurance questions regarding treatment.  Answered questions to the best of my ability and referred to insurance company/billing to confirm payments and actual balances owed. Discussed copay assistance and qualifications should they be needed to assist with co-insurance after insurance pays. They were very appreciative.  They have my card for any additional financial questions or concerns.

## 2020-09-29 NOTE — Telephone Encounter (Signed)
Called pt after receiving notification from On-Call service from yest.  Pt reports that she was able to give her injection & is waiting on Disability/[place] to call her back.  She also reports numbness/tingling of tongue is better.  She didn't take Claritin but has not had bone pain before.  She may try Claritin to see if that helps with the tingling.

## 2020-10-07 ENCOUNTER — Encounter: Payer: Self-pay | Admitting: Hematology

## 2020-10-07 MED FILL — Dexamethasone Sodium Phosphate Inj 100 MG/10ML: INTRAMUSCULAR | Qty: 1 | Status: AC

## 2020-10-07 NOTE — Progress Notes (Signed)
[place]   [phone] [phone]   Clinic Follow up Note   Patient Care Team: Nicola Girt, DO as PCP - General (Internal Medicine) Jonnie Finner, RN as Oncology Nurse Navigator Truitt Merle, MD as Consulting Physician (Oncology) Dwan Bolt, MD as Consulting Physician (General Surgery)  Date of Service:  10/10/2020  CHIEF COMPLAINT: F/u of pancreatic cancer  SUMMARY OF ONCOLOGIC HISTORY: Oncology History Overview Note  Cancer Staging Pancreatic cancer [facility]) Staging form: Exocrine Pancreas, AJCC 8th Edition - Clinical stage from 08/24/2020: Stage IIB (cT2, cN1, cM0) - Signed by Truitt Merle, MD on 08/30/2020    Pancreatic cancer ([place])  08/20/2020 Imaging   CT AP 08/20/20  IMPRESSION: 1. 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 Collateral circulation is noted. There is moderate intrahepatic and extrahepatic biliary dilatation which appears to be due to the pancreatic head mass. Pancreatic ductal dilatation is noted as well. 2. Probable 2.7 cm uterine fibroid. 3. Aortic atherosclerosis.   Aortic Atherosclerosis (ICD10-I70.0).   08/20/2020 Tumor Marker   Ca19-9 - 927   08/22/2020 Procedure   ERCP by Dr Watt Climes 08/22/20  IMPRESSION - The major papilla appeared normal. - A biliary sphincterotomy was performed. - Cells for cytology obtained in the lower third and middle of the main duct. - One plastic stent was placed into the common bile duct.   FINAL MICROSCOPIC DIAGNOSIS:  - No malignant cells identified  - Benign reactive/reparative changes   08/24/2020 Cancer Staging   Staging form: Exocrine Pancreas, AJCC 8th Edition - Clinical stage from 08/24/2020: Stage IIB (cT2, cN1, cM0) - Signed by Truitt Merle, MD on 08/30/2020   08/24/2020 Procedure    EUS by Dr Paulita Fujita 08/24/20  IMPRESSION - There was no sign of significant pathology in the ampulla. - A few malignant-appearing lymph nodes were visualized in the peripancreatic region and porta hepatis region. - One stent was visualized endosonographically in the common bile duct. - A mass was identified in the pancreatic head. Tissue was obtained from this exam. The preliminary diagnosis is consistent with adenocarcinoma. Invasion into SMV/PV seen. Lymphadenopathy noted. This was staged T3 N1 Mx by endosonographic criteria. Fine needle aspiration performed.   08/24/2020 Initial Biopsy   FINAL MICROSCOPIC DIAGNOSIS: 08/24/20 - Malignant cells consistent with adenocarcinoma    08/30/2020 Initial Diagnosis   Pancreatic cancer ([place])   09/05/2020 Imaging   CT Chest  IMPRESSION: 1. Stable 2.7 cm infiltrating pancreatic head mass with borderline enlarged peripancreatic lymph nodes. 2. No findings for pulmonary metastatic disease. 3. Small hiatal hernia. 4. Aortic atherosclerosis.   Aortic Atherosclerosis (ICD10-I70.0).   09/08/2020 Procedure   INSERTION PORT-A-CATH by Dr Zenia Resides and Barry Dienes    09/12/2020 -  Chemotherapy   Neoadjuvant FOLFIRINOX q2weeks starting 09/12/20       CURRENT THERAPY:  Neoadjuvant FOLFIRINOX q2 weeks starting 09/12/20  INTERVAL HISTORY:  Mikaelah Trostle Dea is here for a follow up. She presents to the clinic alone. She notes after C2 she had 2 Bm a day everyday with loose stool. She has tried imodium. She has had an BM accident. She notes after C2 she felt dizzy. She was given Pepcid which then make her shaky and weak. She notes she has tried to ask of work until the end of 12/31. She is  not sure if this has been approved. Some days in the past 2 cycles she was not able to stay at work the whole day. She notes major hair loss after her first cycle of chemo so she shaved her head.     REVIEW OF SYSTEMS:   Constitutional: Denies fevers, chills or abnormal weight  loss Eyes: Denies blurriness of vision Ears, nose, mouth, throat, and face: Denies mucositis or sore throat Respiratory: Denies cough, dyspnea or wheezes Cardiovascular: Denies palpitation, chest discomfort or lower extremity swelling Gastrointestinal:  Denies nausea, heartburn (+) Diarrhea  Skin: Denies abnormal skin rashes Lymphatics: Denies new lymphadenopathy or easy bruising Neurological:Denies numbness, tingling or new weaknesses Behavioral/Psych: Mood is stable, no new changes  All other systems were reviewed with the patient and are negative.  MEDICAL HISTORY:  Past Medical History:  Diagnosis Date  . Anxiety   . Cancer ([place]) 07/2020   pancreatic cancer  . Depression   . Diabetes mellitus without complication ([place]) 89/3810   pancreatic cancer  . High cholesterol   . Hypertension     SURGICAL HISTORY: Past Surgical History:  Procedure Laterality Date  . BILIARY BRUSHING  08/22/2020   Procedure: BILIARY BRUSHING;  Surgeon: Clarene Essex, MD;  Location: [facility];  Service: Endoscopy;;  . BILIARY STENT PLACEMENT N/A 08/22/2020   Procedure: BILIARY STENT PLACEMENT;  Surgeon: Clarene Essex, MD;  Location: [facility];  Service: Endoscopy;  Laterality: N/A;  . ENDOSCOPIC RETROGRADE CHOLANGIOPANCREATOGRAPHY (ERCP) WITH PROPOFOL N/A 08/22/2020   Procedure: ENDOSCOPIC RETROGRADE CHOLANGIOPANCREATOGRAPHY (ERCP) WITH PROPOFOL;  Surgeon: Clarene Essex, MD;  Location: [facility];  Service: Endoscopy;  Laterality: N/A;  . ESOPHAGOGASTRODUODENOSCOPY (EGD) WITH PROPOFOL N/A 08/24/2020   Procedure: ESOPHAGOGASTRODUODENOSCOPY (EGD) WITH PROPOFOL;  Surgeon: Arta Silence, MD;  Location: [facility];  Service: Endoscopy;  Laterality: N/A;  . FINE NEEDLE ASPIRATION N/A 08/24/2020   Procedure: FINE NEEDLE ASPIRATION (FNA) LINEAR;  Surgeon: Arta Silence, MD;  Location: [facility];  Service: Endoscopy;  Laterality: N/A;  . PORTACATH PLACEMENT Right 09/08/2020   Procedure: INSERTION  PORT-A-CATH;  Surgeon: Dwan Bolt, MD;  Location: [place];  Service: General;  Laterality: Right;  . SPHINCTEROTOMY  08/22/2020   Procedure: SPHINCTEROTOMY;  Surgeon: Clarene Essex, MD;  Location: [facility];  Service: Endoscopy;;  . UPPER ESOPHAGEAL ENDOSCOPIC ULTRASOUND (EUS) N/A 08/24/2020   Procedure: UPPER ESOPHAGEAL ENDOSCOPIC ULTRASOUND (EUS);  Surgeon: Arta Silence, MD;  Location: [facility];  Service: Endoscopy;  Laterality: N/A;    I have reviewed the social history and family history with the patient and they are unchanged from previous note.  ALLERGIES:  is allergic to oxycodone-acetaminophen, poison ivy extract, and tyloxapol.  MEDICATIONS:  Current Outpatient Medications  Medication Sig Dispense Refill  . acetaminophen (TYLENOL) 325 MG tablet Take 650 mg by mouth every 6 (six) hours as needed.    . citalopram (CELEXA) 40 MG tablet Take 10 mg by mouth daily.     . cloNIDine (CATAPRES) 0.1 MG tablet Take 0.1 mg by mouth at bedtime.    . hydrochlorothiazide (HYDRODIURIL) 12.5 MG tablet Take 12.5 mg by mouth daily.    Marland Kitchen lidocaine-prilocaine (EMLA) cream Apply to affected area once 30 g 3  . omeprazole (PRILOSEC) 20 MG capsule Take 20 mg by mouth daily.    . ondansetron (ZOFRAN) 8 MG tablet Take 1 tablet (8 mg total) by mouth 2 (two) times daily as needed. Start on day 3 after chemotherapy. 30 tablet 1  . pegfilgrastim-bmez (ZIEXTENZO) 6 MG/0.6ML injection  Inject 6 mg into the skin every 14 (fourteen) days.    . potassium chloride (KLOR-CON) 10 MEQ tablet Take 1 tablet (10 mEq total) by mouth 2 (two) times daily. 60 tablet 1  . prochlorperazine (COMPAZINE) 10 MG tablet Take 1 tablet (10 mg total) by mouth every 6 (six) hours as needed (Nausea or vomiting). 30 tablet 1  . simvastatin (ZOCOR) 80 MG tablet Take 80 mg by mouth daily.     No current facility-administered medications for this visit.   Facility-Administered Medications Ordered in Other Visits   Medication Dose Route Frequency Provider Last Rate Last Admin  . fluorouracil (ADRUCIL) 4,200 mg in sodium chloride 0.9 % 66 mL chemo infusion  2,400 mg/m2 (Treatment Plan Recorded) Intravenous 1 day or 1 dose Truitt Merle, MD   4,200 mg at 10/10/20 1552  . heparin lock flush 100 unit/mL  500 Units Intracatheter Once PRN Truitt Merle, MD      . sodium chloride flush (NS) 0.9 % injection 10 mL  10 mL Intracatheter PRN Truitt Merle, MD        PHYSICAL EXAMINATION: ECOG PERFORMANCE STATUS: 2 - Symptomatic, <50% confined to bed  Vitals:   10/10/20 0906  BP: (!) 153/78  Pulse: 88  Resp: 18  Temp: 98.5 F (36.9 C)  SpO2: 98%   Filed Weights   10/10/20 0906  Weight: 149 lb 1.6 oz (67.6 kg)    Due to COVID19 we will limit examination to appearance. Patient had no complaints.  GENERAL:alert, no distress and comfortable SKIN: skin color normal, no rashes or significant lesions EYES: normal, Conjunctiva are pink and non-injected, sclera clear  NEURO: alert & oriented x 3 with fluent speech    LABORATORY DATA:  I have reviewed the data as listed CBC Latest Ref Rng & Units 10/10/2020 09/26/2020 09/19/2020  WBC 4.0 - 10.5 K/uL 31.7(H) 17.4(H) 2.3(L)  Hemoglobin 12.0 - 15.0 g/dL 11.4(L) 10.6(L) 11.7(L)  Hematocrit 36 - 46 % 35.8(L) 32.1(L) 35.6(L)  Platelets 150 - 400 K/uL 256 398 86(L)     CMP Latest Ref Rng & Units 10/10/2020 09/26/2020 09/19/2020  Glucose 70 - 99 mg/dL 186(H) 179(H) 122(H)  BUN 6 - 20 mg/dL _0 Creatinine 0.44 - 1.00 mg/dL 0.94 0.84 0.78  Sodium 135 - 145 mmol/L 139 139 134(L)  Potassium 3.5 - 5.1 mmol/L 3.0(L) 3.0(L) 4.0  Chloride 98 - 111 mmol/L 106 104 100  CO2 22 - 32 mmol/L 20(L) 24 27  Calcium 8.9 - 10.3 mg/dL 8.7(L) 8.6(L) 9.0  Total Protein 6.5 - 8.1 g/dL 6.6 6.5 6.8  Total Bilirubin 0.3 - 1.2 mg/dL 0.4 0.6 1.5(H)  Alkaline Phos 38 - 126 U/L 323(H) 1,044(H) 854(H)  AST 15 - 41 U/L 39 56(H) 155(H)  ALT 0 - 44 U/L 24 113(H) 214(H)      RADIOGRAPHIC  STUDIES: I have personally reviewed the radiological images as listed and agreed with the findings in the report. No results found.   ASSESSMENT & PLAN:  Jacqueline Tyler is a 59 y.o. female with    1. 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 CT did not show other malignancy.   -She was seen bysurgeon Dr Zenia Resides on 09/06/20, neoadjuvant chemo recommended -Due to the borderline resectable disease, Istarted her onneoadjuvant chemotherapy withFOLFIRINOX  q2weekson 10/25/21to downstage her borderline resectable disease for3-56month before surgery with possible additional adjuvant chemoafter surgery. Will scan her after 6 cycles of treatment to monitor her response. -S/p C2 she had more diarrhea. I reviewed imodium use with her. Labs reviewed, WBC 31.7, Hg 11.4, ANC 19.4, K 3, BG 186, CA 8/7, albumin 3.4, Alk Phos 323. Labs overall adequate to proceed with C3 FOLFIRINOX. May give GCSF every other cycle, given elevated WBC and ANC.  -F/u in 2 weeks    2. Diarrhea, Weight loss, Hypokalemia -S/p C2 she has had loose BM twice a day which has lead to a BM accident.  -She has imodium. I encouraged her to use 1-2 tabs in the Morning and continue as needed. If she has constipation she would back off dose. If she needs stronger aid, I can call in Lomotil.  -Her weight is trending down on chemo. I encouraged her to start national supplement like Glucerna daily. -K at 3 today (10/10/20), likely from recent diarrhea. Will Increase oral potassium to BID.    3. Obstructive Jaundice, Transaminitis, Hyperbilirubinuria, Secondary to #1  -Her initial 08/20/20 labs showed AST 349, ALT 473, alk phos 903 and tbili  7.3. She only had Jaundice of skin and eyes. Otherwise asymptomatic.  -Plastic Stent placed by ERCP on 08/22/20 with Dr MWatt Climes May need stent exchange in the future. -Jaundice resolved, hyperbilirubinemia and transaminitis resolved except elevated Alk Phos.   4. HTN, Hyperglycemia  -During recent hospitalization for pancreatic cancer her 08/20/20 A1c was 7.1  -On Zocor, Clonidine and HCTZ for HTN.HCTZ held from start of chemo due to hypotension.  -Continue to F/u with PCP -I recommend she reduce carbohydrate and sugar intake for first week of treatment. May start her on medication soon.   5. Financial assistance and Social support, Depression -She notes concern about continuing work when she can for money. She works for [employer] home. Her husband recently retired but may return to work in near future.  -I encouraged her to consult with financial advocate or our financial office for assistance. -She was not approved to work part time. I discussed if her job allows she is medically fine to work every other week. -She is on Celexa 128mfor her depression.She did feel depressed being sick during her first cycle chemo. I recommend maintain a positive mindset.   PLAN: -Labs reviewed and adequate to proceed with C2 FOLFIRINOX today at same dose. Will hold GCSF this cycle (she does self injection) -Labs, flush, F/u and FOLFIRINOX in 2 and 4 weeks, may consider dose reduction next cycle if she has persistent diarrhea or worsening fatigue    No problem-specific Assessment & Plan notes found for this encounter.   No orders of the defined types were placed in this encounter.  All questions were answered. The patient knows to call the clinic with any problems, questions or concerns. No barriers to learning was detected. The total time spent in the appointment was 30 minutes.     YaTruitt MerleMD 10/10/2020   I, AmJoslyn Devonam acting as scribe for YaTruitt MerleMD.   I have  reviewed the above documentation for accuracy and completeness, and I agree with the above.

## 2020-10-07 NOTE — Progress Notes (Signed)
Patient called to advise of income for grant qualification. She advised she will email.  Will meet with patient 10/12/20 to go over grant and obtain signature.  She has my card for any additional financial questions or concerns.

## 2020-10-09 ENCOUNTER — Other Ambulatory Visit: Payer: Self-pay | Admitting: Hematology

## 2020-10-10 ENCOUNTER — Other Ambulatory Visit: Payer: Self-pay

## 2020-10-10 ENCOUNTER — Inpatient Hospital Stay: Payer: 59

## 2020-10-10 ENCOUNTER — Encounter: Payer: Self-pay | Admitting: Hematology

## 2020-10-10 ENCOUNTER — Inpatient Hospital Stay (HOSPITAL_BASED_OUTPATIENT_CLINIC_OR_DEPARTMENT_OTHER): Payer: 59 | Admitting: Hematology

## 2020-10-10 VITALS — BP 153/78 | HR 88 | Temp 98.5°F | Resp 18 | Ht 62.0 in | Wt 149.1 lb

## 2020-10-10 VITALS — BP 167/82 | HR 75 | Resp 16

## 2020-10-10 DIAGNOSIS — C250 Malignant neoplasm of head of pancreas: Secondary | ICD-10-CM

## 2020-10-10 DIAGNOSIS — Z95828 Presence of other vascular implants and grafts: Secondary | ICD-10-CM

## 2020-10-10 LAB — CMP (CANCER CENTER ONLY)
ALT: 24 U/L (ref 0–44)
AST: 39 U/L (ref 15–41)
Albumin: 3.4 g/dL — ABNORMAL LOW (ref 3.5–5.0)
Alkaline Phosphatase: 323 U/L — ABNORMAL HIGH (ref 38–126)
Anion gap: 13 (ref 5–15)
BUN: 9 mg/dL (ref 6–20)
CO2: 20 mmol/L — ABNORMAL LOW (ref 22–32)
Calcium: 8.7 mg/dL — ABNORMAL LOW (ref 8.9–10.3)
Chloride: 106 mmol/L (ref 98–111)
Creatinine: 0.94 mg/dL (ref 0.44–1.00)
GFR, Estimated: >60 mL/min (ref >60)
Glucose, Bld: 186 mg/dL — ABNORMAL HIGH (ref 70–99)
Potassium: 3 mmol/L — ABNORMAL LOW (ref 3.5–5.1)
Sodium: 139 mmol/L (ref 135–145)
Total Bilirubin: 0.4 mg/dL (ref 0.3–1.2)
Total Protein: 6.6 g/dL (ref 6.5–8.1)

## 2020-10-10 LAB — CBC WITH DIFFERENTIAL (CANCER CENTER ONLY)
Abs Immature Granulocytes: 6.39 10*3/uL — ABNORMAL HIGH (ref 0.00–0.07)
Basophils Absolute: 0.1 10*3/uL (ref 0.0–0.1)
Basophils Relative: 0 %
Eosinophils Absolute: 0 10*3/uL (ref 0.0–0.5)
Eosinophils Relative: 0 %
HCT: 35.8 % — ABNORMAL LOW (ref 36.0–46.0)
Hemoglobin: 11.4 g/dL — ABNORMAL LOW (ref 12.0–15.0)
Immature Granulocytes: 20 %
Lymphocytes Relative: 11 %
Lymphs Abs: 3.5 10*3/uL (ref 0.7–4.0)
MCH: 27.1 pg (ref 26.0–34.0)
MCHC: 31.8 g/dL (ref 30.0–36.0)
MCV: 85 fL (ref 80.0–100.0)
Monocytes Absolute: 2.4 10*3/uL — ABNORMAL HIGH (ref 0.1–1.0)
Monocytes Relative: 8 %
Neutro Abs: 19.4 10*3/uL — ABNORMAL HIGH (ref 1.7–7.7)
Neutrophils Relative %: 61 %
Platelet Count: 256 10*3/uL (ref 150–400)
RBC: 4.21 MIL/uL (ref 3.87–5.11)
RDW: 16.1 % — ABNORMAL HIGH (ref 11.5–15.5)
WBC Count: 31.7 10*3/uL — ABNORMAL HIGH (ref 4.0–10.5)
nRBC: 0.7 % — ABNORMAL HIGH (ref 0.0–0.2)

## 2020-10-10 LAB — GLUCOSE, CAPILLARY: Glucose-Capillary: 191 mg/dL — ABNORMAL HIGH (ref 70–99)

## 2020-10-10 MED ORDER — SODIUM CHLORIDE 0.9% FLUSH
10.0000 mL | INTRAVENOUS | Status: DC | PRN
  Filled 2020-10-10: qty 10

## 2020-10-10 MED ORDER — SODIUM CHLORIDE 0.9 % IV SOLN
16.0000 mg | Freq: Once | INTRAVENOUS | Status: AC
  Administered 2020-10-10: 16 mg via INTRAVENOUS
  Filled 2020-10-10: qty 8

## 2020-10-10 MED ORDER — DEXAMETHASONE SODIUM PHOSPHATE 10 MG/ML IJ SOLN
INTRAMUSCULAR | Status: AC
  Filled 2020-10-10: qty 1

## 2020-10-10 MED ORDER — SODIUM CHLORIDE 0.9 % IV SOLN
150.0000 mg/m2 | Freq: Once | INTRAVENOUS | Status: AC
  Administered 2020-10-10: 260 mg via INTRAVENOUS
  Filled 2020-10-10: qty 13

## 2020-10-10 MED ORDER — HEPARIN SOD (PORK) LOCK FLUSH 100 UNIT/ML IV SOLN
500.0000 [IU] | Freq: Once | INTRAVENOUS | Status: DC | PRN
  Filled 2020-10-10: qty 5

## 2020-10-10 MED ORDER — SODIUM CHLORIDE 0.9 % IV SOLN
2400.0000 mg/m2 | INTRAVENOUS | Status: DC
  Administered 2020-10-10: 4200 mg via INTRAVENOUS
  Filled 2020-10-10: qty 84

## 2020-10-10 MED ORDER — SODIUM CHLORIDE 0.9 % IV SOLN: 8.0000 mg | Freq: Once | INTRAVENOUS | Status: DC

## 2020-10-10 MED ORDER — SODIUM CHLORIDE 0.9 % IV SOLN
400.0000 mg/m2 | Freq: Once | INTRAVENOUS | Status: AC
  Administered 2020-10-10: 704 mg via INTRAVENOUS
  Filled 2020-10-10: qty 35.2

## 2020-10-10 MED ORDER — ATROPINE SULFATE 1 MG/ML IJ SOLN
INTRAMUSCULAR | Status: AC
  Filled 2020-10-10: qty 1

## 2020-10-10 MED ORDER — SODIUM CHLORIDE 0.9 % IV SOLN
150.0000 mg | Freq: Once | INTRAVENOUS | Status: AC
  Administered 2020-10-10: 150 mg via INTRAVENOUS
  Filled 2020-10-10: qty 150

## 2020-10-10 MED ORDER — OXALIPLATIN CHEMO INJECTION 100 MG/20ML
85.0000 mg/m2 | Freq: Once | INTRAVENOUS | Status: AC
  Administered 2020-10-10: 150 mg via INTRAVENOUS
  Filled 2020-10-10: qty 30

## 2020-10-10 MED ORDER — SODIUM CHLORIDE 0.9% FLUSH
10.0000 mL | INTRAVENOUS | Status: DC | PRN
  Administered 2020-10-10: 10 mL
  Filled 2020-10-10: qty 10

## 2020-10-10 MED ORDER — DEXTROSE 5 % IV SOLN
Freq: Once | INTRAVENOUS | Status: AC
  Filled 2020-10-10: qty 250

## 2020-10-10 MED ORDER — POTASSIUM CHLORIDE ER 10 MEQ PO TBCR
10.0000 meq | EXTENDED_RELEASE_TABLET | Freq: Two times a day (BID) | ORAL | 1 refills | Status: DC
Start: 2020-10-10 — End: 2020-12-08

## 2020-10-10 MED ORDER — ATROPINE SULFATE 1 MG/ML IJ SOLN
0.5000 mg | Freq: Once | INTRAMUSCULAR | Status: AC | PRN
  Administered 2020-10-10: 0.5 mg via INTRAVENOUS

## 2020-10-10 MED ORDER — DEXAMETHASONE SODIUM PHOSPHATE 10 MG/ML IJ SOLN
8.0000 mg | Freq: Once | INTRAMUSCULAR | Status: AC
  Administered 2020-10-10: 8 mg via INTRAVENOUS

## 2020-10-10 NOTE — Progress Notes (Signed)
At the completion of oxaliplatin infusion, patient c/o dizziness and unusual sensation in her throat. Vital signs near baseline from earlier today. CBG 191 mg/dL. Warm beverage provided and encouraged patient to put her face mask on to facilitate rebreathing of warm, moist air. Patient verbalized quick near-complete resolution of symptoms. Near the end of irinotecan/lcv, patient began to c/o bilateral lower leg weakness/tremors when standing. Sandi Mealy, PA-C aware and agrees these complaints are typical with FOLFIRINOX regimen. Explained this to patient who verbalized understanding. Encouraged patient to use extreme caution when ambulating while symptoms present. Again, verbalized understanding. Patient escorted to POV via [place]. Family assisted patient into vehicle with no further incident.

## 2020-10-10 NOTE — Patient Instructions (Signed)
[facility] Discharge Instructions for Patients Receiving Chemotherapy  Today you received the following chemotherapy agents oxaliplatin, leucovorin, irinotecan, fluorourcil.   To help prevent nausea and vomiting after your treatment, we encourage you to take your nausea medication as directed.   If you develop nausea and vomiting that is not controlled by your nausea medication, call the clinic.   BELOW ARE SYMPTOMS THAT SHOULD BE REPORTED IMMEDIATELY:  *FEVER GREATER THAN 100.5 F  *CHILLS WITH OR WITHOUT FEVER  NAUSEA AND VOMITING THAT IS NOT CONTROLLED WITH YOUR NAUSEA MEDICATION  *UNUSUAL SHORTNESS OF BREATH  *UNUSUAL BRUISING OR BLEEDING  TENDERNESS IN MOUTH AND THROAT WITH OR WITHOUT PRESENCE OF ULCERS  *URINARY PROBLEMS  *BOWEL PROBLEMS  UNUSUAL RASH Items with * indicate a potential emergency and should be followed up as soon as possible.  Feel free to call the clinic should you have any questions or concerns. The clinic phone number is [phone].  Please show the [place] at check-in to the Emergency Department and triage nurse.

## 2020-10-11 ENCOUNTER — Telehealth: Payer: Self-pay | Admitting: *Deleted

## 2020-10-11 NOTE — Telephone Encounter (Signed)
Connected with Jacqueline Tyler to confirm full-time return to work date (RTW) requested per [employer] [facility] claim # [id number] is 10/17/2020 and other steps or needs necessary per her supervisor and HR.  "I can't do that.  I need to continue my continuous work leave through 11/18/2020, then return intermittently.  Not putting this on you but we previously discussed chemotherapy continues through end of year  You're on speaker while I check emails.  Nothing new since 10/04/2020 HR message in reference to [place] by [place].  I signed up for LTD with [employer] but did not under [employer]."  Form to provider with 11-18-2020 RTW day, part time with intermittent FMLA.     Initial request for "intermittent FMLA; I work from home."  Second request to change to continuous leave.  "Form completed incorrectly,  I did not understand the treatment time needed.  I have not worked since ERCP procedure 08/22/2020.  Third request, 09/30/2020 call from patient reporting "They expect me to return to work 10/17/2020 or I loose benefits 11/18/2020.  I need benefits and pay.  [place] has faxed request.  Unable to work the week I receive treatment but may be able to work a few hours a day the off treatment week.  Trying repeatedly but unable to reach HR to determine how many hours of work are needed to maintain benefits ."    Advised notify supervisor, [place] or [employer]. officer of expected chemotherapy end date (12/21/2020) to discuss next steps to receive appropriate provider certification supporting time needed for medical leave.  May qualify for short or long term disability, FMLA renewal, ADA Accommodation."

## 2020-10-12 ENCOUNTER — Other Ambulatory Visit: Payer: Self-pay

## 2020-10-12 ENCOUNTER — Telehealth: Payer: Self-pay | Admitting: Hematology

## 2020-10-12 ENCOUNTER — Encounter: Payer: Self-pay | Admitting: Hematology

## 2020-10-12 ENCOUNTER — Inpatient Hospital Stay: Payer: 59

## 2020-10-12 VITALS — BP 176/91 | HR 75 | Resp 18

## 2020-10-12 DIAGNOSIS — C250 Malignant neoplasm of head of pancreas: Secondary | ICD-10-CM

## 2020-10-12 MED ORDER — HEPARIN SOD (PORK) LOCK FLUSH 100 UNIT/ML IV SOLN
500.0000 [IU] | Freq: Once | INTRAVENOUS | Status: AC | PRN
  Administered 2020-10-12: 500 [IU]
  Filled 2020-10-12: qty 5

## 2020-10-12 MED ORDER — SODIUM CHLORIDE 0.9% FLUSH
10.0000 mL | INTRAVENOUS | Status: DC | PRN
  Administered 2020-10-12: 10 mL
  Filled 2020-10-12: qty 10

## 2020-10-12 NOTE — Progress Notes (Signed)
Met with patient at registration to obtain signature for [employer].  Patient approved for one-time $1000 [employer] grant to assist with personal expenses while going through treatment. Gave her a copy of the approval letter and expense sheet along with the Outpatient pharmacy information. She received a gift card today from grant.  She has my card for any additional financial questions or concerns.

## 2020-10-12 NOTE — Telephone Encounter (Signed)
No 11/22 los. No changes made to pt's schedule.

## 2020-10-21 NOTE — Progress Notes (Signed)
[place]   [phone] [phone]   Clinic Follow up Note   Patient Care Team: Nicola Girt, DO as PCP - General (Internal Medicine) Jonnie Finner, RN as Oncology Nurse Navigator Truitt Merle, MD as Consulting Physician (Oncology) Dwan Bolt, MD as Consulting Physician (General Surgery)  Date of Service:  10/24/2020  CHIEF COMPLAINT: F/u of pancreatic cancer  SUMMARY OF ONCOLOGIC HISTORY: Oncology History Overview Note  Cancer Staging Pancreatic cancer [facility]) Staging form: Exocrine Pancreas, AJCC 8th Edition - Clinical stage from 08/24/2020: Stage IIB (cT2, cN1, cM0) - Signed by Truitt Merle, MD on 08/30/2020    Pancreatic cancer ([place])  08/20/2020 Imaging   CT AP 08/20/20  IMPRESSION: 1. 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 Collateral circulation is noted. There is moderate intrahepatic and extrahepatic biliary dilatation which appears to be due to the pancreatic head mass. Pancreatic ductal dilatation is noted as well. 2. Probable 2.7 cm uterine fibroid. 3. Aortic atherosclerosis.   Aortic Atherosclerosis (ICD10-I70.0).   08/20/2020 Tumor Marker   Ca19-9 - 927   08/22/2020 Procedure   ERCP by Dr Watt Climes 08/22/20  IMPRESSION - The major papilla appeared normal. - A biliary sphincterotomy was performed. - Cells for cytology obtained in the lower third and middle of the main duct. - One plastic stent was placed into the common bile duct.   FINAL MICROSCOPIC DIAGNOSIS:  - No malignant cells identified  - Benign reactive/reparative changes   08/24/2020 Cancer Staging   Staging form: Exocrine Pancreas, AJCC 8th Edition - Clinical stage from 08/24/2020: Stage IIB (cT2, cN1, cM0) - Signed by Truitt Merle, MD on 08/30/2020   08/24/2020 Procedure    EUS by Dr Paulita Fujita 08/24/20  IMPRESSION - There was no sign of significant pathology in the ampulla. - A few malignant-appearing lymph nodes were visualized in the peripancreatic region and porta hepatis region. - One stent was visualized endosonographically in the common bile duct. - A mass was identified in the pancreatic head. Tissue was obtained from this exam. The preliminary diagnosis is consistent with adenocarcinoma. Invasion into SMV/PV seen. Lymphadenopathy noted. This was staged T3 N1 Mx by endosonographic criteria. Fine needle aspiration performed.   08/24/2020 Initial Biopsy   FINAL MICROSCOPIC DIAGNOSIS: 08/24/20 - Malignant cells consistent with adenocarcinoma    08/30/2020 Initial Diagnosis   Pancreatic cancer ([place])   09/05/2020 Imaging   CT Chest  IMPRESSION: 1. Stable 2.7 cm infiltrating pancreatic head mass with borderline enlarged peripancreatic lymph nodes. 2. No findings for pulmonary metastatic disease. 3. Small hiatal hernia. 4. Aortic atherosclerosis.   Aortic Atherosclerosis (ICD10-I70.0).   09/08/2020 Procedure   INSERTION PORT-A-CATH by Dr Zenia Resides and Barry Dienes    09/12/2020 -  Chemotherapy   Neoadjuvant FOLFIRINOX q2weeks starting 09/12/20       CURRENT THERAPY:  Neoadjuvant FOLFIRINOX q2 weeks starting 09/12/20  INTERVAL HISTORY:  Jacqueline Tyler is here for a follow up. She presents to the clinic alone. She notes she did not tolerate cycle 3 chemo well. She notes she had struggles getting herself to calm down on Thanksgiving. She took an antiemetic which calmed her down. She notes feeling anxious. She is on Celexa $RemoveB'10mg'sRxPzUco$ . She used Xanax in the past but did not like taking it. She notes during C3 infusion she felt  shaky and unsteady on her legs and tightness of the throat. She notes by the evening at home she felt better. She feels this is related to chemo.  She plans to apply for long term disability so she can have income while not being able to work.       REVIEW OF SYSTEMS:   Constitutional: Denies fevers, chills or abnormal weight loss Eyes: Denies blurriness of vision Ears, nose, mouth, throat, and face: Denies mucositis or sore throat Respiratory: Denies cough, dyspnea or wheezes Cardiovascular: Denies palpitation, chest discomfort or lower extremity swelling Gastrointestinal:  Denies nausea, heartburn or change in bowel habits Skin: Denies abnormal skin rashes Lymphatics: Denies new lymphadenopathy or easy bruising Neurological:Denies numbness, tingling or new weaknesses Behavioral/Psych: Mood is stable, no new changes (+) Anxiety All other systems were reviewed with the patient and are negative.  MEDICAL HISTORY:  Past Medical History:  Diagnosis Date  . Anxiety   . Cancer ([place]) 07/2020   pancreatic cancer  . Depression   . Diabetes mellitus without complication ([place]) 35/5732   pancreatic cancer  . High cholesterol   . Hypertension     SURGICAL HISTORY: Past Surgical History:  Procedure Laterality Date  . BILIARY BRUSHING  08/22/2020   Procedure: BILIARY BRUSHING;  Surgeon: Clarene Essex, MD;  Location: [facility];  Service: Endoscopy;;  . BILIARY STENT PLACEMENT N/A 08/22/2020   Procedure: BILIARY STENT PLACEMENT;  Surgeon: Clarene Essex, MD;  Location: [facility];  Service: Endoscopy;  Laterality: N/A;  . ENDOSCOPIC RETROGRADE CHOLANGIOPANCREATOGRAPHY (ERCP) WITH PROPOFOL N/A 08/22/2020   Procedure: ENDOSCOPIC RETROGRADE CHOLANGIOPANCREATOGRAPHY (ERCP) WITH PROPOFOL;  Surgeon: Clarene Essex, MD;  Location: [facility];  Service: Endoscopy;  Laterality: N/A;  . ESOPHAGOGASTRODUODENOSCOPY (EGD) WITH PROPOFOL N/A 08/24/2020   Procedure: ESOPHAGOGASTRODUODENOSCOPY (EGD) WITH PROPOFOL;  Surgeon: Arta Silence, MD;  Location: [facility];  Service: Endoscopy;  Laterality: N/A;  . FINE NEEDLE ASPIRATION N/A 08/24/2020   Procedure: FINE NEEDLE ASPIRATION (FNA) LINEAR;  Surgeon: Arta Silence, MD;  Location: [facility];   Service: Endoscopy;  Laterality: N/A;  . PORTACATH PLACEMENT Right 09/08/2020   Procedure: INSERTION PORT-A-CATH;  Surgeon: Dwan Bolt, MD;  Location: [facility];  Service: General;  Laterality: Right;  . SPHINCTEROTOMY  08/22/2020   Procedure: SPHINCTEROTOMY;  Surgeon: Clarene Essex, MD;  Location: [facility];  Service: Endoscopy;;  . UPPER ESOPHAGEAL ENDOSCOPIC ULTRASOUND (EUS) N/A 08/24/2020   Procedure: UPPER ESOPHAGEAL ENDOSCOPIC ULTRASOUND (EUS);  Surgeon: Arta Silence, MD;  Location: [facility];  Service: Endoscopy;  Laterality: N/A;    I have reviewed the social history and family history with the patient and they are unchanged from previous note.  ALLERGIES:  is allergic to oxycodone-acetaminophen, poison ivy extract, and tyloxapol.  MEDICATIONS:  Current Outpatient Medications  Medication Sig Dispense Refill  . acetaminophen (TYLENOL) 325 MG tablet Take 650 mg by mouth every 6 (six) hours as needed.    . citalopram (CELEXA) 40 MG tablet Take 10 mg by mouth daily.     . cloNIDine (CATAPRES) 0.1 MG tablet Take 0.1 mg by mouth at bedtime.    . hydrochlorothiazide (HYDRODIURIL) 12.5 MG tablet Take 12.5 mg by mouth daily.    Marland Kitchen lidocaine-prilocaine (EMLA) cream Apply to affected area once 30 g 3  . omeprazole (PRILOSEC) 20 MG capsule Take 20 mg by mouth daily.    . ondansetron (ZOFRAN) 8 MG tablet Take 1 tablet (8 mg total) by mouth 2 (two) times daily as needed. Start on day 3 after  chemotherapy. 30 tablet 1  . pegfilgrastim-bmez (ZIEXTENZO) 6 MG/0.6ML injection Inject 6 mg into the skin every 14 (fourteen) days.    . potassium chloride (KLOR-CON) 10 MEQ tablet Take 1 tablet (10 mEq total) by mouth 2 (two) times daily. 60 tablet 1  . prochlorperazine (COMPAZINE) 10 MG tablet Take 1 tablet (10 mg total) by mouth every 6 (six) hours as needed (Nausea or vomiting). 30 tablet 1  . simvastatin (ZOCOR) 80 MG tablet Take 80 mg by mouth daily.     No current  facility-administered medications for this visit.   Facility-Administered Medications Ordered in Other Visits  Medication Dose Route Frequency Provider Last Rate Last Admin  . dexamethasone (DECADRON) injection 8 mg  8 mg Intravenous Once Truitt Merle, MD      . dextrose 5 % solution   Intravenous Once Truitt Merle, MD      . fluorouracil (ADRUCIL) 4,200 mg in sodium chloride 0.9 % 66 mL chemo infusion  2,400 mg/m2 (Treatment Plan Recorded) Intravenous 1 day or 1 dose Truitt Merle, MD      . fosaprepitant (EMEND) 150 mg in sodium chloride 0.9 % 145 mL IVPB  150 mg Intravenous Once Truitt Merle, MD      . heparin lock flush 100 unit/mL  500 Units Intracatheter Once PRN Truitt Merle, MD      . irinotecan (CAMPTOSAR) 260 mg in sodium chloride 0.9 % 500 mL chemo infusion  150 mg/m2 (Treatment Plan Recorded) Intravenous Once Truitt Merle, MD      . leucovorin 704 mg in sodium chloride 0.9 % 250 mL infusion  400 mg/m2 (Treatment Plan Recorded) Intravenous Once Truitt Merle, MD      . ondansetron (ZOFRAN) 16 mg in sodium chloride 0.9 % 50 mL IVPB  16 mg Intravenous Once Truitt Merle, MD      . oxaliplatin (ELOXATIN) 150 mg in dextrose 5 % 500 mL chemo infusion  85 mg/m2 (Treatment Plan Recorded) Intravenous Once Truitt Merle, MD      . sodium chloride flush (NS) 0.9 % injection 10 mL  10 mL Intracatheter PRN Truitt Merle, MD        PHYSICAL EXAMINATION: ECOG PERFORMANCE STATUS: 1 - Symptomatic but completely ambulatory  Vitals:   10/24/20 0857  BP: 138/77  Pulse: 61  Resp: 15  Temp: (!) 97.2 F (36.2 C)  SpO2: 100%   Filed Weights   10/24/20 0857  Weight: 147 lb 1.6 oz (66.7 kg)    Due to COVID19 we will limit examination to appearance. Patient had no complaints.  GENERAL:alert, no distress and comfortable SKIN: skin color normal, no rashes or significant lesions EYES: normal, Conjunctiva are pink and non-injected, sclera clear  NEURO: alert & oriented x 3 with fluent speech   LABORATORY DATA:  I have reviewed  the data as listed CBC Latest Ref Rng & Units 10/24/2020 10/10/2020 09/26/2020  WBC 4.0 - 10.5 K/uL 3.1(L) 31.7(H) 17.4(H)  Hemoglobin 12.0 - 15.0 g/dL 10.9(L) 11.4(L) 10.6(L)  Hematocrit 36 - 46 % 32.5(L) 35.8(L) 32.1(L)  Platelets 150 - 400 K/uL 136(L) 256 398     CMP Latest Ref Rng & Units 10/24/2020 10/10/2020 09/26/2020  Glucose 70 - 99 mg/dL 101(H) 186(H) 179(H)  BUN 6 - 20 mg/dL $Remove'6 9 6  'DhcXURy$ Creatinine 0.44 - 1.00 mg/dL 0.73 0.94 0.84  Sodium 135 - 145 mmol/L 141 139 139  Potassium 3.5 - 5.1 mmol/L 3.3(L) 3.0(L) 3.0(L)  Chloride 98 - 111 mmol/L 108 106 104  CO2  22 - 32 mmol/L 25 20(L) 24  Calcium 8.9 - 10.3 mg/dL 8.8(L) 8.7(L) 8.6(L)  Total Protein 6.5 - 8.1 g/dL 6.1(L) 6.6 6.5  Total Bilirubin 0.3 - 1.2 mg/dL 0.5 0.4 0.6  Alkaline Phos 38 - 126 U/L 162(H) 323(H) 1,044(H)  AST 15 - 41 U/L 35 39 56(H)  ALT 0 - 44 U/L 34 24 113(H)      RADIOGRAPHIC STUDIES: I have personally reviewed the radiological images as listed and agreed with the findings in the report. No results found.   ASSESSMENT & PLAN:  Jacqueline Tyler is a 59 y.o. female with    1. 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 CT did not show other malignancy.   -She was seen bysurgeon Dr Zenia Resides on 09/06/20,neoadjuvantchemo recommended -Due to the borderline resectable disease, Istarted her onneoadjuvant chemotherapy withFOLFIRINOX q2weekson 10/25/21to downstage her borderline resectable disease for3-80months before surgery with possible additional adjuvant chemoafter surgery. Will scan her after 6 cycles oftreatment to monitor her response. -S/p C3 she tolerated fairly well. She had an episode of increased  anxiety. She also notes experiencing leg shakiness, weakness with irinotecan infusions and throat tightness with oxaliplatin for the past 2 infusions. Will slow down infusion rate and give more steroid in premedication. Will use wheelchair after infusion.  -Labs reviewed, WBC 3.1, Hg 10.9, plt 136K, ANC 1.1. Overall adequate to proceed with C4 FOLFIRINOX today. -She will proceed with GCSF at home injection on day 3 given neutropenia. Continue with each cycle.  -F/u in 2 weeks    2. Diarrhea, Weight loss, Hypokalemia -S/p C2 she has had loose BM twice a day which has lead to a BM accident.  -She has imodium. I encouraged her to use 1-2 tabs in the Morning and continue as needed. If she has constipation she would back off dose. If she needs stronger aid, I can call in L[place]otil.  -Her weight is trending down on chemo. I encouraged her to start national supplement like Glucerna daily. -Continue oral potassium BID.    3. Obstructive Jaundice, Transaminitis, Hyperbilirubinuria,Secondary to #1  -Her initial 08/20/20 labs showed AST 349, ALT 473, alk phos 903 and tbili 7.3. She only had Jaundice of skin and eyes. Otherwise asymptomatic.  -Plastic Stent placed by ERCP on 08/22/20 with Dr Watt Climes. May need stent exchange in the future. -Jaundice resolved, hyperbilirubinemia and transaminitis resolved except elevated Alk Phos.   4. HTN, Hyperglycemia  -During recent hospitalization for pancreatic cancer her 08/20/20 A1c was 7.1  -On Zocor, Clonidine and HCTZ for HTN.HCTZ held from start of chemo due to hypotension.  -Continue to F/u with PCP -I recommend she reduce carbohydrate and sugar intake for first week of treatment. May start her on medication soon.   5. Financial assistance and Social support, Depression -She notes concern about continuing work when she can for money. She works for [employer] from home. Her husband recently retired but may return to work in near future.  -I encouraged  her to consult with financial advocate or our financial office for assistance. -She was not approved to work part time. I discussed if her job allows she is medically fine to work every other week. -She is on Celexa $RemoveB'10mg'sykzEouO$  for her depression.She did feel depressed being sick during her first cycle chemo. I  recommend maintain a positive mindset. -With more C3 treatment she was feeling more anxious. I discussed increased Celexa dose to $Remov'20mg'lqYtnl$  (12/6/1). She is agreeable.   6. Neutropenia -secondary to chemo, she did not have GCSF last cycle chemo -will restart GCSF on day 3 of chemo    PLAN: -Labs reviewed and adequate to proceed with C4 FOLFIRINOX. Proceed with GCSF at home infection on day 3.  -I spoke with her husband on the phone today  -increase celexa to $RemoveB'20mg'KdhvMbOa$  daily  -Labs, flush, F/u and FOLFIRINOX in2 and 4 weeks    No problem-specific Assessment & Plan notes found for this encounter.   No orders of the defined types were placed in this encounter.  All questions were answered. The patient knows to call the clinic with any problems, questions or concerns. No barriers to learning was detected. The total time spent in the appointment was 30 minutes.     Truitt Merle, MD 10/24/2020   I, Joslyn Devon, am acting as scribe for Truitt Merle, MD.   I have reviewed the above documentation for accuracy and completeness, and I agree with the above.

## 2020-10-24 ENCOUNTER — Encounter: Payer: Self-pay | Admitting: Hematology

## 2020-10-24 ENCOUNTER — Inpatient Hospital Stay: Payer: 59 | Attending: Hematology

## 2020-10-24 ENCOUNTER — Inpatient Hospital Stay (HOSPITAL_BASED_OUTPATIENT_CLINIC_OR_DEPARTMENT_OTHER): Payer: 59 | Admitting: Hematology

## 2020-10-24 ENCOUNTER — Inpatient Hospital Stay: Payer: 59

## 2020-10-24 ENCOUNTER — Other Ambulatory Visit: Payer: Self-pay

## 2020-10-24 ENCOUNTER — Other Ambulatory Visit: Payer: Self-pay | Admitting: Medical

## 2020-10-24 VITALS — BP 138/77 | HR 61 | Temp 97.2°F | Resp 15 | Ht 62.0 in | Wt 147.1 lb

## 2020-10-24 DIAGNOSIS — C250 Malignant neoplasm of head of pancreas: Secondary | ICD-10-CM

## 2020-10-24 DIAGNOSIS — Z5111 Encounter for antineoplastic chemotherapy: Secondary | ICD-10-CM | POA: Insufficient documentation

## 2020-10-24 DIAGNOSIS — E876 Hypokalemia: Secondary | ICD-10-CM | POA: Insufficient documentation

## 2020-10-24 DIAGNOSIS — K831 Obstruction of bile duct: Secondary | ICD-10-CM | POA: Diagnosis not present

## 2020-10-24 DIAGNOSIS — C259 Malignant neoplasm of pancreas, unspecified: Secondary | ICD-10-CM

## 2020-10-24 DIAGNOSIS — R110 Nausea: Secondary | ICD-10-CM

## 2020-10-24 DIAGNOSIS — Z79899 Other long term (current) drug therapy: Secondary | ICD-10-CM | POA: Diagnosis not present

## 2020-10-24 DIAGNOSIS — Z95828 Presence of other vascular implants and grafts: Secondary | ICD-10-CM

## 2020-10-24 LAB — CBC WITH DIFFERENTIAL (CANCER CENTER ONLY)
Abs Immature Granulocytes: 0.01 10*3/uL (ref 0.00–0.07)
Basophils Absolute: 0 10*3/uL (ref 0.0–0.1)
Basophils Relative: 0 %
Eosinophils Absolute: 0.1 10*3/uL (ref 0.0–0.5)
Eosinophils Relative: 2 %
HCT: 32.5 % — ABNORMAL LOW (ref 36.0–46.0)
Hemoglobin: 10.9 g/dL — ABNORMAL LOW (ref 12.0–15.0)
Immature Granulocytes: 0 %
Lymphocytes Relative: 45 %
Lymphs Abs: 1.4 10*3/uL (ref 0.7–4.0)
MCH: 27.5 pg (ref 26.0–34.0)
MCHC: 33.5 g/dL (ref 30.0–36.0)
MCV: 82.1 fL (ref 80.0–100.0)
Monocytes Absolute: 0.5 10*3/uL (ref 0.1–1.0)
Monocytes Relative: 17 %
Neutro Abs: 1.1 10*3/uL — ABNORMAL LOW (ref 1.7–7.7)
Neutrophils Relative %: 36 %
Platelet Count: 136 10*3/uL — ABNORMAL LOW (ref 150–400)
RBC: 3.96 MIL/uL (ref 3.87–5.11)
RDW: 16.8 % — ABNORMAL HIGH (ref 11.5–15.5)
WBC Count: 3.1 10*3/uL — ABNORMAL LOW (ref 4.0–10.5)
nRBC: 0 % (ref 0.0–0.2)

## 2020-10-24 LAB — CMP (CANCER CENTER ONLY)
ALT: 34 U/L (ref 0–44)
AST: 35 U/L (ref 15–41)
Albumin: 3.4 g/dL — ABNORMAL LOW (ref 3.5–5.0)
Alkaline Phosphatase: 162 U/L — ABNORMAL HIGH (ref 38–126)
Anion gap: 8 (ref 5–15)
BUN: 6 mg/dL (ref 6–20)
CO2: 25 mmol/L (ref 22–32)
Calcium: 8.8 mg/dL — ABNORMAL LOW (ref 8.9–10.3)
Chloride: 108 mmol/L (ref 98–111)
Creatinine: 0.73 mg/dL (ref 0.44–1.00)
GFR, Estimated: >60 mL/min (ref >60)
Glucose, Bld: 101 mg/dL — ABNORMAL HIGH (ref 70–99)
Potassium: 3.3 mmol/L — ABNORMAL LOW (ref 3.5–5.1)
Sodium: 141 mmol/L (ref 135–145)
Total Bilirubin: 0.5 mg/dL (ref 0.3–1.2)
Total Protein: 6.1 g/dL — ABNORMAL LOW (ref 6.5–8.1)

## 2020-10-24 MED ORDER — SODIUM CHLORIDE 0.9% FLUSH
10.0000 mL | INTRAVENOUS | Status: DC | PRN
  Filled 2020-10-24: qty 10

## 2020-10-24 MED ORDER — SODIUM CHLORIDE 0.9 % IV SOLN
2400.0000 mg/m2 | INTRAVENOUS | Status: DC
  Administered 2020-10-24: 4200 mg via INTRAVENOUS
  Filled 2020-10-24: qty 84

## 2020-10-24 MED ORDER — SODIUM CHLORIDE 0.9 % IV SOLN
400.0000 mg/m2 | Freq: Once | INTRAVENOUS | Status: AC
  Administered 2020-10-24: 704 mg via INTRAVENOUS
  Filled 2020-10-24: qty 35.2

## 2020-10-24 MED ORDER — ONDANSETRON HCL 4 MG/2ML IJ SOLN
INTRAMUSCULAR | Status: AC
  Filled 2020-10-24: qty 4

## 2020-10-24 MED ORDER — DEXAMETHASONE SODIUM PHOSPHATE 10 MG/ML IJ SOLN
8.0000 mg | Freq: Once | INTRAMUSCULAR | Status: AC
  Administered 2020-10-24: 8 mg via INTRAVENOUS

## 2020-10-24 MED ORDER — SODIUM CHLORIDE 0.9 % IV SOLN
16.0000 mg | Freq: Once | INTRAVENOUS | Status: AC
  Administered 2020-10-24: 16 mg via INTRAVENOUS
  Filled 2020-10-24: qty 8

## 2020-10-24 MED ORDER — ATROPINE SULFATE 1 MG/ML IJ SOLN
0.5000 mg | Freq: Once | INTRAMUSCULAR | Status: AC | PRN
  Administered 2020-10-24: 0.5 mg via INTRAVENOUS

## 2020-10-24 MED ORDER — SODIUM CHLORIDE 0.9% FLUSH
10.0000 mL | INTRAVENOUS | Status: DC | PRN
  Administered 2020-10-24: 10 mL
  Filled 2020-10-24: qty 10

## 2020-10-24 MED ORDER — DEXTROSE 5 % IV SOLN
Freq: Once | INTRAVENOUS | Status: AC
  Filled 2020-10-24: qty 250

## 2020-10-24 MED ORDER — SODIUM CHLORIDE 0.9 % IV SOLN
150.0000 mg/m2 | Freq: Once | INTRAVENOUS | Status: AC
  Administered 2020-10-24: 260 mg via INTRAVENOUS
  Filled 2020-10-24: qty 13

## 2020-10-24 MED ORDER — OXALIPLATIN CHEMO INJECTION 100 MG/20ML
85.0000 mg/m2 | Freq: Once | INTRAVENOUS | Status: AC
  Administered 2020-10-24: 150 mg via INTRAVENOUS
  Filled 2020-10-24: qty 20

## 2020-10-24 MED ORDER — SODIUM CHLORIDE 0.9 % IV SOLN
150.0000 mg | Freq: Once | INTRAVENOUS | Status: AC
  Administered 2020-10-24: 150 mg via INTRAVENOUS
  Filled 2020-10-24: qty 150

## 2020-10-24 MED ORDER — HEPARIN SOD (PORK) LOCK FLUSH 100 UNIT/ML IV SOLN
500.0000 [IU] | Freq: Once | INTRAVENOUS | Status: DC | PRN
  Filled 2020-10-24: qty 5

## 2020-10-24 MED ORDER — PROCHLORPERAZINE MALEATE 10 MG PO TABS
ORAL_TABLET | ORAL | Status: AC
  Filled 2020-10-24: qty 1

## 2020-10-24 MED ORDER — PROCHLORPERAZINE MALEATE 10 MG PO TABS
10.0000 mg | ORAL_TABLET | Freq: Once | ORAL | Status: AC
  Administered 2020-10-24: 10 mg via ORAL

## 2020-10-24 MED ORDER — ATROPINE SULFATE 1 MG/ML IJ SOLN
INTRAMUSCULAR | Status: AC
  Filled 2020-10-24: qty 1

## 2020-10-24 MED ORDER — DEXAMETHASONE SODIUM PHOSPHATE 10 MG/ML IJ SOLN
INTRAMUSCULAR | Status: AC
  Filled 2020-10-24: qty 1

## 2020-10-24 NOTE — Progress Notes (Signed)
Note entered in error

## 2020-10-24 NOTE — Progress Notes (Signed)
Ok to treat per Dr. Burr Medico with [place] 1.1  Pt. Stable at discharged. Assisted to lobby in wheelchair.

## 2020-10-24 NOTE — Patient Instructions (Signed)
[facility] Discharge Instructions for Patients Receiving Chemotherapy  Today you received the following chemotherapy agents oxaliplatin, irinotecan, leucovorin, fluorouracil.  To help prevent nausea and vomiting after your treatment, we encourage you to take your nausea medication as directed.    If you develop nausea and vomiting that is not controlled by your nausea medication, call the clinic.   BELOW ARE SYMPTOMS THAT SHOULD BE REPORTED IMMEDIATELY:  *FEVER GREATER THAN 100.5 F  *CHILLS WITH OR WITHOUT FEVER  NAUSEA AND VOMITING THAT IS NOT CONTROLLED WITH YOUR NAUSEA MEDICATION  *UNUSUAL SHORTNESS OF BREATH  *UNUSUAL BRUISING OR BLEEDING  TENDERNESS IN MOUTH AND THROAT WITH OR WITHOUT PRESENCE OF ULCERS  *URINARY PROBLEMS  *BOWEL PROBLEMS  UNUSUAL RASH Items with * indicate a potential emergency and should be followed up as soon as possible.  Feel free to call the clinic should you have any questions or concerns. The clinic phone number is [phone].  Please show the [facility] at check-in to the Emergency Department and triage nurse.

## 2020-10-24 NOTE — Patient Instructions (Signed)

## 2020-10-25 LAB — CANCER ANTIGEN 19-9: CA 19-9: 33 U/mL (ref 0–35)

## 2020-10-26 ENCOUNTER — Inpatient Hospital Stay: Payer: 59

## 2020-10-26 ENCOUNTER — Other Ambulatory Visit: Payer: Self-pay

## 2020-10-26 ENCOUNTER — Telehealth: Payer: Self-pay | Admitting: Hematology

## 2020-10-26 VITALS — BP 128/72 | HR 62 | Temp 98.2°F | Resp 16

## 2020-10-26 DIAGNOSIS — C250 Malignant neoplasm of head of pancreas: Secondary | ICD-10-CM

## 2020-10-26 MED ORDER — SODIUM CHLORIDE 0.9% FLUSH
10.0000 mL | INTRAVENOUS | Status: DC | PRN
  Filled 2020-10-26: qty 10

## 2020-10-26 MED ORDER — HEPARIN SOD (PORK) LOCK FLUSH 100 UNIT/ML IV SOLN
500.0000 [IU] | Freq: Once | INTRAVENOUS | Status: DC | PRN
  Filled 2020-10-26: qty 5

## 2020-10-26 NOTE — Telephone Encounter (Signed)
No 12/6 los. No changes made to pt's schedule.

## 2020-10-31 ENCOUNTER — Telehealth: Payer: Self-pay | Admitting: *Deleted

## 2020-11-06 NOTE — Progress Notes (Signed)
[name]   Telephone:(336) [phone] [phone]   Clinic Follow up Note   Patient Care Team: Nicola Girt, DO as PCP - General (Internal Medicine) Jonnie Finner, RN as Oncology Nurse Navigator Truitt Merle, MD as Consulting Physician (Oncology) Dwan Bolt, MD as Consulting Physician (General Surgery) 11/07/2020  CHIEF COMPLAINT: Follow up pancreas cancer   SUMMARY OF ONCOLOGIC HISTORY: Oncology History Overview Note  Cancer Staging Pancreatic cancer [facility]) Staging form: Exocrine Pancreas, AJCC 8th Edition - Clinical stage from 08/24/2020: Stage IIB (cT2, cN1, cM0) - Signed by Truitt Merle, MD on 08/30/2020    Pancreatic cancer ([place])  08/20/2020 Imaging   CT AP 08/20/20  IMPRESSION: 1. 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 Collateral circulation is noted. There is moderate intrahepatic and extrahepatic biliary dilatation which appears to be due to the pancreatic head mass. Pancreatic ductal dilatation is noted as well. 2. Probable 2.7 cm uterine fibroid. 3. Aortic atherosclerosis.   Aortic Atherosclerosis (ICD10-I70.0).   08/20/2020 Tumor Marker   Ca19-9 - 927   08/22/2020 Procedure   ERCP by Dr Watt Climes 08/22/20  IMPRESSION - The major papilla appeared normal. - A biliary sphincterotomy was performed. - Cells for cytology obtained in the lower third and middle of the main duct. - One plastic stent was placed into the common bile duct.   FINAL MICROSCOPIC DIAGNOSIS:  - No malignant cells identified  - Benign reactive/reparative changes   08/24/2020 Cancer Staging   Staging form: Exocrine Pancreas, AJCC 8th Edition - Clinical stage from 08/24/2020: Stage IIB (cT2, cN1, cM0) - Signed by Truitt Merle, MD on 08/30/2020   08/24/2020 Procedure   EUS by Dr Paulita Fujita  08/24/20  IMPRESSION - There was no sign of significant pathology in the ampulla. - A few malignant-appearing lymph nodes were visualized in the peripancreatic region and porta hepatis region. - One stent was visualized endosonographically in the common bile duct. - A mass was identified in the pancreatic head. Tissue was obtained from this exam. The preliminary diagnosis is consistent with adenocarcinoma. Invasion into SMV/PV seen. Lymphadenopathy noted. This was staged T3 N1 Mx by endosonographic criteria. Fine needle aspiration performed.   08/24/2020 Initial Biopsy   FINAL MICROSCOPIC DIAGNOSIS: 08/24/20 - Malignant cells consistent with adenocarcinoma    08/30/2020 Initial Diagnosis   Pancreatic cancer ([place])   09/05/2020 Imaging   CT Chest  IMPRESSION: 1. Stable 2.7 cm infiltrating pancreatic head mass with borderline enlarged peripancreatic lymph nodes. 2. No findings for pulmonary metastatic disease. 3. Small hiatal hernia. 4. Aortic atherosclerosis.   Aortic Atherosclerosis (ICD10-I70.0).   09/08/2020 Procedure   INSERTION PORT-A-CATH by Dr Zenia Resides and Barry Dienes    09/12/2020 -  Chemotherapy   Neoadjuvant FOLFIRINOX q2weeks starting 09/12/20      CURRENT THERAPY: Neoadjuvant FOLFIRINOX q2 weeks starting 09/12/20. Plan to repeat scan after cycle 6  INTERVAL HISTORY: Jacqueline Tyler returns for follow up and treatment as scheduled. She received cycle 4 FOLFIRINOX on 10/24/20 and continues home GCSF injections.  She feels the prolonged oxalic infusion is causing her side effects to last longer.  She has persistent cold sensitivity for about a week, with new mild residual tingling in her fingertips.  Her throat feels tighter longer, mouth feels raw but no obvious sores she is able to eat  and drink.  She had more nausea, still managed with antiemetics.  No vomiting, constipation, or diarrhea.  Denies abdominal pain.  She finally recovered around day 10.  Denies fever, chills, cough, chest  pain, dyspnea, leg edema.   MEDICAL HISTORY:  Past Medical History:  Diagnosis Date  . Anxiety   . Cancer ([place]) 07/2020   pancreatic cancer  . Depression   . Diabetes mellitus without complication ([place]) 81/0175   pancreatic cancer  . High cholesterol   . Hypertension     SURGICAL HISTORY: Past Surgical History:  Procedure Laterality Date  . BILIARY BRUSHING  08/22/2020   Procedure: BILIARY BRUSHING;  Surgeon: Clarene Essex, MD;  Location: [facility];  Service: Endoscopy;;  . BILIARY STENT PLACEMENT N/A 08/22/2020   Procedure: BILIARY STENT PLACEMENT;  Surgeon: Clarene Essex, MD;  Location: [facility];  Service: Endoscopy;  Laterality: N/A;  . ENDOSCOPIC RETROGRADE CHOLANGIOPANCREATOGRAPHY (ERCP) WITH PROPOFOL N/A 08/22/2020   Procedure: ENDOSCOPIC RETROGRADE CHOLANGIOPANCREATOGRAPHY (ERCP) WITH PROPOFOL;  Surgeon: Clarene Essex, MD;  Location: [facility];  Service: Endoscopy;  Laterality: N/A;  . ESOPHAGOGASTRODUODENOSCOPY (EGD) WITH PROPOFOL N/A 08/24/2020   Procedure: ESOPHAGOGASTRODUODENOSCOPY (EGD) WITH PROPOFOL;  Surgeon: Arta Silence, MD;  Location: [facility];  Service: Endoscopy;  Laterality: N/A;  . FINE NEEDLE ASPIRATION N/A 08/24/2020   Procedure: FINE NEEDLE ASPIRATION (FNA) LINEAR;  Surgeon: Arta Silence, MD;  Location: [facility];  Service: Endoscopy;  Laterality: N/A;  . PORTACATH PLACEMENT Right 09/08/2020   Procedure: INSERTION PORT-A-CATH;  Surgeon: Dwan Bolt, MD;  Location: [place];  Service: General;  Laterality: Right;  . SPHINCTEROTOMY  08/22/2020   Procedure: SPHINCTEROTOMY;  Surgeon: Clarene Essex, MD;  Location: [facility];  Service: Endoscopy;;  . UPPER ESOPHAGEAL ENDOSCOPIC ULTRASOUND (EUS) N/A 08/24/2020   Procedure: UPPER ESOPHAGEAL ENDOSCOPIC ULTRASOUND (EUS);  Surgeon: Arta Silence, MD;  Location: [facility];  Service: Endoscopy;  Laterality: N/A;    I have reviewed the social history and family history with the  patient and they are unchanged from previous note.  ALLERGIES:  is allergic to oxycodone-acetaminophen, poison ivy extract, and tyloxapol.  MEDICATIONS:  Current Outpatient Medications  Medication Sig Dispense Refill  . acetaminophen (TYLENOL) 325 MG tablet Take 650 mg by mouth every 6 (six) hours as needed.    . citalopram (CELEXA) 40 MG tablet Take 10 mg by mouth daily.     . cloNIDine (CATAPRES) 0.1 MG tablet Take 0.1 mg by mouth at bedtime.    . hydrochlorothiazide (HYDRODIURIL) 12.5 MG tablet Take 12.5 mg by mouth daily.    Marland Kitchen lidocaine-prilocaine (EMLA) cream Apply to affected area once 30 g 3  . omeprazole (PRILOSEC) 20 MG capsule Take 20 mg by mouth daily.    . ondansetron (ZOFRAN) 8 MG tablet Take 1 tablet (8 mg total) by mouth 2 (two) times daily as needed. Start on day 3 after chemotherapy. 30 tablet 1  . pegfilgrastim-bmez (ZIEXTENZO) 6 MG/0.6ML injection Inject 6 mg into the skin every 14 (fourteen) days.    . potassium chloride (KLOR-CON) 10 MEQ tablet Take 1 tablet (10 mEq total) by mouth 2 (two) times daily. 60 tablet 1  . prochlorperazine (COMPAZINE) 10 MG tablet Take 1 tablet (10 mg total) by mouth every 6 (six) hours as needed (Nausea or vomiting). 30 tablet 1  . simvastatin (ZOCOR) 80 MG tablet Take 80 mg by mouth daily.     No current facility-administered medications for this visit.   Facility-Administered Medications Ordered in Other Visits  Medication Dose Route Frequency Provider Last Rate Last Admin  . fluorouracil (ADRUCIL) 4,200 mg in sodium chloride 0.9 % 66 mL chemo infusion  2,400 mg/m2 (Treatment Plan Recorded) Intravenous 1 day or 1 dose Truitt Merle, MD      . heparin lock flush 100 unit/mL  500 Units Intracatheter Once PRN Truitt Merle, MD      . irinotecan (CAMPTOSAR) 260 mg in sodium chloride 0.9 % 500 mL chemo infusion  150 mg/m2 (Treatment Plan Recorded) Intravenous Once Truitt Merle, MD 342 mL/hr at 11/07/20 1543 260 mg at 11/07/20 1543  . leucovorin 704 mg in  sodium chloride 0.9 % 250 mL infusion  400 mg/m2 (Treatment Plan Recorded) Intravenous Once Truitt Merle, MD 190 mL/hr at 11/07/20 1546 704 mg at 11/07/20 1546  . sodium chloride flush (NS) 0.9 % injection 10 mL  10 mL Intracatheter PRN Truitt Merle, MD        PHYSICAL EXAMINATION: ECOG PERFORMANCE STATUS: 1 - Symptomatic but completely ambulatory  Vitals:   11/07/20 0915  BP: 138/76  Pulse: 66  Resp: 15  Temp: 97.7 F (36.5 C)  SpO2: 100%   Filed Weights   11/07/20 0915  Weight: 143 lb 3.2 oz (65 kg)    GENERAL:alert, no distress and comfortable SKIN: No rash.  Palms without erythema EYES: sclera clear OROPHARYNX: No thrush or ulcers LUNGS: clear with normal breathing effort HEART: regular rate & rhythm, no lower extremity edema ABDOMEN:abdomen soft, non-tender and normal bowel sounds NEURO: alert & oriented x 3 with fluent speech, no focal motor deficits.  Intact peripheral vibratory sense over the fingertips per tuning fork exam PAC without erythema  LABORATORY DATA:  I have reviewed the data as listed CBC Latest Ref Rng & Units 11/07/2020 10/24/2020 10/10/2020  WBC 4.0 - 10.5 K/uL 7.6 3.1(L) 31.7(H)  Hemoglobin 12.0 - 15.0 g/dL 10.7(L) 10.9(L) 11.4(L)  Hematocrit 36.0 - 46.0 % 32.9(L) 32.5(L) 35.8(L)  Platelets 150 - 400 K/uL 112(L) 136(L) 256     CMP Latest Ref Rng & Units 11/07/2020 10/24/2020 10/10/2020  Glucose 70 - 99 mg/dL 119(H) 101(H) 186(H)  BUN 6 - 20 mg/dL _0 Creatinine 0.44 - 1.00 mg/dL 0.73 0.73 0.94  Sodium 135 - 145 mmol/L 140 141 139  Potassium 3.5 - 5.1 mmol/L 3.0(L) 3.3(L) 3.0(L)  Chloride 98 - 111 mmol/L 108 108 106  CO2 22 - 32 mmol/L 23 25 20(L)  Calcium 8.9 - 10.3 mg/dL 8.5(L) 8.8(L) 8.7(L)  Total Protein 6.5 - 8.1 g/dL 6.0(L) 6.1(L) 6.6  Total Bilirubin 0.3 - 1.2 mg/dL 0.4 0.5 0.4  Alkaline Phos 38 - 126 U/L 526(H) 162(H) 323(H)  AST 15 - 41 U/L 48(H) 35 39  ALT 0 - 44 U/L 101(H) 34 24      RADIOGRAPHIC STUDIES: I have personally  reviewed the radiological images as listed and agreed with the findings in the report. No results found.   ASSESSMENT & PLAN: Jacqueline Tyler is a 59 y.o. female with    1. 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 CTdid not show other malignancy. -She was seen bysurgeon Dr Zenia Resides on 09/06/20,neoadjuvantchemo recommended -Due to the borderline resectable disease, shestarted neoadjuvant chemotherapy withFOLFIRINOX q2weekson 10/25/21to downstage her borderline resectable disease for3-78month before surgery with possible additional adjuvant chemoafter surgery.  Will scan her after 6 cycles oftreatment to monitor her response. -continue GCSF at home injection on day 3 given neutropenia. Continue with each cycle.  -s/p cycle 4, will proceed with cycle 5 today. F/u in 2 weeks   2. Diarrhea, Weight loss, Hypokalemia -S/p C2 she has had loose BM twice a day which has lead to a BM accident.  -She has imodium. Diarrhea resolved -Has persistent hypokalemia, K 3.0; take 10 mEq TID  3. Obstructive Jaundice, Transaminitis, Hyperbilirubinuria,Secondary to #1  -Her initial 08/20/20 labs showed AST 349, ALT 473, alk phos 903 and tbili 7.3.  -Plastic Stent placed by ERCP on 08/22/20 with Dr Watt Climes. May need stent exchange in the future. -T bili normalized, AST/ALT and alk phos fluctuate on treatment  4. HTN, Hyperglycemia  -During recent hospitalization for pancreatic cancer her 08/20/20 A1c was 7.1  -On Zocor, Clonidine and HCTZ for HTN.HCTZ held from start of chemo due to hypotension.  -f/up per PCP   5. Financial assistance and Social support, Depression -She notes concern about  continuing work when she can for money. She works for [employer] from home. Her husband recently retired but may return to work in near future.  -increased celexa to 20 mg daily recently. Improved   6. Neutropenia -resolved with GCSF on day 3 home injection  Disposition: Ms. Hinojosa appears stable.  She completed 4 cycles of neoadjuvant FOLFIRINOX.  She tolerated moderately well with more cold sensitivity with early neuropathy and nausea. No sensory deficits. She had prolonged recovery until day 10.  Side effects remain well managed with supportive care at home.  She is able to eventually recover and function well.  Labs reviewed, K 3.0, increase oral K 10 mEq to TID. Overall adequate to proceed with cycle 5 FOLFIRINOX today as planned.  We will dose reduce oxaliplatin to 75 mg/m2 for neuropathy.  She continues GCSF at home on day 3.  CA 19-9 normalized on chemo, suggesting she is likely responding to treatment. She understands we may recommend additional chemo after CT even if it shows good response and surgeon feels she is resectable.  She will return for cycle 6 in 2 weeks, then restaging scan after cycle 6.   Her spouse was included on the phone in today's visit. Their questions were answered.   Orders Placed This Encounter  Procedures  . CT Abdomen Pelvis W Wo Contrast    This exam should ONLY be ordered for initial diagnosis or follow up of known pancreatic/liver/renal/bladder masses.  Restaging pancreas cancer s/p 6 cycles chemo, borderline resectable; pancreatic protocol    Standing Status:   Future    Standing Expiration Date:   11/07/2021    Order Specific Question:   If indicated for the ordered procedure, I authorize the administration of contrast media per Radiology protocol    Answer:   Yes    Order Specific Question:   Is patient pregnant?    Answer:   No    Order Specific Question:   Preferred imaging location?    Answer:   [facility]    Order Specific  Question:   Is Oral Contrast requested for this exam?    Answer:   Yes, Per Radiology protocol   All questions were answered. The patient knows to call the clinic with any problems, questions or concerns. No barriers to learning were detected. Total encounter time was 30 minutes.      Alla Feeling, NP 11/07/20

## 2020-11-07 ENCOUNTER — Encounter: Payer: Self-pay | Admitting: Nurse Practitioner

## 2020-11-07 ENCOUNTER — Inpatient Hospital Stay: Payer: 59

## 2020-11-07 ENCOUNTER — Inpatient Hospital Stay (HOSPITAL_BASED_OUTPATIENT_CLINIC_OR_DEPARTMENT_OTHER): Payer: 59 | Admitting: Nurse Practitioner

## 2020-11-07 ENCOUNTER — Other Ambulatory Visit: Payer: Self-pay

## 2020-11-07 VITALS — BP 138/76 | HR 66 | Temp 97.7°F | Resp 15 | Ht 62.0 in | Wt 143.2 lb

## 2020-11-07 VITALS — BP 139/68 | HR 77 | Temp 98.0°F | Resp 20

## 2020-11-07 DIAGNOSIS — C250 Malignant neoplasm of head of pancreas: Secondary | ICD-10-CM

## 2020-11-07 DIAGNOSIS — Z95828 Presence of other vascular implants and grafts: Secondary | ICD-10-CM

## 2020-11-07 LAB — CMP (CANCER CENTER ONLY)
ALT: 101 U/L — ABNORMAL HIGH (ref 0–44)
AST: 48 U/L — ABNORMAL HIGH (ref 15–41)
Albumin: 3 g/dL — ABNORMAL LOW (ref 3.5–5.0)
Alkaline Phosphatase: 526 U/L — ABNORMAL HIGH (ref 38–126)
Anion gap: 9 (ref 5–15)
BUN: 7 mg/dL (ref 6–20)
CO2: 23 mmol/L (ref 22–32)
Calcium: 8.5 mg/dL — ABNORMAL LOW (ref 8.9–10.3)
Chloride: 108 mmol/L (ref 98–111)
Creatinine: 0.73 mg/dL (ref 0.44–1.00)
GFR, Estimated: >60 mL/min (ref >60)
Glucose, Bld: 119 mg/dL — ABNORMAL HIGH (ref 70–99)
Potassium: 3 mmol/L — ABNORMAL LOW (ref 3.5–5.1)
Sodium: 140 mmol/L (ref 135–145)
Total Bilirubin: 0.4 mg/dL (ref 0.3–1.2)
Total Protein: 6 g/dL — ABNORMAL LOW (ref 6.5–8.1)

## 2020-11-07 LAB — CBC WITH DIFFERENTIAL (CANCER CENTER ONLY)
Abs Immature Granulocytes: 0.25 10*3/uL — ABNORMAL HIGH (ref 0.00–0.07)
Basophils Absolute: 0.1 10*3/uL (ref 0.0–0.1)
Basophils Relative: 1 %
Eosinophils Absolute: 0 10*3/uL (ref 0.0–0.5)
Eosinophils Relative: 1 %
HCT: 32.9 % — ABNORMAL LOW (ref 36.0–46.0)
Hemoglobin: 10.7 g/dL — ABNORMAL LOW (ref 12.0–15.0)
Immature Granulocytes: 3 %
Lymphocytes Relative: 22 %
Lymphs Abs: 1.7 10*3/uL (ref 0.7–4.0)
MCH: 27.5 pg (ref 26.0–34.0)
MCHC: 32.5 g/dL (ref 30.0–36.0)
MCV: 84.6 fL (ref 80.0–100.0)
Monocytes Absolute: 0.7 10*3/uL (ref 0.1–1.0)
Monocytes Relative: 10 %
Neutro Abs: 4.8 10*3/uL (ref 1.7–7.7)
Neutrophils Relative %: 63 %
Platelet Count: 112 10*3/uL — ABNORMAL LOW (ref 150–400)
RBC: 3.89 MIL/uL (ref 3.87–5.11)
RDW: 18.6 % — ABNORMAL HIGH (ref 11.5–15.5)
WBC Count: 7.6 10*3/uL (ref 4.0–10.5)
nRBC: 0 % (ref 0.0–0.2)

## 2020-11-07 MED ORDER — FAMOTIDINE IN NACL 20-0.9 MG/50ML-% IV SOLN
INTRAVENOUS | Status: AC
  Filled 2020-11-07: qty 50

## 2020-11-07 MED ORDER — DEXAMETHASONE SODIUM PHOSPHATE 10 MG/ML IJ SOLN
8.0000 mg | Freq: Once | INTRAMUSCULAR | Status: AC
  Administered 2020-11-07: 11:00:00 8 mg via INTRAVENOUS

## 2020-11-07 MED ORDER — LEUCOVORIN CALCIUM INJECTION 350 MG
400.0000 mg/m2 | Freq: Once | INTRAMUSCULAR | Status: AC
  Administered 2020-11-07: 16:00:00 704 mg via INTRAVENOUS
  Filled 2020-11-07: qty 35.2

## 2020-11-07 MED ORDER — SODIUM CHLORIDE 0.9 % IV SOLN
16.0000 mg | Freq: Once | INTRAVENOUS | Status: AC
  Administered 2020-11-07: 12:00:00 16 mg via INTRAVENOUS
  Filled 2020-11-07: qty 8

## 2020-11-07 MED ORDER — DEXTROSE 5 % IV SOLN
Freq: Once | INTRAVENOUS | Status: AC
  Filled 2020-11-07: qty 250

## 2020-11-07 MED ORDER — SODIUM CHLORIDE 0.9 % IV SOLN
150.0000 mg | Freq: Once | INTRAVENOUS | Status: AC
  Administered 2020-11-07: 12:00:00 150 mg via INTRAVENOUS
  Filled 2020-11-07: qty 150

## 2020-11-07 MED ORDER — OXALIPLATIN CHEMO INJECTION 100 MG/20ML
75.0000 mg/m2 | Freq: Once | INTRAVENOUS | Status: AC
  Administered 2020-11-07: 13:00:00 130 mg via INTRAVENOUS
  Filled 2020-11-07: qty 26

## 2020-11-07 MED ORDER — SODIUM CHLORIDE 0.9% FLUSH
10.0000 mL | INTRAVENOUS | Status: DC | PRN
  Administered 2020-11-07: 09:00:00 10 mL
  Filled 2020-11-07: qty 10

## 2020-11-07 MED ORDER — HEPARIN SOD (PORK) LOCK FLUSH 100 UNIT/ML IV SOLN
500.0000 [IU] | Freq: Once | INTRAVENOUS | Status: DC | PRN
  Filled 2020-11-07: qty 5

## 2020-11-07 MED ORDER — SODIUM CHLORIDE 0.9 % IV SOLN
150.0000 mg/m2 | Freq: Once | INTRAVENOUS | Status: AC
  Administered 2020-11-07: 16:00:00 260 mg via INTRAVENOUS
  Filled 2020-11-07: qty 13

## 2020-11-07 MED ORDER — SODIUM CHLORIDE 0.9 % IV SOLN
2400.0000 mg/m2 | INTRAVENOUS | Status: DC
  Administered 2020-11-07: 18:00:00 4200 mg via INTRAVENOUS
  Filled 2020-11-07: qty 84

## 2020-11-07 MED ORDER — FAMOTIDINE IN NACL 20-0.9 MG/50ML-% IV SOLN
20.0000 mg | Freq: Once | INTRAVENOUS | Status: AC
  Administered 2020-11-07: 17:00:00 20 mg via INTRAVENOUS

## 2020-11-07 MED ORDER — ATROPINE SULFATE 1 MG/ML IJ SOLN
0.5000 mg | Freq: Once | INTRAMUSCULAR | Status: AC | PRN
  Administered 2020-11-07: 16:00:00 0.5 mg via INTRAVENOUS

## 2020-11-07 MED ORDER — DEXAMETHASONE SODIUM PHOSPHATE 10 MG/ML IJ SOLN
INTRAMUSCULAR | Status: AC
  Filled 2020-11-07: qty 1

## 2020-11-07 MED ORDER — ATROPINE SULFATE 1 MG/ML IJ SOLN
INTRAMUSCULAR | Status: AC
  Filled 2020-11-07: qty 1

## 2020-11-07 MED ORDER — SODIUM CHLORIDE 0.9% FLUSH
10.0000 mL | INTRAVENOUS | Status: DC | PRN
Start: 2020-11-07 — End: 2020-11-07
  Filled 2020-11-07: qty 10

## 2020-11-07 NOTE — Addendum Note (Signed)
Addended by: Alla Feeling on: 11/07/2020 05:19 PM   Modules accepted: Orders

## 2020-11-07 NOTE — Progress Notes (Signed)
Per Cira Rue, ok for treatment today with ALT 101. Pt. Instructed to take Potassium 10 meq 1 tab po tid. Pt. complained of dizziness, not more than she usually has with treatment. Irinotecan and leucovrin infusing at this time without 10 %. Vital signs stable, no respiratory distress noted. Cira Rue NP notified and new orders received.

## 2020-11-07 NOTE — Patient Instructions (Addendum)
[place] Discharge Instructions for Patients Receiving Chemotherapy  Today you received the following chemotherapy agents: Oxaliplatin, Irinotecan, Leucovorin, and Fluorouracil.  To help prevent nausea and vomiting after your treatment, we encourage you to take your nausea medication as directed by your MD.   If you develop nausea and vomiting that is not controlled by your nausea medication, call the clinic.   BELOW ARE SYMPTOMS THAT SHOULD BE REPORTED IMMEDIATELY:  *FEVER GREATER THAN 100.5 F  *CHILLS WITH OR WITHOUT FEVER  NAUSEA AND VOMITING THAT IS NOT CONTROLLED WITH YOUR NAUSEA MEDICATION  *UNUSUAL SHORTNESS OF BREATH  *UNUSUAL BRUISING OR BLEEDING  TENDERNESS IN MOUTH AND THROAT WITH OR WITHOUT PRESENCE OF ULCERS  *URINARY PROBLEMS  *BOWEL PROBLEMS  UNUSUAL RASH Items with * indicate a potential emergency and should be followed up as soon as possible.  Feel free to call the clinic should you have any questions or concerns. The clinic phone number is [phone].  Please show the [place] at check-in to the Emergency Department and triage nurse.   [place] Discharge Instructions for Patients receiving Home Portable Chemo Pump    **The bag should finish at 46 hours, 96 hours or 7 days. For example, if your pump is scheduled for 46 hours and it was put on at 4pm, it should finish at 2 pm the day it is scheduled to come off regardless of your appointment time.    Estimated time to finish   _________________________ (Have your nurse fill in)     ** if the display on your pump reads "Low Volume" and it is beeping, take the batteries out of the pump and come to the cancer center for it to be taken off.   **If the pump alarms go off prior to the pump reading "Low Volume" then call the [phone] and someone can assist you.  **If the plunger comes out and the bag fluid is running out, please use your chemo spill kit to  clean up the spill. Do not use paper towels or other house hold products.  ** If you have problems or questions regarding your pump, please call either the [phone] or the cancer center Monday-Friday 8:00am-4:30pm at [phone] and we will assist you.  If you are unable to get assistance then go to [facility] Emergency Room, ask the staff to contact the IV team for assistance.    **Take Potassium 10 meq 1 tablet by mouth three times per day.   Hypokalemia Hypokalemia means that the amount of potassium in the blood is lower than normal. Potassium is a chemical (electrolyte) that helps regulate the amount of fluid in the body. It also stimulates muscle tightening (contraction) and helps nerves work properly. Normally, most of the body's potassium is inside cells, and only a very small amount is in the blood. Because the amount in the blood is so small, minor changes to potassium levels in the blood can be life-threatening. What are the causes? This condition may be caused by:  Antibiotic medicine.  Diarrhea or vomiting. Taking too much of a medicine that helps you have a bowel movement (laxative) can cause diarrhea and lead to hypokalemia.  Chronic kidney disease (CKD).  Medicines that help the body get rid of excess fluid (diuretics).  Eating disorders, such as bulimia.  Low magnesium levels in the body.  Sweating a lot. What are the signs or symptoms? Symptoms of this condition include:  Weakness.  Constipation.  Fatigue.  Muscle cramps.  Mental confusion.  Skipped heartbeats or irregular heartbeat (palpitations).  Tingling or numbness. How is this diagnosed? This condition is diagnosed with a blood test. How is this treated? This condition may be treated by:  Taking potassium supplements by mouth.  Adjusting the medicines that you take.  Eating more foods that contain a lot of potassium. If your potassium level is very low, you may need to get  potassium through an IV and be monitored in the hospital. Follow these instructions at home:   Take over-the-counter and prescription medicines only as told by your health care provider. This includes vitamins and supplements.  Eat a healthy diet. A healthy diet includes fresh fruits and vegetables, whole grains, healthy fats, and lean proteins.  If instructed, eat more foods that contain a lot of potassium. This includes: ? Nuts, such as peanuts and pistachios. ? Seeds, such as sunflower seeds and pumpkin seeds. ? Peas, lentils, and lima beans. ? Whole grain and bran cereals and breads. ? Fresh fruits and vegetables, such as apricots, avocado, bananas, cantaloupe, kiwi, oranges, tomatoes, asparagus, and potatoes. ? Orange juice. ? Tomato juice. ? Red meats. ? Yogurt.  Keep all follow-up visits as told by your health care provider. This is important. Contact a health care provider if you:  Have weakness that gets worse.  Feel your heart pounding or racing.  Vomit.  Have diarrhea.  Have diabetes (diabetes mellitus) and you have trouble keeping your blood sugar (glucose) in your target range. Get help right away if you:  Have chest pain.  Have shortness of breath.  Have vomiting or diarrhea that lasts for more than 2 days.  Faint. Summary  Hypokalemia means that the amount of potassium in the blood is lower than normal.  This condition is diagnosed with a blood test.  Hypokalemia may be treated by taking potassium supplements, adjusting the medicines that you take, or eating more foods that are high in potassium.  If your potassium level is very low, you may need to get potassium through an IV and be monitored in the hospital. This information is not intended to replace advice given to you by your health care provider. Make sure you discuss any questions you have with your health care provider. Document Revised: 06/18/2018 Document Reviewed: 06/18/2018 Elsevier  Patient Education  [place].

## 2020-11-08 ENCOUNTER — Telehealth: Payer: Self-pay | Admitting: Nurse Practitioner

## 2020-11-08 NOTE — Telephone Encounter (Signed)
Scheduled appts per 12/21 los. Pt to get updated appt calendar at next visit per appt notes.  

## 2020-11-09 ENCOUNTER — Other Ambulatory Visit: Payer: Self-pay

## 2020-11-09 ENCOUNTER — Inpatient Hospital Stay: Payer: 59

## 2020-11-09 VITALS — BP 148/82 | HR 83 | Temp 98.0°F | Resp 18

## 2020-11-09 DIAGNOSIS — C250 Malignant neoplasm of head of pancreas: Secondary | ICD-10-CM

## 2020-11-09 MED ORDER — SODIUM CHLORIDE 0.9% FLUSH
10.0000 mL | INTRAVENOUS | Status: DC | PRN
Start: 2020-11-09 — End: 2020-11-09
  Administered 2020-11-09: 16:00:00 10 mL
  Filled 2020-11-09: qty 10

## 2020-11-09 MED ORDER — HEPARIN SOD (PORK) LOCK FLUSH 100 UNIT/ML IV SOLN
500.0000 [IU] | Freq: Once | INTRAVENOUS | Status: AC | PRN
Start: 2020-11-09 — End: 2020-11-09
  Administered 2020-11-09: 16:00:00 500 [IU]
  Filled 2020-11-09: qty 5

## 2020-11-09 NOTE — Patient Instructions (Signed)

## 2020-11-16 ENCOUNTER — Telehealth: Payer: Self-pay | Admitting: Hematology

## 2020-11-16 NOTE — Telephone Encounter (Signed)
Released records to [facility] to [phone]   Release: [phone]

## 2020-11-21 ENCOUNTER — Inpatient Hospital Stay: Payer: BC Managed Care – PPO

## 2020-11-21 ENCOUNTER — Inpatient Hospital Stay: Payer: BC Managed Care – PPO | Attending: Hematology | Admitting: Hematology

## 2020-11-21 ENCOUNTER — Other Ambulatory Visit: Payer: Self-pay

## 2020-11-21 ENCOUNTER — Encounter: Payer: Self-pay | Admitting: Hematology

## 2020-11-21 VITALS — BP 137/78 | HR 78 | Temp 98.1°F | Resp 16 | Ht 62.0 in | Wt 141.1 lb

## 2020-11-21 DIAGNOSIS — I10 Essential (primary) hypertension: Secondary | ICD-10-CM | POA: Diagnosis not present

## 2020-11-21 DIAGNOSIS — C250 Malignant neoplasm of head of pancreas: Secondary | ICD-10-CM | POA: Diagnosis present

## 2020-11-21 DIAGNOSIS — E119 Type 2 diabetes mellitus without complications: Secondary | ICD-10-CM

## 2020-11-21 DIAGNOSIS — Z79899 Other long term (current) drug therapy: Secondary | ICD-10-CM | POA: Insufficient documentation

## 2020-11-21 DIAGNOSIS — Z5111 Encounter for antineoplastic chemotherapy: Secondary | ICD-10-CM | POA: Diagnosis present

## 2020-11-21 DIAGNOSIS — C259 Malignant neoplasm of pancreas, unspecified: Secondary | ICD-10-CM

## 2020-11-21 DIAGNOSIS — Z95828 Presence of other vascular implants and grafts: Secondary | ICD-10-CM

## 2020-11-21 LAB — CMP (CANCER CENTER ONLY)
ALT: 44 U/L (ref 0–44)
AST: 49 U/L — ABNORMAL HIGH (ref 15–41)
Albumin: 3.2 g/dL — ABNORMAL LOW (ref 3.5–5.0)
Alkaline Phosphatase: 373 U/L — ABNORMAL HIGH (ref 38–126)
Anion gap: 9 (ref 5–15)
BUN: 8 mg/dL (ref 6–20)
CO2: 23 mmol/L (ref 22–32)
Calcium: 8.6 mg/dL — ABNORMAL LOW (ref 8.9–10.3)
Chloride: 109 mmol/L (ref 98–111)
Creatinine: 0.82 mg/dL (ref 0.44–1.00)
GFR, Estimated: >60 mL/min (ref >60)
Glucose, Bld: 102 mg/dL — ABNORMAL HIGH (ref 70–99)
Potassium: 3.1 mmol/L — ABNORMAL LOW (ref 3.5–5.1)
Sodium: 141 mmol/L (ref 135–145)
Total Bilirubin: 0.4 mg/dL (ref 0.3–1.2)
Total Protein: 6.2 g/dL — ABNORMAL LOW (ref 6.5–8.1)

## 2020-11-21 LAB — CBC WITH DIFFERENTIAL (CANCER CENTER ONLY)
Abs Immature Granulocytes: 2.51 10*3/uL — ABNORMAL HIGH (ref 0.00–0.07)
Basophils Absolute: 0 10*3/uL (ref 0.0–0.1)
Basophils Relative: 0 %
Eosinophils Absolute: 0 10*3/uL (ref 0.0–0.5)
Eosinophils Relative: 0 %
HCT: 33.6 % — ABNORMAL LOW (ref 36.0–46.0)
Hemoglobin: 11 g/dL — ABNORMAL LOW (ref 12.0–15.0)
Immature Granulocytes: 16 %
Lymphocytes Relative: 16 %
Lymphs Abs: 2.6 10*3/uL (ref 0.7–4.0)
MCH: 28.1 pg (ref 26.0–34.0)
MCHC: 32.7 g/dL (ref 30.0–36.0)
MCV: 85.7 fL (ref 80.0–100.0)
Monocytes Absolute: 1.5 10*3/uL — ABNORMAL HIGH (ref 0.1–1.0)
Monocytes Relative: 10 %
Neutro Abs: 9.3 10*3/uL — ABNORMAL HIGH (ref 1.7–7.7)
Neutrophils Relative %: 58 %
Platelet Count: 190 10*3/uL (ref 150–400)
RBC: 3.92 MIL/uL (ref 3.87–5.11)
RDW: 19.9 % — ABNORMAL HIGH (ref 11.5–15.5)
WBC Count: 15.9 10*3/uL — ABNORMAL HIGH (ref 4.0–10.5)
nRBC: 0.7 % — ABNORMAL HIGH (ref 0.0–0.2)

## 2020-11-21 MED ORDER — SODIUM CHLORIDE 0.9 % IV SOLN
150.0000 mg/m2 | Freq: Once | INTRAVENOUS | Status: AC
  Administered 2020-11-21: 260 mg via INTRAVENOUS
  Filled 2020-11-21: qty 13

## 2020-11-21 MED ORDER — OXALIPLATIN CHEMO INJECTION 100 MG/20ML
75.0000 mg/m2 | Freq: Once | INTRAVENOUS | Status: AC
  Administered 2020-11-21: 130 mg via INTRAVENOUS
  Filled 2020-11-21: qty 20

## 2020-11-21 MED ORDER — SODIUM CHLORIDE 0.9 % IV SOLN
2400.0000 mg/m2 | INTRAVENOUS | Status: DC
  Administered 2020-11-21: 4200 mg via INTRAVENOUS
  Filled 2020-11-21: qty 84

## 2020-11-21 MED ORDER — DEXTROSE 5 % IV SOLN
Freq: Once | INTRAVENOUS | Status: AC
  Filled 2020-11-21: qty 250

## 2020-11-21 MED ORDER — SODIUM CHLORIDE 0.9 % IV SOLN
400.0000 mg/m2 | Freq: Once | INTRAVENOUS | Status: AC
  Administered 2020-11-21: 704 mg via INTRAVENOUS
  Filled 2020-11-21: qty 35.2

## 2020-11-21 MED ORDER — SODIUM CHLORIDE 0.9% FLUSH
10.0000 mL | INTRAVENOUS | Status: DC | PRN
  Administered 2020-11-21: 10 mL
  Filled 2020-11-21: qty 10

## 2020-11-21 MED ORDER — SODIUM CHLORIDE 0.9% FLUSH
10.0000 mL | INTRAVENOUS | Status: DC | PRN
  Filled 2020-11-21: qty 10

## 2020-11-21 MED ORDER — SODIUM CHLORIDE 0.9 % IV SOLN
16.0000 mg | Freq: Once | INTRAVENOUS | Status: AC
  Administered 2020-11-21: 16 mg via INTRAVENOUS
  Filled 2020-11-21: qty 8

## 2020-11-21 MED ORDER — FAMOTIDINE IN NACL 20-0.9 MG/50ML-% IV SOLN
20.0000 mg | Freq: Once | INTRAVENOUS | Status: AC
  Administered 2020-11-21: 20 mg via INTRAVENOUS

## 2020-11-21 MED ORDER — ATROPINE SULFATE 1 MG/ML IJ SOLN
INTRAMUSCULAR | Status: AC
  Filled 2020-11-21: qty 1

## 2020-11-21 MED ORDER — DEXAMETHASONE SODIUM PHOSPHATE 10 MG/ML IJ SOLN
8.0000 mg | Freq: Once | INTRAMUSCULAR | Status: AC
Start: 2020-11-21 — End: 2020-11-21
  Administered 2020-11-21: 8 mg via INTRAVENOUS

## 2020-11-21 MED ORDER — FAMOTIDINE IN NACL 20-0.9 MG/50ML-% IV SOLN
INTRAVENOUS | Status: AC
  Filled 2020-11-21: qty 50

## 2020-11-21 MED ORDER — HEPARIN SOD (PORK) LOCK FLUSH 100 UNIT/ML IV SOLN
500.0000 [IU] | Freq: Once | INTRAVENOUS | Status: DC | PRN
  Filled 2020-11-21: qty 5

## 2020-11-21 MED ORDER — DEXTROSE 5 % IV SOLN
Freq: Once | INTRAVENOUS | Status: DC
  Filled 2020-11-21: qty 250

## 2020-11-21 MED ORDER — ATROPINE SULFATE 1 MG/ML IJ SOLN
0.5000 mg | Freq: Once | INTRAMUSCULAR | Status: AC | PRN
  Administered 2020-11-21: 0.5 mg via INTRAVENOUS

## 2020-11-21 MED ORDER — DEXAMETHASONE SODIUM PHOSPHATE 10 MG/ML IJ SOLN
INTRAMUSCULAR | Status: AC
  Filled 2020-11-21: qty 1

## 2020-11-21 MED ORDER — SODIUM CHLORIDE 0.9 % IV SOLN
150.0000 mg | Freq: Once | INTRAVENOUS | Status: AC
  Administered 2020-11-21: 150 mg via INTRAVENOUS
  Filled 2020-11-21: qty 150

## 2020-11-21 NOTE — Progress Notes (Signed)
Pt. complained of dizziness and nausea. Marga Hoots PA notified and new orders received. Medicated with Pepcid 20 mg IV. Pt. states a decrease in dizziness and nausea after medication. Chemotherapy completed without further difficulty. Pt. stable for discharge, left via wheelchair, no respiratory distress noted.

## 2020-11-21 NOTE — Patient Instructions (Signed)
[place] Discharge Instructions for Patients Receiving Chemotherapy  Today you received the following chemotherapy agents: Oxaliplatin, Irinotecan, Leucovorin, and Fluorouracil.  To help prevent nausea and vomiting after your treatment, we encourage you to take your nausea medication as directed by your MD.   If you develop nausea and vomiting that is not controlled by your nausea medication, call the clinic.   BELOW ARE SYMPTOMS THAT SHOULD BE REPORTED IMMEDIATELY:  *FEVER GREATER THAN 100.5 F  *CHILLS WITH OR WITHOUT FEVER  NAUSEA AND VOMITING THAT IS NOT CONTROLLED WITH YOUR NAUSEA MEDICATION  *UNUSUAL SHORTNESS OF BREATH  *UNUSUAL BRUISING OR BLEEDING  TENDERNESS IN MOUTH AND THROAT WITH OR WITHOUT PRESENCE OF ULCERS  *URINARY PROBLEMS  *BOWEL PROBLEMS  UNUSUAL RASH Items with * indicate a potential emergency and should be followed up as soon as possible.  Feel free to call the clinic should you have any questions or concerns. The clinic phone number is [phone].  Please show the [place] at check-in to the Emergency Department and triage nurse.  [place] Discharge Instructions for Patients receiving Home Portable Chemo Pump    **The bag should finish at 46 hours, 96 hours or 7 days. For example, if your pump is scheduled for 46 hours and it was put on at 4pm, it should finish at 2 pm the day it is scheduled to come off regardless of your appointment time.    Estimated time to finish   _________________________ (Have your nurse fill in)     ** if the display on your pump reads "Low Volume" and it is beeping, take the batteries out of the pump and come to the cancer center for it to be taken off.   **If the pump alarms go off prior to the pump reading "Low Volume" then call the [phone] and someone can assist you.  **If the plunger comes out and the bag fluid is running out, please use your chemo spill kit to  clean up the spill. Do not use paper towels or other house hold products.  ** If you have problems or questions regarding your pump, please call either the [phone] or the cancer center Monday-Friday 8:00am-4:30pm at [phone] and we will assist you.  If you are unable to get assistance then go to [facility] Emergency Room, ask the staff to contact the IV team for assistance.

## 2020-11-21 NOTE — Progress Notes (Signed)
[place]   [phone] [phone]   Clinic Follow up Note   Patient Care Team: Nicola Girt, DO as PCP - General (Internal Medicine) Jonnie Finner, RN as Oncology Nurse Navigator Truitt Merle, MD as Consulting Physician (Oncology) Dwan Bolt, MD as Consulting Physician (General Surgery)  Date of Service:  11/21/2020  CHIEF COMPLAINT: F/u of pancreatic cancer  SUMMARY OF ONCOLOGIC HISTORY: Oncology History Overview Note  Cancer Staging Pancreatic cancer [facility]) Staging form: Exocrine Pancreas, AJCC 8th Edition - Clinical stage from 08/24/2020: Stage IIB (cT2, cN1, cM0) - Signed by Truitt Merle, MD on 08/30/2020    Pancreatic cancer ([place])  08/20/2020 Imaging   CT AP 08/20/20  IMPRESSION: 1. 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 Collateral circulation is noted. There is moderate intrahepatic and extrahepatic biliary dilatation which appears to be due to the pancreatic head mass. Pancreatic ductal dilatation is noted as well. 2. Probable 2.7 cm uterine fibroid. 3. Aortic atherosclerosis.   Aortic Atherosclerosis (ICD10-I70.0).   08/20/2020 Tumor Marker   Ca19-9 - 927   08/22/2020 Procedure   ERCP by Dr Watt Climes 08/22/20  IMPRESSION - The major papilla appeared normal. - A biliary sphincterotomy was performed. - Cells for cytology obtained in the lower third and middle of the main duct. - One plastic stent was placed into the common bile duct.   FINAL MICROSCOPIC DIAGNOSIS:  - No malignant cells identified  - Benign reactive/reparative changes   08/24/2020 Cancer Staging   Staging form: Exocrine Pancreas, AJCC 8th Edition - Clinical stage from 08/24/2020: Stage IIB (cT2, cN1, cM0) - Signed by Truitt Merle, MD on 08/30/2020   08/24/2020 Procedure   EUS  by Dr Paulita Fujita 08/24/20  IMPRESSION - There was no sign of significant pathology in the ampulla. - A few malignant-appearing lymph nodes were visualized in the peripancreatic region and porta hepatis region. - One stent was visualized endosonographically in the common bile duct. - A mass was identified in the pancreatic head. Tissue was obtained from this exam. The preliminary diagnosis is consistent with adenocarcinoma. Invasion into SMV/PV seen. Lymphadenopathy noted. This was staged T3 N1 Mx by endosonographic criteria. Fine needle aspiration performed.   08/24/2020 Initial Biopsy   FINAL MICROSCOPIC DIAGNOSIS: 08/24/20 - Malignant cells consistent with adenocarcinoma    08/30/2020 Initial Diagnosis   Pancreatic cancer ([place])   09/05/2020 Imaging   CT Chest  IMPRESSION: 1. Stable 2.7 cm infiltrating pancreatic head mass with borderline enlarged peripancreatic lymph nodes. 2. No findings for pulmonary metastatic disease. 3. Small hiatal hernia. 4. Aortic atherosclerosis.   Aortic Atherosclerosis (ICD10-I70.0).   09/08/2020 Procedure   INSERTION PORT-A-CATH by Dr Zenia Resides and Barry Dienes    09/12/2020 -  Chemotherapy   Neoadjuvant FOLFIRINOX q2weeks starting 09/12/20       CURRENT THERAPY:  Neoadjuvant FOLFIRINOX q2 weeks starting 09/12/20   INTERVAL HISTORY:  Jacqueline Tyler is here for a follow up. She presents to the clinic alone. She called her husband to be included in the visit. She notes her fifth cycle did go better. She notes she was able to ambulate well after her infusion. Her day 2-3 was rough but able to recover. She notes she still has tingling with cold sensitivity.  She was able to recover in 10 days. She  lost 6 pounds in the last month. She notes she has Ensure. She is able to eat [place] food. Her baseline weight before chemo was 160 pounds.     REVIEW OF SYSTEMS:   Constitutional: Denies fevers, chills (+) weight loss Eyes: Denies blurriness of vision Ears, nose,  mouth, throat, and face: Denies mucositis or sore throat Respiratory: Denies cough, dyspnea or wheezes Cardiovascular: Denies palpitation, chest discomfort or lower extremity swelling Gastrointestinal:  Denies nausea, heartburn or change in bowel habits Skin: Denies abnormal skin rashes Lymphatics: Denies new lymphadenopathy or easy bruising Neurological: (+) Cold sensitivity, tingling in hands  Behavioral/Psych: Mood is stable, no new changes  All other systems were reviewed with the patient and are negative.  MEDICAL HISTORY:  Past Medical History:  Diagnosis Date  . Anxiety   . Cancer ([place]) 07/2020   pancreatic cancer  . Depression   . Diabetes mellitus without complication ([place]) [id number]   pancreatic cancer  . High cholesterol   . Hypertension     SURGICAL HISTORY: Past Surgical History:  Procedure Laterality Date  . BILIARY BRUSHING  08/22/2020   Procedure: BILIARY BRUSHING;  Surgeon: Clarene Essex, MD;  Location: [facility];  Service: Endoscopy;;  . BILIARY STENT PLACEMENT N/A 08/22/2020   Procedure: BILIARY STENT PLACEMENT;  Surgeon: Clarene Essex, MD;  Location: [facility];  Service: Endoscopy;  Laterality: N/A;  . ENDOSCOPIC RETROGRADE CHOLANGIOPANCREATOGRAPHY (ERCP) WITH PROPOFOL N/A 08/22/2020   Procedure: ENDOSCOPIC RETROGRADE CHOLANGIOPANCREATOGRAPHY (ERCP) WITH PROPOFOL;  Surgeon: Clarene Essex, MD;  Location: [facility];  Service: Endoscopy;  Laterality: N/A;  . ESOPHAGOGASTRODUODENOSCOPY (EGD) WITH PROPOFOL N/A 08/24/2020   Procedure: ESOPHAGOGASTRODUODENOSCOPY (EGD) WITH PROPOFOL;  Surgeon: Arta Silence, MD;  Location: [facility];  Service: Endoscopy;  Laterality: N/A;  . FINE NEEDLE ASPIRATION N/A 08/24/2020   Procedure: FINE NEEDLE ASPIRATION (FNA) LINEAR;  Surgeon: Arta Silence, MD;  Location: [facility];  Service: Endoscopy;  Laterality: N/A;  . PORTACATH PLACEMENT Right 09/08/2020   Procedure: INSERTION PORT-A-CATH;  Surgeon: Dwan Bolt, MD;   Location: [place];  Service: General;  Laterality: Right;  . SPHINCTEROTOMY  08/22/2020   Procedure: SPHINCTEROTOMY;  Surgeon: Clarene Essex, MD;  Location: [facility];  Service: Endoscopy;;  . UPPER ESOPHAGEAL ENDOSCOPIC ULTRASOUND (EUS) N/A 08/24/2020   Procedure: UPPER ESOPHAGEAL ENDOSCOPIC ULTRASOUND (EUS);  Surgeon: Arta Silence, MD;  Location: [facility];  Service: Endoscopy;  Laterality: N/A;    I have reviewed the social history and family history with the patient and they are unchanged from previous note.  ALLERGIES:  is allergic to oxycodone-acetaminophen, poison ivy extract, and tyloxapol.  MEDICATIONS:  Current Outpatient Medications  Medication Sig Dispense Refill  . acetaminophen (TYLENOL) 325 MG tablet Take 650 mg by mouth every 6 (six) hours as needed.    . citalopram (CELEXA) 40 MG tablet Take 10 mg by mouth daily.     . cloNIDine (CATAPRES) 0.1 MG tablet Take 0.1 mg by mouth at bedtime.    . hydrochlorothiazide (HYDRODIURIL) 12.5 MG tablet Take 12.5 mg by mouth daily.    Marland Kitchen lidocaine-prilocaine (EMLA) cream Apply to affected area once 30 g 3  . omeprazole (PRILOSEC) 20 MG capsule Take 20 mg by mouth daily.    . ondansetron (ZOFRAN) 8 MG tablet Take 1 tablet (8 mg total) by mouth 2 (two) times daily as needed. Start on day 3 after chemotherapy. 30 tablet 1  . pegfilgrastim-bmez (ZIEXTENZO) 6 MG/0.6ML injection Inject 6 mg into the skin every 14 (fourteen) days.    Marland Kitchen  potassium chloride (KLOR-CON) 10 MEQ tablet Take 1 tablet (10 mEq total) by mouth 2 (two) times daily. 60 tablet 1  . prochlorperazine (COMPAZINE) 10 MG tablet Take 1 tablet (10 mg total) by mouth every 6 (six) hours as needed (Nausea or vomiting). 30 tablet 1  . simvastatin (ZOCOR) 80 MG tablet Take 80 mg by mouth daily.     No current facility-administered medications for this visit.   Facility-Administered Medications Ordered in Other Visits  Medication Dose Route Frequency Provider  Last Rate Last Admin  . atropine injection 0.5 mg  0.5 mg Intravenous Once PRN Truitt Merle, MD      . dextrose 5 % solution   Intravenous Once Truitt Merle, MD      . fluorouracil (ADRUCIL) 4,200 mg in sodium chloride 0.9 % 66 mL chemo infusion  2,400 mg/m2 (Treatment Plan Recorded) Intravenous 1 day or 1 dose Truitt Merle, MD      . heparin lock flush 100 unit/mL  500 Units Intracatheter Once PRN Truitt Merle, MD      . irinotecan (CAMPTOSAR) 260 mg in sodium chloride 0.9 % 500 mL chemo infusion  150 mg/m2 (Treatment Plan Recorded) Intravenous Once Truitt Merle, MD      . leucovorin 704 mg in sodium chloride 0.9 % 250 mL infusion  400 mg/m2 (Treatment Plan Recorded) Intravenous Once Truitt Merle, MD      . oxaliplatin (ELOXATIN) 130 mg in dextrose 5 % 500 mL chemo infusion  75 mg/m2 (Treatment Plan Recorded) Intravenous Once Truitt Merle, MD      . sodium chloride flush (NS) 0.9 % injection 10 mL  10 mL Intracatheter PRN Truitt Merle, MD        PHYSICAL EXAMINATION: ECOG PERFORMANCE STATUS: 2 - Symptomatic, <50% confined to bed  Vitals:   11/21/20 0846  BP: 137/78  Pulse: 78  Resp: 16  Temp: 98.1 F (36.7 C)  SpO2: 99%   Filed Weights   11/21/20 0846  Weight: 141 lb 1.6 oz (64 kg)   Due to COVID19 we will limit examination to appearance. Patient had no complaints.  GENERAL:alert, no distress and comfortable SKIN: skin color normal, no rashes or significant lesions EYES: normal, Conjunctiva are pink and non-injected, sclera clear  NEURO: alert & oriented x 3 with fluent speech   LABORATORY DATA:  I have reviewed the data as listed CBC Latest Ref Rng & Units 11/21/2020 11/07/2020 10/24/2020  WBC 4.0 - 10.5 K/uL 15.9(H) 7.6 3.1(L)  Hemoglobin 12.0 - 15.0 g/dL 11.0(L) 10.7(L) 10.9(L)  Hematocrit 36.0 - 46.0 % 33.6(L) 32.9(L) 32.5(L)  Platelets 150 - 400 K/uL 190 112(L) 136(L)     CMP Latest Ref Rng & Units 11/21/2020 11/07/2020 10/24/2020  Glucose 70 - 99 mg/dL 102(H) 119(H) 101(H)  BUN 6 - 20 mg/dL $Remove'8  7 6  'kKYJjGZ$ Creatinine 0.44 - 1.00 mg/dL 0.82 0.73 0.73  Sodium 135 - 145 mmol/L 141 140 141  Potassium 3.5 - 5.1 mmol/L 3.1(L) 3.0(L) 3.3(L)  Chloride 98 - 111 mmol/L 109 108 108  CO2 22 - 32 mmol/L $RemoveB'23 23 25  'gYVrXSWW$ Calcium 8.9 - 10.3 mg/dL 8.6(L) 8.5(L) 8.8(L)  Total Protein 6.5 - 8.1 g/dL 6.2(L) 6.0(L) 6.1(L)  Total Bilirubin 0.3 - 1.2 mg/dL 0.4 0.4 0.5  Alkaline Phos 38 - 126 U/L 373(H) 526(H) 162(H)  AST 15 - 41 U/L 49(H) 48(H) 35  ALT 0 - 44 U/L 44 101(H) 34      RADIOGRAPHIC STUDIES: I have personally reviewed the radiological images  as listed and agreed with the findings in the report. No results found.   ASSESSMENT & PLAN:  Jacqueline Tyler is a 60 y.o. female with   1. 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 CTdid not show other malignancy. -She was seen bysurgeon Dr Zenia Resides on 09/06/20,neoadjuvantchemo recommended -Due to the borderline resectable disease, Istarted her onneoadjuvant chemotherapy withFOLFIRINOX q2weekson 10/25/21to downstage her borderline resectable disease for3-19months (about 8 cycles) before surgery with possible additional adjuvant chemoafter surgery. Will scan her after 6 cycles oftreatment to monitor her response. -S/p C5 she tolerated better with Oxaliplatin dose reduction. Her cold sensitivity and tingling in her hands was more manageable. Her weight does continue to slowly decrease. I reviewed management with her and will refer to dietician.  -Labs reviewed and adequate to proceed with C6 FOLFIRINOX today at same dose.  -Her CA 19.9 has been decreasing, indicating good clinical response. Plan to proceed with CT AP on 12/03/19. Will review results with Dr  Zenia Resides.  -F/u in 2 weeks   2. Diarrhea, Weight loss, Hypokalemia -S/p C2 she has had loose BM twice a day which has lead to a BM accident.  -She has imodium. Diarrhea resolved -Has persistent hypokalemia, K 3.1 (11/21/20); take 10 mEq TID -Her baseline weight was 160 pounds. She weight continues to slowly trend down.  -I reviewed using high protein, high calorie diet and increase Ensure. I will refer her to dietician. She is agreeable.   3. Obstructive Jaundice, Transaminitis, Hyperbilirubinuria,Secondary to #1  -Her initial 08/20/20 labs showed AST 349, ALT 473, alk phos 903 and tbili 7.3. She only had Jaundice of skin and eyes. Otherwise asymptomatic.  -Plastic Stent placed by ERCP on 08/22/20 with Dr Watt Climes. May need stent exchange in the future. -Jaundice resolved,T bili normalized, AST/ALT and alk phos fluctuate on treatment.Stable.   4. HTN, Hyperglycemia  -During recent hospitalization for pancreatic cancer her 08/20/20 A1c was 7.1  -On Zocor, Clonidine and HCTZ for HTN.HCTZ held from start of chemo due to hypotension.  -Continue to F/u with PCP  5. Financial assistance and Social support, Depression -She notes concern about continuing work when she can for money. She works for [employer] from home. Her husband recently retired but may return to work in near future.  -increased celexa to 20 mg daily recently. Improved and stable.   6. 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 Proceed with GCSF at home injection on day 3.  -CT AP on 1/14 scheduled, will review in tumor board after scan  -Labs, flush, F/u and FOLFIRINOX in2 and 4 weeks   No problem-specific Assessment & Plan notes found for this encounter.   Orders Placed This Encounter  Procedures  . Amb  Referral to Nutrition and Diabetic E    Referral Priority:   Urgent    Referral Type:   Consultation    Referral Reason:   Specialty Services Required    Number of Visits Requested:   1   All questions were answered. The patient knows to call the clinic with any problems, questions or concerns. No barriers to learning was  detected. The total time spent in the appointment was 30 minutes.     Truitt Merle, MD 11/21/2020   I, Joslyn Devon, am acting as scribe for Truitt Merle, MD.   I have reviewed the above documentation for accuracy and completeness, and I agree with the above.

## 2020-11-22 ENCOUNTER — Telehealth: Payer: Self-pay

## 2020-11-22 LAB — CANCER ANTIGEN 19-9: CA 19-9: 11 U/mL (ref 0–35)

## 2020-11-22 NOTE — Telephone Encounter (Signed)
Nutrition  Urgent Referral made at the request of Dr Mosetta Putt.    RD called patient but no answer.  Left message with call back number.    Patient has been scheduled with nutrition on 1/17 during next infusion.  Cybil [place] B. Freida Busman, RD, LDN Registered Dietitian [phone] (mobile)

## 2020-11-23 ENCOUNTER — Other Ambulatory Visit: Payer: Self-pay

## 2020-11-23 ENCOUNTER — Telehealth: Payer: Self-pay

## 2020-11-23 ENCOUNTER — Inpatient Hospital Stay: Payer: BC Managed Care – PPO

## 2020-11-23 VITALS — BP 153/79 | HR 70 | Temp 98.4°F | Resp 16

## 2020-11-23 DIAGNOSIS — Z5111 Encounter for antineoplastic chemotherapy: Secondary | ICD-10-CM | POA: Diagnosis not present

## 2020-11-23 DIAGNOSIS — C250 Malignant neoplasm of head of pancreas: Secondary | ICD-10-CM

## 2020-11-23 MED ORDER — SODIUM CHLORIDE 0.9% FLUSH
10.0000 mL | INTRAVENOUS | Status: DC | PRN
  Administered 2020-11-23: 10 mL
  Filled 2020-11-23: qty 10

## 2020-11-23 MED ORDER — HEPARIN SOD (PORK) LOCK FLUSH 100 UNIT/ML IV SOLN
500.0000 [IU] | Freq: Once | INTRAVENOUS | Status: AC | PRN
  Administered 2020-11-23: 500 [IU]
  Filled 2020-11-23: qty 5

## 2020-11-23 NOTE — Patient Instructions (Signed)

## 2020-11-23 NOTE — Telephone Encounter (Signed)
Nutrition Assessment   Reason for Assessment:   Urgent referral from Dr Mosetta Putt, weight loss   ASSESSMENT:  60 year old female with pancreatic cancer.  Patient receiving neoadjuvant folfirinox q 2 weeks.  Past medical history of DM, HTN, high cholesterol.   Patient returned RD's call.  Patient reports that she is concerned about weight loss.  Reports taste is "off".  Having issues with diarrhea as well.  Reports that eggs, peanut butter give her diarrhea.  She has been taking imodium.  Has tried ensure and feels it causes diarrhea.  Ate chicken noodle soup for breakfast this am.  Last night for dinner ate salmon and baked potato with butter and sour cream.      Medications: imodium, zofran, compazine, prilosec   Labs: K 3.1   Anthropometrics:   Height: 62 inches Weight: 141 lb 1.6 oz UBW: 160 lb before treatment started 10/6 noted 155 lb BMI: 25  9% weight loss in the last 3 months, significant   NUTRITION DIAGNOSIS: Inadequate oral intake related to cancer related side effects from treatment as evidenced by 9% weight loss in the last 3 months   INTERVENTION:  Discussed strategies to help with taste change.  Will provide handout for patient to pick up today (coming for pump removal) Encouraged use of imodium to help with diarrhea. Discussed foods as well Will provide samples of boost, orgain, boost breeze and soothe for patient to try. She will pick them up today at front desk of cancer center. Contact information provided   MONITORING, EVALUATION, GOAL: weight trends, intake   Next Visit: Jan 17 during infusion  Dustine Bertini B. Freida Busman, RD, LDN Registered Dietitian [phone] (mobile)

## 2020-11-30 ENCOUNTER — Encounter: Payer: Self-pay | Admitting: Hematology

## 2020-11-30 NOTE — Progress Notes (Signed)
Enrolled patient in copay assistance for treatment drug copays after insurance pays on behalf of patient after receiving her permission.  Patient approved for $4500 towards copay assistance. Obtained signature from physician on form and faxed back to [facility]. Fax received ok per confirmation sheet.  A copy of the approval letter given to [facility] for copay/billing claim submissions.  The patient will receive a welcome packet in the mail via mail for her records only.  She has my card for any additional financial questions or concerns.

## 2020-12-02 ENCOUNTER — Other Ambulatory Visit: Payer: Self-pay

## 2020-12-02 ENCOUNTER — Ambulatory Visit (HOSPITAL_COMMUNITY)
Admission: RE | Admit: 2020-12-02 | Discharge: 2020-12-02 | Disposition: A | Discharge status: Home / Self Care | Payer: BC Managed Care – PPO | Source: Ambulatory Visit | Attending: Nurse Practitioner | Admitting: Nurse Practitioner

## 2020-12-02 ENCOUNTER — Telehealth: Payer: Self-pay | Admitting: Nurse Practitioner

## 2020-12-02 ENCOUNTER — Encounter (HOSPITAL_COMMUNITY): Payer: Self-pay

## 2020-12-02 DIAGNOSIS — C250 Malignant neoplasm of head of pancreas: Secondary | ICD-10-CM | POA: Insufficient documentation

## 2020-12-02 MED ORDER — IOHEXOL 300 MG/ML  SOLN
100.0000 mL | Freq: Once | INTRAMUSCULAR | Status: AC | PRN
  Administered 2020-12-02: 100 mL via INTRAVENOUS

## 2020-12-02 NOTE — Telephone Encounter (Signed)
R/s appointments per weather. Spoke to patient who is aware of updated appointments date and time.

## 2020-12-05 ENCOUNTER — Inpatient Hospital Stay: Payer: BC Managed Care – PPO | Admitting: Nurse Practitioner

## 2020-12-05 ENCOUNTER — Inpatient Hospital Stay: Payer: BC Managed Care – PPO | Admitting: Nutrition

## 2020-12-05 ENCOUNTER — Inpatient Hospital Stay: Payer: BC Managed Care – PPO

## 2020-12-07 ENCOUNTER — Ambulatory Visit: Payer: BC Managed Care – PPO | Admitting: Nurse Practitioner

## 2020-12-07 ENCOUNTER — Other Ambulatory Visit: Payer: BC Managed Care – PPO

## 2020-12-07 ENCOUNTER — Other Ambulatory Visit: Payer: Self-pay

## 2020-12-07 NOTE — Progress Notes (Addendum)
[place]   [phone] Fax:([phone]) [phone]   Clinic Follow up Note   Patient Care Team: Nicola Girt, DO as PCP - General (Internal Medicine) Jonnie Finner, RN as Oncology Nurse Navigator Truitt Merle, MD as Consulting Physician (Oncology) Dwan Bolt, MD as Consulting Physician (General Surgery) 12/08/2020  CHIEF COMPLAINT: Follow-up pancreas cancer  SUMMARY OF ONCOLOGIC HISTORY: Oncology History Overview Note  Cancer Staging Pancreatic cancer [facility]) Staging form: Exocrine Pancreas, AJCC 8th Edition - Clinical stage from 08/24/2020: Stage IIB (cT2, cN1, cM0) - Signed by Truitt Merle, MD on 08/30/2020    Pancreatic cancer ([place])  08/20/2020 Imaging   CT AP 08/20/20  IMPRESSION: 1. 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 Collateral circulation is noted. There is moderate intrahepatic and extrahepatic biliary dilatation which appears to be due to the pancreatic head mass. Pancreatic ductal dilatation is noted as well. 2. Probable 2.7 cm uterine fibroid. 3. Aortic atherosclerosis.   Aortic Atherosclerosis (ICD10-I70.0).   08/20/2020 Tumor Marker   Ca19-9 - 927   08/22/2020 Procedure   ERCP by Dr Watt Climes 08/22/20  IMPRESSION - The major papilla appeared normal. - A biliary sphincterotomy was performed. - Cells for cytology obtained in the lower third and middle of the main duct. - One plastic stent was placed into the common bile duct.   FINAL MICROSCOPIC DIAGNOSIS:  - No malignant cells identified  - Benign reactive/reparative changes   08/24/2020 Cancer Staging   Staging form: Exocrine Pancreas, AJCC 8th Edition - Clinical stage from 08/24/2020: Stage IIB (cT2, cN1, cM0) - Signed by Truitt Merle, MD on 08/30/2020   08/24/2020 Procedure   EUS by Dr Paulita Fujita  08/24/20  IMPRESSION - There was no sign of significant pathology in the ampulla. - A few malignant-appearing lymph nodes were visualized in the peripancreatic region and porta hepatis region. - One stent was visualized endosonographically in the common bile duct. - A mass was identified in the pancreatic head. Tissue was obtained from this exam. The preliminary diagnosis is consistent with adenocarcinoma. Invasion into SMV/PV seen. Lymphadenopathy noted. This was staged T3 N1 Mx by endosonographic criteria. Fine needle aspiration performed.   08/24/2020 Initial Biopsy   FINAL MICROSCOPIC DIAGNOSIS: 08/24/20 - Malignant cells consistent with adenocarcinoma    08/30/2020 Initial Diagnosis   Pancreatic cancer ([place])   09/05/2020 Imaging   CT Chest  IMPRESSION: 1. Stable 2.7 cm infiltrating pancreatic head mass with borderline enlarged peripancreatic lymph nodes. 2. No findings for pulmonary metastatic disease. 3. Small hiatal hernia. 4. Aortic atherosclerosis.   Aortic Atherosclerosis (ICD10-I70.0).   09/08/2020 Procedure   INSERTION PORT-A-CATH by Dr Zenia Resides and Barry Dienes    09/12/2020 -  Chemotherapy   Neoadjuvant FOLFIRINOX q2weeks starting 09/12/20    12/02/2020 Imaging   IMPRESSION: 1. Previously noted mass of the central pancreatic head is almost entirely resolved, difficult to discretely appreciate on current examination. Findings are consistent with treatment response. There remains obstruction of the pancreatic duct near the head neck junction with mild prominence of the pancreatic duct, measuring up to 5 mm, with atrophy of the distal pancreatic parenchyma. 2. The portal vein, splenic vein, and superior mesenteric vein are now widely patent, previously effaced at the confluence by mass effect. 3. Interval placement of common bile duct stent, tip positioned in  the distal duodenum, with relief of previously seen biliary ductal dilatation. 4. 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 Following the bifurcation of the celiac mesenteric trunk, the celiac axis appears to directly traverse the vicinity of the mass, although there does appear to be a fat plane about the vessel. 5. The distal small bowel and colon are diffusely somewhat hyperenhancing and inflamed appearing with vascular combing and a tethered appearance of the distal small bowel. This appearance generally suggests inflammatory bowel disease such as Crohn's disease. Correlate with referable clinical history, if present. No evidence of obstruction or other acute complication. 6. Hepatic steatosis. 7. Aortic atherosclerosis.     CURRENT THERAPY: Neoadjuvant FOLFIRINOX q2 weeks starting 09/12/20  INTERVAL HISTORY: Ms. Freese returns for follow-up and treatment as scheduled.  She completed 6 cycles of neoadjuvant chemo, last given 11/21/2020. She had more diarrhea, now once daily rather than periodic but still managed with imodium. Takes K TID. She had more nausea but no vomiting, meds are helpful. Tongue feels raw but no obvious sores, able to eat and drink. Cold sensitivity lasts longer, neuropathy is stable, able to function. She feels it took 2 weeks to recover.  Denies pain, fever, chills, cough, chest pain, dyspnea, leg edema, or new concerns.    MEDICAL HISTORY:  Past Medical History:  Diagnosis Date  . Anxiety   . Cancer ([place]) 07/2020   pancreatic cancer  . Depression   . Diabetes mellitus without complication ([place]) 56/4332   pancreatic cancer  . High cholesterol   . Hypertension     SURGICAL HISTORY: Past Surgical History:  Procedure Laterality Date  . BILIARY BRUSHING  08/22/2020   Procedure: BILIARY BRUSHING;  Surgeon: Clarene Essex, MD;  Location: [facility];  Service: Endoscopy;;  . BILIARY STENT PLACEMENT N/A 08/22/2020    Procedure: BILIARY STENT PLACEMENT;  Surgeon: Clarene Essex, MD;  Location: [facility];  Service: Endoscopy;  Laterality: N/A;  . ENDOSCOPIC RETROGRADE CHOLANGIOPANCREATOGRAPHY (ERCP) WITH PROPOFOL N/A 08/22/2020   Procedure: ENDOSCOPIC RETROGRADE CHOLANGIOPANCREATOGRAPHY (ERCP) WITH PROPOFOL;  Surgeon: Clarene Essex, MD;  Location: [facility];  Service: Endoscopy;  Laterality: N/A;  . ESOPHAGOGASTRODUODENOSCOPY (EGD) WITH PROPOFOL N/A 08/24/2020   Procedure: ESOPHAGOGASTRODUODENOSCOPY (EGD) WITH PROPOFOL;  Surgeon: Arta Silence, MD;  Location: [facility];  Service: Endoscopy;  Laterality: N/A;  . FINE NEEDLE ASPIRATION N/A 08/24/2020   Procedure: FINE NEEDLE ASPIRATION (FNA) LINEAR;  Surgeon: Arta Silence, MD;  Location: [facility];  Service: Endoscopy;  Laterality: N/A;  . PORTACATH PLACEMENT Right 09/08/2020   Procedure: INSERTION PORT-A-CATH;  Surgeon: Dwan Bolt, MD;  Location: [facility];  Service: General;  Laterality: Right;  . SPHINCTEROTOMY  08/22/2020   Procedure: SPHINCTEROTOMY;  Surgeon: Clarene Essex, MD;  Location: [facility];  Service: Endoscopy;;  . UPPER ESOPHAGEAL ENDOSCOPIC ULTRASOUND (EUS) N/A 08/24/2020   Procedure: UPPER ESOPHAGEAL ENDOSCOPIC ULTRASOUND (EUS);  Surgeon: Arta Silence, MD;  Location: [facility];  Service: Endoscopy;  Laterality: N/A;    I have reviewed the social history and family history with the patient and they are unchanged from previous note.  ALLERGIES:  is allergic to oxycodone-acetaminophen, poison ivy extract, and tyloxapol.  MEDICATIONS:  Current Outpatient Medications  Medication Sig Dispense Refill  . magnesium oxide (MAG-OX) 400 (241.3 Mg) MG tablet Take 1 tablet (400 mg total) by mouth 2 (two) times daily. 60 tablet  0  . acetaminophen (TYLENOL) 325 MG tablet Take 650 mg by mouth every 6 (six) hours as needed.    . citalopram (CELEXA) 40 MG tablet Take 10 mg by mouth daily.     . cloNIDine (CATAPRES) 0.1 MG  tablet Take 0.1 mg by mouth at bedtime.    . hydrochlorothiazide (HYDRODIURIL) 12.5 MG tablet Take 12.5 mg by mouth daily.    Marland Kitchen lidocaine-prilocaine (EMLA) cream Apply to affected area once 30 g 3  . omeprazole (PRILOSEC) 20 MG capsule Take 20 mg by mouth daily.    . ondansetron (ZOFRAN) 8 MG tablet Take 1 tablet (8 mg total) by mouth 2 (two) times daily as needed. Start on day 3 after chemotherapy. 30 tablet 1  . pegfilgrastim-bmez (ZIEXTENZO) 6 MG/0.6ML injection Inject 6 mg into the skin every 14 (fourteen) days.    . potassium chloride (KLOR-CON) 10 MEQ tablet Take 1 tablet (10 mEq total) by mouth 3 (three) times daily. 90 tablet 0  . prochlorperazine (COMPAZINE) 10 MG tablet Take 1 tablet (10 mg total) by mouth every 6 (six) hours as needed (Nausea or vomiting). 30 tablet 1  . simvastatin (ZOCOR) 80 MG tablet Take 80 mg by mouth daily.     Current Facility-Administered Medications  Medication Dose Route Frequency Provider Last Rate Last Admin  . magnesium sulfate IVPB 2 g 50 mL  2 g Intravenous Once Alla Feeling, NP       Facility-Administered Medications Ordered in Other Visits  Medication Dose Route Frequency Provider Last Rate Last Admin  . atropine injection 0.5 mg  0.5 mg Intravenous Once PRN Truitt Merle, MD        PHYSICAL EXAMINATION: ECOG PERFORMANCE STATUS: 1-2  Vitals:   12/08/20 1131  BP: 137/82  Pulse: 94  Resp: 18  Temp: 98.1 F (36.7 C)  SpO2: 100%   Filed Weights   12/08/20 1131  Weight: 137 lb 11.2 oz (62.5 kg)    GENERAL:alert, no distress and comfortable SKIN: no rash  EYES: sclera clear LUNGS:  normal breathing effort HEART: no lower extremity edema ABDOMEN:abdomen soft, non-tender and normal bowel sounds NEURO: alert & oriented x 3 with fluent speech, no focal motor/sensory deficits PAC without erythema    LABORATORY DATA:  I have reviewed the data as listed CBC Latest Ref Rng & Units 12/08/2020 11/21/2020 11/07/2020  WBC 4.0 - 10.5 K/uL 12.0(H)  15.9(H) 7.6  Hemoglobin 12.0 - 15.0 g/dL 10.0(L) 11.0(L) 10.7(L)  Hematocrit 36.0 - 46.0 % 30.2(L) 33.6(L) 32.9(L)  Platelets 150 - 400 K/uL 192 190 112(L)     CMP Latest Ref Rng & Units 12/08/2020 11/21/2020 11/07/2020  Glucose 70 - 99 mg/dL 137(H) 102(H) 119(H)  BUN 6 - 20 mg/dL $Remove'7 8 7  'VfbEBQV$ Creatinine 0.44 - 1.00 mg/dL 0.79 0.82 0.73  Sodium 135 - 145 mmol/L 141 141 140  Potassium 3.5 - 5.1 mmol/L 3.1(L) 3.1(L) 3.0(L)  Chloride 98 - 111 mmol/L 110 109 108  CO2 22 - 32 mmol/L $RemoveB'22 23 23  'GMeLpKuP$ Calcium 8.9 - 10.3 mg/dL 7.9(L) 8.6(L) 8.5(L)  Total Protein 6.5 - 8.1 g/dL 5.7(L) 6.2(L) 6.0(L)  Total Bilirubin 0.3 - 1.2 mg/dL 0.4 0.4 0.4  Alkaline Phos 38 - 126 U/L 363(H) 373(H) 526(H)  AST 15 - 41 U/L 65(H) 49(H) 48(H)  ALT 0 - 44 U/L 50(H) 44 101(H)      RADIOGRAPHIC STUDIES: I have personally reviewed the radiological images as listed and agreed with the findings in the report. No results  found.   ASSESSMENT & PLAN: Aquila Menzie Coxis a 60 y.o.femalewith    1. 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 CTdid not show other malignancy. -She was seen bysurgeon Dr Zenia Resides on 09/06/20,neoadjuvantchemo recommended -Due to the borderline resectable disease, shestarted neoadjuvant chemotherapy withFOLFIRINOX q2weekson 10/25/21to downstage her borderline resectable disease for3-20months before surgery with possible additional adjuvant chemoafter surgery. Will scan her after 6 cycles oftreatment to monitor her response. -continue GCSF at home injection on day 3 given neutropenia. Continue with each cycle.  -Restaging CT 12/02/20 after C6 which shows good treatment response, the pancreatic mass  is smaller, and vascular involvement has improved. The case was reviewed in tumor board.  -the recommendation is to proceed with additional 2 cycles chemo and refer to Dr. Zenia Resides.  -Due to her personal history of pancreas cancer, she qualifies for genetic testing. She is interested and I referred her.   2. Diarrhea, Weight loss, Hypokalemia -S/p C2 she has had loose BM twice a day which has lead to a BM accident.  -Has persistent hypokalemia, takes 10 mEq TID. Will check Mag  -diarrhea became more frequent after C6, will reduce irinotecan and continue imodium  3. Obstructive Jaundice, Transaminitis, Hyperbilirubinuria,Secondary to #1  -Her initial 08/20/20 labs showed AST 349, ALT 473, alk phos 903 and tbili 7.3.  -Plastic Stent placed by ERCP on 08/22/20 with Dr Watt Climes. May need stent exchange in the future. -T bili normalized, AST/ALT and alk phos fluctuate on treatment  4. HTN, Hyperglycemia  -During recent hospitalization for pancreatic cancer her 08/20/20 A1c was 7.1  -On Zocor, Clonidine and HCTZ for HTN.HCTZ held from start of chemo due to hypotension.  -f/up per PCP   5. Financial assistance and Social support, Depression -She notes concern about continuing work when she can for money. She works for [employer] from home. Her husband recently retired but may return to work in near future.  -increased celexa to 20 mg daily recently. Improved   6. Neutropenia -resolved with GCSF on day 3 home injection  Dispo:  Ms. Bobb appears stable. She completed 6 cycles neoadjuvant mFOLFIRINOX. She tolerates treatment moderately well, with progressive nausea, diarrhea, and cold sensitivty. She required 2 weeks to recover but eventually able to function well.   I personally reviewed her restaging CT which shows good treatment response, pancreatic mass is smaller, and vascular involvement has improved. CA 19-9 has normalized which is consistent with CT. The recommendation is to proceed  with additional 2 cycles (total 8 cycles) and refer back to Dr. Zenia Resides.   Labs reviewed, CBC and CMP stable. K. 3.1, Mg 1.3; she will get 2 g IV Mg today with chemo and begin oral mag-ox BID, continue oral K TID. Electrolyte imbalance all likely secondary to GI output.   She will proceed with C7 mFOLFIRINOX, with 10% irinotecan and oxali dose reductions. GCSF on day 3. Will change zofran to aloxi. F/up and final cycle 8 in 2 weeks.   She qualifies for genetics due to her personal h/o pancreatic cancer, she is interested and I referred her.   Plan reviewed with Dr. Burr Medico.     Orders Placed This Encounter  Procedures  . Magnesium    Standing Status:   Future    Number of Occurrences:   1  Standing Expiration Date:   12/08/2021  . Ambulatory referral to General Surgery    Referral Priority:   Routine    Referral Type:   Surgical    Referral Reason:   Specialty Services Required    Referred to Provider:   Dwan Bolt, MD    Requested Specialty:   General Surgery    Number of Visits Requested:   1  . Ambulatory referral to Genetics    Referral Priority:   Routine    Referral Type:   Consultation    Referral Reason:   Specialty Services Required    Number of Visits Requested:   1   All questions were answered. The patient knows to call the clinic with any problems, questions or concerns. No barriers to learning were detected. Total encounter time was 30 minutes.      Alla Feeling, NP 12/08/20

## 2020-12-07 NOTE — Progress Notes (Signed)
The proposed treatment discussed in conference is for discussion purposes only and is not a binding recommendation.  The patients have not been physically examined, or presented with their treatment options.  Therefore, final treatment plans cannot be decided.   

## 2020-12-08 ENCOUNTER — Inpatient Hospital Stay: Payer: BC Managed Care – PPO

## 2020-12-08 ENCOUNTER — Other Ambulatory Visit: Payer: Self-pay

## 2020-12-08 ENCOUNTER — Inpatient Hospital Stay (HOSPITAL_BASED_OUTPATIENT_CLINIC_OR_DEPARTMENT_OTHER): Payer: BC Managed Care – PPO | Admitting: Nurse Practitioner

## 2020-12-08 ENCOUNTER — Encounter: Payer: Self-pay | Admitting: Nurse Practitioner

## 2020-12-08 VITALS — BP 137/82 | HR 94 | Temp 98.1°F | Resp 18 | Ht 62.0 in | Wt 137.7 lb

## 2020-12-08 DIAGNOSIS — Z5111 Encounter for antineoplastic chemotherapy: Secondary | ICD-10-CM | POA: Diagnosis not present

## 2020-12-08 DIAGNOSIS — C250 Malignant neoplasm of head of pancreas: Secondary | ICD-10-CM

## 2020-12-08 DIAGNOSIS — Z95828 Presence of other vascular implants and grafts: Secondary | ICD-10-CM

## 2020-12-08 LAB — CMP (CANCER CENTER ONLY)
ALT: 50 U/L — ABNORMAL HIGH (ref 0–44)
AST: 65 U/L — ABNORMAL HIGH (ref 15–41)
Albumin: 3.1 g/dL — ABNORMAL LOW (ref 3.5–5.0)
Alkaline Phosphatase: 363 U/L — ABNORMAL HIGH (ref 38–126)
Anion gap: 9 (ref 5–15)
BUN: 7 mg/dL (ref 6–20)
CO2: 22 mmol/L (ref 22–32)
Calcium: 7.9 mg/dL — ABNORMAL LOW (ref 8.9–10.3)
Chloride: 110 mmol/L (ref 98–111)
Creatinine: 0.79 mg/dL (ref 0.44–1.00)
GFR, Estimated: >60 mL/min (ref >60)
Glucose, Bld: 137 mg/dL — ABNORMAL HIGH (ref 70–99)
Potassium: 3.1 mmol/L — ABNORMAL LOW (ref 3.5–5.1)
Sodium: 141 mmol/L (ref 135–145)
Total Bilirubin: 0.4 mg/dL (ref 0.3–1.2)
Total Protein: 5.7 g/dL — ABNORMAL LOW (ref 6.5–8.1)

## 2020-12-08 LAB — CBC WITH DIFFERENTIAL (CANCER CENTER ONLY)
Abs Immature Granulocytes: 1.06 10*3/uL — ABNORMAL HIGH (ref 0.00–0.07)
Basophils Absolute: 0 10*3/uL (ref 0.0–0.1)
Basophils Relative: 0 %
Eosinophils Absolute: 0 10*3/uL (ref 0.0–0.5)
Eosinophils Relative: 0 %
HCT: 30.2 % — ABNORMAL LOW (ref 36.0–46.0)
Hemoglobin: 10 g/dL — ABNORMAL LOW (ref 12.0–15.0)
Immature Granulocytes: 9 %
Lymphocytes Relative: 14 %
Lymphs Abs: 1.7 10*3/uL (ref 0.7–4.0)
MCH: 29 pg (ref 26.0–34.0)
MCHC: 33.1 g/dL (ref 30.0–36.0)
MCV: 87.5 fL (ref 80.0–100.0)
Monocytes Absolute: 1.5 10*3/uL — ABNORMAL HIGH (ref 0.1–1.0)
Monocytes Relative: 13 %
Neutro Abs: 7.7 10*3/uL (ref 1.7–7.7)
Neutrophils Relative %: 64 %
Platelet Count: 192 10*3/uL (ref 150–400)
RBC: 3.45 MIL/uL — ABNORMAL LOW (ref 3.87–5.11)
RDW: 22 % — ABNORMAL HIGH (ref 11.5–15.5)
WBC Count: 12 10*3/uL — ABNORMAL HIGH (ref 4.0–10.5)
nRBC: 0.3 % — ABNORMAL HIGH (ref 0.0–0.2)

## 2020-12-08 LAB — MAGNESIUM: Magnesium: 1.3 mg/dL — ABNORMAL LOW (ref 1.7–2.4)

## 2020-12-08 MED ORDER — OXALIPLATIN CHEMO INJECTION 100 MG/20ML
65.0000 mg/m2 | Freq: Once | INTRAVENOUS | Status: AC
  Administered 2020-12-08: 115 mg via INTRAVENOUS
  Filled 2020-12-08: qty 23

## 2020-12-08 MED ORDER — MAGNESIUM SULFATE 2 GM/50ML IV SOLN: 2.0000 g | Freq: Once | INTRAVENOUS | Status: DC

## 2020-12-08 MED ORDER — POTASSIUM CHLORIDE ER 10 MEQ PO TBCR: 10.0000 meq | EXTENDED_RELEASE_TABLET | Freq: Three times a day (TID) | ORAL | 0 refills | Status: DC

## 2020-12-08 MED ORDER — SODIUM CHLORIDE 0.9 % IV SOLN
2400.0000 mg/m2 | INTRAVENOUS | Status: DC
  Administered 2020-12-08: 4200 mg via INTRAVENOUS
  Filled 2020-12-08: qty 84

## 2020-12-08 MED ORDER — LORAZEPAM 2 MG/ML IJ SOLN
0.5000 mg | Freq: Once | INTRAMUSCULAR | Status: AC
  Administered 2020-12-08: 0.5 mg via INTRAVENOUS

## 2020-12-08 MED ORDER — MAGNESIUM SULFATE 2 GM/50ML IV SOLN
2.0000 g | Freq: Once | INTRAVENOUS | Status: AC
  Administered 2020-12-08: 2 g via INTRAVENOUS

## 2020-12-08 MED ORDER — ATROPINE SULFATE 1 MG/ML IJ SOLN
0.5000 mg | Freq: Once | INTRAMUSCULAR | Status: AC | PRN
  Administered 2020-12-08: 0.5 mg via INTRAVENOUS

## 2020-12-08 MED ORDER — MAGNESIUM SULFATE 2 GM/50ML IV SOLN
INTRAVENOUS | Status: AC
  Filled 2020-12-08: qty 50

## 2020-12-08 MED ORDER — DEXTROSE 5 % IV SOLN
Freq: Once | INTRAVENOUS | Status: AC
  Filled 2020-12-08: qty 250

## 2020-12-08 MED ORDER — DEXAMETHASONE SODIUM PHOSPHATE 10 MG/ML IJ SOLN
INTRAMUSCULAR | Status: AC
  Filled 2020-12-08: qty 1

## 2020-12-08 MED ORDER — FAMOTIDINE IN NACL 20-0.9 MG/50ML-% IV SOLN
20.0000 mg | Freq: Once | INTRAVENOUS | Status: AC
  Administered 2020-12-08: 20 mg via INTRAVENOUS

## 2020-12-08 MED ORDER — ATROPINE SULFATE 1 MG/ML IJ SOLN
INTRAMUSCULAR | Status: AC
  Filled 2020-12-08: qty 1

## 2020-12-08 MED ORDER — MAGNESIUM OXIDE 400 (241.3 MG) MG PO TABS: 400.0000 mg | ORAL_TABLET | Freq: Two times a day (BID) | ORAL | 0 refills | Status: DC

## 2020-12-08 MED ORDER — SODIUM CHLORIDE 0.9 % IV SOLN
400.0000 mg/m2 | Freq: Once | INTRAVENOUS | Status: AC
  Administered 2020-12-08: 704 mg via INTRAVENOUS
  Filled 2020-12-08: qty 35.2

## 2020-12-08 MED ORDER — SODIUM CHLORIDE 0.9% FLUSH
10.0000 mL | INTRAVENOUS | Status: DC | PRN
  Administered 2020-12-08: 10 mL
  Filled 2020-12-08: qty 10

## 2020-12-08 MED ORDER — PALONOSETRON HCL INJECTION 0.25 MG/5ML
0.2500 mg | Freq: Once | INTRAVENOUS | Status: AC
  Administered 2020-12-08: 0.25 mg via INTRAVENOUS

## 2020-12-08 MED ORDER — DEXAMETHASONE SODIUM PHOSPHATE 10 MG/ML IJ SOLN
8.0000 mg | Freq: Once | INTRAMUSCULAR | Status: AC
  Administered 2020-12-08: 8 mg via INTRAVENOUS

## 2020-12-08 MED ORDER — LORAZEPAM 2 MG/ML IJ SOLN
INTRAMUSCULAR | Status: AC
  Filled 2020-12-08: qty 1

## 2020-12-08 MED ORDER — FAMOTIDINE IN NACL 20-0.9 MG/50ML-% IV SOLN
INTRAVENOUS | Status: AC
  Filled 2020-12-08: qty 50

## 2020-12-08 MED ORDER — PALONOSETRON HCL INJECTION 0.25 MG/5ML
INTRAVENOUS | Status: AC
  Filled 2020-12-08: qty 5

## 2020-12-08 MED ORDER — SODIUM CHLORIDE 0.9 % IV SOLN
150.0000 mg | Freq: Once | INTRAVENOUS | Status: AC
  Administered 2020-12-08: 150 mg via INTRAVENOUS
  Filled 2020-12-08: qty 150

## 2020-12-08 MED ORDER — SODIUM CHLORIDE 0.9 % IV SOLN
135.0000 mg/m2 | Freq: Once | INTRAVENOUS | Status: AC
  Administered 2020-12-08: 240 mg via INTRAVENOUS
  Filled 2020-12-08: qty 12

## 2020-12-08 NOTE — Addendum Note (Signed)
Addended by: Tora Kindred on: 12/08/2020 02:44 PM   Modules accepted: Orders

## 2020-12-08 NOTE — Addendum Note (Signed)
Addended by: Alla Feeling on: 12/08/2020 01:43 PM   Modules accepted: Orders

## 2020-12-08 NOTE — Patient Instructions (Signed)
[facility] Discharge Instructions for Patients Receiving Chemotherapy  Today you received the following chemotherapy agents: Oxaliplatin, irinotecan, leucovorin, 5FU  To help prevent nausea and vomiting after your treatment, we encourage you to take your nausea medication as directed.   If you develop nausea and vomiting that is not controlled by your nausea medication, call the clinic.   BELOW ARE SYMPTOMS THAT SHOULD BE REPORTED IMMEDIATELY:  *FEVER GREATER THAN 100.5 F  *CHILLS WITH OR WITHOUT FEVER  NAUSEA AND VOMITING THAT IS NOT CONTROLLED WITH YOUR NAUSEA MEDICATION  *UNUSUAL SHORTNESS OF BREATH  *UNUSUAL BRUISING OR BLEEDING  TENDERNESS IN MOUTH AND THROAT WITH OR WITHOUT PRESENCE OF ULCERS  *URINARY PROBLEMS  *BOWEL PROBLEMS  UNUSUAL RASH Items with * indicate a potential emergency and should be followed up as soon as possible.  Feel free to call the clinic should you have any questions or concerns. The clinic phone number is [phone].  Please show the [name] at check-in to the Emergency Department and triage nurse.

## 2020-12-09 ENCOUNTER — Telehealth: Payer: Self-pay | Admitting: Genetic Counselor

## 2020-12-09 NOTE — Telephone Encounter (Signed)
Scheduled appointment per 1/19 sch msg. Spoke to patient who is aware of appointment date and time. Patient also requested a phone visit instead of a [facility] visit.

## 2020-12-10 ENCOUNTER — Inpatient Hospital Stay: Payer: BC Managed Care – PPO

## 2020-12-10 ENCOUNTER — Other Ambulatory Visit: Payer: Self-pay

## 2020-12-10 VITALS — BP 125/61 | HR 67 | Temp 98.7°F | Resp 17

## 2020-12-10 DIAGNOSIS — C250 Malignant neoplasm of head of pancreas: Secondary | ICD-10-CM

## 2020-12-10 MED ORDER — SODIUM CHLORIDE 0.9% FLUSH
10.0000 mL | INTRAVENOUS | Status: DC | PRN
  Filled 2020-12-10: qty 10

## 2020-12-10 MED ORDER — HEPARIN SOD (PORK) LOCK FLUSH 100 UNIT/ML IV SOLN
500.0000 [IU] | Freq: Once | INTRAVENOUS | Status: DC | PRN
  Filled 2020-12-10: qty 5

## 2020-12-10 NOTE — Patient Instructions (Signed)

## 2020-12-12 ENCOUNTER — Telehealth: Payer: Self-pay | Admitting: Nurse Practitioner

## 2020-12-12 NOTE — Telephone Encounter (Signed)
R/s 1/31 appt per 1/20 los. Pt confirmed new appt date and time.

## 2020-12-14 ENCOUNTER — Other Ambulatory Visit: Payer: Self-pay

## 2020-12-14 DIAGNOSIS — C250 Malignant neoplasm of head of pancreas: Secondary | ICD-10-CM

## 2020-12-14 NOTE — Progress Notes (Signed)
Ov notes, biopsy, imaging reports, ins cards, demographics and referral faxed to [facility] colorectal referrals Dr Crisoforo Oxford at [phone].

## 2020-12-15 ENCOUNTER — Encounter: Payer: BC Managed Care – PPO | Admitting: Genetic Counselor

## 2020-12-15 ENCOUNTER — Inpatient Hospital Stay (HOSPITAL_BASED_OUTPATIENT_CLINIC_OR_DEPARTMENT_OTHER): Payer: BC Managed Care – PPO | Admitting: Genetic Counselor

## 2020-12-15 ENCOUNTER — Other Ambulatory Visit: Payer: Self-pay

## 2020-12-15 DIAGNOSIS — C259 Malignant neoplasm of pancreas, unspecified: Secondary | ICD-10-CM

## 2020-12-15 NOTE — Progress Notes (Unsigned)
REFERRING PROVIDER: Truitt Merle, MD [address] [place],  [place] [address]   PRIMARY PROVIDER:  Nicola Girt, DO  PRIMARY REASON FOR VISIT:  Encounter Diagnosis  Name Primary?  . Malignant neoplasm of pancreas, unspecified location of malignancy ([place]) Yes   I connected with Ms. Doddridge on 12/15/2020 at 1pm EDT by [place] video conference and verified that I am speaking with the correct person using two identifiers.   Patient location: Home Provider location: Home Office  HISTORY OF PRESENT ILLNESS:   Ms. Leonardo, a 60 y.o. female, was seen for a [place] cancer genetics consultation at the request of Dr. Burr Medico due to a personal history of cancer.  Ms. Bettes presents to clinic today to discuss the possibility of a hereditary predisposition to cancer, to discuss genetic testing, and to further clarify her future cancer risks, as well as potential cancer risks for family members.   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  The treatment plan includes neoadjuvant chemotherapy.    CANCER HISTORY:  Oncology History Overview Note  Cancer Staging Pancreatic cancer [facility]) Staging form: Exocrine Pancreas, AJCC 8th Edition - Clinical stage from 08/24/2020: Stage IIB (cT2, cN1, cM0) - Signed by Truitt Merle, MD on 08/30/2020    Pancreatic cancer ([place])  08/20/2020 Imaging   CT AP 08/20/20  IMPRESSION: 1. 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 Collateral circulation is noted. There is moderate intrahepatic and extrahepatic biliary dilatation which appears to be due to the pancreatic head mass. Pancreatic ductal dilatation is noted as well. 2. Probable 2.7 cm uterine fibroid. 3. Aortic atherosclerosis.   Aortic Atherosclerosis (ICD10-I70.0).    08/20/2020 Tumor Marker   Ca19-9 - 927   08/22/2020 Procedure   ERCP by Dr Watt Climes 08/22/20  IMPRESSION - The major papilla appeared normal. - A biliary sphincterotomy was performed. - Cells for cytology obtained in the lower third and middle of the main duct. - One plastic stent was placed into the common bile duct.   FINAL MICROSCOPIC DIAGNOSIS:  - No malignant cells identified  - Benign reactive/reparative changes   08/24/2020 Cancer Staging   Staging form: Exocrine Pancreas, AJCC 8th Edition - Clinical stage from 08/24/2020: Stage IIB (cT2, cN1, cM0) - Signed by Truitt Merle, MD on 08/30/2020   08/24/2020 Procedure   EUS by Dr Paulita Fujita 08/24/20  IMPRESSION - There was no sign of significant pathology in the ampulla. - A few malignant-appearing lymph nodes were visualized in the peripancreatic region and porta hepatis region. - One stent was visualized endosonographically in the common bile duct. - A mass was identified in the pancreatic head. Tissue was obtained from this exam. The preliminary diagnosis is consistent with adenocarcinoma. Invasion into SMV/PV seen. Lymphadenopathy noted. This was staged T3 N1 Mx by endosonographic criteria. Fine needle aspiration performed.   08/24/2020 Initial Biopsy   FINAL MICROSCOPIC DIAGNOSIS: 08/24/20 - Malignant cells consistent with adenocarcinoma    08/30/2020 Initial Diagnosis   Pancreatic cancer ([place])   09/05/2020 Imaging   CT Chest  IMPRESSION: 1. Stable 2.7 cm infiltrating pancreatic head mass with borderline enlarged peripancreatic lymph nodes. 2. No findings for pulmonary metastatic disease. 3. Small hiatal hernia. 4. Aortic atherosclerosis.   Aortic Atherosclerosis (ICD10-I70.0).  09/08/2020 Procedure   INSERTION PORT-A-CATH by Dr Zenia Resides and Barry Dienes    09/12/2020 -  Chemotherapy   Neoadjuvant FOLFIRINOX q2weeks starting 09/12/20    12/02/2020 Imaging   IMPRESSION: 1. Previously noted mass of the central pancreatic head is  almost entirely resolved, difficult to discretely appreciate on current examination. Findings are consistent with treatment response. There remains obstruction of the pancreatic duct near the head neck junction with mild prominence of the pancreatic duct, measuring up to 5 mm, with atrophy of the distal pancreatic parenchyma. 2. The portal vein, splenic vein, and superior mesenteric vein are now widely patent, previously effaced at the confluence by mass effect. 3. Interval placement of common bile duct stent, tip positioned in the distal duodenum, with relief of previously seen biliary ductal dilatation. 4. 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 Following the bifurcation of the celiac mesenteric trunk, the celiac axis appears to directly traverse the vicinity of the mass, although there does appear to be a fat plane about the vessel. 5. The distal small bowel and colon are diffusely somewhat hyperenhancing and inflamed appearing with vascular combing and a tethered appearance of the distal small bowel. This appearance generally suggests inflammatory bowel disease such as Crohn's disease. Correlate with referable clinical history, if present. No evidence of obstruction or other acute complication. 6. Hepatic steatosis. 7. Aortic atherosclerosis.     RISK FACTORS:  Menarche was at age 52 or 68.  First live birth at age 76.  Ovaries intact: yes.  Hysterectomy: no.  Menopausal status: postmenopausal.  HRT use: 0 years. Colonoscopy: yes.  Number of breast biopsies: 0.  Past Medical History:  Diagnosis Date  . Anxiety   . Cancer ([place]) 07/2020   pancreatic cancer  . Depression   . Diabetes mellitus without complication ([place]) 67/3419   pancreatic cancer  . High cholesterol   . Hypertension     Past Surgical  History:  Procedure Laterality Date  . BILIARY BRUSHING  08/22/2020   Procedure: BILIARY BRUSHING;  Surgeon: Clarene Essex, MD;  Location: [facility];  Service: Endoscopy;;  . BILIARY STENT PLACEMENT N/A 08/22/2020   Procedure: BILIARY STENT PLACEMENT;  Surgeon: Clarene Essex, MD;  Location: [facility];  Service: Endoscopy;  Laterality: N/A;  . ENDOSCOPIC RETROGRADE CHOLANGIOPANCREATOGRAPHY (ERCP) WITH PROPOFOL N/A 08/22/2020   Procedure: ENDOSCOPIC RETROGRADE CHOLANGIOPANCREATOGRAPHY (ERCP) WITH PROPOFOL;  Surgeon: Clarene Essex, MD;  Location: [facility];  Service: Endoscopy;  Laterality: N/A;  . ESOPHAGOGASTRODUODENOSCOPY (EGD) WITH PROPOFOL N/A 08/24/2020   Procedure: ESOPHAGOGASTRODUODENOSCOPY (EGD) WITH PROPOFOL;  Surgeon: Arta Silence, MD;  Location: [facility];  Service: Endoscopy;  Laterality: N/A;  . FINE NEEDLE ASPIRATION N/A 08/24/2020   Procedure: FINE NEEDLE ASPIRATION (FNA) LINEAR;  Surgeon: Arta Silence, MD;  Location: [facility];  Service: Endoscopy;  Laterality: N/A;  . PORTACATH PLACEMENT Right 09/08/2020   Procedure: INSERTION PORT-A-CATH;  Surgeon: Dwan Bolt, MD;  Location: [facility];  Service: General;  Laterality: Right;  . SPHINCTEROTOMY  08/22/2020   Procedure: SPHINCTEROTOMY;  Surgeon: Clarene Essex, MD;  Location: [facility];  Service: Endoscopy;;  . UPPER ESOPHAGEAL ENDOSCOPIC ULTRASOUND (EUS) N/A 08/24/2020   Procedure: UPPER ESOPHAGEAL ENDOSCOPIC ULTRASOUND (EUS);  Surgeon: Arta Silence, MD;  Location: [facility];  Service: Endoscopy;  Laterality: N/A;    Social History   Socioeconomic History  . Marital status: Married  Spouse name: Not on file  . Number of children: 1  . Years of education: Not on file  . Highest education level: Not on file  Occupational History  . Occupation: Medical illustrator   Tobacco Use  . Smoking status: Never Smoker  . Smokeless tobacco: Never Used  Vaping Use  . Vaping Use: Never used  Substance  and Sexual Activity  . Alcohol use: Never  . Drug use: Never  . Sexual activity: Not on file  Other Topics Concern  . Not on file  Social History Narrative  . Not on file   Social Determinants of Health   Financial Resource Strain: Not on file  Food Insecurity: Not on file  Transportation Needs: Not on file  Physical Activity: Not on file  Stress: Not on file  Social Connections: Not on file     FAMILY HISTORY:  We obtained a detailed, 4-generation family history.  Significant diagnoses are listed below: Family History  Problem Relation Age of Onset  . Cancer Cousin        paternal cousin; unknown type; dx >50  . Brain Cancer Cousin        paternal cousin    Ms. Mathisen has one son, age 43. Ms. Kister's mother passed away at age 56.  No maternal family history of cancer was reported.  Ms. Deloney's father was 75 years old without a cancer history.  Ms. Vardaman's had two paternal cousins with a history of cancer (unknown, brain cancer).   No other paternal family history of cancer was reported.   Ms. Thaker is unaware of previous family history of genetic testing for hereditary cancer risks. Patient's maternal ancestors are of White/Caucaisan descent, and paternal ancestors are of White/Caucasian descent. There is no reported Ashkenazi Jewish ancestry. There is no known consanguinity.  GENETIC COUNSELING ASSESSMENT: Ms. Cartaya is a 60 y.o. female with a personal history of cancer which is somewhat suggestive of a hereditary cancer syndrome and predisposition to cancer. We, therefore, discussed and recommended the following at today's visit.   DISCUSSION: We discussed that ~10% of pancreatic cancer is hereditary, with most cases of hereditary pancreatic cancers associated with mutations in the BRCA genes.  There are other genes that can be associated with hereditary pancreatic cancer syndromes.  The type of cancer risk and level of cancers risk are gene-specific.  We discussed that testing is beneficial  for several reasons including knowing how to follow individuals after completing their treatment, identifying whether potential treatment options would be beneficial, and understanding if other family members could be at risk for cancer and allowing them to undergo genetic testing.   We reviewed the characteristics, features and inheritance patterns of hereditary cancer syndromes. We also discussed genetic testing, including the appropriate family members to test, the process of testing, insurance coverage and turn-around-time for results. We discussed the implications of a negative, positive, carrier and/or variant of uncertain significant result. We recommended Ms. Hedtke pursue genetic testing for a panel that includes genes associated with pancreatic cancer.   The Common Hereditary Cancers Panel offered by [facility] includes sequencing and/or deletion duplication testing of the following 48 genes: APC, ATM, AXIN2, BARD1, BMPR1A, BRCA1, BRCA2, BRIP1, CDH1, CDK4, CDKN2A (p14ARF), CDKN2A (p16INK4a), CHEK2, CTNNA1, DICER1, EPCAM (Deletion/duplication testing only), GREM1 (promoter region deletion/duplication testing only), KIT, MEN1, MLH1, MSH2, MSH3, MSH6, MUTYH, NBN, NF1, NHTL1, PALB2, PDGFRA, PMS2, POLD1, POLE, PTEN, RAD50, RAD51C, RAD51D, RNF43, SDHB, SDHC, SDHD, SMAD4, SMARCA4. STK11, TP53, TSC1, TSC2, and VHL.  The following genes were evaluated for sequence changes only: SDHA and HOXB13 c.251G>A variant only.  Based on Ms. Graddy's personal history of pancreatic cancer, she meets medical criteria for genetic testing. We  discussed [facility]'s sponsored genetic testing program for those affected with pancreatic cancer. Ms. Nissan was informed that, through this program, [facility] offers testing at no cost to the patient and may elect to share de-identified patient information to third parties and commercial organizations.  After considering the sponsored program and being informed of other cost options, Ms. Lovick elected  to proceed with the genetic test through the sponsored program called [facility] Detect Hereditary Pancreatic Cancer.   PLAN: After considering the risks, benefits, and limitations, Ms. Macaraeg provided informed consent to pursue genetic testing.  A blood draw was scheduled for 12/20/2020 when she will be at the [facility] for other appointments. The blood sample was sent to [facility] for analysis of the Common Hereditary Cancers Panel. Results should be available within approximately 3 weeks' time, at which point they will be disclosed by telephone to Ms. Lilja, as will any additional recommendations warranted by these results. Ms. Golliday will receive a summary of her genetic counseling visit and a copy of her results once available. This information will also be available in Epic.   Lastly, we encouraged Ms. Plotz to remain in contact with cancer genetics annually so that we can continuously update the family history and inform her of any changes in cancer genetics and testing that may be of benefit for this family.   Ms. Schiller's questions were answered to her satisfaction today. Our contact information was provided should additional questions or concerns arise. Thank you for the referral and allowing [place] to share in the care of your patient.   Gwendloyn Forsee M. Joette Catching, [place], [facility] Genetic Counselor [email or web address] (P) [phone]  The patient was seen for a total of 40 minutes in audio and/or video genetic counseling.  Drs. Magrinat, Lindi Adie and/or Burr Medico were available to discuss this case as needed.   _______________________________________________________________________ For Office Staff:  Number of people involved in session: 1 Was an Intern/ student involved with case: no

## 2020-12-16 ENCOUNTER — Encounter: Payer: Self-pay | Admitting: Genetic Counselor

## 2020-12-16 ENCOUNTER — Other Ambulatory Visit: Payer: Self-pay | Admitting: Genetic Counselor

## 2020-12-16 DIAGNOSIS — C259 Malignant neoplasm of pancreas, unspecified: Secondary | ICD-10-CM

## 2020-12-19 ENCOUNTER — Telehealth: Payer: Self-pay | Admitting: *Deleted

## 2020-12-19 ENCOUNTER — Ambulatory Visit: Payer: BC Managed Care – PPO | Admitting: Hematology

## 2020-12-19 ENCOUNTER — Other Ambulatory Visit: Payer: BC Managed Care – PPO

## 2020-12-19 ENCOUNTER — Ambulatory Visit: Payer: BC Managed Care – PPO

## 2020-12-19 ENCOUNTER — Encounter: Payer: BC Managed Care – PPO | Admitting: Nutrition

## 2020-12-19 MED FILL — Fosaprepitant Dimeglumine For IV Infusion 150 MG (Base Eq): INTRAVENOUS | Qty: 5 | Status: AC

## 2020-12-19 NOTE — Telephone Encounter (Signed)
Received call from [place] asking for additional records.  Nees report of ERCP done 08/22/20, EUS done 08/24/20, cytology /path from 12/16/19 or 22 & any procedure related to this.  She asked if pt had colonoscopy.  Ph # to [facility] is [place] & fax # is [phone].  Message routed to HIM/Pod RN

## 2020-12-20 ENCOUNTER — Inpatient Hospital Stay: Payer: BC Managed Care – PPO | Admitting: Nutrition

## 2020-12-20 ENCOUNTER — Other Ambulatory Visit: Payer: Self-pay

## 2020-12-20 ENCOUNTER — Inpatient Hospital Stay: Payer: BC Managed Care – PPO | Attending: Hematology

## 2020-12-20 ENCOUNTER — Inpatient Hospital Stay (HOSPITAL_BASED_OUTPATIENT_CLINIC_OR_DEPARTMENT_OTHER): Payer: BC Managed Care – PPO | Admitting: Medical

## 2020-12-20 ENCOUNTER — Inpatient Hospital Stay: Payer: BC Managed Care – PPO

## 2020-12-20 ENCOUNTER — Telehealth: Payer: Self-pay

## 2020-12-20 ENCOUNTER — Encounter: Payer: Self-pay | Admitting: *Deleted

## 2020-12-20 VITALS — BP 133/80 | HR 88 | Temp 97.9°F | Resp 14 | Ht 62.0 in | Wt 133.8 lb

## 2020-12-20 VITALS — BP 130/62 | HR 76 | Temp 98.2°F | Resp 18

## 2020-12-20 DIAGNOSIS — D696 Thrombocytopenia, unspecified: Secondary | ICD-10-CM | POA: Diagnosis not present

## 2020-12-20 DIAGNOSIS — T451X5A Adverse effect of antineoplastic and immunosuppressive drugs, initial encounter: Secondary | ICD-10-CM | POA: Insufficient documentation

## 2020-12-20 DIAGNOSIS — D6489 Other specified anemias: Secondary | ICD-10-CM

## 2020-12-20 DIAGNOSIS — Z79899 Other long term (current) drug therapy: Secondary | ICD-10-CM | POA: Insufficient documentation

## 2020-12-20 DIAGNOSIS — E119 Type 2 diabetes mellitus without complications: Secondary | ICD-10-CM | POA: Diagnosis not present

## 2020-12-20 DIAGNOSIS — K831 Obstruction of bile duct: Secondary | ICD-10-CM | POA: Insufficient documentation

## 2020-12-20 DIAGNOSIS — Z5111 Encounter for antineoplastic chemotherapy: Secondary | ICD-10-CM | POA: Insufficient documentation

## 2020-12-20 DIAGNOSIS — C250 Malignant neoplasm of head of pancreas: Secondary | ICD-10-CM | POA: Diagnosis present

## 2020-12-20 DIAGNOSIS — C259 Malignant neoplasm of pancreas, unspecified: Secondary | ICD-10-CM | POA: Diagnosis not present

## 2020-12-20 DIAGNOSIS — D61810 Antineoplastic chemotherapy induced pancytopenia: Secondary | ICD-10-CM | POA: Insufficient documentation

## 2020-12-20 DIAGNOSIS — I10 Essential (primary) hypertension: Secondary | ICD-10-CM | POA: Diagnosis not present

## 2020-12-20 DIAGNOSIS — R609 Edema, unspecified: Secondary | ICD-10-CM | POA: Diagnosis not present

## 2020-12-20 DIAGNOSIS — Z5189 Encounter for other specified aftercare: Secondary | ICD-10-CM | POA: Insufficient documentation

## 2020-12-20 DIAGNOSIS — R7401 Elevation of levels of liver transaminase levels: Secondary | ICD-10-CM | POA: Insufficient documentation

## 2020-12-20 DIAGNOSIS — F418 Other specified anxiety disorders: Secondary | ICD-10-CM | POA: Insufficient documentation

## 2020-12-20 DIAGNOSIS — E876 Hypokalemia: Secondary | ICD-10-CM | POA: Insufficient documentation

## 2020-12-20 DIAGNOSIS — Z95828 Presence of other vascular implants and grafts: Secondary | ICD-10-CM

## 2020-12-20 DIAGNOSIS — D649 Anemia, unspecified: Secondary | ICD-10-CM | POA: Diagnosis not present

## 2020-12-20 LAB — CBC WITH DIFFERENTIAL (CANCER CENTER ONLY)
Abs Immature Granulocytes: 0.41 10*3/uL — ABNORMAL HIGH (ref 0.00–0.07)
Abs Immature Granulocytes: 0.39 10*3/uL — ABNORMAL HIGH (ref 0.00–0.07)
Basophils Absolute: 0 10*3/uL (ref 0.0–0.1)
Basophils Absolute: 0 10*3/uL (ref 0.0–0.1)
Basophils Relative: 1 %
Basophils Relative: 0 %
Eosinophils Absolute: 0 10*3/uL (ref 0.0–0.5)
Eosinophils Absolute: 0 10*3/uL (ref 0.0–0.5)
Eosinophils Relative: 0 %
Eosinophils Relative: 0 %
HCT: 24.7 % — ABNORMAL LOW (ref 36.0–46.0)
HCT: 25.4 % — ABNORMAL LOW (ref 36.0–46.0)
Hemoglobin: 7.8 g/dL — ABNORMAL LOW (ref 12.0–15.0)
Hemoglobin: 8 g/dL — ABNORMAL LOW (ref 12.0–15.0)
Immature Granulocytes: 7 %
Immature Granulocytes: 6 %
Lymphocytes Relative: 26 %
Lymphocytes Relative: 25 %
Lymphs Abs: 1.4 10*3/uL (ref 0.7–4.0)
Lymphs Abs: 1.7 10*3/uL (ref 0.7–4.0)
MCH: 29.4 pg (ref 26.0–34.0)
MCH: 29.1 pg (ref 26.0–34.0)
MCHC: 31.6 g/dL (ref 30.0–36.0)
MCHC: 31.5 g/dL (ref 30.0–36.0)
MCV: 93.2 fL (ref 80.0–100.0)
MCV: 92.4 fL (ref 80.0–100.0)
Monocytes Absolute: 0.7 10*3/uL (ref 0.1–1.0)
Monocytes Absolute: 1 10*3/uL (ref 0.1–1.0)
Monocytes Relative: 13 %
Monocytes Relative: 14 %
Neutro Abs: 2.9 10*3/uL (ref 1.7–7.7)
Neutro Abs: 3.8 10*3/uL (ref 1.7–7.7)
Neutrophils Relative %: 53 %
Neutrophils Relative %: 55 %
Platelet Count: 7 10*3/uL — CL (ref 150–400)
Platelet Count: 8 10*3/uL — CL (ref 150–400)
RBC: 2.65 MIL/uL — ABNORMAL LOW (ref 3.87–5.11)
RBC: 2.75 MIL/uL — ABNORMAL LOW (ref 3.87–5.11)
RDW: 20.4 % — ABNORMAL HIGH (ref 11.5–15.5)
RDW: 20.5 % — ABNORMAL HIGH (ref 11.5–15.5)
WBC Count: 5.6 10*3/uL (ref 4.0–10.5)
WBC Count: 6.9 10*3/uL (ref 4.0–10.5)
nRBC: 0 % (ref 0.0–0.2)
nRBC: 0.3 % — ABNORMAL HIGH (ref 0.0–0.2)

## 2020-12-20 LAB — RETIC PANEL
Immature Retic Fract: 38.8 % — ABNORMAL HIGH (ref 2.3–15.9)
RBC.: 2.68 MIL/uL — ABNORMAL LOW (ref 3.87–5.11)
Retic Count, Absolute: 28.9 10*3/uL (ref 19.0–186.0)
Retic Ct Pct: 1.1 % (ref 0.4–3.1)
Reticulocyte Hemoglobin: 42.6 pg (ref >27.9)

## 2020-12-20 LAB — CMP (CANCER CENTER ONLY)
ALT: 41 U/L (ref 0–44)
AST: 44 U/L — ABNORMAL HIGH (ref 15–41)
Albumin: 3 g/dL — ABNORMAL LOW (ref 3.5–5.0)
Alkaline Phosphatase: 384 U/L — ABNORMAL HIGH (ref 38–126)
Anion gap: 8 (ref 5–15)
BUN: 6 mg/dL — ABNORMAL LOW (ref 8–23)
CO2: 22 mmol/L (ref 22–32)
Calcium: 8 mg/dL — ABNORMAL LOW (ref 8.9–10.3)
Chloride: 109 mmol/L (ref 98–111)
Creatinine: 0.74 mg/dL (ref 0.44–1.00)
GFR, Estimated: >60 mL/min (ref >60)
Glucose, Bld: 117 mg/dL — ABNORMAL HIGH (ref 70–99)
Potassium: 3.4 mmol/L — ABNORMAL LOW (ref 3.5–5.1)
Sodium: 139 mmol/L (ref 135–145)
Total Bilirubin: 0.6 mg/dL (ref 0.3–1.2)
Total Protein: 5.6 g/dL — ABNORMAL LOW (ref 6.5–8.1)

## 2020-12-20 LAB — LACTATE DEHYDROGENASE: LDH: 182 U/L (ref 98–192)

## 2020-12-20 LAB — GENETIC SCREENING ORDER

## 2020-12-20 LAB — DIC (DISSEMINATED INTRAVASCULAR COAGULATION)PANEL
D-Dimer, Quant: 1.19 ug/mL-FEU — ABNORMAL HIGH (ref 0.00–0.50)
Fibrinogen: 377 mg/dL (ref 210–475)
INR: 1.2 (ref 0.8–1.2)
Platelets: 8 10*3/uL — CL (ref 150–400)
Prothrombin Time: 14.7 seconds (ref 11.4–15.2)
Smear Review: NONE SEEN
aPTT: 27 seconds (ref 24–36)

## 2020-12-20 LAB — SAMPLE TO BLOOD BANK

## 2020-12-20 LAB — PREPARE RBC (CROSSMATCH)

## 2020-12-20 LAB — ABO/RH: ABO/RH(D): A POS

## 2020-12-20 MED ORDER — SODIUM CHLORIDE 0.9% FLUSH
10.0000 mL | INTRAVENOUS | Status: DC | PRN
  Administered 2020-12-20: 10 mL
  Filled 2020-12-20: qty 10

## 2020-12-20 MED ORDER — SODIUM CHLORIDE 0.9% FLUSH
10.0000 mL | INTRAVENOUS | Status: DC | PRN
Start: 2020-12-20 — End: 2020-12-20
  Administered 2020-12-20: 10 mL
  Filled 2020-12-20: qty 10

## 2020-12-20 MED ORDER — DIPHENHYDRAMINE HCL 25 MG PO CAPS
25.0000 mg | ORAL_CAPSULE | Freq: Once | ORAL | Status: AC
  Administered 2020-12-20: 25 mg via ORAL

## 2020-12-20 MED ORDER — ACETAMINOPHEN 325 MG PO TABS
ORAL_TABLET | ORAL | Status: AC
  Filled 2020-12-20: qty 2

## 2020-12-20 MED ORDER — ACETAMINOPHEN 325 MG PO TABS
650.0000 mg | ORAL_TABLET | Freq: Once | ORAL | Status: AC
  Administered 2020-12-20: 650 mg via ORAL

## 2020-12-20 MED ORDER — SODIUM CHLORIDE 0.9% IV SOLUTION
250.0000 mL | Freq: Once | INTRAVENOUS | Status: AC
  Administered 2020-12-20: 250 mL via INTRAVENOUS
  Filled 2020-12-20: qty 250

## 2020-12-20 MED ORDER — HEPARIN SOD (PORK) LOCK FLUSH 100 UNIT/ML IV SOLN
500.0000 [IU] | Freq: Once | INTRAVENOUS | Status: AC | PRN
  Administered 2020-12-20: 500 [IU]
  Filled 2020-12-20: qty 5

## 2020-12-20 MED ORDER — DIPHENHYDRAMINE HCL 25 MG PO CAPS
ORAL_CAPSULE | ORAL | Status: AC
  Filled 2020-12-20: qty 1

## 2020-12-20 NOTE — Telephone Encounter (Signed)
Critical Value: Platelets: 7,000 Sandi Mealy, PA-C notified.

## 2020-12-20 NOTE — Progress Notes (Unsigned)
Critical Platelet count of 8 was given to Su Hilt at 1301 by Dorian Furnace (MT)

## 2020-12-20 NOTE — Patient Instructions (Signed)

## 2020-12-20 NOTE — Patient Instructions (Signed)
Blood Transfusion, Adult, Care After This sheet gives you information about how to care for yourself after your procedure. Your doctor may also give you more specific instructions. If you have problems or questions, contact your doctor. What can I expect after the procedure? After the procedure, it is common to have:  Bruising and soreness at the IV site.  A fever or chills on the day of the procedure. This may be your body's response to the new blood cells received.  A headache. Follow these instructions at home: Insertion site care  Follow instructions from your doctor about how to take care of your insertion site. This is where an IV tube was put into your vein. Make sure you: ? Wash your hands with soap and water before and after you change your bandage (dressing). If you cannot use soap and water, use hand sanitizer. ? Change your bandage as told by your doctor.  Check your insertion site every day for signs of infection. Check for: ? Redness, swelling, or pain. ? Bleeding from the site. ? Warmth. ? Pus or a bad smell.      General instructions  Take over-the-counter and prescription medicines only as told by your doctor.  Rest as told by your doctor.  Go back to your normal activities as told by your doctor.  Keep all follow-up visits as told by your doctor. This is important. Contact a doctor if:  You have itching or red, swollen areas of skin (hives).  You feel worried or nervous (anxious).  You feel weak after doing your normal activities.  You have redness, swelling, warmth, or pain around the insertion site.  You have blood coming from the insertion site, and the blood does not stop with pressure.  You have pus or a bad smell coming from the insertion site. Get help right away if:  You have signs of a serious reaction. This may be coming from an allergy or the body's defense system (immune system). Signs include: ? Trouble breathing or shortness of  breath. ? Swelling of the face or feeling warm (flushed). ? Fever or chills. ? Head, chest, or back pain. ? Dark pee (urine) or blood in the pee. ? Widespread rash. ? Fast heartbeat. ? Feeling dizzy or light-headed. You may receive your blood transfusion in an outpatient setting. If so, you will be told whom to contact to report any reactions. These symptoms may be an emergency. Do not wait to see if the symptoms will go away. Get medical help right away. Call your local emergency services (911 in the [place] Do not drive yourself to the hospital. Summary  Bruising and soreness at the IV site are common.  Check your insertion site every day for signs of infection.  Rest as told by your doctor. Go back to your normal activities as told by your doctor.  Get help right away if you have signs of a serious reaction. This information is not intended to replace advice given to you by your health care provider. Make sure you discuss any questions you have with your health care provider. Document Revised: 04/30/2019 Document Reviewed: 04/30/2019 Elsevier Patient Education  2021 Elsevier Inc.  Platelet Transfusion A platelet transfusion is a procedure in which you receive donated platelets through an IV. Platelets are tiny pieces of blood cells. When you get an injury, platelets clump together in the area to form a blood clot. This helps stop bleeding and is the beginning of the healing process. If you   have too few platelets, your blood may have trouble clotting. This may cause you to bleed and bruise very easily. You may need a platelet transfusion if you have a condition that causes a low number of platelets (thrombocytopenia). A platelet transfusion may be used to stop or prevent excessive bleeding. Tell a health care provider about:  Any reactions you have had during previous transfusions.  Any allergies you have.  All medicines you are taking, including vitamins, herbs, eye drops, creams,  and over-the-counter medicines.  Any blood disorders you have.  Any surgeries you have had.  Any medical conditions you have.  Whether you are pregnant or may be pregnant. What are the risks? Generally, this is a safe procedure. However, problems may occur, including:  Fever.  Infection.  Allergic reaction to the donor platelets.  Your body's disease-fighting system (immune system) attacking the donor platelets (hemolytic reaction). This is rare.  A rare reaction that causes lung damage (transfusion-related acute lung injury). What happens before the procedure? Medicines  Ask your health care provider about: ? Changing or stopping your regular medicines. This is especially important if you are taking diabetes medicines or blood thinners. ? Taking medicines such as aspirin and ibuprofen. These medicines can thin your blood. Do not take these medicines unless your health care provider tells you to take them. ? Taking over-the-counter medicines, vitamins, herbs, and supplements. General instructions  You will have a blood test to determine your blood type. Your blood type determines what kind of platelets you will be given.  Follow instructions from your health care provider about eating or drinking restrictions.  If you have had an allergic reaction to a transfusion in the past, you may be given medicine to help prevent a reaction.  Your temperature, blood pressure, pulse, and breathing will be monitored. What happens during the procedure?  An IV will be inserted into one of your veins.  For your safety, two health care providers will verify your identity along with the donor platelets about to be infused.  A bag of donor platelets will be connected to your IV. The platelets will flow into your bloodstream. This usually takes 30-60 minutes.  Your temperature, blood pressure, pulse, and breathing will be monitored during the transfusion. This helps detect early signs of any  reaction.  You will also be monitored for other symptoms that may indicate a reaction, including chills, hives, or itching.  If you have signs of a reaction at any time, your transfusion will be stopped, and you may be given medicine to help manage the reaction.  When your transfusion is complete, your IV will be removed.  Pressure may be applied to the IV site for a few minutes to stop any bleeding.  The IV site will be covered with a bandage (dressing). The procedure may vary among health care providers and hospitals.   What happens after the procedure?  Your blood pressure, temperature, pulse, and breathing will be monitored until you leave the hospital or clinic.  You may have some bruising and soreness at your IV site. Follow these instructions at home: Medicines  Take over-the-counter and prescription medicines only as told by your health care provider.  Talk with your health care provider before you take any medicines that contain aspirin or NSAIDs. These medicines increase your risk for dangerous bleeding. General instructions  Change or remove your dressing as told by your health care provider.  Return to your normal activities as told by your health care   provider. Ask your health care provider what activities are safe for you.  Do not take baths, swim, or use a hot tub until your health care provider approves. Ask your health care provider if you may take showers.  Check your IV site every day for signs of infection. Check for: ? Redness, swelling, or pain. ? Fluid or blood. If fluid or blood drains from your IV site, use your hands to press down firmly on a bandage covering the area for a minute or two. Doing this should stop the bleeding. ? Warmth. ? Pus or a bad smell.  Keep all follow-up visits as told by your health care provider. This is important. Contact a health care provider if you have:  A headache that does not go away with medicine.  Hives, rash, or  itchy skin.  Nausea or vomiting.  Unusual tiredness or weakness.  Signs of infection at your IV site. Get help right away if:  You have a fever or chills.  You urinate less often than usual.  Your urine is darker colored than normal.  You have any of the following: ? Trouble breathing. ? Pain in your back, abdomen, or chest. ? Cool, clammy skin. ? A fast heartbeat. Summary  Platelets are tiny pieces of blood cells that clump together to form a blood clot when you have an injury. If you have too few platelets, your blood may have trouble clotting.  A platelet transfusion is a procedure in which you receive donated platelets through an IV.  A platelet transfusion may be used to stop or prevent excessive bleeding.  After the procedure, check your IV site every day for signs of infection, including redness, swelling, pain, or warmth. This information is not intended to replace advice given to you by your health care provider. Make sure you discuss any questions you have with your health care provider. Document Revised: 12/11/2017 Document Reviewed: 12/11/2017 Elsevier Patient Education  2021 Elsevier Inc.  

## 2020-12-20 NOTE — Progress Notes (Signed)
Nutrition follow-up completed with patient receiving treatment for pancreatic cancer.  Patient has been scheduled for neoadjuvant FOLFIRINOX every 2 weeks. Weight continues to decrease and was documented as 133.8 pounds. Patient reports she has been restricting her diet secondary to fear of diabetes. She continues to report taste alterations. Reports she is eating canned chicken soup and frozen meals.  Her husband does the grocery shopping. Reports she needed to lose weight. Reports that this will be her last chemotherapy treatment.  Nutrition diagnosis: Inadequate oral intake continues.  Intervention: Educated patient on the importance of adequate calories and protein to promote healing and minimize weight loss. Educated on strategies to improve taste alterations tailored to patient's specific complaint. Discouraged patient from restricting oral diet.  Did promote healthy plant-based foods but did not encourage her to restrict carbohydrates overall. Fact sheets were provided.  Questions were answered.  Teach back method used.  Contact information given.  Monitoring, evaluation, goals: Patient will tolerate increased calories and protein to minimize further weight loss.  Next visit: Patient will contact RD with questions or concerns.  **Disclaimer: This note was dictated with voice recognition software. Similar sounding words can inadvertently be transcribed and this note may contain transcription errors which may not have been corrected upon publication of note.**

## 2020-12-21 ENCOUNTER — Other Ambulatory Visit: Payer: Self-pay

## 2020-12-21 DIAGNOSIS — D6489 Other specified anemias: Secondary | ICD-10-CM

## 2020-12-21 DIAGNOSIS — C259 Malignant neoplasm of pancreas, unspecified: Secondary | ICD-10-CM

## 2020-12-21 LAB — BPAM RBC
Blood Product Expiration Date: 202202202359
ISSUE DATE / TIME: 202202011253
Unit Type and Rh: 6200

## 2020-12-21 LAB — BPAM PLATELET PHERESIS
Blood Product Expiration Date: 202202022359
ISSUE DATE / TIME: 202202011256
Unit Type and Rh: 5100

## 2020-12-21 LAB — PREPARE PLATELET PHERESIS: Unit division: 0

## 2020-12-21 LAB — TYPE AND SCREEN
ABO/RH(D): A POS
Antibody Screen: NEGATIVE
Unit division: 0

## 2020-12-21 LAB — HAPTOGLOBIN: Haptoglobin: 132 mg/dL (ref 37–355)

## 2020-12-21 LAB — CANCER ANTIGEN 19-9: CA 19-9: 15 U/mL (ref 0–35)

## 2020-12-22 ENCOUNTER — Inpatient Hospital Stay: Payer: BC Managed Care – PPO

## 2020-12-22 ENCOUNTER — Ambulatory Visit: Payer: BC Managed Care – PPO | Admitting: Hematology

## 2020-12-22 ENCOUNTER — Other Ambulatory Visit: Payer: BC Managed Care – PPO

## 2020-12-22 ENCOUNTER — Other Ambulatory Visit: Payer: Self-pay

## 2020-12-22 DIAGNOSIS — Z5111 Encounter for antineoplastic chemotherapy: Secondary | ICD-10-CM | POA: Diagnosis not present

## 2020-12-22 DIAGNOSIS — D696 Thrombocytopenia, unspecified: Secondary | ICD-10-CM

## 2020-12-22 DIAGNOSIS — D6489 Other specified anemias: Secondary | ICD-10-CM

## 2020-12-22 DIAGNOSIS — Z95828 Presence of other vascular implants and grafts: Secondary | ICD-10-CM

## 2020-12-22 LAB — CMP (CANCER CENTER ONLY)
ALT: 51 U/L — ABNORMAL HIGH (ref 0–44)
AST: 66 U/L — ABNORMAL HIGH (ref 15–41)
Albumin: 3.1 g/dL — ABNORMAL LOW (ref 3.5–5.0)
Alkaline Phosphatase: 521 U/L — ABNORMAL HIGH (ref 38–126)
Anion gap: 7 (ref 5–15)
BUN: 7 mg/dL — ABNORMAL LOW (ref 8–23)
CO2: 26 mmol/L (ref 22–32)
Calcium: 8.3 mg/dL — ABNORMAL LOW (ref 8.9–10.3)
Chloride: 110 mmol/L (ref 98–111)
Creatinine: 0.76 mg/dL (ref 0.44–1.00)
GFR, Estimated: >60 mL/min (ref >60)
Glucose, Bld: 114 mg/dL — ABNORMAL HIGH (ref 70–99)
Potassium: 3.1 mmol/L — ABNORMAL LOW (ref 3.5–5.1)
Sodium: 143 mmol/L (ref 135–145)
Total Bilirubin: 0.5 mg/dL (ref 0.3–1.2)
Total Protein: 5.6 g/dL — ABNORMAL LOW (ref 6.5–8.1)

## 2020-12-22 LAB — CBC WITH DIFFERENTIAL (CANCER CENTER ONLY)
Abs Immature Granulocytes: 1.28 10*3/uL — ABNORMAL HIGH (ref 0.00–0.07)
Basophils Absolute: 0 10*3/uL (ref 0.0–0.1)
Basophils Relative: 0 %
Eosinophils Absolute: 0 10*3/uL (ref 0.0–0.5)
Eosinophils Relative: 0 %
HCT: 30 % — ABNORMAL LOW (ref 36.0–46.0)
Hemoglobin: 9.9 g/dL — ABNORMAL LOW (ref 12.0–15.0)
Immature Granulocytes: 9 %
Lymphocytes Relative: 14 %
Lymphs Abs: 2 10*3/uL (ref 0.7–4.0)
MCH: 29.6 pg (ref 26.0–34.0)
MCHC: 33 g/dL (ref 30.0–36.0)
MCV: 89.8 fL (ref 80.0–100.0)
Monocytes Absolute: 1.4 10*3/uL — ABNORMAL HIGH (ref 0.1–1.0)
Monocytes Relative: 9 %
Neutro Abs: 10.1 10*3/uL — ABNORMAL HIGH (ref 1.7–7.7)
Neutrophils Relative %: 68 %
Platelet Count: 31 10*3/uL — ABNORMAL LOW (ref 150–400)
RBC: 3.34 MIL/uL — ABNORMAL LOW (ref 3.87–5.11)
RDW: 20.9 % — ABNORMAL HIGH (ref 11.5–15.5)
WBC Count: 14.9 10*3/uL — ABNORMAL HIGH (ref 4.0–10.5)
nRBC: 0.5 % — ABNORMAL HIGH (ref 0.0–0.2)

## 2020-12-22 LAB — OCCULT BLOOD X 1 CARD TO LAB, STOOL
Fecal Occult Bld: NEGATIVE
Fecal Occult Bld: NEGATIVE
Fecal Occult Bld: NEGATIVE

## 2020-12-22 LAB — SAMPLE TO BLOOD BANK

## 2020-12-22 MED ORDER — SODIUM CHLORIDE 0.9% FLUSH
10.0000 mL | INTRAVENOUS | Status: DC | PRN
  Administered 2020-12-22: 10 mL
  Filled 2020-12-22: qty 10

## 2020-12-22 MED ORDER — HEPARIN SOD (PORK) LOCK FLUSH 100 UNIT/ML IV SOLN
500.0000 [IU] | Freq: Once | INTRAVENOUS | Status: AC | PRN
  Administered 2020-12-22: 500 [IU]
  Filled 2020-12-22: qty 5

## 2020-12-22 NOTE — Progress Notes (Signed)
[facility]pt[facility] Progress Note   SABRIN DUNLEVY [id number] May 05, 1961 60 y.o.  Jacqueline Tyler is managed by Dr. Truitt Merle  Actively treated with chemotherapy/immunotherapy/hormonal therapy: yes  Current therapy: Neoadjuvant FOLFIRINOX   Last treated: 12/08/2020 (cycle #7, day #1)  Next scheduled appointment with provider: 12/29/2020  Assessment: Plan:    Anemia due to other cause, not classified - Plan: CBC with Differential ([place] Only), Draw extra clot specimen, Sample to Blood Bank, Informed Consent Details: Physician/Practitioner Attestation; Transcribe to consent form and obtain patient signature, Type and screen, Care order/instruction, Prepare RBC (crossmatch), Prepare platelet pheresis, Prepare RBC (crossmatch), Prepare platelet pheresis, Type and screen, Care order/instruction, Retic Panel, Lactate dehydrogenase (LDH), Haptoglobin, DIC (disseminated intravasc coag) panel, CBC with Differential ([place] Only), CMP ([place] only), Sample to Blood Bank, Occult blood card to lab, stool, Occult blood card to lab, stool, Occult blood card to lab, stool  Thrombocytopenia ([place]) - Plan: CBC with Differential ([place] Only), Draw extra clot specimen, Sample to Blood Bank, Informed Consent Details: Physician/Practitioner Attestation; Transcribe to consent form and obtain patient signature, Type and screen, Care order/instruction, Prepare RBC (crossmatch), Prepare platelet pheresis, Prepare RBC (crossmatch), Prepare platelet pheresis, Type and screen, Care order/instruction, Retic Panel, Lactate dehydrogenase (LDH), Haptoglobin, DIC (disseminated intravasc coag) panel, CBC with Differential ([place] Only), CMP ([place] only), Sample to Blood Bank  Malignant neoplasm of pancreas, unspecified location of malignancy ([place])   Anemia of unknown etiology: A CBC returned today with a hemoglobin of 7.8.  The patient will be transfused with 1 unit of packed red  blood cells today.  She will return later this week for labs.  She has been given Hemoccult cards to complete and return.  A reticulocyte count, LDH, haptoglobin, and DIC profile were collected today.  Thrombocytopenia of unknown etiology: A CBC returned today with a platelet count of 7.  The patient was given 1 unit of platelets today.  She will return later this week for repeat labs and an office visit. A reticulocyte count, LDH, haptoglobin, and DIC profile were collected today.  Malignant neoplasm of the pancreas: The patient was to receive cycle eight of FOLFIRINOX today.  However based on her new finding of thrombocytopenia and anemia her treatment will be held today.  She has an appointment scheduled next week to meet with a surgeon at [facility].  Please see After Visit Summary for patient specific instructions.  Future Appointments  Date Time Provider [place]  12/29/2020  8:00 AM [facility] A1 [facility] None    Orders Placed This Encounter  Procedures  . CBC with Differential ([place] Only)  . Draw extra clot specimen  . Retic Panel  . Lactate dehydrogenase (LDH)  . Haptoglobin  . DIC (disseminated intravasc coag) panel  . CBC with Differential ([place] Only)  . CMP ([place] only)  . Occult blood card to lab, stool  . Occult blood card to lab, stool  . Occult blood card to lab, stool  . Informed Consent Details: Physician/Practitioner Attestation; Transcribe to consent form and obtain patient signature  . Care order/instruction  . Sample to Blood Bank  . Type and screen  . Prepare RBC (crossmatch)  . Prepare platelet pheresis  . Sample to Blood Bank       Subjective:   Patient ID:  MELISSSA DONNER is a 60 y.o. (DOB 03/07/1961) female.  Chief Complaint: No chief complaint on file.   HPI  GAE BIHL  is a 60 y.o. female with a diagnosis of an adenocarcinoma of the pancreas.  She is managed by Dr. Truitt Merle and presents to the  office today for consideration of cycle #8 of FOLFIRINOX.  She is scheduled to be seen next week by a surgeon at [facility] to discuss an upcoming planned surgery.  She reports that she has been having mild nosebleeds but otherwise denies melena, bright red blood per rectum, vaginal bleeding, or hematuria.  She denies any other issues of concern today.  She has had no changes in medications.   Medications: I have reviewed the patient's current medications.  Allergies:  Allergies  Allergen Reactions  . Oxycodone-Acetaminophen Nausea Only    Patient was unsure / doesn't recall taking   . Poison Ivy Extract Hives    Patient states HIGHLY allergice  . Tyloxapol Other (See Comments)    Nausea  Nausea     Past Medical History:  Diagnosis Date  . Anxiety   . Cancer ([place]) 07/2020   pancreatic cancer  . Depression   . Diabetes mellitus without complication ([place]) [id number]   pancreatic cancer  . High cholesterol   . Hypertension     Past Surgical History:  Procedure Laterality Date  . BILIARY BRUSHING  08/22/2020   Procedure: BILIARY BRUSHING;  Surgeon: Clarene Essex, MD;  Location: [facility];  Service: Endoscopy;;  . BILIARY STENT PLACEMENT N/A 08/22/2020   Procedure: BILIARY STENT PLACEMENT;  Surgeon: Clarene Essex, MD;  Location: [facility];  Service: Endoscopy;  Laterality: N/A;  . ENDOSCOPIC RETROGRADE CHOLANGIOPANCREATOGRAPHY (ERCP) WITH PROPOFOL N/A 08/22/2020   Procedure: ENDOSCOPIC RETROGRADE CHOLANGIOPANCREATOGRAPHY (ERCP) WITH PROPOFOL;  Surgeon: Clarene Essex, MD;  Location: [facility];  Service: Endoscopy;  Laterality: N/A;  . ESOPHAGOGASTRODUODENOSCOPY (EGD) WITH PROPOFOL N/A 08/24/2020   Procedure: ESOPHAGOGASTRODUODENOSCOPY (EGD) WITH PROPOFOL;  Surgeon: Arta Silence, MD;  Location: [facility];  Service: Endoscopy;  Laterality: N/A;  . FINE NEEDLE ASPIRATION N/A 08/24/2020   Procedure: FINE NEEDLE ASPIRATION (FNA) LINEAR;  Surgeon: Arta Silence, MD;   Location: [facility];  Service: Endoscopy;  Laterality: N/A;  . PORTACATH PLACEMENT Right 09/08/2020   Procedure: INSERTION PORT-A-CATH;  Surgeon: Dwan Bolt, MD;  Location: [place];  Service: General;  Laterality: Right;  . SPHINCTEROTOMY  08/22/2020   Procedure: SPHINCTEROTOMY;  Surgeon: Clarene Essex, MD;  Location: [facility];  Service: Endoscopy;;  . UPPER ESOPHAGEAL ENDOSCOPIC ULTRASOUND (EUS) N/A 08/24/2020   Procedure: UPPER ESOPHAGEAL ENDOSCOPIC ULTRASOUND (EUS);  Surgeon: Arta Silence, MD;  Location: [facility];  Service: Endoscopy;  Laterality: N/A;    Family History  Problem Relation Age of Onset  . CAD Mother   . Cancer Cousin        paternal cousin; unknown type; dx >50  . Other Cousin        paternal cousin; brain tumor    Social History   Socioeconomic History  . Marital status: Married    Spouse name: Not on file  . Number of children: 1  . Years of education: Not on file  . Highest education level: Not on file  Occupational History  . Occupation: Medical illustrator   Tobacco Use  . Smoking status: Never Smoker  . Smokeless tobacco: Never Used  Vaping Use  . Vaping Use: Never used  Substance and Sexual Activity  . Alcohol use: Never  . Drug use: Never  . Sexual activity: Not on file  Other Topics Concern  .  Not on file  Social History Narrative  . Not on file   Social Determinants of Health   Financial Resource Strain: Not on file  Food Insecurity: Not on file  Transportation Needs: Not on file  Physical Activity: Not on file  Stress: Not on file  Social Connections: Not on file  Intimate Partner Violence: Not on file    Past Medical History, Surgical history, Social history, and Family history were reviewed and updated as appropriate.   Please see review of systems for further details on the patient's review from today.   Review of Systems:  Review of Systems  Constitutional: Negative for chills, diaphoresis and  fever.  HENT: Positive for nosebleeds. Negative for trouble swallowing and voice change.   Respiratory: Negative for cough, chest tightness, shortness of breath and wheezing.   Cardiovascular: Negative for chest pain and palpitations.  Gastrointestinal: Negative for abdominal pain, constipation, diarrhea, nausea and vomiting.  Musculoskeletal: Negative for back pain and myalgias.  Neurological: Negative for dizziness, light-headedness and headaches.  Hematological: Does not bruise/bleed easily.    Objective:   Physical Exam:  BP 133/80 (BP Location: Left Arm, Patient Position: Sitting)   Pulse 88   Temp 97.9 F (36.6 C) (Tympanic)   Resp 14   Ht 5\' 2"  (1.575 m)   Wt 133 lb 12.8 oz (60.7 kg)   SpO2 100%   BMI 24.47 kg/m  ECOG: 0  Physical Exam Constitutional:      General: She is not in acute distress.    Appearance: She is not diaphoretic.  HENT:     Head: Normocephalic and atraumatic.  Eyes:     General: No scleral icterus.       Right eye: No discharge.        Left eye: No discharge.     Conjunctiva/sclera: Conjunctivae normal.  Cardiovascular:     Rate and Rhythm: Normal rate and regular rhythm.     Heart sounds: Normal heart sounds. No murmur heard. No friction rub. No gallop.   Pulmonary:     Effort: Pulmonary effort is normal. No respiratory distress.     Breath sounds: Normal breath sounds. No wheezing or rales.  Skin:    General: Skin is warm and dry.     Findings: No erythema or rash.  Neurological:     Mental Status: She is alert.     Coordination: Coordination normal.     Gait: Gait normal.  Psychiatric:        Mood and Affect: Mood normal.        Behavior: Behavior normal.        Thought Content: Thought content normal.        Judgment: Judgment normal.     Lab Review:     Component Value Date/Time   NA 139 12/20/2020 0830   K 3.4 (L) 12/20/2020 0830   CL 109 12/20/2020 0830   CO2 22 12/20/2020 0830   GLUCOSE 117 (H) 12/20/2020 0830   BUN  6 (L) 12/20/2020 0830   CREATININE 0.74 12/20/2020 0830   CALCIUM 8.0 (L) 12/20/2020 0830   PROT 5.6 (L) 12/20/2020 0830   ALBUMIN 3.0 (L) 12/20/2020 0830   AST 44 (H) 12/20/2020 0830   ALT 41 12/20/2020 0830   ALKPHOS 384 (H) 12/20/2020 0830   BILITOT 0.6 12/20/2020 0830   GFRNONAA >60 12/20/2020 0830   GFRAA >60 08/23/2020 0606       Component Value Date/Time   WBC 6.9 12/20/2020 1004  WBC 10.7 (H) 08/24/2020 0516   RBC 2.68 (L) 12/20/2020 1134   RBC 2.75 (L) 12/20/2020 1004   HGB 8.0 (L) 12/20/2020 1004   HCT 25.4 (L) 12/20/2020 1004   PLT 8 (LL) 12/20/2020 1134   PLT 8 (LL) 12/20/2020 1004   MCV 92.4 12/20/2020 1004   MCH 29.1 12/20/2020 1004   MCHC 31.5 12/20/2020 1004   RDW 20.5 (H) 12/20/2020 1004   LYMPHSABS 1.7 12/20/2020 1004   MONOABS 1.0 12/20/2020 1004   EOSABS 0.0 12/20/2020 1004   BASOSABS 0.0 12/20/2020 1004   -------------------------------  Imaging from last 24 hours (if applicable):  Radiology interpretation: CT Abdomen Pelvis W Wo Contrast  Result Date: 12/02/2020 CLINICAL DATA:  Pancreatic cancer restaging, borderline resectable, status post chemotherapy EXAM: CT ABDOMEN AND PELVIS WITHOUT AND WITH CONTRAST TECHNIQUE: Multidetector CT imaging of the abdomen and pelvis was performed following the standard protocol before and following the bolus administration of intravenous contrast. CONTRAST:  187mL OMNIPAQUE IOHEXOL 300 MG/ML  SOLN COMPARISON:  08/20/2020 FINDINGS: Lower chest: No acute abnormality. Hepatobiliary: No solid liver abnormality is seen. Hepatic steatosis. No gallstones or gallbladder wall thickening. Interval placement of common bile duct stent, tip positioned in the distal duodenum, with relief of previously seen biliary ductal dilatation. Pancreas: Previously noted mass of the central pancreatic head is almost entirely resolved, difficult to appreciate on current examination (series 9, image 59). There remains obstruction of the pancreatic  duct near the head neck junction with mild prominence of the pancreatic duct, measuring up to 5 mm, with atrophy of the distal pancreatic parenchyma. The portal vein, splenic vein, and superior mesenteric vein are now widely patent, previously effaced at the confluence by mass effect (series 9, image 50). The celiac axis appears to directly traverse the vicinity of this mass in the pancreatic head although there does appear to be a fat plane about the vessel (series 9, image 55). Spleen: Normal in size without significant abnormality. Adrenals/Urinary Tract: Adrenal glands are unremarkable. Kidneys are normal, without renal calculi, solid lesion, or hydronephrosis. Bladder is unremarkable. Stomach/Bowel: Stomach is within normal limits. The appendix is not clearly visualized and may be surgically absent. The distal small bowel and colon are diffusely somewhat hyperenhancing and inflamed appearing with vascular combing and tethered appearance of the distal small bowel. No evidence of obstruction. Vascular/Lymphatic: Aortic atherosclerosis. There is anatomic variant common origin of the celiac axis and superior mesenteric artery. No enlarged abdominal or pelvic lymph nodes. Reproductive: Uterine fibroid. Other: No abdominal wall hernia or abnormality. No abdominopelvic ascites. Musculoskeletal: No acute or significant osseous findings. IMPRESSION: 1. Previously noted mass of the central pancreatic head is almost entirely resolved, difficult to discretely appreciate on current examination. Findings are consistent with treatment response. There remains obstruction of the pancreatic duct near the head neck junction with mild prominence of the pancreatic duct, measuring up to 5 mm, with atrophy of the distal pancreatic parenchyma. 2. The portal vein, splenic vein, and superior mesenteric vein are now widely patent, previously effaced at the confluence by mass effect. 3. Interval placement of common bile duct stent, tip  positioned in the distal duodenum, with relief of previously seen biliary ductal dilatation. 4. 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 Following the bifurcation of the celiac mesenteric trunk, the celiac axis appears to directly traverse the vicinity of the mass,  although there does appear to be a fat plane about the vessel. 5. The distal small bowel and colon are diffusely somewhat hyperenhancing and inflamed appearing with vascular combing and a tethered appearance of the distal small bowel. This appearance generally suggests inflammatory bowel disease such as Crohn's disease. Correlate with referable clinical history, if present. No evidence of obstruction or other acute complication. 6. Hepatic steatosis. 7. Aortic atherosclerosis. Aortic Atherosclerosis (ICD10-I70.0). Electronically Signed   By: Eddie Candle M.D.   On: 12/02/2020 09:52        This case was discussed with Dr. Burr Medico. She expressed agreement with my management of this patient.

## 2020-12-23 ENCOUNTER — Telehealth: Payer: Self-pay | Admitting: Hematology

## 2020-12-23 NOTE — Telephone Encounter (Signed)
Called to confirm next wks appts with pt . Pt is aware of appts on 2/9 for [facility] and appts on 2/10 for treatment in [place]

## 2020-12-26 ENCOUNTER — Ambulatory Visit: Payer: BC Managed Care – PPO | Admitting: Nurse Practitioner

## 2020-12-26 ENCOUNTER — Other Ambulatory Visit: Payer: BC Managed Care – PPO

## 2020-12-26 ENCOUNTER — Ambulatory Visit: Payer: BC Managed Care – PPO

## 2020-12-28 ENCOUNTER — Telehealth: Payer: Self-pay | Admitting: Hematology

## 2020-12-28 ENCOUNTER — Inpatient Hospital Stay: Payer: BC Managed Care – PPO

## 2020-12-28 ENCOUNTER — Other Ambulatory Visit: Payer: Self-pay

## 2020-12-28 ENCOUNTER — Inpatient Hospital Stay (HOSPITAL_BASED_OUTPATIENT_CLINIC_OR_DEPARTMENT_OTHER): Payer: BC Managed Care – PPO | Admitting: Hematology

## 2020-12-28 ENCOUNTER — Telehealth: Payer: Self-pay

## 2020-12-28 ENCOUNTER — Encounter: Payer: Self-pay | Admitting: Hematology

## 2020-12-28 VITALS — BP 115/61 | HR 63 | Temp 98.0°F | Resp 14 | Ht 62.0 in | Wt 135.2 lb

## 2020-12-28 DIAGNOSIS — Z5111 Encounter for antineoplastic chemotherapy: Secondary | ICD-10-CM | POA: Diagnosis not present

## 2020-12-28 DIAGNOSIS — R600 Localized edema: Secondary | ICD-10-CM

## 2020-12-28 DIAGNOSIS — C259 Malignant neoplasm of pancreas, unspecified: Secondary | ICD-10-CM

## 2020-12-28 DIAGNOSIS — C250 Malignant neoplasm of head of pancreas: Secondary | ICD-10-CM

## 2020-12-28 DIAGNOSIS — D6489 Other specified anemias: Secondary | ICD-10-CM

## 2020-12-28 DIAGNOSIS — Z95828 Presence of other vascular implants and grafts: Secondary | ICD-10-CM

## 2020-12-28 LAB — CMP (CANCER CENTER ONLY)
ALT: 108 U/L — ABNORMAL HIGH (ref 0–44)
AST: 166 U/L — ABNORMAL HIGH (ref 15–41)
Albumin: 2.9 g/dL — ABNORMAL LOW (ref 3.5–5.0)
Alkaline Phosphatase: 634 U/L — ABNORMAL HIGH (ref 38–126)
Anion gap: 5 (ref 5–15)
BUN: 8 mg/dL (ref 8–23)
CO2: 25 mmol/L (ref 22–32)
Calcium: 8.2 mg/dL — ABNORMAL LOW (ref 8.9–10.3)
Chloride: 110 mmol/L (ref 98–111)
Creatinine: 0.78 mg/dL (ref 0.44–1.00)
GFR, Estimated: >60 mL/min (ref >60)
Glucose, Bld: 125 mg/dL — ABNORMAL HIGH (ref 70–99)
Potassium: 3.8 mmol/L (ref 3.5–5.1)
Sodium: 140 mmol/L (ref 135–145)
Total Bilirubin: 0.6 mg/dL (ref 0.3–1.2)
Total Protein: 5.6 g/dL — ABNORMAL LOW (ref 6.5–8.1)

## 2020-12-28 LAB — CBC WITH DIFFERENTIAL (CANCER CENTER ONLY)
Abs Immature Granulocytes: 0.46 10*3/uL — ABNORMAL HIGH (ref 0.00–0.07)
Basophils Absolute: 0.1 10*3/uL (ref 0.0–0.1)
Basophils Relative: 0 %
Eosinophils Absolute: 0.1 10*3/uL (ref 0.0–0.5)
Eosinophils Relative: 1 %
HCT: 33.8 % — ABNORMAL LOW (ref 36.0–46.0)
Hemoglobin: 10.7 g/dL — ABNORMAL LOW (ref 12.0–15.0)
Immature Granulocytes: 4 %
Lymphocytes Relative: 16 %
Lymphs Abs: 2.1 10*3/uL (ref 0.7–4.0)
MCH: 29.6 pg (ref 26.0–34.0)
MCHC: 31.7 g/dL (ref 30.0–36.0)
MCV: 93.6 fL (ref 80.0–100.0)
Monocytes Absolute: 1 10*3/uL (ref 0.1–1.0)
Monocytes Relative: 8 %
Neutro Abs: 9 10*3/uL — ABNORMAL HIGH (ref 1.7–7.7)
Neutrophils Relative %: 71 %
Platelet Count: 141 10*3/uL — ABNORMAL LOW (ref 150–400)
RBC: 3.61 MIL/uL — ABNORMAL LOW (ref 3.87–5.11)
RDW: 22 % — ABNORMAL HIGH (ref 11.5–15.5)
WBC Count: 12.7 10*3/uL — ABNORMAL HIGH (ref 4.0–10.5)
nRBC: 0 % (ref 0.0–0.2)

## 2020-12-28 LAB — SAMPLE TO BLOOD BANK

## 2020-12-28 MED ORDER — SODIUM CHLORIDE 0.9% FLUSH
10.0000 mL | INTRAVENOUS | Status: DC | PRN
  Administered 2020-12-28: 10 mL
  Filled 2020-12-28: qty 10

## 2020-12-28 MED ORDER — HEPARIN SOD (PORK) LOCK FLUSH 100 UNIT/ML IV SOLN
500.0000 [IU] | Freq: Once | INTRAVENOUS | Status: AC | PRN
  Administered 2020-12-28: 500 [IU]
  Filled 2020-12-28: qty 5

## 2020-12-28 NOTE — Progress Notes (Signed)
Error

## 2020-12-28 NOTE — Progress Notes (Signed)
[place]   [phone] [phone]   Clinic Follow up Note   Patient Care Team: Nicola Girt, DO as PCP - General (Internal Medicine) Jonnie Finner, RN as Oncology Nurse Navigator Truitt Merle, MD as Consulting Physician (Oncology) Dwan Bolt, MD as Consulting Physician (General Surgery)  Date of Service:  12/28/2020  CHIEF COMPLAINT: F/u of pancreatic cancer  SUMMARY OF ONCOLOGIC HISTORY: Oncology History Overview Note  Cancer Staging Pancreatic cancer Procedure [facility]) Staging form: Exocrine Pancreas, AJCC 8th Edition - Clinical stage from 08/24/2020: Stage IIB (cT2, cN1, cM0) - Signed by Truitt Merle, MD on 08/30/2020    Pancreatic cancer ([place])  08/20/2020 Imaging   CT AP 08/20/20  IMPRESSION: 1. 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 Collateral circulation is noted. There is moderate intrahepatic and extrahepatic biliary dilatation which appears to be due to the pancreatic head mass. Pancreatic ductal dilatation is noted as well. 2. Probable 2.7 cm uterine fibroid. 3. Aortic atherosclerosis.   Aortic Atherosclerosis (ICD10-I70.0).   08/20/2020 Tumor Marker   Ca19-9 - 927   08/22/2020 Procedure   ERCP by Dr Watt Climes 08/22/20  IMPRESSION - The major papilla appeared normal. - A biliary sphincterotomy was performed. - Cells for cytology obtained in the lower third and middle of the main duct. - One plastic stent was placed into the common bile duct.   FINAL MICROSCOPIC DIAGNOSIS:  - No malignant cells identified  - Benign reactive/reparative changes   08/24/2020 Cancer Staging   Staging form: Exocrine Pancreas, AJCC 8th Edition - Clinical stage from 08/24/2020: Stage IIB (cT2, cN1, cM0) - Signed by Truitt Merle, MD on 08/30/2020   08/24/2020 Procedure   EUS  by Dr Paulita Fujita 08/24/20  IMPRESSION - There was no sign of significant pathology in the ampulla. - A few malignant-appearing lymph nodes were visualized in the peripancreatic region and porta hepatis region. - One stent was visualized endosonographically in the common bile duct. - A mass was identified in the pancreatic head. Tissue was obtained from this exam. The preliminary diagnosis is consistent with adenocarcinoma. Invasion into SMV/PV seen. Lymphadenopathy noted. This was staged T3 N1 Mx by endosonographic criteria. Fine needle aspiration performed.   08/24/2020 Initial Biopsy   FINAL MICROSCOPIC DIAGNOSIS: 08/24/20 - Malignant cells consistent with adenocarcinoma    08/30/2020 Initial Diagnosis   Pancreatic cancer ([place])   09/05/2020 Imaging   CT Chest  IMPRESSION: 1. Stable 2.7 cm infiltrating pancreatic head mass with borderline enlarged peripancreatic lymph nodes. 2. No findings for pulmonary metastatic disease. 3. Small hiatal hernia. 4. Aortic atherosclerosis.   Aortic Atherosclerosis (ICD10-I70.0).   09/08/2020 Procedure   INSERTION PORT-A-CATH by Dr Zenia Resides and Barry Dienes    09/12/2020 -  Chemotherapy   Neoadjuvant FOLFIRINOX q2weeks starting 09/12/20    12/02/2020 Imaging   CT CAP  IMPRESSION: 1. Previously noted mass of the central pancreatic head is almost entirely resolved, difficult to discretely appreciate on current examination. Findings are consistent with treatment response. There remains obstruction of the pancreatic duct near the head neck junction with mild prominence of the pancreatic duct, measuring up to 5 mm, with atrophy of the distal pancreatic parenchyma. 2. The portal vein, splenic vein, and superior mesenteric vein are now widely patent, previously effaced at the confluence by mass effect. 3. Interval  placement of common bile duct stent, tip positioned in the distal duodenum, with relief of previously seen biliary ductal dilatation. 4. 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 Following the bifurcation of the celiac mesenteric trunk, the celiac axis appears to directly traverse the vicinity of the mass, although there does appear to be a fat plane about the vessel. 5. The distal small bowel and colon are diffusely somewhat hyperenhancing and inflamed appearing with vascular combing and a tethered appearance of the distal small bowel. This appearance generally suggests inflammatory bowel disease such as Crohn's disease. Correlate with referable clinical history, if present. No evidence of obstruction or other acute complication. 6. Hepatic steatosis. 7. Aortic atherosclerosis.      CURRENT THERAPY:  Neoadjuvant FOLFIRINOX q2 weeks starting 09/12/20  INTERVAL HISTORY:  Jacqueline Tyler is here for a follow up. She presents to the clinic with her husband. She notes she had blood in her nasal drip last week, but no significant bleeding. She notes with last cycle chemo she felt fine with infusion but after pump d/c she started feeling chemo side effects. She notes she still has some today. She notes she has diarrhea and had incontinence yesterday. She has imodium, but has not taken it. In the past week she had 3 BMs. She notes her stool appearance has improved. She notes LE edema in her ankle, L>R. She denies pain from this. This does improves in the morning. She notes she was seen by Dr Crisoforo Oxford who offered her surgery. She will likely proceed with surgery in 6 weeks and have CT scan beforehand.    REVIEW OF SYSTEMS:   Constitutional: Denies fevers, chills or abnormal weight loss Eyes: Denies blurriness of vision Ears, nose, mouth, throat, and face: Denies mucositis or sore throat Respiratory: Denies cough, dyspnea or wheezes Cardiovascular: Denies palpitation, chest discomfort  (+) B/l lower extremity swelling, L>R Gastrointestinal:  Denies nausea, heartburn (+) Diarrhea  Skin: Denies abnormal skin rashes Lymphatics: Denies new lymphadenopathy or easy bruising Neurological:Denies numbness, tingling or new weaknesses Behavioral/Psych: Mood is stable, no new changes  All other systems were reviewed with the patient and are negative.  MEDICAL HISTORY:  Past Medical History:  Diagnosis Date  . Anxiety   . Cancer ([place]) 07/2020   pancreatic cancer  . Depression   . Diabetes mellitus without complication ([place]) 63/0160   pancreatic cancer  . High cholesterol   . Hypertension     SURGICAL HISTORY: Past Surgical History:  Procedure Laterality Date  . BILIARY BRUSHING  08/22/2020   Procedure: BILIARY BRUSHING;  Surgeon: Clarene Essex, MD;  Location: [facility];  Service: Endoscopy;;  . BILIARY STENT PLACEMENT N/A 08/22/2020   Procedure: BILIARY STENT PLACEMENT;  Surgeon: Clarene Essex, MD;  Location: [facility];  Service: Endoscopy;  Laterality: N/A;  . ENDOSCOPIC RETROGRADE CHOLANGIOPANCREATOGRAPHY (ERCP) WITH PROPOFOL N/A 08/22/2020   Procedure: ENDOSCOPIC RETROGRADE CHOLANGIOPANCREATOGRAPHY (ERCP) WITH PROPOFOL;  Surgeon: Clarene Essex, MD;  Location: [facility];  Service: Endoscopy;  Laterality: N/A;  . ESOPHAGOGASTRODUODENOSCOPY (EGD) WITH PROPOFOL N/A 08/24/2020   Procedure: ESOPHAGOGASTRODUODENOSCOPY (EGD) WITH PROPOFOL;  Surgeon: Arta Silence, MD;  Location: [facility];  Service: Endoscopy;  Laterality: N/A;  . FINE NEEDLE ASPIRATION N/A 08/24/2020   Procedure: FINE NEEDLE ASPIRATION (FNA) LINEAR;  Surgeon: Arta Silence, MD;  Location: [facility];  Service: Endoscopy;  Laterality: N/A;  .  PORTACATH PLACEMENT Right 09/08/2020   Procedure: INSERTION PORT-A-CATH;  Surgeon: Dwan Bolt, MD;  Location: [facility];  Service: General;  Laterality: Right;  . SPHINCTEROTOMY  08/22/2020   Procedure: SPHINCTEROTOMY;  Surgeon: Clarene Essex, MD;   Location: [facility];  Service: Endoscopy;;  . UPPER ESOPHAGEAL ENDOSCOPIC ULTRASOUND (EUS) N/A 08/24/2020   Procedure: UPPER ESOPHAGEAL ENDOSCOPIC ULTRASOUND (EUS);  Surgeon: Arta Silence, MD;  Location: [facility];  Service: Endoscopy;  Laterality: N/A;    I have reviewed the social history and family history with the patient and they are unchanged from previous note.  ALLERGIES:  is allergic to oxycodone-acetaminophen, poison ivy extract, and tyloxapol.  MEDICATIONS:  Current Outpatient Medications  Medication Sig Dispense Refill  . acetaminophen (TYLENOL) 325 MG tablet Take 650 mg by mouth every 6 (six) hours as needed.    . citalopram (CELEXA) 40 MG tablet Take 10 mg by mouth daily.     . cloNIDine (CATAPRES) 0.1 MG tablet Take 0.1 mg by mouth at bedtime.    . hydrochlorothiazide (HYDRODIURIL) 12.5 MG tablet Take 12.5 mg by mouth daily.    Marland Kitchen lidocaine-prilocaine (EMLA) cream Apply to affected area once 30 g 3  . magnesium oxide (MAG-OX) 400 (241.3 Mg) MG tablet Take 1 tablet (400 mg total) by mouth 2 (two) times daily. 60 tablet 0  . omeprazole (PRILOSEC) 20 MG capsule Take 20 mg by mouth daily.    . ondansetron (ZOFRAN) 8 MG tablet Take 1 tablet (8 mg total) by mouth 2 (two) times daily as needed. Start on day 3 after chemotherapy. 30 tablet 1  . pegfilgrastim-bmez (ZIEXTENZO) 6 MG/0.6ML injection Inject 6 mg into the skin every 14 (fourteen) days.    . potassium chloride (KLOR-CON) 10 MEQ tablet Take 1 tablet (10 mEq total) by mouth 3 (three) times daily. 90 tablet 0  . prochlorperazine (COMPAZINE) 10 MG tablet Take 1 tablet (10 mg total) by mouth every 6 (six) hours as needed (Nausea or vomiting). 30 tablet 1  . simvastatin (ZOCOR) 80 MG tablet Take 80 mg by mouth daily.     No current facility-administered medications for this visit.    PHYSICAL EXAMINATION: ECOG PERFORMANCE STATUS: 2 - Symptomatic, <50% confined to bed  Vitals:   12/28/20 1110  BP: 115/61  Pulse: 63   Resp: 14  Temp: 98 F (36.7 C)  SpO2: 99%   Filed Weights   12/28/20 1110  Weight: 135 lb 3.2 oz (61.3 kg)    Due to COVID19 we will limit examination to appearance. Patient had no complaints.  GENERAL:alert, no distress and comfortable SKIN: skin color normal, no rashes or significant lesions EYES: normal, Conjunctiva are pink and non-injected, sclera clear  NEURO: alert & oriented x 3 with fluent speech (+) LE Edema L>R   LABORATORY DATA:  I have reviewed the data as listed CBC Latest Ref Rng & Units 12/28/2020 12/22/2020 12/20/2020  WBC 4.0 - 10.5 K/uL 12.7(H) 14.9(H) -  Hemoglobin 12.0 - 15.0 g/dL 10.7(L) 9.9(L) -  Hematocrit 36.0 - 46.0 % 33.8(L) 30.0(L) -  Platelets 150 - 400 K/uL 141(L) 31(L) 8(LL)     CMP Latest Ref Rng & Units 12/28/2020 12/22/2020 12/20/2020  Glucose 70 - 99 mg/dL 125(H) 114(H) 117(H)  BUN 8 - 23 mg/dL 8 7(L) 6(L)  Creatinine 0.44 - 1.00 mg/dL 0.78 0.76 0.74  Sodium 135 - 145 mmol/L 140 143 139  Potassium 3.5 - 5.1 mmol/L 3.8 3.1(L) 3.4(L)  Chloride 98 - 111 mmol/L  110 110 109  CO2 22 - 32 mmol/L $RemoveB'25 26 22  'VuSxSgNt$ Calcium 8.9 - 10.3 mg/dL 8.2(L) 8.3(L) 8.0(L)  Total Protein 6.5 - 8.1 g/dL 5.6(L) 5.6(L) 5.6(L)  Total Bilirubin 0.3 - 1.2 mg/dL 0.6 0.5 0.6  Alkaline Phos 38 - 126 U/L 634(H) 521(H) 384(H)  AST 15 - 41 U/L 166(H) 66(H) 44(H)  ALT 0 - 44 U/L 108(H) 51(H) 41      RADIOGRAPHIC STUDIES: I have personally reviewed the radiological images as listed and agreed with the findings in the report. No results found.   ASSESSMENT & PLAN:  Jacqueline Tyler is a 60 y.o. female with   1. 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 CTdid not show other  malignancy. -She was seen bysurgeon Dr Zenia Resides on 09/06/20,neoadjuvantchemo recommended -Due to the borderline resectable disease, Istarted her onneoadjuvant chemotherapy withFOLFIRINOX q2weekson 10/25/21to downstage her borderline resectable disease for103months (about 8 cycles) before surgery. -S/p C7 she had severe thrombocytopenia. She required blood transfusion on 12/22/20 and next cycle postponed. She was able to tolerate treatment with diarrhea, mild neuropathy, recovered now. Side effects are prolonged but mostly mangle. I reviewed management with her today.  -Labs reviewed, WBC 12.7, Hg 10.7, plt much improved to 141K, ANC 9. Overall adequate to proceed with final C8 FOLFIRINOX tomorrow at reduced dose by 20-40%.  -She was seen by Dr Crisoforo Oxford who offered her surgery, likely to proceed in 6 weeks after last cycle chemo. She will f/u with him after CT scan. I discussed if he is not able to complete surgical resection or she has significant residual disease after surgery, she may require adjuvant radiation or more chemotherapy.  -f/u next week for toxicity check and symptom management.    2. Diarrhea, Weight loss, Hypokalemia -S/p C2 she has had loose BM twice a day which has lead to a BM accident.  -She has imodium, but does not take it. I recommend she use imodium daily prophylactically after chemo infusions. She declined lomotil for now.  -Has persistent hypokalemia,continue 10 mEq TID -Her baseline weight was 160 pounds. She weight continues to slowly trend down.  -Continue high protein, high calorie diet and increase Ensure. Will f/u with dietician.  -I encouraged her to remain active daily to help regain muscle strength.  -She has LE edema, L>R, in the past few days that improve in the AM. I discussed doppler to evaluate for blood clot, she declined for now. Will monitor.   3. Obstructive Jaundice, Transaminitis, Hyperbilirubinuria,Secondary to #1  -Her initial 08/20/20 labs  showed AST 349, ALT 473, alk phos 903 and tbili 7.3. She only had Jaundice of skin and eyes. Otherwise asymptomatic.  -Plastic Stent placed by ERCP on 08/22/20 with Dr Watt Climes. May need stent exchange in the future. -Jaundice resolved,T bili normalized, AST/ALT and alk phos fluctuate on treatment.Stable.   4. HTN, Hyperglycemia  -On Zocor, Clonidine and HCTZ for HTN.HCTZ held from start of chemo due to hypotension.  -Continue to F/u with PCP  5. Financial assistance and Social support, Depression -She notes concern about continuing work when she can for money. She works for [employer] from home. Her husband recently retired but may return to work in near future.  -increased celexa to 20 mg daily recently. Improved and stable.   6. 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 Stable.   7. Ankle edema -L>R -will get Doppler to rule out DVT   PLAN: -Labs reviewed and adequate to proceed with C8 FOLFIRINOX at significant reduced dose tomorrow. Proceed with GCSF at home injection on day 3. -Lab, flush, F/u next week.  -doppler of b/l LE to rule out DVT in next 1-2 days    No problem-specific Assessment & Plan notes found for this encounter.   No orders of the defined types were placed in this encounter.  All questions were answered. The patient knows to call the clinic with any problems, questions or concerns. No barriers to learning was detected. The total time spent in the appointment was 30 minutes.     Truitt Merle, MD 12/28/2020   I, Joslyn Devon, am acting as scribe for Truitt Merle, MD.   I have reviewed the above documentation for accuracy and completeness, and I agree with the above.

## 2020-12-28 NOTE — Patient Instructions (Signed)
Implanted Port Insertion, Care After This sheet gives you information about how to care for yourself after your procedure. Your health care provider may also give you more specific instructions. If you have problems or questions, contact your health care provider. What can I expect after the procedure? After the procedure, it is common to have:  Discomfort at the port insertion site.  Bruising on the skin over the port. This should improve over 3-4 days. Follow these instructions at home: Port care  After your port is placed, you will get a manufacturer's information card. The card has information about your port. Keep this card with you at all times.  Take care of the port as told by your health care provider. Ask your health care provider if you or a family member can get training for taking care of the port at home. A home health care nurse may also take care of the port.  Make sure to remember what type of port you have. Incision care  Follow instructions from your health care provider about how to take care of your port insertion site. Make sure you: ? Wash your hands with soap and water before and after you change your bandage (dressing). If soap and water are not available, use hand sanitizer. ? Change your dressing as told by your health care provider. ? Leave stitches (sutures), skin glue, or adhesive strips in place. These skin closures may need to stay in place for 2 weeks or longer. If adhesive strip edges start to loosen and curl up, you may trim the loose edges. Do not remove adhesive strips completely unless your health care provider tells you to do that.  Check your port insertion site every day for signs of infection. Check for: ? Redness, swelling, or pain. ? Fluid or blood. ? Warmth. ? Pus or a bad smell.      Activity  Return to your normal activities as told by your health care provider. Ask your health care provider what activities are safe for you.  Do not  lift anything that is heavier than 10 lb (4.5 kg), or the limit that you are told, until your health care provider says that it is safe. General instructions  Take over-the-counter and prescription medicines only as told by your health care provider.  Do not take baths, swim, or use a hot tub until your health care provider approves. Ask your health care provider if you may take showers. You may only be allowed to take sponge baths.  Do not drive for 24 hours if you were given a sedative during your procedure.  Wear a medical alert bracelet in case of an emergency. This will tell any health care providers that you have a port.  Keep all follow-up visits as told by your health care provider. This is important. Contact a health care provider if:  You cannot flush your port with saline as directed, or you cannot draw blood from the port.  You have a fever or chills.  You have redness, swelling, or pain around your port insertion site.  You have fluid or blood coming from your port insertion site.  Your port insertion site feels warm to the touch.  You have pus or a bad smell coming from the port insertion site. Get help right away if:  You have chest pain or shortness of breath.  You have bleeding from your port that you cannot control. Summary  Take care of the port as told by your   health care provider. Keep the manufacturer's information card with you at all times.  Change your dressing as told by your health care provider.  Contact a health care provider if you have a fever or chills or if you have redness, swelling, or pain around your port insertion site.  Keep all follow-up visits as told by your health care provider. This information is not intended to replace advice given to you by your health care provider. Make sure you discuss any questions you have with your health care provider. Document Revised: 06/03/2018 Document Reviewed: 06/03/2018 Elsevier Patient Education   2021 Elsevier Inc.  

## 2020-12-28 NOTE — Telephone Encounter (Signed)
Scheduled follow-up appointment per 2/9 los. Patient is aware. °

## 2020-12-28 NOTE — Telephone Encounter (Signed)
I spoke with MS Melle and reviewed her appt date and time for venous doppler tomorrow at [facility].  She verbalized understanding.

## 2020-12-29 ENCOUNTER — Ambulatory Visit (HOSPITAL_BASED_OUTPATIENT_CLINIC_OR_DEPARTMENT_OTHER): Admission: RE | Admit: 2020-12-29 | Payer: BC Managed Care – PPO | Source: Ambulatory Visit

## 2020-12-29 ENCOUNTER — Inpatient Hospital Stay: Payer: BC Managed Care – PPO

## 2020-12-29 VITALS — BP 122/51 | HR 63 | Temp 97.7°F | Resp 16

## 2020-12-29 DIAGNOSIS — Z5111 Encounter for antineoplastic chemotherapy: Secondary | ICD-10-CM | POA: Diagnosis not present

## 2020-12-29 DIAGNOSIS — C250 Malignant neoplasm of head of pancreas: Secondary | ICD-10-CM

## 2020-12-29 MED ORDER — FAMOTIDINE IN NACL 20-0.9 MG/50ML-% IV SOLN
INTRAVENOUS | Status: AC
  Filled 2020-12-29: qty 50

## 2020-12-29 MED ORDER — SODIUM CHLORIDE 0.9 % IV SOLN
2000.0000 mg/m2 | INTRAVENOUS | Status: DC
  Administered 2020-12-29: 3500 mg via INTRAVENOUS
  Filled 2020-12-29: qty 70

## 2020-12-29 MED ORDER — SODIUM CHLORIDE 0.9 % IV SOLN
150.0000 mg | Freq: Once | INTRAVENOUS | Status: AC
  Administered 2020-12-29: 150 mg via INTRAVENOUS
  Filled 2020-12-29: qty 150

## 2020-12-29 MED ORDER — DEXTROSE 5 % IV SOLN
Freq: Once | INTRAVENOUS | Status: AC
  Filled 2020-12-29: qty 250

## 2020-12-29 MED ORDER — DEXAMETHASONE SODIUM PHOSPHATE 10 MG/ML IJ SOLN
INTRAMUSCULAR | Status: AC
  Filled 2020-12-29: qty 1

## 2020-12-29 MED ORDER — SODIUM CHLORIDE 0.9 % IV SOLN
400.0000 mg/m2 | Freq: Once | INTRAVENOUS | Status: AC
  Administered 2020-12-29: 704 mg via INTRAVENOUS
  Filled 2020-12-29: qty 35.2

## 2020-12-29 MED ORDER — ATROPINE SULFATE 1 MG/ML IJ SOLN
INTRAMUSCULAR | Status: AC
  Filled 2020-12-29: qty 1

## 2020-12-29 MED ORDER — DEXAMETHASONE SODIUM PHOSPHATE 10 MG/ML IJ SOLN
8.0000 mg | Freq: Once | INTRAMUSCULAR | Status: AC
  Administered 2020-12-29: 8 mg via INTRAVENOUS

## 2020-12-29 MED ORDER — PALONOSETRON HCL INJECTION 0.25 MG/5ML
INTRAVENOUS | Status: AC
  Filled 2020-12-29: qty 5

## 2020-12-29 MED ORDER — SODIUM CHLORIDE 0.9 % IV SOLN
90.0000 mg/m2 | Freq: Once | INTRAVENOUS | Status: AC
  Administered 2020-12-29: 160 mg via INTRAVENOUS
  Filled 2020-12-29: qty 8

## 2020-12-29 MED ORDER — PALONOSETRON HCL INJECTION 0.25 MG/5ML
0.2500 mg | Freq: Once | INTRAVENOUS | Status: AC
  Administered 2020-12-29: 0.25 mg via INTRAVENOUS

## 2020-12-29 MED ORDER — ATROPINE SULFATE 1 MG/ML IJ SOLN
0.5000 mg | Freq: Once | INTRAMUSCULAR | Status: AC | PRN
  Administered 2020-12-29: 0.5 mg via INTRAVENOUS

## 2020-12-29 MED ORDER — FAMOTIDINE IN NACL 20-0.9 MG/50ML-% IV SOLN
20.0000 mg | Freq: Once | INTRAVENOUS | Status: AC
  Administered 2020-12-29: 20 mg via INTRAVENOUS

## 2020-12-29 MED ORDER — OXALIPLATIN CHEMO INJECTION 100 MG/20ML
40.0000 mg/m2 | Freq: Once | INTRAVENOUS | Status: AC
  Administered 2020-12-29: 70 mg via INTRAVENOUS
  Filled 2020-12-29: qty 14

## 2020-12-29 NOTE — Patient Instructions (Signed)
[facility] Discharge Instructions for Patients Receiving Chemotherapy  Today you received the following chemotherapy agents Oxaliplatin, Leucovorin, Irinotecan.  To help prevent nausea and vomiting after your treatment, we encourage you to take your nausea medication as prescribed by your MD.   If you develop nausea and vomiting that is not controlled by your nausea medication, call the clinic.   BELOW ARE SYMPTOMS THAT SHOULD BE REPORTED IMMEDIATELY:  *FEVER GREATER THAN 100.5 F  *CHILLS WITH OR WITHOUT FEVER  NAUSEA AND VOMITING THAT IS NOT CONTROLLED WITH YOUR NAUSEA MEDICATION  *UNUSUAL SHORTNESS OF BREATH  *UNUSUAL BRUISING OR BLEEDING  TENDERNESS IN MOUTH AND THROAT WITH OR WITHOUT PRESENCE OF ULCERS  *URINARY PROBLEMS  *BOWEL PROBLEMS  UNUSUAL RASH Items with * indicate a potential emergency and should be followed up as soon as possible.  Feel free to call the clinic should you have any questions or concerns. The clinic phone number is [phone].  Please show the [facility] at check-in to the Emergency Department and triage nurse.

## 2020-12-30 ENCOUNTER — Other Ambulatory Visit: Payer: Self-pay

## 2020-12-30 ENCOUNTER — Ambulatory Visit (HOSPITAL_BASED_OUTPATIENT_CLINIC_OR_DEPARTMENT_OTHER)
Admission: RE | Admit: 2020-12-30 | Discharge: 2020-12-30 | Disposition: A | Discharge status: Home / Self Care | Payer: BC Managed Care – PPO | Source: Ambulatory Visit | Attending: Hematology | Admitting: Hematology

## 2020-12-30 DIAGNOSIS — C259 Malignant neoplasm of pancreas, unspecified: Secondary | ICD-10-CM | POA: Insufficient documentation

## 2020-12-31 ENCOUNTER — Inpatient Hospital Stay: Payer: BC Managed Care – PPO

## 2020-12-31 ENCOUNTER — Other Ambulatory Visit: Payer: Self-pay

## 2020-12-31 VITALS — BP 145/81 | HR 76 | Temp 97.3°F | Resp 17

## 2020-12-31 DIAGNOSIS — Z5111 Encounter for antineoplastic chemotherapy: Secondary | ICD-10-CM | POA: Diagnosis not present

## 2020-12-31 DIAGNOSIS — C250 Malignant neoplasm of head of pancreas: Secondary | ICD-10-CM

## 2020-12-31 MED ORDER — HEPARIN SOD (PORK) LOCK FLUSH 100 UNIT/ML IV SOLN
500.0000 [IU] | Freq: Once | INTRAVENOUS | Status: AC | PRN
  Administered 2020-12-31: 500 [IU]
  Filled 2020-12-31: qty 5

## 2020-12-31 MED ORDER — HEPARIN SOD (PORK) LOCK FLUSH 100 UNIT/ML IV SOLN
500.0000 [IU] | INTRAVENOUS | Status: DC | PRN
  Filled 2020-12-31: qty 5

## 2020-12-31 MED ORDER — SODIUM CHLORIDE 0.9% FLUSH
10.0000 mL | INTRAVENOUS | Status: DC | PRN
  Filled 2020-12-31: qty 10

## 2020-12-31 MED ORDER — SODIUM CHLORIDE 0.9% FLUSH
10.0000 mL | INTRAVENOUS | Status: AC | PRN
  Administered 2020-12-31: 10 mL
  Filled 2020-12-31: qty 10

## 2020-12-31 MED ORDER — PEGFILGRASTIM-BMEZ 6 MG/0.6ML ~~LOC~~ SOSY: 6.0000 mg | PREFILLED_SYRINGE | Freq: Once | SUBCUTANEOUS | Status: DC

## 2021-01-01 ENCOUNTER — Telehealth: Payer: Self-pay | Admitting: Hematology

## 2021-01-01 NOTE — Telephone Encounter (Signed)
I called pt and left her a VM: her doppler was negative for LE DVT.   Truitt Merle  01/01/2021

## 2021-01-02 ENCOUNTER — Telehealth: Payer: Self-pay | Admitting: Genetic Counselor

## 2021-01-02 ENCOUNTER — Encounter: Payer: Self-pay | Admitting: Genetic Counselor

## 2021-01-02 ENCOUNTER — Ambulatory Visit: Payer: Self-pay | Admitting: Genetic Counselor

## 2021-01-02 DIAGNOSIS — C250 Malignant neoplasm of head of pancreas: Secondary | ICD-10-CM

## 2021-01-02 DIAGNOSIS — Z1379 Encounter for other screening for genetic and chromosomal anomalies: Secondary | ICD-10-CM | POA: Insufficient documentation

## 2021-01-02 NOTE — Progress Notes (Signed)
HPI:  Ms. January was previously seen in the [place] clinic due to a personal history of cancer and concerns regarding a hereditary predisposition to cancer. Please refer to our prior cancer genetics clinic note for more information regarding our discussion, assessment and recommendations, at the time. Ms. Clemenson's recent genetic test results were disclosed to her, as were recommendations warranted by these results. These results and recommendations are discussed in more detail below.  CANCER HISTORY:  Oncology History Overview Note  Cancer Staging Pancreatic cancer [facility]) Staging form: Exocrine Pancreas, AJCC 8th Edition - Clinical stage from 08/24/2020: Stage IIB (cT2, cN1, cM0) - Signed by Truitt Merle, MD on 08/30/2020    Pancreatic cancer ([place])  08/20/2020 Imaging   CT AP 08/20/20  IMPRESSION: 1. 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 Collateral circulation is noted. There is moderate intrahepatic and extrahepatic biliary dilatation which appears to be due to the pancreatic head mass. Pancreatic ductal dilatation is noted as well. 2. Probable 2.7 cm uterine fibroid. 3. Aortic atherosclerosis.   Aortic Atherosclerosis (ICD10-I70.0).   08/20/2020 Tumor Marker   Ca19-9 - 927   08/22/2020 Procedure   ERCP by Dr Watt Climes 08/22/20  IMPRESSION - The major papilla appeared normal. - A biliary sphincterotomy was performed. - Cells for cytology obtained in the lower third and middle of the main duct. - One plastic stent was placed into the common bile duct.   FINAL MICROSCOPIC DIAGNOSIS:  - No malignant cells identified  - Benign reactive/reparative changes   08/24/2020 Cancer Staging   Staging form: Exocrine Pancreas, AJCC 8th Edition - Clinical stage from 08/24/2020: Stage IIB (cT2, cN1, cM0) -  Signed by Truitt Merle, MD on 08/30/2020   08/24/2020 Procedure   EUS by Dr Paulita Fujita 08/24/20  IMPRESSION - There was no sign of significant pathology in the ampulla. - A few malignant-appearing lymph nodes were visualized in the peripancreatic region and porta hepatis region. - One stent was visualized endosonographically in the common bile duct. - A mass was identified in the pancreatic head. Tissue was obtained from this exam. The preliminary diagnosis is consistent with adenocarcinoma. Invasion into SMV/PV seen. Lymphadenopathy noted. This was staged T3 N1 Mx by endosonographic criteria. Fine needle aspiration performed.   08/24/2020 Initial Biopsy   FINAL MICROSCOPIC DIAGNOSIS: 08/24/20 - Malignant cells consistent with adenocarcinoma    08/30/2020 Initial Diagnosis   Pancreatic cancer ([place])   09/05/2020 Imaging   CT Chest  IMPRESSION: 1. Stable 2.7 cm infiltrating pancreatic head mass with borderline enlarged peripancreatic lymph nodes. 2. No findings for pulmonary metastatic disease. 3. Small hiatal hernia. 4. Aortic atherosclerosis.   Aortic Atherosclerosis (ICD10-I70.0).   09/08/2020 Procedure   INSERTION PORT-A-CATH by Dr Zenia Resides and Barry Dienes    09/12/2020 -  Chemotherapy   Neoadjuvant FOLFIRINOX q2weeks starting 09/12/20    12/02/2020 Imaging   CT CAP  IMPRESSION: 1. Previously noted mass of the central pancreatic head is almost entirely resolved, difficult to discretely appreciate on current examination. Findings are consistent with treatment response. There remains obstruction of the pancreatic duct near the head neck junction with mild prominence of the pancreatic duct, measuring up to 5 mm, with atrophy of the distal pancreatic parenchyma. 2. The portal vein, splenic vein, and superior mesenteric vein are now widely patent, previously effaced  at the confluence by mass effect. 3. Interval placement of common bile duct stent, tip positioned in the distal duodenum,  with relief of previously seen biliary ductal dilatation. 4. 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 Following the bifurcation of the celiac mesenteric trunk, the celiac axis appears to directly traverse the vicinity of the mass, although there does appear to be a fat plane about the vessel. 5. The distal small bowel and colon are diffusely somewhat hyperenhancing and inflamed appearing with vascular combing and a tethered appearance of the distal small bowel. This appearance generally suggests inflammatory bowel disease such as Crohn's disease. Correlate with referable clinical history, if present. No evidence of obstruction or other acute complication. 6. Hepatic steatosis. 7. Aortic atherosclerosis.   12/28/2020 Genetic Testing   Negative hereditary cancer genetic testing: no pathogenic variants detected in [employer] Common Hereditary Cancers Panel.  The report date is December 28, 2020.    The Common Hereditary Cancers Panel offered by [facility] includes sequencing and/or deletion duplication testing of the following 47 genes: APC, ATM, AXIN2, BARD1, BMPR1A, BRCA1, BRCA2, BRIP1, CDH1, CDK4, CDKN2A (p14ARF), CDKN2A (p16INK4a), CHEK2, CTNNA1, DICER1, EPCAM (Deletion/duplication testing only), GREM1 (promoter region deletion/duplication testing only), GREM1, HOXB13, KIT, MEN1, MLH1, MSH2, MSH3, MSH6, MUTYH, NBN, NF1, NHTL1, PALB2, PDGFRA, PMS2, POLD1, POLE, PTEN, RAD50, RAD51C, RAD51D, SDHA, SDHB, SDHC, SDHD, SMAD4, SMARCA4. STK11, TP53, TSC1, TSC2, and VHL.  The following genes were evaluated for sequence changes only: SDHA and HOXB13 c.251G>A variant only.     FAMILY HISTORY:  We obtained a detailed, 4-generation family history.  Significant diagnoses are listed below: Family History  Problem Relation Age of Onset  . Cancer  Cousin        paternal cousin; unknown type; dx >50  . Other Cousin        paternal cousin; brain tumor    Ms. Veronica has one son, age 39. Ms. Rosenberry's mother passed away at age 79.  No maternal family history of cancer was reported.  Ms. Youngman's father was 76 years old without a cancer history.  Ms. Watson's had two paternal cousins with a history of cancer (unknown, brain cancer).   No other paternal family history of cancer was reported.   Ms. Gossen is unaware of previous family history of genetic testing for hereditary cancer risks. Patient's maternal ancestors are of White/Caucaisan descent, and paternal ancestors are of White/Caucasian descent. There is no reported Ashkenazi Jewish ancestry. There is no known consanguinity.   GENETIC TEST RESULTS: Genetic testing reported out on December 28, 2020.  The [facility] Common Hereditary Cancers Panel found no pathogenic mutations.  The Common Hereditary Cancers Panel offered by [facility] includes sequencing and/or deletion duplication testing of the following 47 genes: APC, ATM, AXIN2, BARD1, BMPR1A, BRCA1, BRCA2, BRIP1, CDH1, CDK4, CDKN2A (p14ARF), CDKN2A (p16INK4a), CHEK2, CTNNA1, DICER1, EPCAM (Deletion/duplication testing only), GREM1 (promoter region deletion/duplication testing only), GREM1, HOXB13, KIT, MEN1, MLH1, MSH2, MSH3, MSH6, MUTYH, NBN, NF1, NHTL1, PALB2, PDGFRA, PMS2, POLD1, POLE, PTEN, RAD50, RAD51C, RAD51D, SDHA, SDHB, SDHC, SDHD, SMAD4, SMARCA4. STK11, TP53, TSC1, TSC2, and VHL.  The following genes were evaluated for sequence changes only: SDHA and HOXB13 c.251G>A variant only.es only: SDHA and HOXB13 c.251G>A variant only.  The test report has been scanned into EPIC and is located under the Molecular Pathology section of the Results Review tab.  A portion  of the result report is included below for reference.     We discussed with Ms. Yeley that because current genetic testing is not perfect, it is possible there may be a gene mutation in one of  these genes that current testing cannot detect, but that chance is small.  We also discussed, that there could be another gene that has not yet been discovered, or that we have not yet tested, that is responsible for the cancer diagnoses in the family. It is also possible there is a hereditary cause for the cancer in the family that Ms. Jeanpaul did not inherit and therefore was not identified in her testing.  Therefore, it is important to remain in touch with cancer genetics in the future so that we can continue to offer Ms. Wierzbicki the most up to date genetic testing.    ADDITIONAL GENETIC TESTING: We discussed with Ms. Snowden that there are other genes that are associated with increased cancer risk that can be analyzed. Should Ms. Zinn wish to pursue additional genetic testing, we are happy to discuss and coordinate this testing, at any time.     CANCER SCREENING RECOMMENDATIONS: Ms. Howald's test result is considered negative (normal).  This means that we have not identified a hereditary cause for her personal history of cancer at this time. Most cancers happen by chance and this negative test suggests that her cancer may fall into this category.    While reassuring, this does not definitively rule out a hereditary predisposition to cancer. It is still possible that there could be genetic mutations that are undetectable by current technology. There could be genetic mutations in genes that have not been tested or identified to increase cancer risk.  Therefore, it is recommended she continue to follow the cancer management and screening guidelines provided by her oncology and primary healthcare provider.   An individual's cancer risk and medical management are not determined by genetic test results alone. Overall cancer risk assessment incorporates additional factors, including personal medical history, family history, and any available genetic information that may result in a personalized plan for cancer prevention and  surveillance  RECOMMENDATIONS FOR FAMILY MEMBERS:  Individuals in this family might be at some increased risk of developing cancer, over the general population risk, simply due to the family history of cancer.  We recommended women in this family have a yearly mammogram beginning at age 73, or 82 years younger than the earliest onset of cancer, an annual clinical breast exam, and perform monthly breast self-exams. Women in this family should also have a gynecological exam as recommended by their primary provider. All family members should be referred for colonoscopy starting at age 38.  FOLLOW-UP: Lastly, we discussed with Ms. Ribas that cancer genetics is a rapidly advancing field and it is possible that new genetic tests will be appropriate for her and/or her family members in the future. We encouraged her to remain in contact with cancer genetics on an annual basis so we can update her personal and family histories and let her know of advances in cancer genetics that may benefit this family.   Our contact number was provided. Ms. Lawal's questions were answered to her satisfaction, and she knows she is welcome to call us at anytime with additional questions or concerns.    Usman Millett M. Joette Catching, [place], [facility] Genetic Counselor [email or web address] (P) [phone]

## 2021-01-02 NOTE — Telephone Encounter (Signed)
Revealed negative genetic testing.  Discussed that we do not know why she has pancreatic cancer or why there is cancer in the family. It could be sporadic/familial, due to a different gene that we are not testing, or maybe our current technology may not be able to pick something up.  It will be important for her to keep in contact with genetics to keep up with whether additional testing may be needed.

## 2021-01-02 NOTE — Progress Notes (Signed)
[place]   [phone] [phone]   Clinic Follow up Note   Patient Care Team: Nicola Girt, DO as PCP - General (Internal Medicine) Jonnie Finner, RN as Oncology Nurse Navigator Truitt Merle, MD as Consulting Physician (Oncology) Dwan Bolt, MD as Consulting Physician (General Surgery)  Date of Service:  01/05/2021  CHIEF COMPLAINT: F/u of pancreatic cancer  SUMMARY OF ONCOLOGIC HISTORY: Oncology History Overview Note  Cancer Staging Pancreatic cancer [facility]) Staging form: Exocrine Pancreas, AJCC 8th Edition - Clinical stage from 08/24/2020: Stage IIB (cT2, cN1, cM0) - Signed by Truitt Merle, MD on 08/30/2020    Pancreatic cancer ([place])  08/20/2020 Imaging   CT AP 08/20/20  IMPRESSION: 1. 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 Collateral circulation is noted. There is moderate intrahepatic and extrahepatic biliary dilatation which appears to be due to the pancreatic head mass. Pancreatic ductal dilatation is noted as well. 2. Probable 2.7 cm uterine fibroid. 3. Aortic atherosclerosis.   Aortic Atherosclerosis (ICD10-I70.0).   08/20/2020 Tumor Marker   Ca19-9 - 927   08/22/2020 Procedure   ERCP by Dr Watt Climes 08/22/20  IMPRESSION - The major papilla appeared normal. - A biliary sphincterotomy was performed. - Cells for cytology obtained in the lower third and middle of the main duct. - One plastic stent was placed into the common bile duct.   FINAL MICROSCOPIC DIAGNOSIS:  - No malignant cells identified  - Benign reactive/reparative changes   08/24/2020 Cancer Staging   Staging form: Exocrine Pancreas, AJCC 8th Edition - Clinical stage from 08/24/2020: Stage IIB (cT2, cN1, cM0) - Signed by Truitt Merle, MD on 08/30/2020   08/24/2020 Procedure    EUS by Dr Paulita Fujita 08/24/20  IMPRESSION - There was no sign of significant pathology in the ampulla. - A few malignant-appearing lymph nodes were visualized in the peripancreatic region and porta hepatis region. - One stent was visualized endosonographically in the common bile duct. - A mass was identified in the pancreatic head. Tissue was obtained from this exam. The preliminary diagnosis is consistent with adenocarcinoma. Invasion into SMV/PV seen. Lymphadenopathy noted. This was staged T3 N1 Mx by endosonographic criteria. Fine needle aspiration performed.   08/24/2020 Initial Biopsy   FINAL MICROSCOPIC DIAGNOSIS: 08/24/20 - Malignant cells consistent with adenocarcinoma    08/30/2020 Initial Diagnosis   Pancreatic cancer ([place])   09/05/2020 Imaging   CT Chest  IMPRESSION: 1. Stable 2.7 cm infiltrating pancreatic head mass with borderline enlarged peripancreatic lymph nodes. 2. No findings for pulmonary metastatic disease. 3. Small hiatal hernia. 4. Aortic atherosclerosis.   Aortic Atherosclerosis (ICD10-I70.0).   09/08/2020 Procedure   INSERTION PORT-A-CATH by Dr Zenia Resides and Barry Dienes    09/12/2020 - 12/29/2020 Chemotherapy   Neoadjuvant FOLFIRINOX q2 weeks starting 09/12/20-12/29/20    12/02/2020 Imaging   CT CAP  IMPRESSION: 1. Previously noted mass of the central pancreatic head is almost entirely resolved, difficult to discretely appreciate on current examination. Findings are consistent with treatment response. There remains obstruction of the pancreatic duct near the head neck junction with mild prominence of the pancreatic duct, measuring up to 5 mm, with atrophy of the distal pancreatic parenchyma. 2. The portal vein, splenic vein, and superior mesenteric vein are now widely patent, previously effaced at the confluence by mass effect. 3.  Interval placement of common bile duct stent, tip positioned in the distal duodenum, with relief of previously seen biliary  ductal dilatation. 4. 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 Following the bifurcation of the celiac mesenteric trunk, the celiac axis appears to directly traverse the vicinity of the mass, although there does appear to be a fat plane about the vessel. 5. The distal small bowel and colon are diffusely somewhat hyperenhancing and inflamed appearing with vascular combing and a tethered appearance of the distal small bowel. This appearance generally suggests inflammatory bowel disease such as Crohn's disease. Correlate with referable clinical history, if present. No evidence of obstruction or other acute complication. 6. Hepatic steatosis. 7. Aortic atherosclerosis.   12/28/2020 Genetic Testing   Negative hereditary cancer genetic testing: no pathogenic variants detected in [facility] Common Hereditary Cancers Panel.  The report date is December 28, 2020.    The Common Hereditary Cancers Panel offered by [facility] includes sequencing and/or deletion duplication testing of the following 47 genes: APC, ATM, AXIN2, BARD1, BMPR1A, BRCA1, BRCA2, BRIP1, CDH1, CDK4, CDKN2A (p14ARF), CDKN2A (p16INK4a), CHEK2, CTNNA1, DICER1, EPCAM (Deletion/duplication testing only), GREM1 (promoter region deletion/duplication testing only), GREM1, HOXB13, KIT, MEN1, MLH1, MSH2, MSH3, MSH6, MUTYH, NBN, NF1, NHTL1, PALB2, PDGFRA, PMS2, POLD1, POLE, PTEN, RAD50, RAD51C, RAD51D, SDHA, SDHB, SDHC, SDHD, SMAD4, SMARCA4. STK11, TP53, TSC1, TSC2, and VHL.  The following genes were evaluated for sequence changes only: SDHA and HOXB13 c.251G>A variant only.      CURRENT THERAPY:  Neoadjuvant FOLFIRINOX q2 weeks starting 09/12/20-12/29/20  INTERVAL HISTORY:  MYRIAN BOTELLO is here for a follow up. She presents to the clinic with her husband  She tolerated last cycle  better overall, less fatigued  Cloudy urine for the past 3-4 days, but no dysuria or urinary frequency  No fever or bleeding   All other systems were reviewed with the patient and are negative.  MEDICAL HISTORY:  Past Medical History:  Diagnosis Date  . Anxiety   . Cancer ([place]) 07/2020   pancreatic cancer  . Depression   . Diabetes mellitus without complication ([place]) 41/9379   pancreatic cancer  . High cholesterol   . Hypertension     SURGICAL HISTORY: Past Surgical History:  Procedure Laterality Date  . BILIARY BRUSHING  08/22/2020   Procedure: BILIARY BRUSHING;  Surgeon: Clarene Essex, MD;  Location: [facility];  Service: Endoscopy;;  . BILIARY STENT PLACEMENT N/A 08/22/2020   Procedure: BILIARY STENT PLACEMENT;  Surgeon: Clarene Essex, MD;  Location: [facility];  Service: Endoscopy;  Laterality: N/A;  . ENDOSCOPIC RETROGRADE CHOLANGIOPANCREATOGRAPHY (ERCP) WITH PROPOFOL N/A 08/22/2020   Procedure: ENDOSCOPIC RETROGRADE CHOLANGIOPANCREATOGRAPHY (ERCP) WITH PROPOFOL;  Surgeon: Clarene Essex, MD;  Location: [facility];  Service: Endoscopy;  Laterality: N/A;  . ESOPHAGOGASTRODUODENOSCOPY (EGD) WITH PROPOFOL N/A 08/24/2020   Procedure: ESOPHAGOGASTRODUODENOSCOPY (EGD) WITH PROPOFOL;  Surgeon: Arta Silence, MD;  Location: [facility];  Service: Endoscopy;  Laterality: N/A;  . FINE NEEDLE ASPIRATION N/A 08/24/2020   Procedure: FINE NEEDLE ASPIRATION (FNA) LINEAR;  Surgeon: Arta Silence, MD;  Location: [facility];  Service: Endoscopy;  Laterality: N/A;  . PORTACATH PLACEMENT Right 09/08/2020   Procedure: INSERTION PORT-A-CATH;  Surgeon: Dwan Bolt, MD;  Location: [place];  Service: General;  Laterality: Right;  . SPHINCTEROTOMY  08/22/2020   Procedure: SPHINCTEROTOMY;  Surgeon: Clarene Essex, MD;  Location: [facility];  Service: Endoscopy;;  . UPPER ESOPHAGEAL ENDOSCOPIC ULTRASOUND (EUS) N/A 08/24/2020   Procedure: UPPER ESOPHAGEAL ENDOSCOPIC ULTRASOUND (EUS);   Surgeon: Arta Silence, MD;  Location: [facility];  Service: Endoscopy;  Laterality: N/A;    I have reviewed the social history and family history with the patient and they are unchanged from previous note.  ALLERGIES:  is allergic to oxycodone-acetaminophen, poison ivy extract, and tyloxapol.  MEDICATIONS:  Current Outpatient Medications  Medication Sig Dispense Refill  . acetaminophen (TYLENOL) 325 MG tablet Take 650 mg by mouth every 6 (six) hours as needed.    . citalopram (CELEXA) 40 MG tablet Take 10 mg by mouth daily.     . cloNIDine (CATAPRES) 0.1 MG tablet Take 0.1 mg by mouth at bedtime.    . hydrochlorothiazide (HYDRODIURIL) 12.5 MG tablet Take 12.5 mg by mouth daily.    Marland Kitchen lidocaine-prilocaine (EMLA) cream Apply to affected area once 30 g 3  . magnesium oxide (MAG-OX) 400 (241.3 Mg) MG tablet Take 1 tablet (400 mg total) by mouth 2 (two) times daily. 60 tablet 0  . omeprazole (PRILOSEC) 20 MG capsule Take 20 mg by mouth daily.    . ondansetron (ZOFRAN) 8 MG tablet Take 1 tablet (8 mg total) by mouth 2 (two) times daily as needed. Start on day 3 after chemotherapy. 30 tablet 1  . pegfilgrastim-bmez (ZIEXTENZO) 6 MG/0.6ML injection Inject 6 mg into the skin every 14 (fourteen) days.    . potassium chloride (KLOR-CON) 10 MEQ tablet Take 1 tablet (10 mEq total) by mouth 3 (three) times daily. 90 tablet 0  . prochlorperazine (COMPAZINE) 10 MG tablet Take 1 tablet (10 mg total) by mouth every 6 (six) hours as needed (Nausea or vomiting). 30 tablet 1  . simvastatin (ZOCOR) 80 MG tablet Take 80 mg by mouth daily.     No current facility-administered medications for this visit.    PHYSICAL EXAMINATION: ECOG PERFORMANCE STATUS: 2 - Symptomatic, <50% confined to bed  Vitals:   01/05/21 0828  BP: 120/73  Pulse: 84  Resp: 16  Temp: 97.6 F (36.4 C)  SpO2: 100%   Filed Weights   01/05/21 0828  Weight: 128 lb 12.8 oz (58.4 kg)    GENERAL:alert, no distress and  comfortable SKIN: skin color, texture, turgor are normal, no rashes or significant lesions, no petechiae or ecchymosis EYES: normal, Conjunctiva are pink and non-injected, sclera clear Musculoskeletal:no cyanosis of digits and no clubbing, no leg edema NEURO: alert & oriented x 3 with fluent speech, no focal motor/sensory deficits  LABORATORY DATA:  I have reviewed the data as listed CBC Latest Ref Rng & Units 01/05/2021 12/28/2020 12/22/2020  WBC 4.0 - 10.5 K/uL 1.7(L) 12.7(H) 14.9(H)  Hemoglobin 12.0 - 15.0 g/dL 9.0(L) 10.7(L) 9.9(L)  Hematocrit 36.0 - 46.0 % 27.8(L) 33.8(L) 30.0(L)  Platelets 150 - 400 K/uL 20(L) 141(L) 31(L)     CMP Latest Ref Rng & Units 01/05/2021 12/28/2020 12/22/2020  Glucose 70 - 99 mg/dL 128(H) 125(H) 114(H)  BUN 8 - 23 mg/dL 13 8 7(L)  Creatinine 0.44 - 1.00 mg/dL 0.68 0.78 0.76  Sodium 135 - 145 mmol/L 135 140 143  Potassium 3.5 - 5.1 mmol/L 4.2 3.8 3.1(L)  Chloride 98 - 111 mmol/L 107 110 110  CO2 22 - 32 mmol/L _0 Calcium 8.9 - 10.3 mg/dL 8.3(L) 8.2(L) 8.3(L)  Total Protein 6.5 - 8.1 g/dL 6.0(L) 5.6(L) 5.6(L)  Total Bilirubin 0.3 - 1.2 mg/dL 1.0 0.6 0.5  Alkaline  Phos 38 - 126 U/L 519(H) 634(H) 521(H)  AST 15 - 41 U/L 108(H) 166(H) 66(H)  ALT 0 - 44 U/L 88(H) 108(H) 51(H)      RADIOGRAPHIC STUDIES: I have personally reviewed the radiological images as listed and agreed with the findings in the report. No results found.   ASSESSMENT & PLAN:  Jacqueline Tyler is a 60 y.o. female with   1. 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 CTdid not show other malignancy. -She was seen bysurgeon Dr Zenia Resides on 09/06/20,neoadjuvantchemo recommended -Due to  the borderline resectable disease, Istarted her onneoadjuvant chemotherapy withFOLFIRINOX q2weekson 09/12/20-2/10/22to downstage her borderline resectable disease.  -She has completed 4 months neoadjuvant chemotherapy as planned, tolerated very well, developed severe for last 2 cycles, required dose reduction -She was seen by Dr Crisoforo Oxford who offered her surgery, likely to proceed in 6 weeks after last cycle chemo. She will f/u with him after CT scan on 3/1. I previously discussed if he is not able to complete surgical resection or she has significant residual disease after surgery, she may require adjuvant radiation or more chemotherapy.  -I reviewed today's lab, she again developed severe thrombocytopenia with platelets of 20K today, will repeat CBC tomorrow, platelet transfusion if platelet less than 20K tomorrow  -f/u in 2 weeks    2. Diarrhea, Weight loss, Hypokalemia -S/p C2 she has had loose BM twice a day which has lead to a BM accident.  -She has imodium, but does not take it. I recommend she use imodium daily prophylactically after chemo infusions. She declined lomotil for now.  -Has persistent hypokalemia,continue 10 mEq TID -weight is stable lately  -I encouraged her to remain active daily to help regain muscle strength.  -She has LE edema, L>R, in the past few days that improve in the AM.  Doppler last week was negative for DVT  3. Obstructive Jaundice, Transaminitis, Hyperbilirubinuria,Secondary to #1  -Her initial 08/20/20 labs showed AST 349, ALT 473, alk phos 903 and tbili 7.3. She only had Jaundice of skin and eyes. Otherwise asymptomatic.  -Plastic Stent placed by ERCP on 08/22/20 with Dr Watt Climes. May need stent exchange in the future. -Jaundice resolved,T bili normalized, AST/ALT and alk phos fluctuate on treatment.Stable.  4. HTN, Hyperglycemia  -On Zocor, Clonidine and HCTZ for HTN.HCTZ held from start of chemo due to hypotension.  -Continue to F/u with PCP  5.  Financial assistance and Social support, Depression -She notes concern about continuing work when she can for money. She works for [employer] from home. Her husband recently retired but may return to work in near future.  -increased celexa to 20 mg daily recently. Improvedand stable.  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 Stable.   7.  Pancytopenia, secondary to chemotherapy -Repeat CBC tomorrow  PLAN: -repeat CBC tomorrow, platelet transfusion if platelet less than 20K tomorrow  -lab next Tuesday  -lab and f/u in 2 weeks -she is going to have a restaging CT and see Dr. Crisoforo [facility] at [facility] on March 1   No problem-specific [place] notes found for this encounter.   No orders of the defined types were placed in this encounter.  All questions were answered. The patient knows to call the clinic with any  problems, questions or concerns. No barriers to learning was detected. The total time spent in the appointment was 30 minutes.     Truitt Merle, MD 01/05/2021   I, Joslyn Devon, am acting as scribe for Truitt Merle, MD.   I have reviewed the above documentation for accuracy and completeness, and I agree with the above.

## 2021-01-03 ENCOUNTER — Encounter: Payer: Self-pay | Admitting: Hematology

## 2021-01-03 NOTE — Progress Notes (Signed)
Patient and spouse called regarding [employer] questions. Advised I would review grant and call them back.  Called patient and spouse to advise details of [place] and sent them a copy of the approval letter via email. They confirmed receipt.  Answered questions regarding grant balance and how claims are submitted. They verbalized understanding.  They have my card for any additional financial questions or concerns.

## 2021-01-05 ENCOUNTER — Inpatient Hospital Stay: Payer: BC Managed Care – PPO

## 2021-01-05 ENCOUNTER — Telehealth: Payer: Self-pay | Admitting: Hematology

## 2021-01-05 ENCOUNTER — Other Ambulatory Visit: Payer: Self-pay

## 2021-01-05 ENCOUNTER — Inpatient Hospital Stay (HOSPITAL_BASED_OUTPATIENT_CLINIC_OR_DEPARTMENT_OTHER): Payer: BC Managed Care – PPO | Admitting: Hematology

## 2021-01-05 ENCOUNTER — Encounter: Payer: Self-pay | Admitting: Hematology

## 2021-01-05 VITALS — BP 120/73 | HR 84 | Temp 97.6°F | Resp 16 | Ht 62.0 in | Wt 128.8 lb

## 2021-01-05 DIAGNOSIS — C259 Malignant neoplasm of pancreas, unspecified: Secondary | ICD-10-CM

## 2021-01-05 DIAGNOSIS — Z5111 Encounter for antineoplastic chemotherapy: Secondary | ICD-10-CM | POA: Diagnosis not present

## 2021-01-05 DIAGNOSIS — D6489 Other specified anemias: Secondary | ICD-10-CM

## 2021-01-05 DIAGNOSIS — D696 Thrombocytopenia, unspecified: Secondary | ICD-10-CM

## 2021-01-05 DIAGNOSIS — C250 Malignant neoplasm of head of pancreas: Secondary | ICD-10-CM

## 2021-01-05 DIAGNOSIS — Z95828 Presence of other vascular implants and grafts: Secondary | ICD-10-CM

## 2021-01-05 LAB — CMP (CANCER CENTER ONLY)
ALT: 88 U/L — ABNORMAL HIGH (ref 0–44)
AST: 108 U/L — ABNORMAL HIGH (ref 15–41)
Albumin: 3 g/dL — ABNORMAL LOW (ref 3.5–5.0)
Alkaline Phosphatase: 519 U/L — ABNORMAL HIGH (ref 38–126)
Anion gap: 6 (ref 5–15)
BUN: 13 mg/dL (ref 8–23)
CO2: 22 mmol/L (ref 22–32)
Calcium: 8.3 mg/dL — ABNORMAL LOW (ref 8.9–10.3)
Chloride: 107 mmol/L (ref 98–111)
Creatinine: 0.68 mg/dL (ref 0.44–1.00)
GFR, Estimated: >60 mL/min (ref >60)
Glucose, Bld: 128 mg/dL — ABNORMAL HIGH (ref 70–99)
Potassium: 4.2 mmol/L (ref 3.5–5.1)
Sodium: 135 mmol/L (ref 135–145)
Total Bilirubin: 1 mg/dL (ref 0.3–1.2)
Total Protein: 6 g/dL — ABNORMAL LOW (ref 6.5–8.1)

## 2021-01-05 LAB — CBC WITH DIFFERENTIAL (CANCER CENTER ONLY)
Abs Immature Granulocytes: 0 10*3/uL (ref 0.00–0.07)
Basophils Absolute: 0 10*3/uL (ref 0.0–0.1)
Basophils Relative: 1 %
Eosinophils Absolute: 0 10*3/uL (ref 0.0–0.5)
Eosinophils Relative: 1 %
HCT: 27.8 % — ABNORMAL LOW (ref 36.0–46.0)
Hemoglobin: 9 g/dL — ABNORMAL LOW (ref 12.0–15.0)
Immature Granulocytes: 0 %
Lymphocytes Relative: 52 %
Lymphs Abs: 0.9 10*3/uL (ref 0.7–4.0)
MCH: 30.6 pg (ref 26.0–34.0)
MCHC: 32.4 g/dL (ref 30.0–36.0)
MCV: 94.6 fL (ref 80.0–100.0)
Monocytes Absolute: 0 10*3/uL — ABNORMAL LOW (ref 0.1–1.0)
Monocytes Relative: 2 %
Neutro Abs: 0.7 10*3/uL — ABNORMAL LOW (ref 1.7–7.7)
Neutrophils Relative %: 44 %
Platelet Count: 20 10*3/uL — ABNORMAL LOW (ref 150–400)
RBC: 2.94 MIL/uL — ABNORMAL LOW (ref 3.87–5.11)
RDW: 18.8 % — ABNORMAL HIGH (ref 11.5–15.5)
WBC Count: 1.7 10*3/uL — ABNORMAL LOW (ref 4.0–10.5)
nRBC: 0 % (ref 0.0–0.2)

## 2021-01-05 LAB — SAMPLE TO BLOOD BANK

## 2021-01-05 MED ORDER — HEPARIN SOD (PORK) LOCK FLUSH 100 UNIT/ML IV SOLN
500.0000 [IU] | Freq: Once | INTRAVENOUS | Status: AC | PRN
  Administered 2021-01-05: 500 [IU]
  Filled 2021-01-05: qty 5

## 2021-01-05 MED ORDER — SODIUM CHLORIDE 0.9% FLUSH
10.0000 mL | INTRAVENOUS | Status: DC | PRN
  Administered 2021-01-05: 10 mL
  Filled 2021-01-05: qty 10

## 2021-01-05 NOTE — Telephone Encounter (Signed)
Scheduled appointments per 2/17 los. Spoke to patient who is aware of appointments dates and times. Gave patient calendar print out.  

## 2021-01-05 NOTE — Progress Notes (Signed)
I spoke with Jacqueline Tyler regarding appts 2/18.  She verbalized understanding.

## 2021-01-06 ENCOUNTER — Other Ambulatory Visit: Payer: Self-pay

## 2021-01-06 ENCOUNTER — Inpatient Hospital Stay: Payer: BC Managed Care – PPO

## 2021-01-06 DIAGNOSIS — Z5111 Encounter for antineoplastic chemotherapy: Secondary | ICD-10-CM | POA: Diagnosis not present

## 2021-01-06 DIAGNOSIS — C250 Malignant neoplasm of head of pancreas: Secondary | ICD-10-CM

## 2021-01-06 DIAGNOSIS — D696 Thrombocytopenia, unspecified: Secondary | ICD-10-CM

## 2021-01-06 DIAGNOSIS — Z95828 Presence of other vascular implants and grafts: Secondary | ICD-10-CM

## 2021-01-06 LAB — CBC WITH DIFFERENTIAL (CANCER CENTER ONLY)
Abs Immature Granulocytes: 0.01 10*3/uL (ref 0.00–0.07)
Basophils Absolute: 0 10*3/uL (ref 0.0–0.1)
Basophils Relative: 1 %
Eosinophils Absolute: 0 10*3/uL (ref 0.0–0.5)
Eosinophils Relative: 0 %
HCT: 26 % — ABNORMAL LOW (ref 36.0–46.0)
Hemoglobin: 8.4 g/dL — ABNORMAL LOW (ref 12.0–15.0)
Immature Granulocytes: 0 %
Lymphocytes Relative: 49 %
Lymphs Abs: 1.2 10*3/uL (ref 0.7–4.0)
MCH: 30.3 pg (ref 26.0–34.0)
MCHC: 32.3 g/dL (ref 30.0–36.0)
MCV: 93.9 fL (ref 80.0–100.0)
Monocytes Absolute: 0.1 10*3/uL (ref 0.1–1.0)
Monocytes Relative: 2 %
Neutro Abs: 1.2 10*3/uL — ABNORMAL LOW (ref 1.7–7.7)
Neutrophils Relative %: 48 %
Platelet Count: 13 10*3/uL — ABNORMAL LOW (ref 150–400)
RBC: 2.77 MIL/uL — ABNORMAL LOW (ref 3.87–5.11)
RDW: 18.4 % — ABNORMAL HIGH (ref 11.5–15.5)
WBC Count: 2.5 10*3/uL — ABNORMAL LOW (ref 4.0–10.5)
nRBC: 0 % (ref 0.0–0.2)

## 2021-01-06 MED ORDER — SODIUM CHLORIDE 0.9% FLUSH
10.0000 mL | INTRAVENOUS | Status: AC | PRN
  Administered 2021-01-06: 10 mL
  Filled 2021-01-06: qty 10

## 2021-01-06 MED ORDER — SODIUM CHLORIDE 0.9% IV SOLUTION
250.0000 mL | Freq: Once | INTRAVENOUS | Status: AC
  Administered 2021-01-06: 250 mL via INTRAVENOUS
  Filled 2021-01-06: qty 250

## 2021-01-06 MED ORDER — SODIUM CHLORIDE 0.9% FLUSH
10.0000 mL | INTRAVENOUS | Status: DC | PRN
  Administered 2021-01-06: 10 mL
  Filled 2021-01-06: qty 10

## 2021-01-06 MED ORDER — HEPARIN SOD (PORK) LOCK FLUSH 100 UNIT/ML IV SOLN
500.0000 [IU] | Freq: Every day | INTRAVENOUS | Status: AC | PRN
  Administered 2021-01-06: 500 [IU]
  Filled 2021-01-06: qty 5

## 2021-01-06 NOTE — Progress Notes (Unsigned)
Platelet orders

## 2021-01-06 NOTE — Patient Instructions (Signed)

## 2021-01-07 LAB — PREPARE PLATELET PHERESIS: Unit division: 0

## 2021-01-07 LAB — BPAM PLATELET PHERESIS
Blood Product Expiration Date: 202202192359
ISSUE DATE / TIME: 202202180939
Unit Type and Rh: 5100

## 2021-01-10 ENCOUNTER — Other Ambulatory Visit: Payer: Self-pay

## 2021-01-10 ENCOUNTER — Telehealth: Payer: Self-pay

## 2021-01-10 ENCOUNTER — Inpatient Hospital Stay: Payer: BC Managed Care – PPO

## 2021-01-10 DIAGNOSIS — D696 Thrombocytopenia, unspecified: Secondary | ICD-10-CM

## 2021-01-10 DIAGNOSIS — Z5111 Encounter for antineoplastic chemotherapy: Secondary | ICD-10-CM | POA: Diagnosis not present

## 2021-01-10 DIAGNOSIS — C250 Malignant neoplasm of head of pancreas: Secondary | ICD-10-CM

## 2021-01-10 DIAGNOSIS — D649 Anemia, unspecified: Secondary | ICD-10-CM

## 2021-01-10 DIAGNOSIS — C259 Malignant neoplasm of pancreas, unspecified: Secondary | ICD-10-CM

## 2021-01-10 DIAGNOSIS — Z95828 Presence of other vascular implants and grafts: Secondary | ICD-10-CM

## 2021-01-10 DIAGNOSIS — D6489 Other specified anemias: Secondary | ICD-10-CM

## 2021-01-10 LAB — CBC WITH DIFFERENTIAL (CANCER CENTER ONLY)
Abs Immature Granulocytes: 0 10*3/uL (ref 0.00–0.07)
Basophils Absolute: 0 10*3/uL (ref 0.0–0.1)
Basophils Relative: 1 %
Eosinophils Absolute: 0 10*3/uL (ref 0.0–0.5)
Eosinophils Relative: 0 %
HCT: 22.9 % — ABNORMAL LOW (ref 36.0–46.0)
Hemoglobin: 7.3 g/dL — ABNORMAL LOW (ref 12.0–15.0)
Immature Granulocytes: 0 %
Lymphocytes Relative: 67 %
Lymphs Abs: 0.9 10*3/uL (ref 0.7–4.0)
MCH: 30.7 pg (ref 26.0–34.0)
MCHC: 31.9 g/dL (ref 30.0–36.0)
MCV: 96.2 fL (ref 80.0–100.0)
Monocytes Absolute: 0.1 10*3/uL (ref 0.1–1.0)
Monocytes Relative: 4 %
Neutro Abs: 0.4 10*3/uL — CL (ref 1.7–7.7)
Neutrophils Relative %: 28 %
Platelet Count: 5 10*3/uL — CL (ref 150–400)
RBC: 2.38 MIL/uL — ABNORMAL LOW (ref 3.87–5.11)
RDW: 17.4 % — ABNORMAL HIGH (ref 11.5–15.5)
WBC Count: 1.4 10*3/uL — ABNORMAL LOW (ref 4.0–10.5)
nRBC: 0 % (ref 0.0–0.2)

## 2021-01-10 LAB — SAMPLE TO BLOOD BANK

## 2021-01-10 LAB — CMP (CANCER CENTER ONLY)
ALT: 61 U/L — ABNORMAL HIGH (ref 0–44)
AST: 69 U/L — ABNORMAL HIGH (ref 15–41)
Albumin: 3 g/dL — ABNORMAL LOW (ref 3.5–5.0)
Alkaline Phosphatase: 434 U/L — ABNORMAL HIGH (ref 38–126)
Anion gap: 7 (ref 5–15)
BUN: 8 mg/dL (ref 8–23)
CO2: 22 mmol/L (ref 22–32)
Calcium: 8.3 mg/dL — ABNORMAL LOW (ref 8.9–10.3)
Chloride: 110 mmol/L (ref 98–111)
Creatinine: 0.69 mg/dL (ref 0.44–1.00)
GFR, Estimated: >60 mL/min (ref >60)
Glucose, Bld: 133 mg/dL — ABNORMAL HIGH (ref 70–99)
Potassium: 3.7 mmol/L (ref 3.5–5.1)
Sodium: 139 mmol/L (ref 135–145)
Total Bilirubin: 0.6 mg/dL (ref 0.3–1.2)
Total Protein: 6 g/dL — ABNORMAL LOW (ref 6.5–8.1)

## 2021-01-10 LAB — PREPARE RBC (CROSSMATCH)

## 2021-01-10 MED ORDER — SODIUM CHLORIDE 0.9% FLUSH
10.0000 mL | INTRAVENOUS | Status: AC | PRN
  Administered 2021-01-10: 10 mL
  Filled 2021-01-10: qty 10

## 2021-01-10 MED ORDER — SODIUM CHLORIDE 0.9% IV SOLUTION
250.0000 mL | Freq: Once | INTRAVENOUS | Status: AC
  Administered 2021-01-10: 250 mL via INTRAVENOUS
  Filled 2021-01-10: qty 250

## 2021-01-10 MED ORDER — ACETAMINOPHEN 325 MG PO TABS
650.0000 mg | ORAL_TABLET | Freq: Once | ORAL | Status: AC
  Administered 2021-01-10: 650 mg via ORAL

## 2021-01-10 MED ORDER — SODIUM CHLORIDE 0.9% FLUSH
10.0000 mL | INTRAVENOUS | Status: DC | PRN
  Administered 2021-01-10: 10 mL
  Filled 2021-01-10: qty 10

## 2021-01-10 MED ORDER — HEPARIN SOD (PORK) LOCK FLUSH 100 UNIT/ML IV SOLN
500.0000 [IU] | Freq: Once | INTRAVENOUS | Status: AC | PRN
  Administered 2021-01-10: 500 [IU]
  Filled 2021-01-10: qty 5

## 2021-01-10 MED ORDER — HEPARIN SOD (PORK) LOCK FLUSH 100 UNIT/ML IV SOLN
500.0000 [IU] | Freq: Every day | INTRAVENOUS | Status: AC | PRN
  Administered 2021-01-10: 500 [IU]
  Filled 2021-01-10: qty 5

## 2021-01-10 MED ORDER — ACETAMINOPHEN 325 MG PO TABS
ORAL_TABLET | ORAL | Status: AC
  Filled 2021-01-10: qty 2

## 2021-01-10 MED ORDER — DIPHENHYDRAMINE HCL 25 MG PO CAPS
ORAL_CAPSULE | ORAL | Status: AC
  Filled 2021-01-10: qty 1

## 2021-01-10 MED ORDER — DIPHENHYDRAMINE HCL 25 MG PO CAPS
25.0000 mg | ORAL_CAPSULE | Freq: Once | ORAL | Status: AC
  Administered 2021-01-10: 25 mg via ORAL

## 2021-01-10 NOTE — Telephone Encounter (Signed)
Critical value Plts 5,000 Hgb 7.3  [facility] Dr Burr Medico notified. Pt to receive plts and RBC today in [facility]

## 2021-01-10 NOTE — Patient Instructions (Signed)
Platelet Transfusion A platelet transfusion is a procedure in which you receive donated platelets through an IV. Platelets are tiny pieces of blood cells. When you get an injury, platelets clump together in the area to form a blood clot. This helps stop bleeding and is the beginning of the healing process. If you have too few platelets, your blood may have trouble clotting. This may cause you to bleed and bruise very easily. You may need a platelet transfusion if you have a condition that causes a low number of platelets (thrombocytopenia). A platelet transfusion may be used to stop or prevent excessive bleeding. Tell a health care provider about:  Any reactions you have had during previous transfusions.  Any allergies you have.  All medicines you are taking, including vitamins, herbs, eye drops, creams, and over-the-counter medicines.  Any blood disorders you have.  Any surgeries you have had.  Any medical conditions you have.  Whether you are pregnant or may be pregnant. What are the risks? Generally, this is a safe procedure. However, problems may occur, including:  Fever.  Infection.  Allergic reaction to the donor platelets.  Your body's disease-fighting system (immune system) attacking the donor platelets (hemolytic reaction). This is rare.  A rare reaction that causes lung damage (transfusion-related acute lung injury). What happens before the procedure? Medicines  Ask your health care provider about: ? Changing or stopping your regular medicines. This is especially important if you are taking diabetes medicines or blood thinners. ? Taking medicines such as aspirin and ibuprofen. These medicines can thin your blood. Do not take these medicines unless your health care provider tells you to take them. ? Taking over-the-counter medicines, vitamins, herbs, and supplements. General instructions  You will have a blood test to determine your blood type. Your blood type  determines what kind of platelets you will be given.  Follow instructions from your health care provider about eating or drinking restrictions.  If you have had an allergic reaction to a transfusion in the past, you may be given medicine to help prevent a reaction.  Your temperature, blood pressure, pulse, and breathing will be monitored. What happens during the procedure?  An IV will be inserted into one of your veins.  For your safety, two health care providers will verify your identity along with the donor platelets about to be infused.  A bag of donor platelets will be connected to your IV. The platelets will flow into your bloodstream. This usually takes 30-60 minutes.  Your temperature, blood pressure, pulse, and breathing will be monitored during the transfusion. This helps detect early signs of any reaction.  You will also be monitored for other symptoms that may indicate a reaction, including chills, hives, or itching.  If you have signs of a reaction at any time, your transfusion will be stopped, and you may be given medicine to help manage the reaction.  When your transfusion is complete, your IV will be removed.  Pressure may be applied to the IV site for a few minutes to stop any bleeding.  The IV site will be covered with a bandage (dressing). The procedure may vary among health care providers and hospitals.   What happens after the procedure?  Your blood pressure, temperature, pulse, and breathing will be monitored until you leave the hospital or clinic.  You may have some bruising and soreness at your IV site. Follow these instructions at home: Medicines  Take over-the-counter and prescription medicines only as told by your health care   provider.  Talk with your health care provider before you take any medicines that contain aspirin or NSAIDs. These medicines increase your risk for dangerous bleeding. General instructions  Change or remove your dressing as told  by your health care provider.  Return to your normal activities as told by your health care provider. Ask your health care provider what activities are safe for you.  Do not take baths, swim, or use a hot tub until your health care provider approves. Ask your health care provider if you may take showers.  Check your IV site every day for signs of infection. Check for: ? Redness, swelling, or pain. ? Fluid or blood. If fluid or blood drains from your IV site, use your hands to press down firmly on a bandage covering the area for a minute or two. Doing this should stop the bleeding. ? Warmth. ? Pus or a bad smell.  Keep all follow-up visits as told by your health care provider. This is important. Contact a health care provider if you have:  A headache that does not go away with medicine.  Hives, rash, or itchy skin.  Nausea or vomiting.  Unusual tiredness or weakness.  Signs of infection at your IV site. Get help right away if:  You have a fever or chills.  You urinate less often than usual.  Your urine is darker colored than normal.  You have any of the following: ? Trouble breathing. ? Pain in your back, abdomen, or chest. ? Cool, clammy skin. ? A fast heartbeat. Summary  Platelets are tiny pieces of blood cells that clump together to form a blood clot when you have an injury. If you have too few platelets, your blood may have trouble clotting.  A platelet transfusion is a procedure in which you receive donated platelets through an IV.  A platelet transfusion may be used to stop or prevent excessive bleeding.  After the procedure, check your IV site every day for signs of infection, including redness, swelling, pain, or warmth. This information is not intended to replace advice given to you by your health care provider. Make sure you discuss any questions you have with your health care provider. Document Revised: 12/11/2017 Document Reviewed: 12/11/2017 Elsevier  Patient Education  2021 Elsevier Inc.  Blood Transfusion, Adult, Care After This sheet gives you information about how to care for yourself after your procedure. Your doctor may also give you more specific instructions. If you have problems or questions, contact your doctor. What can I expect after the procedure? After the procedure, it is common to have:  Bruising and soreness at the IV site.  A fever or chills on the day of the procedure. This may be your body's response to the new blood cells received.  A headache. Follow these instructions at home: Insertion site care  Follow instructions from your doctor about how to take care of your insertion site. This is where an IV tube was put into your vein. Make sure you: ? Wash your hands with soap and water before and after you change your bandage (dressing). If you cannot use soap and water, use hand sanitizer. ? Change your bandage as told by your doctor.  Check your insertion site every day for signs of infection. Check for: ? Redness, swelling, or pain. ? Bleeding from the site. ? Warmth. ? Pus or a bad smell.      General instructions  Take over-the-counter and prescription medicines only as told by your doctor.    Rest as told by your doctor.  Go back to your normal activities as told by your doctor.  Keep all follow-up visits as told by your doctor. This is important. Contact a doctor if:  You have itching or red, swollen areas of skin (hives).  You feel worried or nervous (anxious).  You feel weak after doing your normal activities.  You have redness, swelling, warmth, or pain around the insertion site.  You have blood coming from the insertion site, and the blood does not stop with pressure.  You have pus or a bad smell coming from the insertion site. Get help right away if:  You have signs of a serious reaction. This may be coming from an allergy or the body's defense system (immune system). Signs  include: ? Trouble breathing or shortness of breath. ? Swelling of the face or feeling warm (flushed). ? Fever or chills. ? Head, chest, or back pain. ? Dark pee (urine) or blood in the pee. ? Widespread rash. ? Fast heartbeat. ? Feeling dizzy or light-headed. You may receive your blood transfusion in an outpatient setting. If so, you will be told whom to contact to report any reactions. These symptoms may be an emergency. Do not wait to see if the symptoms will go away. Get medical help right away. Call your local emergency services (911 in the [place] Do not drive yourself to the hospital. Summary  Bruising and soreness at the IV site are common.  Check your insertion site every day for signs of infection.  Rest as told by your doctor. Go back to your normal activities as told by your doctor.  Get help right away if you have signs of a serious reaction. This information is not intended to replace advice given to you by your health care provider. Make sure you discuss any questions you have with your health care provider. Document Revised: 04/30/2019 Document Reviewed: 04/30/2019 Elsevier Patient Education  2021 Elsevier Inc.   

## 2021-01-11 LAB — TYPE AND SCREEN
ABO/RH(D): A POS
Antibody Screen: NEGATIVE
Unit division: 0

## 2021-01-11 LAB — BPAM PLATELET PHERESIS
Blood Product Expiration Date: 202202242359
ISSUE DATE / TIME: 202202221224
Unit Type and Rh: 5100

## 2021-01-11 LAB — PREPARE PLATELET PHERESIS: Unit division: 0

## 2021-01-11 LAB — BPAM RBC
Blood Product Expiration Date: 202202252359
ISSUE DATE / TIME: 202202221339
Unit Type and Rh: 600

## 2021-01-13 ENCOUNTER — Inpatient Hospital Stay: Payer: BC Managed Care – PPO

## 2021-01-13 ENCOUNTER — Other Ambulatory Visit: Payer: Self-pay

## 2021-01-13 ENCOUNTER — Telehealth: Payer: Self-pay

## 2021-01-13 VITALS — BP 122/59 | HR 62 | Temp 98.0°F | Resp 17 | Ht 62.0 in | Wt 131.4 lb

## 2021-01-13 DIAGNOSIS — Z95828 Presence of other vascular implants and grafts: Secondary | ICD-10-CM

## 2021-01-13 DIAGNOSIS — Z5111 Encounter for antineoplastic chemotherapy: Secondary | ICD-10-CM | POA: Diagnosis not present

## 2021-01-13 DIAGNOSIS — C250 Malignant neoplasm of head of pancreas: Secondary | ICD-10-CM

## 2021-01-13 DIAGNOSIS — D6489 Other specified anemias: Secondary | ICD-10-CM

## 2021-01-13 DIAGNOSIS — D630 Anemia in neoplastic disease: Secondary | ICD-10-CM

## 2021-01-13 DIAGNOSIS — D696 Thrombocytopenia, unspecified: Secondary | ICD-10-CM

## 2021-01-13 DIAGNOSIS — C259 Malignant neoplasm of pancreas, unspecified: Secondary | ICD-10-CM

## 2021-01-13 LAB — CBC WITH DIFFERENTIAL (CANCER CENTER ONLY)
Abs Immature Granulocytes: 0 10*3/uL (ref 0.00–0.07)
Basophils Absolute: 0 10*3/uL (ref 0.0–0.1)
Basophils Relative: 1 %
Eosinophils Absolute: 0 10*3/uL (ref 0.0–0.5)
Eosinophils Relative: 1 %
HCT: 27 % — ABNORMAL LOW (ref 36.0–46.0)
Hemoglobin: 9 g/dL — ABNORMAL LOW (ref 12.0–15.0)
Immature Granulocytes: 0 %
Lymphocytes Relative: 74 %
Lymphs Abs: 1.1 10*3/uL (ref 0.7–4.0)
MCH: 30.5 pg (ref 26.0–34.0)
MCHC: 33.3 g/dL (ref 30.0–36.0)
MCV: 91.5 fL (ref 80.0–100.0)
Monocytes Absolute: 0.3 10*3/uL (ref 0.1–1.0)
Monocytes Relative: 17 %
Neutro Abs: 0.1 10*3/uL — CL (ref 1.7–7.7)
Neutrophils Relative %: 7 %
Platelet Count: 11 10*3/uL — ABNORMAL LOW (ref 150–400)
RBC: 2.95 MIL/uL — ABNORMAL LOW (ref 3.87–5.11)
RDW: 18.7 % — ABNORMAL HIGH (ref 11.5–15.5)
WBC Count: 1.5 10*3/uL — ABNORMAL LOW (ref 4.0–10.5)
nRBC: 0 % (ref 0.0–0.2)

## 2021-01-13 LAB — SAMPLE TO BLOOD BANK

## 2021-01-13 MED ORDER — PEGFILGRASTIM-BMEZ 6 MG/0.6ML ~~LOC~~ SOSY
6.0000 mg | PREFILLED_SYRINGE | Freq: Once | SUBCUTANEOUS | Status: AC
  Administered 2021-01-13: 6 mg via SUBCUTANEOUS

## 2021-01-13 MED ORDER — SODIUM CHLORIDE 0.9% FLUSH
10.0000 mL | INTRAVENOUS | Status: AC | PRN
  Administered 2021-01-13: 10 mL
  Filled 2021-01-13: qty 10

## 2021-01-13 MED ORDER — ACETAMINOPHEN 325 MG PO TABS
650.0000 mg | ORAL_TABLET | Freq: Once | ORAL | Status: AC
  Administered 2021-01-13: 650 mg via ORAL

## 2021-01-13 MED ORDER — PEGFILGRASTIM-BMEZ 6 MG/0.6ML ~~LOC~~ SOSY
PREFILLED_SYRINGE | SUBCUTANEOUS | Status: AC
  Filled 2021-01-13: qty 0.6

## 2021-01-13 MED ORDER — DIPHENHYDRAMINE HCL 25 MG PO CAPS
ORAL_CAPSULE | ORAL | Status: AC
  Filled 2021-01-13: qty 2

## 2021-01-13 MED ORDER — SODIUM CHLORIDE 0.9% FLUSH
10.0000 mL | INTRAVENOUS | Status: DC | PRN
  Administered 2021-01-13: 10 mL
  Filled 2021-01-13: qty 10

## 2021-01-13 MED ORDER — DIPHENHYDRAMINE HCL 25 MG PO CAPS
25.0000 mg | ORAL_CAPSULE | Freq: Once | ORAL | Status: AC
  Administered 2021-01-13: 25 mg via ORAL

## 2021-01-13 MED ORDER — HEPARIN SOD (PORK) LOCK FLUSH 100 UNIT/ML IV SOLN
500.0000 [IU] | Freq: Every day | INTRAVENOUS | Status: AC | PRN
  Administered 2021-01-13: 500 [IU]
  Filled 2021-01-13: qty 5

## 2021-01-13 MED ORDER — ACETAMINOPHEN 325 MG PO TABS
ORAL_TABLET | ORAL | Status: AC
  Filled 2021-01-13: qty 2

## 2021-01-13 MED ORDER — SODIUM CHLORIDE 0.9% IV SOLUTION
250.0000 mL | Freq: Once | INTRAVENOUS | Status: AC
  Administered 2021-01-13: 250 mL via INTRAVENOUS
  Filled 2021-01-13: qty 250

## 2021-01-13 NOTE — Telephone Encounter (Signed)
Critical Values: Plts 11,000 ANC 0.1 Dr Burr Medico notified Orders received.

## 2021-01-13 NOTE — Patient Instructions (Addendum)
Platelet Transfusion A platelet transfusion is a procedure in which you receive donated platelets through an IV. Platelets are tiny pieces of blood cells. When you get an injury, platelets clump together in the area to form a blood clot. This helps stop bleeding and is the beginning of the healing process. If you have too few platelets, your blood may have trouble clotting. This may cause you to bleed and bruise very easily. You may need a platelet transfusion if you have a condition that causes a low number of platelets (thrombocytopenia). A platelet transfusion may be used to stop or prevent excessive bleeding. Tell a health care provider about:  Any reactions you have had during previous transfusions.  Any allergies you have.  All medicines you are taking, including vitamins, herbs, eye drops, creams, and over-the-counter medicines.  Any blood disorders you have.  Any surgeries you have had.  Any medical conditions you have.  Whether you are pregnant or may be pregnant. What are the risks? Generally, this is a safe procedure. However, problems may occur, including:  Fever.  Infection.  Allergic reaction to the donor platelets.  Your body's disease-fighting system (immune system) attacking the donor platelets (hemolytic reaction). This is rare.  A rare reaction that causes lung damage (transfusion-related acute lung injury). What happens before the procedure? Medicines  Ask your health care provider about: ? Changing or stopping your regular medicines. This is especially important if you are taking diabetes medicines or blood thinners. ? Taking medicines such as aspirin and ibuprofen. These medicines can thin your blood. Do not take these medicines unless your health care provider tells you to take them. ? Taking over-the-counter medicines, vitamins, herbs, and supplements. General instructions  You will have a blood test to determine your blood type. Your blood type  determines what kind of platelets you will be given.  Follow instructions from your health care provider about eating or drinking restrictions.  If you have had an allergic reaction to a transfusion in the past, you may be given medicine to help prevent a reaction.  Your temperature, blood pressure, pulse, and breathing will be monitored. What happens during the procedure?  An IV will be inserted into one of your veins.  For your safety, two health care providers will verify your identity along with the donor platelets about to be infused.  A bag of donor platelets will be connected to your IV. The platelets will flow into your bloodstream. This usually takes 30-60 minutes.  Your temperature, blood pressure, pulse, and breathing will be monitored during the transfusion. This helps detect early signs of any reaction.  You will also be monitored for other symptoms that may indicate a reaction, including chills, hives, or itching.  If you have signs of a reaction at any time, your transfusion will be stopped, and you may be given medicine to help manage the reaction.  When your transfusion is complete, your IV will be removed.  Pressure may be applied to the IV site for a few minutes to stop any bleeding.  The IV site will be covered with a bandage (dressing). The procedure may vary among health care providers and hospitals.   What happens after the procedure?  Your blood pressure, temperature, pulse, and breathing will be monitored until you leave the hospital or clinic.  You may have some bruising and soreness at your IV site. Follow these instructions at home: Medicines  Take over-the-counter and prescription medicines only as told by your health care   provider.  Talk with your health care provider before you take any medicines that contain aspirin or NSAIDs. These medicines increase your risk for dangerous bleeding. General instructions  Change or remove your dressing as told  by your health care provider.  Return to your normal activities as told by your health care provider. Ask your health care provider what activities are safe for you.  Do not take baths, swim, or use a hot tub until your health care provider approves. Ask your health care provider if you may take showers.  Check your IV site every day for signs of infection. Check for: ? Redness, swelling, or pain. ? Fluid or blood. If fluid or blood drains from your IV site, use your hands to press down firmly on a bandage covering the area for a minute or two. Doing this should stop the bleeding. ? Warmth. ? Pus or a bad smell.  Keep all follow-up visits as told by your health care provider. This is important. Contact a health care provider if you have:  A headache that does not go away with medicine.  Hives, rash, or itchy skin.  Nausea or vomiting.  Unusual tiredness or weakness.  Signs of infection at your IV site. Get help right away if:  You have a fever or chills.  You urinate less often than usual.  Your urine is darker colored than normal.  You have any of the following: ? Trouble breathing. ? Pain in your back, abdomen, or chest. ? Cool, clammy skin. ? A fast heartbeat. Summary  Platelets are tiny pieces of blood cells that clump together to form a blood clot when you have an injury. If you have too few platelets, your blood may have trouble clotting.  A platelet transfusion is a procedure in which you receive donated platelets through an IV.  A platelet transfusion may be used to stop or prevent excessive bleeding.  After the procedure, check your IV site every day for signs of infection, including redness, swelling, pain, or warmth. This information is not intended to replace advice given to you by your health care provider. Make sure you discuss any questions you have with your health care provider. Document Revised: 12/11/2017 Document Reviewed: 12/11/2017 Elsevier  Patient Education  2021 [employer].  Pegfilgrastim injection What is this medicine? PEGFILGRASTIM (PEG fil gra stim) is a long-acting granulocyte colony-stimulating factor that stimulates the growth of neutrophils, a type of white blood cell important in the body's fight against infection. It is used to reduce the incidence of fever and infection in patients with certain types of cancer who are receiving chemotherapy that affects the bone marrow, and to increase survival after being exposed to high doses of radiation. This medicine may be used for other purposes; ask your health care provider or pharmacist if you have questions. COMMON BRAND NAME(S): Rexene Edison, Ziextenzo What should I tell my health care provider before I take this medicine? They need to know if you have any of these conditions:  kidney disease  latex allergy  ongoing radiation therapy  sickle cell disease  skin reactions to acrylic adhesives (On-Body Injector only)  an unusual or allergic reaction to pegfilgrastim, filgrastim, other medicines, foods, dyes, or preservatives  pregnant or trying to get pregnant  breast-feeding How should I use this medicine? This medicine is for injection under the skin. If you get this medicine at home, you will be taught how to prepare and give the pre-filled syringe or how  to use the On-body Injector. Refer to the patient Instructions for Use for detailed instructions. Use exactly as directed. Tell your healthcare provider immediately if you suspect that the On-body Injector may not have performed as intended or if you suspect the use of the On-body Injector resulted in a missed or partial dose. It is important that you put your used needles and syringes in a special sharps container. Do not put them in a trash can. If you do not have a sharps container, call your pharmacist or healthcare provider to get one. Talk to your pediatrician regarding the use of  this medicine in children. While this drug may be prescribed for selected conditions, precautions do apply. Overdosage: If you think you have taken too much of this medicine contact a poison control center or emergency room at once. NOTE: This medicine is only for you. Do not share this medicine with others. What if I miss a dose? It is important not to miss your dose. Call your doctor or health care professional if you miss your dose. If you miss a dose due to an On-body Injector failure or leakage, a new dose should be administered as soon as possible using a single prefilled syringe for manual use. What may interact with this medicine? Interactions have not been studied. This list may not describe all possible interactions. Give your health care provider a list of all the medicines, herbs, non-prescription drugs, or dietary supplements you use. Also tell them if you smoke, drink alcohol, or use illegal drugs. Some items may interact with your medicine. What should I watch for while using this medicine? Your condition will be monitored carefully while you are receiving this medicine. You may need blood work done while you are taking this medicine. Talk to your health care provider about your risk of cancer. You may be more at risk for certain types of cancer if you take this medicine. If you are going to need a MRI, CT scan, or other procedure, tell your doctor that you are using this medicine (On-Body Injector only). What side effects may I notice from receiving this medicine? Side effects that you should report to your doctor or health care professional as soon as possible:  allergic reactions (skin rash, itching or hives, swelling of the face, lips, or tongue)  back pain  dizziness  fever  pain, redness, or irritation at site where injected  pinpoint red spots on the skin  red or dark-brown urine  shortness of breath or breathing problems  stomach or side pain, or pain at the  shoulder  swelling  tiredness  trouble passing urine or change in the amount of urine  unusual bruising or bleeding Side effects that usually do not require medical attention (report to your doctor or health care professional if they continue or are bothersome):  bone pain  muscle pain This list may not describe all possible side effects. Call your doctor for medical advice about side effects. You may report side effects to FDA at 1-800-FDA-1088. Where should I keep my medicine? Keep out of the reach of children. If you are using this medicine at home, you will be instructed on how to store it. Throw away any unused medicine after the expiration date on the label. NOTE: This sheet is a summary. It may not cover all possible information. If you have questions about this medicine, talk to your doctor, pharmacist, or health care provider.  2021 Elsevier/Gold Standard (2019-11-27 13:20:51)

## 2021-01-14 LAB — BPAM PLATELET PHERESIS
Blood Product Expiration Date: 202202272359
ISSUE DATE / TIME: 202202251149
Unit Type and Rh: 5100

## 2021-01-14 LAB — PREPARE PLATELET PHERESIS: Unit division: 0

## 2021-01-18 ENCOUNTER — Inpatient Hospital Stay: Payer: BC Managed Care – PPO

## 2021-01-18 ENCOUNTER — Other Ambulatory Visit: Payer: Self-pay | Admitting: Nurse Practitioner

## 2021-01-18 ENCOUNTER — Telehealth: Payer: Self-pay

## 2021-01-18 NOTE — Telephone Encounter (Signed)
Jacqueline Tyler called stating she had labs at [facility] yesterday.  Upon review the Plt count is 30,000.  Reviewed with Dr Burr Medico.  Pt does not need to repeat CBC today.  She will need to be seen next week.  Pt aware and verbalized understanding.

## 2021-01-19 ENCOUNTER — Telehealth: Payer: Self-pay | Admitting: *Deleted

## 2021-01-19 NOTE — Telephone Encounter (Signed)
Patient completed eight cycles FOLFIRINOX; currently no Onclogy treatment noted.   No Ziextenzo prior authorization activity needed for as requested through CoverMyMeds.  Collaborative reports patient also has new insurance not requiring home injections.  Request returned indicating currently no need for injection.

## 2021-01-20 ENCOUNTER — Telehealth: Payer: Self-pay | Admitting: *Deleted

## 2021-01-20 NOTE — Telephone Encounter (Signed)
Jacqueline Tyler [phone]) "requesting  e-mail to send new long term disability form from [place].   Do not have access to a fax number and e-mail may be faster.  [employer] needs this as I transition employment from [employer] to secure a job.  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  Allow up to 14 calendar days for processing begins upon my receipt.

## 2021-01-23 NOTE — Telephone Encounter (Signed)
"  Jacqueline Tyler [phone]); Calling to let you know there isn't anything the doctor  Needs to complete or do concerning what we discussed last week,  LTD it is being changed/completed as a 'clerical error'.  Call me for any questions or concerns."

## 2021-01-25 NOTE — Progress Notes (Signed)
[facility]   [phone] [phone]   Clinic Follow up Note   Patient Care Team: Leola Brazil, DO as PCP - General (Internal Medicine) Radonna Ricker, RN as Oncology Nurse Navigator Malachy Mood, MD as Consulting Physician (Oncology) Fritzi Mandes, MD as Consulting Physician (General Surgery)  Date of Service:  01/27/2021  CHIEF COMPLAINT: F/u of pancreatic cancer  SUMMARY OF ONCOLOGIC HISTORY: Oncology History Overview Note  Cancer Staging Pancreatic cancer [facility]) Staging form: Exocrine Pancreas, AJCC 8th Edition - Clinical stage from 08/24/2020: Stage IIB (cT2, cN1, cM0) - Signed by Malachy Mood, MD on 08/30/2020    Pancreatic cancer (HCC)  08/20/2020 Imaging   CT AP 08/20/20  IMPRESSION: 1. 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 Collateral circulation is noted. There is moderate intrahepatic and extrahepatic biliary dilatation which appears to be due to the pancreatic head mass. Pancreatic ductal dilatation is noted as well. 2. Probable 2.7 cm uterine fibroid. 3. Aortic atherosclerosis.   Aortic Atherosclerosis (ICD10-I70.0).   08/20/2020 Tumor Marker   Ca19-9 - 927   08/22/2020 Procedure   ERCP by Dr Ewing Schlein 08/22/20  IMPRESSION - The major papilla appeared normal. - A biliary sphincterotomy was performed. - Cells for cytology obtained in the lower third and middle of the main duct. - One plastic stent was placed into the common bile duct.   FINAL MICROSCOPIC DIAGNOSIS:  - No malignant cells identified  - Benign reactive/reparative changes   08/24/2020 Cancer Staging   Staging form: Exocrine Pancreas, AJCC 8th Edition - Clinical stage from 08/24/2020: Stage IIB (cT2, cN1, cM0) - Signed by Malachy Mood, MD on 08/30/2020   08/24/2020 Procedure    EUS by Dr Dulce Sellar 08/24/20  IMPRESSION - There was no sign of significant pathology in the ampulla. - A few malignant-appearing lymph nodes were visualized in the peripancreatic region and porta hepatis region. - One stent was visualized endosonographically in the common bile duct. - A mass was identified in the pancreatic head. Tissue was obtained from this exam. The preliminary diagnosis is consistent with adenocarcinoma. Invasion into SMV/PV seen. Lymphadenopathy noted. This was staged T3 N1 Mx by endosonographic criteria. Fine needle aspiration performed.   08/24/2020 Initial Biopsy   FINAL MICROSCOPIC DIAGNOSIS: 08/24/20 - Malignant cells consistent with adenocarcinoma    08/30/2020 Initial Diagnosis   Pancreatic cancer (HCC)   09/05/2020 Imaging   CT Chest  IMPRESSION: 1. Stable 2.7 cm infiltrating pancreatic head mass with borderline enlarged peripancreatic lymph nodes. 2. No findings for pulmonary metastatic disease. 3. Small hiatal hernia. 4. Aortic atherosclerosis.   Aortic Atherosclerosis (ICD10-I70.0).   09/08/2020 Procedure   INSERTION PORT-A-CATH by Dr Freida Busman and Donell Beers    09/12/2020 - 12/29/2020 Chemotherapy   Neoadjuvant FOLFIRINOX q2 weeks starting 09/12/20-12/29/20    12/02/2020 Imaging   CT CAP  IMPRESSION: 1. Previously noted mass of the central pancreatic head is almost entirely resolved, difficult to discretely appreciate on current examination. Findings are consistent with treatment response. There remains obstruction of the pancreatic duct near the head neck junction with mild prominence of the pancreatic duct, measuring up to 5 mm, with atrophy of the distal pancreatic parenchyma. 2. The portal vein, splenic vein, and superior mesenteric vein are now widely patent, previously effaced at the confluence by mass effect. 3.  Interval placement of common bile duct stent, tip positioned in the distal duodenum, with relief of previously seen biliary  ductal dilatation. 4. 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 Following the bifurcation of the celiac mesenteric trunk, the celiac axis appears to directly traverse the vicinity of the mass, although there does appear to be a fat plane about the vessel. 5. The distal small bowel and colon are diffusely somewhat hyperenhancing and inflamed appearing with vascular combing and a tethered appearance of the distal small bowel. This appearance generally suggests inflammatory bowel disease such as Crohn's disease. Correlate with referable clinical history, if present. No evidence of obstruction or other acute complication. 6. Hepatic steatosis. 7. Aortic atherosclerosis.   12/28/2020 Genetic Testing   Negative hereditary cancer genetic testing: no pathogenic variants detected in [employer] Common Hereditary Cancers Panel.  The report date is December 28, 2020.    The Common Hereditary Cancers Panel offered by [facility] includes sequencing and/or deletion duplication testing of the following 47 genes: APC, ATM, AXIN2, BARD1, BMPR1A, BRCA1, BRCA2, BRIP1, CDH1, CDK4, CDKN2A (p14ARF), CDKN2A (p16INK4a), CHEK2, CTNNA1, DICER1, EPCAM (Deletion/duplication testing only), GREM1 (promoter region deletion/duplication testing only), GREM1, HOXB13, KIT, MEN1, MLH1, MSH2, MSH3, MSH6, MUTYH, NBN, NF1, NHTL1, PALB2, PDGFRA, PMS2, POLD1, POLE, PTEN, RAD50, RAD51C, RAD51D, SDHA, SDHB, SDHC, SDHD, SMAD4, SMARCA4. STK11, TP53, TSC1, TSC2, and VHL.  The following genes were evaluated for sequence changes only: SDHA and HOXB13 c.251G>A variant only.   01/17/2021 [facility] IMPRESS[facility] Small lesion in the pancreatic head measuring approximately 1.1 x 0.8 cm without evidence of vascular involvement or metastasis consistent with previously described,  biopsy-proven pancreatic adenocarcinoma.      CURRENT THERAPY:  Neoadjuvant FOLFIRINOX q2 weeks starting 09/12/20-12/29/20  INTERVAL HISTORY:  Jacqueline Tyler is here for a follow up. She presents to the clinic alone. She called her husband to be included in the visit today. 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 She is back walking but has not tried long distances. She notes she is trying to eat but not gaining weight yet. She notes she plans to return to work in [employer] June, but interested in returning sooner if possible.    REVIEW OF SYSTEMS:   Constitutional: Denies fevers, chills or abnormal weight loss Eyes: Denies blurriness of vision Ears, nose, mouth, throat, and face: Denies mucositis or sore throat Respiratory: Denies cough, dyspnea or wheezes Cardiovascular: Denies palpitation, chest discomfort or lower extremity swelling Gastrointestinal:  Denies nausea, heartburn or change in bowel habits Skin: Denies abnormal skin rashes Lymphatics: Denies new lymphadenopathy or easy bruising Neurological: (+) Residual neuropathy in fingertips  Behavioral/Psych: Mood is stable, no new changes  All other systems were reviewed with the patient and are negative.  MEDICAL HISTORY:  Past Medical History:  Diagnosis Date  . Anxiety   . Cancer ([place]) 07/2020   pancreatic cancer  . Depression   . Diabetes mellitus without complication ([place]) 76/[id number]   pancreatic cancer  . High cholesterol   . Hypertension     SURGICAL HISTORY: Past Surgical History:  Procedure Laterality Date  . BILIARY BRUSHING  08/22/2020   Procedure: BILIARY BRUSHING;  Surgeon: Clarene Essex, MD;  Location: [facility];  Service: Endoscopy;;  . BILIARY STENT PLACEMENT N/A 08/22/2020   Procedure: BILIARY  STENT PLACEMENT;  Surgeon: Clarene Essex, MD;  Location: [facility];  Service: Endoscopy;  Laterality: N/A;  . ENDOSCOPIC RETROGRADE CHOLANGIOPANCREATOGRAPHY  (ERCP) WITH PROPOFOL N/A 08/22/2020   Procedure: ENDOSCOPIC RETROGRADE CHOLANGIOPANCREATOGRAPHY (ERCP) WITH PROPOFOL;  Surgeon: Clarene Essex, MD;  Location: [facility];  Service: Endoscopy;  Laterality: N/A;  . ESOPHAGOGASTRODUODENOSCOPY (EGD) WITH PROPOFOL N/A 08/24/2020   Procedure: ESOPHAGOGASTRODUODENOSCOPY (EGD) WITH PROPOFOL;  Surgeon: Arta Silence, MD;  Location: [facility];  Service: Endoscopy;  Laterality: N/A;  . FINE NEEDLE ASPIRATION N/A 08/24/2020   Procedure: FINE NEEDLE ASPIRATION (FNA) LINEAR;  Surgeon: Arta Silence, MD;  Location: [facility];  Service: Endoscopy;  Laterality: N/A;  . PORTACATH PLACEMENT Right 09/08/2020   Procedure: INSERTION PORT-A-CATH;  Surgeon: Dwan Bolt, MD;  Location: [facility];  Service: General;  Laterality: Right;  . SPHINCTEROTOMY  08/22/2020   Procedure: SPHINCTEROTOMY;  Surgeon: Clarene Essex, MD;  Location: [facility];  Service: Endoscopy;;  . UPPER ESOPHAGEAL ENDOSCOPIC ULTRASOUND (EUS) N/A 08/24/2020   Procedure: UPPER ESOPHAGEAL ENDOSCOPIC ULTRASOUND (EUS);  Surgeon: Arta Silence, MD;  Location: [facility];  Service: Endoscopy;  Laterality: N/A;    I have reviewed the social history and family history with the patient and they are unchanged from previous note.  ALLERGIES:  is allergic to oxycodone-acetaminophen, poison ivy extract, and tyloxapol.  MEDICATIONS:  Current Outpatient Medications  Medication Sig Dispense Refill  . acetaminophen (TYLENOL) 325 MG tablet Take 650 mg by mouth every 6 (six) hours as needed.    . citalopram (CELEXA) 40 MG tablet Take 10 mg by mouth daily.     . cloNIDine (CATAPRES) 0.1 MG tablet Take 0.1 mg by mouth at bedtime.    . hydrochlorothiazide (HYDRODIURIL) 12.5 MG tablet Take 12.5 mg by mouth daily.    Marland Kitchen lidocaine-prilocaine (EMLA) cream Apply to affected area once 30 g 3  . magnesium oxide (MAG-OX) 400 (241.3 Mg) MG tablet Take 1 tablet (400 mg total) by mouth 2 (two) times  daily. 60 tablet 0  . omeprazole (PRILOSEC) 20 MG capsule Take 20 mg by mouth daily.    . ondansetron (ZOFRAN) 8 MG tablet Take 1 tablet (8 mg total) by mouth 2 (two) times daily as needed. Start on day 3 after chemotherapy. 30 tablet 1  . pegfilgrastim-bmez (ZIEXTENZO) 6 MG/0.6ML injection Inject 6 mg into the skin every 14 (fourteen) days.    . potassium chloride (KLOR-CON) 10 MEQ tablet TAKE ONE TABLET BY MOUTH THREE TIMES A DAY 90 tablet 0  . prochlorperazine (COMPAZINE) 10 MG tablet Take 1 tablet (10 mg total) by mouth every 6 (six) hours as needed (Nausea or vomiting). 30 tablet 1  . simvastatin (ZOCOR) 80 MG tablet Take 80 mg by mouth daily.     No current facility-administered medications for this visit.    PHYSICAL EXAMINATION: ECOG PERFORMANCE STATUS: 2 - Symptomatic, <50% confined to bed  Vitals:   01/27/21 0834  BP: 124/71  Pulse: 72  Resp: 15  Temp: (!) 97.5 F (36.4 C)  SpO2: 100%   Filed Weights   01/27/21 0834  Weight: 129 lb 1.6 oz (58.6 kg)    GENERAL:alert, no distress and comfortable SKIN: skin color, texture, turgor are normal, no rashes or significant lesions EYES: normal, Conjunctiva are pink and non-injected, sclera clear  NECK: supple, thyroid normal size, non-tender, without nodularity LYMPH:  no palpable lymphadenopathy in the cervical, axillary  LUNGS: clear to auscultation and percussion with normal breathing effort HEART: regular rate & rhythm and  no murmurs and no lower extremity edema ABDOMEN:abdomen soft, non-tender and normal bowel sounds Musculoskeletal:no cyanosis of digits and no clubbing  NEURO: alert & oriented x 3 with fluent speech, no focal motor/sensory deficits  LABORATORY DATA:  I have reviewed the data as listed CBC Latest Ref Rng & Units 01/27/2021 01/13/2021 01/10/2021  WBC 4.0 - 10.5 K/uL 7.3 1.5(L) 1.4(L)  Hemoglobin 12.0 - 15.0 g/dL 10.1(L) 9.0(L) 7.3(L)  Hematocrit 36.0 - 46.0 % 31.9(L) 27.0(L) 22.9(L)  Platelets 150 - 400  K/uL 162 11(L) 5(LL)     CMP Latest Ref Rng & Units 01/27/2021 01/10/2021 01/05/2021  Glucose 70 - 99 mg/dL 136(H) 133(H) 128(H)  BUN 8 - 23 mg/dL $Remove'10 8 13  'OWgFjoO$ Creatinine 0.44 - 1.00 mg/dL 0.72 0.69 0.68  Sodium 135 - 145 mmol/L 139 139 135  Potassium 3.5 - 5.1 mmol/L 3.8 3.7 4.2  Chloride 98 - 111 mmol/L 108 110 107  CO2 22 - 32 mmol/L $RemoveB'23 22 22  'wUteomXe$ Calcium 8.9 - 10.3 mg/dL 8.6(L) 8.3(L) 8.3(L)  Total Protein 6.5 - 8.1 g/dL 6.4(L) 6.0(L) 6.0(L)  Total Bilirubin 0.3 - 1.2 mg/dL 0.5 0.6 1.0  Alkaline Phos 38 - 126 U/L 422(H) 434(H) 519(H)  AST 15 - 41 U/L 61(H) 69(H) 108(H)  ALT 0 - 44 U/L 42 61(H) 88(H)      RADIOGRAPHIC STUDIES: I have personally reviewed the radiological images as listed and agreed with the findings in the report. No results found.   ASSESSMENT & PLAN:  Jacqueline Tyler is a 60 y.o. female with   1. 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 CTdid not show other malignancy. -She was seen bysurgeon Dr Zenia Resides on 09/06/20,neoadjuvantchemo recommended -Due to the borderline resectable disease, Istarted her onneoadjuvant chemotherapy withFOLFIRINOX q2weekson 09/12/20-2/10/22to downstage her borderline resectable disease.  -She has completed 4 months neoadjuvant chemotherapy as planned, tolerated very well, developed severe for last 2 cycles, required dose reduction -We discussed her CT CAP from 01/17/21 which showed smaller lesion in the pancreatic head measuring approximately 1.1 x 0.8 cm without evidence of vascular involvement or metastasis. Overall indicated good response to neoadjuvant treatment.  -She will proceed with Whipple surgery on 02/08/21 with Dr Crisoforo Oxford. I  discussed if she has positive margins or significant residual disease on surgical path, she may benefit from adjvant radiation or more chemotherapy.  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 Her energy has much improved. Labs reviewed, Hg 10.1, platelet and ANC normalized. I encouraged her to work on walking more and longer and regaining strength and increasing her weight. -I discussed the risk of cancer recurrence in the future. I discussed the surveillance plan, which is a physical exam and lab test (including CBC, CMP and CA 19.9) every 3 months for the first 2 years, then every 6-12 months, and surveillance CT scan every 6-12 month for up to 5 year.  -F/u in 6-7 weeks.    2. Diarrhea, Weight loss, Hypokalemia, Cold Sensitivity/Neuropathy, Secondary to chemo and Oxaliplatin  -Since completed chemo most symptoms resolved. 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  -She has improved energy and back to walking but not far. I recommend she work on walking more and longer.  -She is eating fairly, but weight is trending down. I recommend she increase  her protein and calorie intake and work on gaining weight before surgery.  -She can continue oral Magnesium 1-2 times a day and continue10 mEq TID -Given recent LE edema, her 12/30/20 Doppler was negative for DVT  3. Obstructive Jaundice, Transaminitis, Hyperbilirubinuria,Secondary to #1  -Her initial 08/20/20 labs showed AST 349, ALT 473, alk phos 903 and tbili 7.3. She only had Jaundice of skin and eyes. Otherwise asymptomatic.  -Plastic Stent placed by ERCP on 08/22/20 with Dr Watt Climes. May need stent exchange in the future. -Jaundice resolved,T bili normalized, AST/ALT and alk phos fluctuate on treatment.Stable.  4. HTN, Hyperglycemia  -On Zocor, Clonidine and HCTZ for HTN.HCTZ held from start of chemo due to hypotension.  -Continue to F/u with PCP  5. Financial assistance and Social support,  Depression -She notes concern about continuing work when she can for money. She works for [employer] from home. Her husband recently retired but may return to work in near future.  -increased celexa to 20 mg daily recently. Improvedand stable.    PLAN: -Proceed with surgery with Dr Crisoforo Oxford on 02/08/21 at [facility] and F/u in 6-7 weeks to review her surgical path   No problem-specific Assessment & Plan notes found for this encounter.   No orders of the defined types were placed in this encounter.  All questions were answered. The patient knows to call the clinic with any problems, questions or concerns. No barriers to learning was detected. The total time spent in the appointment was 30 minutes.     Truitt Merle, MD 01/27/2021   I, Joslyn Devon, am acting as scribe for Truitt Merle, MD.   I have reviewed the above documentation for accuracy and completeness, and I agree with the above.

## 2021-01-27 ENCOUNTER — Other Ambulatory Visit: Payer: Self-pay

## 2021-01-27 ENCOUNTER — Encounter: Payer: Self-pay | Admitting: Hematology

## 2021-01-27 ENCOUNTER — Inpatient Hospital Stay (HOSPITAL_BASED_OUTPATIENT_CLINIC_OR_DEPARTMENT_OTHER): Payer: BC Managed Care – PPO | Admitting: Hematology

## 2021-01-27 ENCOUNTER — Inpatient Hospital Stay: Payer: BC Managed Care – PPO

## 2021-01-27 ENCOUNTER — Inpatient Hospital Stay: Payer: BC Managed Care – PPO | Attending: Hematology

## 2021-01-27 VITALS — BP 124/71 | HR 72 | Temp 97.5°F | Resp 15 | Ht 62.0 in | Wt 129.1 lb

## 2021-01-27 DIAGNOSIS — Z79899 Other long term (current) drug therapy: Secondary | ICD-10-CM | POA: Insufficient documentation

## 2021-01-27 DIAGNOSIS — F32A Depression, unspecified: Secondary | ICD-10-CM | POA: Insufficient documentation

## 2021-01-27 DIAGNOSIS — E876 Hypokalemia: Secondary | ICD-10-CM | POA: Insufficient documentation

## 2021-01-27 DIAGNOSIS — C259 Malignant neoplasm of pancreas, unspecified: Secondary | ICD-10-CM

## 2021-01-27 DIAGNOSIS — Z9221 Personal history of antineoplastic chemotherapy: Secondary | ICD-10-CM | POA: Insufficient documentation

## 2021-01-27 DIAGNOSIS — I10 Essential (primary) hypertension: Secondary | ICD-10-CM | POA: Insufficient documentation

## 2021-01-27 DIAGNOSIS — G620 Drug-induced polyneuropathy: Secondary | ICD-10-CM | POA: Insufficient documentation

## 2021-01-27 DIAGNOSIS — Z95828 Presence of other vascular implants and grafts: Secondary | ICD-10-CM

## 2021-01-27 DIAGNOSIS — C250 Malignant neoplasm of head of pancreas: Secondary | ICD-10-CM | POA: Diagnosis present

## 2021-01-27 DIAGNOSIS — T451X5A Adverse effect of antineoplastic and immunosuppressive drugs, initial encounter: Secondary | ICD-10-CM | POA: Diagnosis not present

## 2021-01-27 DIAGNOSIS — D630 Anemia in neoplastic disease: Secondary | ICD-10-CM

## 2021-01-27 LAB — CMP (CANCER CENTER ONLY)
ALT: 42 U/L (ref 0–44)
AST: 61 U/L — ABNORMAL HIGH (ref 15–41)
Albumin: 3.3 g/dL — ABNORMAL LOW (ref 3.5–5.0)
Alkaline Phosphatase: 422 U/L — ABNORMAL HIGH (ref 38–126)
Anion gap: 8 (ref 5–15)
BUN: 10 mg/dL (ref 8–23)
CO2: 23 mmol/L (ref 22–32)
Calcium: 8.6 mg/dL — ABNORMAL LOW (ref 8.9–10.3)
Chloride: 108 mmol/L (ref 98–111)
Creatinine: 0.72 mg/dL (ref 0.44–1.00)
GFR, Estimated: >60 mL/min (ref >60)
Glucose, Bld: 136 mg/dL — ABNORMAL HIGH (ref 70–99)
Potassium: 3.8 mmol/L (ref 3.5–5.1)
Sodium: 139 mmol/L (ref 135–145)
Total Bilirubin: 0.5 mg/dL (ref 0.3–1.2)
Total Protein: 6.4 g/dL — ABNORMAL LOW (ref 6.5–8.1)

## 2021-01-27 LAB — CBC WITH DIFFERENTIAL (CANCER CENTER ONLY)
Abs Immature Granulocytes: 0.09 10*3/uL — ABNORMAL HIGH (ref 0.00–0.07)
Basophils Absolute: 0 10*3/uL (ref 0.0–0.1)
Basophils Relative: 0 %
Eosinophils Absolute: 0 10*3/uL (ref 0.0–0.5)
Eosinophils Relative: 0 %
HCT: 31.9 % — ABNORMAL LOW (ref 36.0–46.0)
Hemoglobin: 10.1 g/dL — ABNORMAL LOW (ref 12.0–15.0)
Immature Granulocytes: 1 %
Lymphocytes Relative: 21 %
Lymphs Abs: 1.5 10*3/uL (ref 0.7–4.0)
MCH: 31.1 pg (ref 26.0–34.0)
MCHC: 31.7 g/dL (ref 30.0–36.0)
MCV: 98.2 fL (ref 80.0–100.0)
Monocytes Absolute: 0.6 10*3/uL (ref 0.1–1.0)
Monocytes Relative: 9 %
Neutro Abs: 5 10*3/uL (ref 1.7–7.7)
Neutrophils Relative %: 69 %
Platelet Count: 162 10*3/uL (ref 150–400)
RBC: 3.25 MIL/uL — ABNORMAL LOW (ref 3.87–5.11)
RDW: 20.1 % — ABNORMAL HIGH (ref 11.5–15.5)
WBC Count: 7.3 10*3/uL (ref 4.0–10.5)
nRBC: 0 % (ref 0.0–0.2)

## 2021-01-27 LAB — SAMPLE TO BLOOD BANK

## 2021-01-27 MED ORDER — SODIUM CHLORIDE 0.9% FLUSH
10.0000 mL | INTRAVENOUS | Status: DC | PRN
  Administered 2021-01-27: 10 mL
  Filled 2021-01-27: qty 10

## 2021-01-27 MED ORDER — HEPARIN SOD (PORK) LOCK FLUSH 100 UNIT/ML IV SOLN
500.0000 [IU] | Freq: Once | INTRAVENOUS | Status: AC | PRN
  Administered 2021-01-27: 500 [IU]
  Filled 2021-01-27: qty 5

## 2021-01-27 NOTE — Patient Instructions (Signed)
Implanted Port Insertion, Care After This sheet gives you information about how to care for yourself after your procedure. Your health care provider may also give you more specific instructions. If you have problems or questions, contact your health care provider. What can I expect after the procedure? After the procedure, it is common to have:  Discomfort at the port insertion site.  Bruising on the skin over the port. This should improve over 3-4 days. Follow these instructions at home: Port care  After your port is placed, you will get a manufacturer's information card. The card has information about your port. Keep this card with you at all times.  Take care of the port as told by your health care provider. Ask your health care provider if you or a family member can get training for taking care of the port at home. A home health care nurse may also take care of the port.  Make sure to remember what type of port you have. Incision care  Follow instructions from your health care provider about how to take care of your port insertion site. Make sure you: ? Wash your hands with soap and water before and after you change your bandage (dressing). If soap and water are not available, use hand sanitizer. ? Change your dressing as told by your health care provider. ? Leave stitches (sutures), skin glue, or adhesive strips in place. These skin closures may need to stay in place for 2 weeks or longer. If adhesive strip edges start to loosen and curl up, you may trim the loose edges. Do not remove adhesive strips completely unless your health care provider tells you to do that.  Check your port insertion site every day for signs of infection. Check for: ? Redness, swelling, or pain. ? Fluid or blood. ? Warmth. ? Pus or a bad smell.      Activity  Return to your normal activities as told by your health care provider. Ask your health care provider what activities are safe for you.  Do not  lift anything that is heavier than 10 lb (4.5 kg), or the limit that you are told, until your health care provider says that it is safe. General instructions  Take over-the-counter and prescription medicines only as told by your health care provider.  Do not take baths, swim, or use a hot tub until your health care provider approves. Ask your health care provider if you may take showers. You may only be allowed to take sponge baths.  Do not drive for 24 hours if you were given a sedative during your procedure.  Wear a medical alert bracelet in case of an emergency. This will tell any health care providers that you have a port.  Keep all follow-up visits as told by your health care provider. This is important. Contact a health care provider if:  You cannot flush your port with saline as directed, or you cannot draw blood from the port.  You have a fever or chills.  You have redness, swelling, or pain around your port insertion site.  You have fluid or blood coming from your port insertion site.  Your port insertion site feels warm to the touch.  You have pus or a bad smell coming from the port insertion site. Get help right away if:  You have chest pain or shortness of breath.  You have bleeding from your port that you cannot control. Summary  Take care of the port as told by your   health care provider. Keep the manufacturer's information card with you at all times.  Change your dressing as told by your health care provider.  Contact a health care provider if you have a fever or chills or if you have redness, swelling, or pain around your port insertion site.  Keep all follow-up visits as told by your health care provider. This information is not intended to replace advice given to you by your health care provider. Make sure you discuss any questions you have with your health care provider. Document Revised: 06/03/2018 Document Reviewed: 06/03/2018 Elsevier Patient Education   2021 Elsevier Inc.  

## 2021-01-28 LAB — CANCER ANTIGEN 19-9: CA 19-9: 12 U/mL (ref 0–35)

## 2021-01-30 ENCOUNTER — Telehealth: Payer: Self-pay | Admitting: Hematology

## 2021-01-30 NOTE — Telephone Encounter (Signed)
Scheduled follow-up appointment per 3/11 los. Patient is aware.

## 2021-03-08 NOTE — Progress Notes (Signed)
[name]   Telephone:([phone]) [phone] Fax:(336) [phone]   Clinic Follow up Note   Patient Care Team: Nicola Girt, DO as PCP - General (Internal Medicine) Jonnie Finner, RN as Oncology Nurse Navigator Truitt Merle, MD as Consulting Physician (Oncology) Dwan Bolt, MD as Consulting Physician (General Surgery)  Date of Service:  03/10/2021  CHIEF COMPLAINT: F/u of pancreatic cancer  SUMMARY OF ONCOLOGIC HISTORY: Oncology History Overview Note  Cancer Staging Pancreatic cancer [facility]) Staging form: Exocrine Pancreas, AJCC 8th Edition - Clinical stage from 08/24/2020: Stage IIB (cT2, cN1, cM0) - Signed by Truitt Merle, MD on 08/30/2020    Pancreatic cancer ([place])  08/20/2020 Imaging   CT AP 08/20/20  IMPRESSION: 1. 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 Collateral circulation is noted. There is moderate intrahepatic and extrahepatic biliary dilatation which appears to be due to the pancreatic head mass. Pancreatic ductal dilatation is noted as well. 2. Probable 2.7 cm uterine fibroid. 3. Aortic atherosclerosis.   Aortic Atherosclerosis (ICD10-I70.0).   08/20/2020 Tumor Marker   Ca19-9 - 927   08/22/2020 Procedure   ERCP by Dr Watt Climes 08/22/20  IMPRESSION - The major papilla appeared normal. - A biliary sphincterotomy was performed. - Cells for cytology obtained in the lower third and middle of the main duct. - One plastic stent was placed into the common bile duct.   FINAL MICROSCOPIC DIAGNOSIS:  - No malignant cells identified  - Benign reactive/reparative changes   08/24/2020 Cancer Staging   Staging form: Exocrine Pancreas, AJCC 8th Edition - Clinical stage from 08/24/2020: Stage IIB (cT2, cN1, cM0) - Signed by Truitt Merle, MD on 08/30/2020   08/24/2020 Procedure    EUS by Dr Paulita Fujita 08/24/20  IMPRESSION - There was no sign of significant pathology in the ampulla. - A few malignant-appearing lymph nodes were visualized in the peripancreatic region and porta hepatis region. - One stent was visualized endosonographically in the common bile duct. - A mass was identified in the pancreatic head. Tissue was obtained from this exam. The preliminary diagnosis is consistent with adenocarcinoma. Invasion into SMV/PV seen. Lymphadenopathy noted. This was staged T3 N1 Mx by endosonographic criteria. Fine needle aspiration performed.   08/24/2020 Initial Biopsy   FINAL MICROSCOPIC DIAGNOSIS: 08/24/20 - Malignant cells consistent with adenocarcinoma    08/30/2020 Initial Diagnosis   Pancreatic cancer ([place])   09/05/2020 Imaging   CT Chest  IMPRESSION: 1. Stable 2.7 cm infiltrating pancreatic head mass with borderline enlarged peripancreatic lymph nodes. 2. No findings for pulmonary metastatic disease. 3. Small hiatal hernia. 4. Aortic atherosclerosis.   Aortic Atherosclerosis (ICD10-I70.0).   09/08/2020 Procedure   INSERTION PORT-A-CATH by Dr Zenia Resides and Barry Dienes    09/12/2020 - 12/29/2020 Chemotherapy   Neoadjuvant FOLFIRINOX q2 weeks starting 09/12/20-12/29/20    12/02/2020 Imaging   CT CAP  IMPRESSION: 1. Previously noted mass of the central pancreatic head is almost entirely resolved, difficult to discretely appreciate on current examination. Findings are consistent with treatment response. There remains obstruction of the pancreatic duct near the head neck junction with mild prominence of the pancreatic duct, measuring up to 5 mm, with atrophy of the distal pancreatic parenchyma. 2. The portal vein, splenic vein, and superior mesenteric vein are now widely patent, previously effaced at the confluence by mass effect. 3.  Interval placement of common bile duct stent, tip positioned in the distal duodenum, with relief of previously seen biliary  ductal dilatation. 4. 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 Following the bifurcation of the celiac mesenteric trunk, the celiac axis appears to directly traverse the vicinity of the mass, although there does appear to be a fat plane about the vessel. 5. The distal small bowel and colon are diffusely somewhat hyperenhancing and inflamed appearing with vascular combing and a tethered appearance of the distal small bowel. This appearance generally suggests inflammatory bowel disease such as Crohn's disease. Correlate with referable clinical history, if present. No evidence of obstruction or other acute complication. 6. Hepatic steatosis. 7. Aortic atherosclerosis.   12/28/2020 Genetic Testing   Negative hereditary cancer genetic testing: no pathogenic variants detected in [employer] Common Hereditary Cancers Panel.  The report date is December 28, 2020.    The Common Hereditary Cancers Panel offered by [facility] includes sequencing and/or deletion duplication testing of the following 47 genes: APC, ATM, AXIN2, BARD1, BMPR1A, BRCA1, BRCA2, BRIP1, CDH1, CDK4, CDKN2A (p14ARF), CDKN2A (p16INK4a), CHEK2, CTNNA1, DICER1, EPCAM (Deletion/duplication testing only), GREM1 (promoter region deletion/duplication testing only), GREM1, HOXB13, KIT, MEN1, MLH1, MSH2, MSH3, MSH6, MUTYH, NBN, NF1, NHTL1, PALB2, PDGFRA, PMS2, POLD1, POLE, PTEN, RAD50, RAD51C, RAD51D, SDHA, SDHB, SDHC, SDHD, SMAD4, SMARCA4. STK11, TP53, TSC1, TSC2, and VHL.  The following genes were evaluated for sequence changes only: SDHA and HOXB13 c.251G>A variant only.   01/17/2021 Imaging   CT CAP from [facility] IMPRESSION Small lesion in the pancreatic head measuring approximately 1.1 x 0.8 cm without evidence of vascular involvement or metastasis consistent with previously described,  biopsy-proven pancreatic adenocarcinoma.   02/08/2021 Surgery   Whipple Surgery with Dr Jyl Heinz  Final Pathologic Diagnosis      A.  GALLBLADDER, CHOLECYSTECTOMY: Chronic cholecystitis. No malignancy identified.   B.  BILE DUCT STENT, REMOVAL (GROSS ONLY DIAGNOSIS): Stent, see gross description.   C.  WHIPPLE RESECTION: No residual malignancy identified. Chronic and acute inflammation with granulation tissue reaction, fibrosis and features of chronic pancreatitis, suggestive of therapy effect. Fourteen lymph nodes, negative for metastasis (0/14). Margins negative for malignancy.        Port-A-Cath in place     CURRENT THERAPY:  Surveillance   INTERVAL HISTORY:  Jacqueline Tyler is here for a follow up s/p whipple surgery. She was last seen by me on 01/27/21. She presents to the clinic with her husband. She notes she is recovering well from surgery. She notes everything that she eats goes through her with loose stool. She will have BM 3-5 times a day. She is waiting to receive Volcher to get her Creon covered. She has lost more weight from the amount of diarrhea. I reviewed her medication list with her.   She plans to f/u with Dr Crisoforo Oxford in May. She wonders when she can return to work. She notes her work is mostly working on [employer]. She plans to f/u with Dr Crisoforo Oxford about this.    REVIEW OF SYSTEMS:   Constitutional: Denies fevers, chills or abnormal weight loss Eyes: Denies blurriness of vision Ears, nose, mouth, throat, and face: Denies mucositis or sore throat Respiratory: Denies cough, dyspnea or wheezes Cardiovascular: Denies palpitation, chest discomfort or lower extremity swelling Gastrointestinal:  Denies nausea, heartburn (+) Diarrhea  Skin: Denies abnormal skin rashes Lymphatics:  Denies new lymphadenopathy or easy bruising Neurological:Denies numbness, tingling or new weaknesses Behavioral/Psych: Mood is stable, no new changes  All other systems were reviewed with the  patient and are negative.  MEDICAL HISTORY:  Past Medical History:  Diagnosis Date  . Anxiety   . Cancer (HCC) 07/2020   pancreatic cancer  . Depression   . Diabetes mellitus without complication (HCC) 07/2020   pancreatic cancer  . High cholesterol   . Hypertension     SURGICAL HISTORY: Past Surgical History:  Procedure Laterality Date  . BILIARY BRUSHING  08/22/2020   Procedure: BILIARY BRUSHING;  Surgeon: Vida Rigger, MD;  Location: [facility];  Service: Endoscopy;;  . BILIARY STENT PLACEMENT N/A 08/22/2020   Procedure: BILIARY STENT PLACEMENT;  Surgeon: Vida Rigger, MD;  Location: [facility];  Service: Endoscopy;  Laterality: N/A;  . ENDOSCOPIC RETROGRADE CHOLANGIOPANCREATOGRAPHY (ERCP) WITH PROPOFOL N/A 08/22/2020   Procedure: ENDOSCOPIC RETROGRADE CHOLANGIOPANCREATOGRAPHY (ERCP) WITH PROPOFOL;  Surgeon: Vida Rigger, MD;  Location: [facility];  Service: Endoscopy;  Laterality: N/A;  . ESOPHAGOGASTRODUODENOSCOPY (EGD) WITH PROPOFOL N/A 08/24/2020   Procedure: ESOPHAGOGASTRODUODENOSCOPY (EGD) WITH PROPOFOL;  Surgeon: Willis Modena, MD;  Location: [facility];  Service: Endoscopy;  Laterality: N/A;  . FINE NEEDLE ASPIRATION N/A 08/24/2020   Procedure: FINE NEEDLE ASPIRATION (FNA) LINEAR;  Surgeon: Willis Modena, MD;  Location: [facility];  Service: Endoscopy;  Laterality: N/A;  . PORTACATH PLACEMENT Right 09/08/2020   Procedure: INSERTION PORT-A-CATH;  Surgeon: Fritzi Mandes, MD;  Location: [facility];  Service: General;  Laterality: Right;  . SPHINCTEROTOMY  08/22/2020   Procedure: SPHINCTEROTOMY;  Surgeon: Vida Rigger, MD;  Location: [facility];  Service: Endoscopy;;  . UPPER ESOPHAGEAL ENDOSCOPIC ULTRASOUND (EUS) N/A 08/24/2020   Procedure: UPPER ESOPHAGEAL ENDOSCOPIC ULTRASOUND (EUS);  Surgeon: Willis Modena, MD;  Location: [facility];  Service: Endoscopy;  Laterality: N/A;    I have reviewed the social history and family history with the patient  and they are unchanged from previous note.  ALLERGIES:  is allergic to oxycodone-acetaminophen, poison ivy extract, and tyloxapol.  MEDICATIONS:  Current Outpatient Medications  Medication Sig Dispense Refill  . lipase/protease/amylase (CREON) 36000 UNITS CPEP capsule Take 2 capsules (72,000 Units total) by mouth 3 (three) times daily with meals. May also take 1 capsule (36,000 Units total) as needed (with snacks). 240 capsule 2  . acetaminophen (TYLENOL) 325 MG tablet Take 650 mg by mouth every 6 (six) hours as needed.    . citalopram (CELEXA) 40 MG tablet Take 10 mg by mouth daily.     . cloNIDine (CATAPRES) 0.1 MG tablet Take 0.1 mg by mouth at bedtime.    . hydrochlorothiazide (HYDRODIURIL) 12.5 MG tablet Take 12.5 mg by mouth daily.    . magnesium oxide (MAG-OX) 400 (241.3 Mg) MG tablet Take 1 tablet (400 mg total) by mouth 2 (two) times daily. 60 tablet 0  . omeprazole (PRILOSEC) 20 MG capsule Take 20 mg by mouth daily.    . potassium chloride (KLOR-CON) 10 MEQ tablet Take 1 tablet (10 mEq total) by mouth 3 (three) times daily. 90 tablet 1  . simvastatin (ZOCOR) 80 MG tablet Take 80 mg by mouth daily.     No current facility-administered medications for this visit.    PHYSICAL EXAMINATION: ECOG PERFORMANCE STATUS: 2 - Symptomatic, <50% confined to bed  Vitals:   03/10/21 0853  BP: (!) 150/76  Pulse: (!) 55  Resp: 18  Temp: (!) 97.4 F (36.3 C)  SpO2: 100%  Filed Weights   03/10/21 0853  Weight: 121 lb 3.2 oz (55 kg)    GENERAL:alert, no distress and comfortable SKIN: skin color, texture, turgor are normal, no rashes or significant lesions EYES: normal, Conjunctiva are pink and non-injected, sclera clear  NECK: supple, thyroid normal size, non-tender, without nodularity LYMPH:  no palpable lymphadenopathy in the cervical, axillary  LUNGS: clear to auscultation and percussion with normal breathing effort HEART: regular rate & rhythm and no murmurs and no lower  extremity edema ABDOMEN:abdomen soft, non-tender and normal bowel sounds Musculoskeletal:no cyanosis of digits and no clubbing  NEURO: alert & oriented x 3 with fluent speech, no focal motor/sensory deficits  LABORATORY DATA:  I have reviewed the data as listed CBC Latest Ref Rng & Units 03/10/2021 01/27/2021 01/13/2021  WBC 4.0 - 10.5 K/uL 6.2 7.3 1.5(L)  Hemoglobin 12.0 - 15.0 g/dL 11.1(L) 10.1(L) 9.0(L)  Hematocrit 36.0 - 46.0 % 35.5(L) 31.9(L) 27.0(L)  Platelets 150 - 400 K/uL 257 162 11(L)     CMP Latest Ref Rng & Units 03/10/2021 01/27/2021 01/10/2021  Glucose 70 - 99 mg/dL 102(H) 136(H) 133(H)  BUN 8 - 23 mg/dL 5(L) 10 8  Creatinine 0.44 - 1.00 mg/dL 0.69 0.72 0.69  Sodium 135 - 145 mmol/L 140 139 139  Potassium 3.5 - 5.1 mmol/L 3.6 3.8 3.7  Chloride 98 - 111 mmol/L 106 108 110  CO2 22 - 32 mmol/L $RemoveB'25 23 22  'WjrziEms$ Calcium 8.9 - 10.3 mg/dL 8.6(L) 8.6(L) 8.3(L)  Total Protein 6.5 - 8.1 g/dL 6.5 6.4(L) 6.0(L)  Total Bilirubin 0.3 - 1.2 mg/dL 0.5 0.5 0.6  Alkaline Phos 38 - 126 U/L 273(H) 422(H) 434(H)  AST 15 - 41 U/L 64(H) 61(H) 69(H)  ALT 0 - 44 U/L 33 42 61(H)      RADIOGRAPHIC STUDIES: I have personally reviewed the radiological images as listed and agreed with the findings in the report. No results found.   ASSESSMENT & PLAN:  Jacqueline Tyler is a 60 y.o. female with    1. 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 CTdid not show other malignancy. -Plastic Stent placed by ERCP on 08/22/20 with Dr Watt Climes. May need stent exchange in the future. -Due to the borderline resectable disease, she completed neoadjuvant chemotherapy withFOLFIRINOX q2weekson 09/12/20-2/10/22to  downstage her borderline resectable disease. -She proceeded with whipple surgery by Dr Crisoforo Oxford on 02/08/21. Her surgical path showed no residual disease. She overall had complete response. I do not recommend adjuvant chemotherapy. I personally discussed with patient today.  -Labs reviewed, CBC and CMP WNL except Hg 11.1, BG 102, Ca 8.6, Albumin 3.3, AST 64, Alk Phos 273.  -I discussed the risk of cancer recurrence in the future. I discussed the surveillance plan for 5 years, which is a physical exam and lab test (including CBC, CMP and CA 19.9) every 3 months for the first 2 years, then every 6-12 months, and surveillance CT scan every 6 months for the first 2-3 years.  -She will keep port for now and continue flushes every 6-8 weeks. -F/u in 3 months with next CT AP scan.   2. 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  -S/p whipple surgery she has frequent loose stool, right after eating. This has lead to  more weight loss. I discussed this is likely pancreatic insufficiency from surgery. I recommend creon to help. While she is waiting on medication Volcher, I will call in prescription. She is agreeable. She can start with 1 tab at a time, if tolerable.  -I discussed when her diarrhea resolved, she can stop oral potassium. For now continue oral potassium and magnesium.   3. Obstructive Jaundice, Transaminitis, Hyperbilirubinuria,Secondary to #1, S/p  -Plastic Stent placed by ERCP on 08/22/20 with Dr Watt Climes. May need stent exchange in the future. -Jaundice resolved,T bili normalized, AST/ALT and alk phos fluctuate.Stable.  4. HTN, Hyperglycemia  -On Zocor, Clonidine and HCTZ for HTN.HCTZ held from start of chemo due to hypotension.  -Continue to F/u with PCP  5. Financial assistance and Social support, Depression -She notes concern about continuing work when she can for money. She works for  [employer] from home. Her husband recently retired but may return to work in near future.  -increased celexa to 20 mg daily recently. Improvedand stable.   PLAN: -I called in Creon and refilled potassium today  -Port flush in 6 weeks  -f/u in 3 months with lab, flush, CT AP w contrast a few day before   No problem-specific Assessment & Plan notes found for this encounter.   Orders Placed This Encounter  Procedures  . CT Abdomen Pelvis W Contrast    Standing Status:   Future    Standing Expiration Date:   03/10/2022    Order Specific Question:   If indicated for the ordered procedure, I authorize the administration of contrast media per Radiology protocol    Answer:   Yes    Order Specific Question:   Preferred imaging location?    Answer:   [facility]    Order Specific Question:   Release to patient    Answer:   Immediate    Order Specific Question:   Is Oral Contrast requested for this exam?    Answer:   Yes, Per Radiology protocol   All questions were answered. The patient knows to call the clinic with any problems, questions or concerns. No barriers to learning was detected. The total time spent in the appointment was 30 minutes.     Truitt Merle, MD 03/10/2021   I, Joslyn Devon, am acting as scribe for Truitt Merle, MD.   I have reviewed the above documentation for accuracy and completeness, and I agree with the above.

## 2021-03-10 ENCOUNTER — Inpatient Hospital Stay: Payer: BC Managed Care – PPO | Attending: Hematology | Admitting: Hematology

## 2021-03-10 ENCOUNTER — Inpatient Hospital Stay: Payer: BC Managed Care – PPO

## 2021-03-10 ENCOUNTER — Other Ambulatory Visit: Payer: Self-pay

## 2021-03-10 ENCOUNTER — Encounter: Payer: Self-pay | Admitting: Hematology

## 2021-03-10 VITALS — BP 150/76 | HR 55 | Temp 97.4°F | Resp 18 | Ht 62.0 in | Wt 121.2 lb

## 2021-03-10 DIAGNOSIS — E876 Hypokalemia: Secondary | ICD-10-CM | POA: Insufficient documentation

## 2021-03-10 DIAGNOSIS — Z79899 Other long term (current) drug therapy: Secondary | ICD-10-CM | POA: Diagnosis not present

## 2021-03-10 DIAGNOSIS — I10 Essential (primary) hypertension: Secondary | ICD-10-CM | POA: Insufficient documentation

## 2021-03-10 DIAGNOSIS — G620 Drug-induced polyneuropathy: Secondary | ICD-10-CM | POA: Insufficient documentation

## 2021-03-10 DIAGNOSIS — D630 Anemia in neoplastic disease: Secondary | ICD-10-CM

## 2021-03-10 DIAGNOSIS — C250 Malignant neoplasm of head of pancreas: Secondary | ICD-10-CM | POA: Diagnosis present

## 2021-03-10 DIAGNOSIS — T451X5D Adverse effect of antineoplastic and immunosuppressive drugs, subsequent encounter: Secondary | ICD-10-CM | POA: Diagnosis not present

## 2021-03-10 DIAGNOSIS — F32A Depression, unspecified: Secondary | ICD-10-CM | POA: Insufficient documentation

## 2021-03-10 DIAGNOSIS — C259 Malignant neoplasm of pancreas, unspecified: Secondary | ICD-10-CM

## 2021-03-10 DIAGNOSIS — E1165 Type 2 diabetes mellitus with hyperglycemia: Secondary | ICD-10-CM | POA: Insufficient documentation

## 2021-03-10 DIAGNOSIS — Z9221 Personal history of antineoplastic chemotherapy: Secondary | ICD-10-CM | POA: Diagnosis not present

## 2021-03-10 DIAGNOSIS — R197 Diarrhea, unspecified: Secondary | ICD-10-CM | POA: Diagnosis not present

## 2021-03-10 DIAGNOSIS — Z9049 Acquired absence of other specified parts of digestive tract: Secondary | ICD-10-CM | POA: Insufficient documentation

## 2021-03-10 DIAGNOSIS — E1140 Type 2 diabetes mellitus with diabetic neuropathy, unspecified: Secondary | ICD-10-CM | POA: Diagnosis not present

## 2021-03-10 DIAGNOSIS — Z95828 Presence of other vascular implants and grafts: Secondary | ICD-10-CM

## 2021-03-10 LAB — CBC WITH DIFFERENTIAL (CANCER CENTER ONLY)
Abs Immature Granulocytes: 0.02 10*3/uL (ref 0.00–0.07)
Basophils Absolute: 0 10*3/uL (ref 0.0–0.1)
Basophils Relative: 1 %
Eosinophils Absolute: 0.1 10*3/uL (ref 0.0–0.5)
Eosinophils Relative: 2 %
HCT: 35.5 % — ABNORMAL LOW (ref 36.0–46.0)
Hemoglobin: 11.1 g/dL — ABNORMAL LOW (ref 12.0–15.0)
Immature Granulocytes: 0 %
Lymphocytes Relative: 27 %
Lymphs Abs: 1.7 10*3/uL (ref 0.7–4.0)
MCH: 30.7 pg (ref 26.0–34.0)
MCHC: 31.3 g/dL (ref 30.0–36.0)
MCV: 98.3 fL (ref 80.0–100.0)
Monocytes Absolute: 0.6 10*3/uL (ref 0.1–1.0)
Monocytes Relative: 10 %
Neutro Abs: 3.7 10*3/uL (ref 1.7–7.7)
Neutrophils Relative %: 60 %
Platelet Count: 257 10*3/uL (ref 150–400)
RBC: 3.61 MIL/uL — ABNORMAL LOW (ref 3.87–5.11)
RDW: 13.9 % (ref 11.5–15.5)
WBC Count: 6.2 10*3/uL (ref 4.0–10.5)
nRBC: 0 % (ref 0.0–0.2)

## 2021-03-10 LAB — CMP (CANCER CENTER ONLY)
ALT: 33 U/L (ref 0–44)
AST: 64 U/L — ABNORMAL HIGH (ref 15–41)
Albumin: 3.3 g/dL — ABNORMAL LOW (ref 3.5–5.0)
Alkaline Phosphatase: 273 U/L — ABNORMAL HIGH (ref 38–126)
Anion gap: 9 (ref 5–15)
BUN: 5 mg/dL — ABNORMAL LOW (ref 8–23)
CO2: 25 mmol/L (ref 22–32)
Calcium: 8.6 mg/dL — ABNORMAL LOW (ref 8.9–10.3)
Chloride: 106 mmol/L (ref 98–111)
Creatinine: 0.69 mg/dL (ref 0.44–1.00)
GFR, Estimated: >60 mL/min (ref >60)
Glucose, Bld: 102 mg/dL — ABNORMAL HIGH (ref 70–99)
Potassium: 3.6 mmol/L (ref 3.5–5.1)
Sodium: 140 mmol/L (ref 135–145)
Total Bilirubin: 0.5 mg/dL (ref 0.3–1.2)
Total Protein: 6.5 g/dL (ref 6.5–8.1)

## 2021-03-10 LAB — SAMPLE TO BLOOD BANK

## 2021-03-10 MED ORDER — SODIUM CHLORIDE 0.9% FLUSH
10.0000 mL | INTRAVENOUS | Status: DC | PRN
  Administered 2021-03-10: 10 mL
  Filled 2021-03-10: qty 10

## 2021-03-10 MED ORDER — PANCRELIPASE (LIP-PROT-AMYL) 36000-114000 UNITS PO CPEP: ORAL_CAPSULE | ORAL | 2 refills | Status: DC

## 2021-03-10 MED ORDER — HEPARIN SOD (PORK) LOCK FLUSH 100 UNIT/ML IV SOLN
500.0000 [IU] | Freq: Once | INTRAVENOUS | Status: AC | PRN
  Administered 2021-03-10: 500 [IU]
  Filled 2021-03-10: qty 5

## 2021-03-10 MED ORDER — POTASSIUM CHLORIDE ER 10 MEQ PO TBCR: 10.0000 meq | EXTENDED_RELEASE_TABLET | Freq: Three times a day (TID) | ORAL | 1 refills | Status: DC

## 2021-03-10 NOTE — Patient Instructions (Signed)

## 2021-03-11 LAB — CANCER ANTIGEN 19-9: CA 19-9: 7 U/mL (ref 0–35)

## 2021-04-21 ENCOUNTER — Other Ambulatory Visit: Payer: Self-pay

## 2021-04-21 ENCOUNTER — Inpatient Hospital Stay: Payer: BC Managed Care – PPO | Attending: Hematology

## 2021-04-21 DIAGNOSIS — C250 Malignant neoplasm of head of pancreas: Secondary | ICD-10-CM | POA: Insufficient documentation

## 2021-04-21 DIAGNOSIS — Z452 Encounter for adjustment and management of vascular access device: Secondary | ICD-10-CM | POA: Insufficient documentation

## 2021-04-21 DIAGNOSIS — Z95828 Presence of other vascular implants and grafts: Secondary | ICD-10-CM

## 2021-04-21 MED ORDER — HEPARIN SOD (PORK) LOCK FLUSH 100 UNIT/ML IV SOLN
500.0000 [IU] | Freq: Once | INTRAVENOUS | Status: AC | PRN
  Administered 2021-04-21: 500 [IU]
  Filled 2021-04-21: qty 5

## 2021-04-21 MED ORDER — SODIUM CHLORIDE 0.9% FLUSH
10.0000 mL | INTRAVENOUS | Status: DC | PRN
  Administered 2021-04-21: 10 mL
  Filled 2021-04-21: qty 10

## 2021-04-21 NOTE — Patient Instructions (Signed)
Implanted Port Insertion, Care After This sheet gives you information about how to care for yourself after your procedure. Your health care provider may also give you more specific instructions. If you have problems or questions, contact your health care provider. What can I expect after the procedure? After the procedure, it is common to have:  Discomfort at the port insertion site.  Bruising on the skin over the port. This should improve over 3-4 days. Follow these instructions at home: Port care  After your port is placed, you will get a manufacturer's information card. The card has information about your port. Keep this card with you at all times.  Take care of the port as told by your health care provider. Ask your health care provider if you or a family member can get training for taking care of the port at home. A home health care nurse may also take care of the port.  Make sure to remember what type of port you have. Incision care  Follow instructions from your health care provider about how to take care of your port insertion site. Make sure you: ? Wash your hands with soap and water before and after you change your bandage (dressing). If soap and water are not available, use hand sanitizer. ? Change your dressing as told by your health care provider. ? Leave stitches (sutures), skin glue, or adhesive strips in place. These skin closures may need to stay in place for 2 weeks or longer. If adhesive strip edges start to loosen and curl up, you may trim the loose edges. Do not remove adhesive strips completely unless your health care provider tells you to do that.  Check your port insertion site every day for signs of infection. Check for: ? Redness, swelling, or pain. ? Fluid or blood. ? Warmth. ? Pus or a bad smell.      Activity  Return to your normal activities as told by your health care provider. Ask your health care provider what activities are safe for you.  Do not  lift anything that is heavier than 10 lb (4.5 kg), or the limit that you are told, until your health care provider says that it is safe. General instructions  Take over-the-counter and prescription medicines only as told by your health care provider.  Do not take baths, swim, or use a hot tub until your health care provider approves. Ask your health care provider if you may take showers. You may only be allowed to take sponge baths.  Do not drive for 24 hours if you were given a sedative during your procedure.  Wear a medical alert bracelet in case of an emergency. This will tell any health care providers that you have a port.  Keep all follow-up visits as told by your health care provider. This is important. Contact a health care provider if:  You cannot flush your port with saline as directed, or you cannot draw blood from the port.  You have a fever or chills.  You have redness, swelling, or pain around your port insertion site.  You have fluid or blood coming from your port insertion site.  Your port insertion site feels warm to the touch.  You have pus or a bad smell coming from the port insertion site. Get help right away if:  You have chest pain or shortness of breath.  You have bleeding from your port that you cannot control. Summary  Take care of the port as told by your   health care provider. Keep the manufacturer's information card with you at all times.  Change your dressing as told by your health care provider.  Contact a health care provider if you have a fever or chills or if you have redness, swelling, or pain around your port insertion site.  Keep all follow-up visits as told by your health care provider. This information is not intended to replace advice given to you by your health care provider. Make sure you discuss any questions you have with your health care provider. Document Revised: 06/03/2018 Document Reviewed: 06/03/2018 Elsevier Patient Education   2021 Elsevier Inc.  

## 2021-06-07 ENCOUNTER — Inpatient Hospital Stay: Payer: BC Managed Care – PPO | Attending: Hematology

## 2021-06-07 ENCOUNTER — Ambulatory Visit (HOSPITAL_COMMUNITY)
Admission: RE | Admit: 2021-06-07 | Discharge: 2021-06-07 | Disposition: A | Discharge status: Home / Self Care | Payer: BC Managed Care – PPO | Source: Ambulatory Visit | Attending: Hematology | Admitting: Hematology

## 2021-06-07 ENCOUNTER — Other Ambulatory Visit: Payer: Self-pay

## 2021-06-07 DIAGNOSIS — C250 Malignant neoplasm of head of pancreas: Secondary | ICD-10-CM | POA: Diagnosis present

## 2021-06-07 DIAGNOSIS — D630 Anemia in neoplastic disease: Secondary | ICD-10-CM

## 2021-06-07 DIAGNOSIS — C259 Malignant neoplasm of pancreas, unspecified: Secondary | ICD-10-CM

## 2021-06-07 DIAGNOSIS — I10 Essential (primary) hypertension: Secondary | ICD-10-CM | POA: Insufficient documentation

## 2021-06-07 DIAGNOSIS — Z95828 Presence of other vascular implants and grafts: Secondary | ICD-10-CM

## 2021-06-07 DIAGNOSIS — Z79899 Other long term (current) drug therapy: Secondary | ICD-10-CM | POA: Insufficient documentation

## 2021-06-07 LAB — CMP (CANCER CENTER ONLY)
ALT: 43 U/L (ref 0–44)
AST: 60 U/L — ABNORMAL HIGH (ref 15–41)
Albumin: 4 g/dL (ref 3.5–5.0)
Alkaline Phosphatase: 217 U/L — ABNORMAL HIGH (ref 38–126)
Anion gap: 12 (ref 5–15)
BUN: 12 mg/dL (ref 8–23)
CO2: 27 mmol/L (ref 22–32)
Calcium: 9.4 mg/dL (ref 8.9–10.3)
Chloride: 102 mmol/L (ref 98–111)
Creatinine: 0.74 mg/dL (ref 0.44–1.00)
GFR, Estimated: >60 mL/min (ref >60)
Glucose, Bld: 89 mg/dL (ref 70–99)
Potassium: 4.2 mmol/L (ref 3.5–5.1)
Sodium: 141 mmol/L (ref 135–145)
Total Bilirubin: 1.2 mg/dL (ref 0.3–1.2)
Total Protein: 7.6 g/dL (ref 6.5–8.1)

## 2021-06-07 LAB — CBC WITH DIFFERENTIAL (CANCER CENTER ONLY)
Abs Immature Granulocytes: 0.03 10*3/uL (ref 0.00–0.07)
Basophils Absolute: 0 10*3/uL (ref 0.0–0.1)
Basophils Relative: 1 %
Eosinophils Absolute: 0.2 10*3/uL (ref 0.0–0.5)
Eosinophils Relative: 3 %
HCT: 35.8 % — ABNORMAL LOW (ref 36.0–46.0)
Hemoglobin: 11.4 g/dL — ABNORMAL LOW (ref 12.0–15.0)
Immature Granulocytes: 0 %
Lymphocytes Relative: 32 %
Lymphs Abs: 2.2 10*3/uL (ref 0.7–4.0)
MCH: 27.5 pg (ref 26.0–34.0)
MCHC: 31.8 g/dL (ref 30.0–36.0)
MCV: 86.3 fL (ref 80.0–100.0)
Monocytes Absolute: 0.7 10*3/uL (ref 0.1–1.0)
Monocytes Relative: 10 %
Neutro Abs: 3.8 10*3/uL (ref 1.7–7.7)
Neutrophils Relative %: 54 %
Platelet Count: 282 10*3/uL (ref 150–400)
RBC: 4.15 MIL/uL (ref 3.87–5.11)
RDW: 14.2 % (ref 11.5–15.5)
WBC Count: 7 10*3/uL (ref 4.0–10.5)
nRBC: 0 % (ref 0.0–0.2)

## 2021-06-07 LAB — SAMPLE TO BLOOD BANK

## 2021-06-07 MED ORDER — IOHEXOL 350 MG/ML SOLN
100.0000 mL | Freq: Once | INTRAVENOUS | Status: AC | PRN
  Administered 2021-06-07: 75 mL via INTRAVENOUS

## 2021-06-07 MED ORDER — HEPARIN SOD (PORK) LOCK FLUSH 100 UNIT/ML IV SOLN: 500.0000 [IU] | Freq: Once | INTRAVENOUS | Status: AC

## 2021-06-07 MED ORDER — SODIUM CHLORIDE 0.9% FLUSH
10.0000 mL | INTRAVENOUS | Status: DC | PRN
  Administered 2021-06-07: 10 mL
  Filled 2021-06-07: qty 10

## 2021-06-07 MED ORDER — HEPARIN SOD (PORK) LOCK FLUSH 100 UNIT/ML IV SOLN: 500.0000 [IU] | Freq: Once | INTRAVENOUS | Status: DC

## 2021-06-07 MED ORDER — HEPARIN SOD (PORK) LOCK FLUSH 100 UNIT/ML IV SOLN
INTRAVENOUS | Status: AC
  Administered 2021-06-07: 500 [IU] via INTRAVENOUS
  Filled 2021-06-07: qty 5

## 2021-06-07 NOTE — Patient Instructions (Signed)

## 2021-06-08 LAB — CANCER ANTIGEN 19-9: CA 19-9: 5 U/mL (ref 0–35)

## 2021-06-08 NOTE — Progress Notes (Signed)
Jacqueline Tyler   [phone] [phone]   Clinic Follow up Note   Patient Care Team: Nicola Girt, DO as PCP - General (Internal Medicine) Truitt Merle, MD as Consulting Physician (Oncology) Dwan Bolt, MD as Consulting Physician (General Surgery)  Date of Service:  06/09/2021  CHIEF COMPLAINT: f/u of pancreatic cancer  SUMMARY OF ONCOLOGIC HISTORY: Oncology History Overview Note  Cancer Staging Pancreatic cancer [facility]) Staging form: Exocrine Pancreas, AJCC 8th Edition - Clinical stage from 08/24/2020: Stage IIB (cT2, cN1, cM0) - Signed by Truitt Merle, MD on 08/30/2020 Stage prefix: Initial diagnosis    Pancreatic cancer ([place])  08/20/2020 Imaging   CT AP 08/20/20  IMPRESSION: 1. 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 Collateral circulation is noted. There is moderate intrahepatic and extrahepatic biliary dilatation which appears to be due to the pancreatic head mass. Pancreatic ductal dilatation is noted as well. 2. Probable 2.7 cm uterine fibroid. 3. Aortic atherosclerosis.   Aortic Atherosclerosis (ICD10-I70.0).   08/20/2020 Tumor Marker   Ca19-9 - 927   08/22/2020 Procedure   ERCP by Dr Watt Climes 08/22/20  IMPRESSION - The major papilla appeared normal. - A biliary sphincterotomy was performed. - Cells for cytology obtained in the lower third and middle of the main duct. - One plastic stent was placed into the common bile duct.   FINAL MICROSCOPIC DIAGNOSIS:  - No malignant cells identified  - Benign reactive/reparative changes   08/24/2020 Cancer Staging   Staging form: Exocrine Pancreas, AJCC 8th Edition - Clinical stage from 08/24/2020: Stage IIB (cT2, cN1, cM0) - Signed by Truitt Merle, MD on 08/30/2020    08/24/2020 Procedure   EUS by Dr Paulita Fujita  08/24/20  IMPRESSION - There was no sign of significant pathology in the ampulla. - A few malignant-appearing lymph nodes were visualized in the peripancreatic region and porta hepatis region. - One stent was visualized endosonographically in the common bile duct. - A mass was identified in the pancreatic head. Tissue was obtained from this exam. The preliminary diagnosis is consistent with adenocarcinoma. Invasion into SMV/PV seen. Lymphadenopathy noted. This was staged T3 N1 Mx by endosonographic criteria. Fine needle aspiration performed.   08/24/2020 Initial Biopsy   FINAL MICROSCOPIC DIAGNOSIS: 08/24/20 - Malignant cells consistent with adenocarcinoma    08/30/2020 Initial Diagnosis   Pancreatic cancer ([place])    09/05/2020 Imaging   CT Chest  IMPRESSION: 1. Stable 2.7 cm infiltrating pancreatic head mass with borderline enlarged peripancreatic lymph nodes. 2. No findings for pulmonary metastatic disease. 3. Small hiatal hernia. 4. Aortic atherosclerosis.   Aortic Atherosclerosis (ICD10-I70.0).   09/08/2020 Procedure   INSERTION PORT-A-CATH by Dr Zenia Resides and Barry Dienes    09/12/2020 - 12/29/2020 Chemotherapy   Neoadjuvant FOLFIRINOX q2 weeks starting 09/12/20-12/29/20    12/02/2020 Imaging   CT CAP  IMPRESSION: 1. Previously noted mass of the central pancreatic head is almost entirely resolved, difficult to discretely appreciate on current examination. Findings are consistent with treatment response. There remains obstruction of the pancreatic duct near the head neck junction with mild prominence of the pancreatic duct, measuring up to 5 mm, with atrophy of the distal pancreatic parenchyma. 2. The portal vein, splenic vein, and superior mesenteric vein are now widely patent, previously effaced at the confluence by mass effect. 3. Interval placement  of common bile duct stent, tip positioned in the distal duodenum, with relief of previously seen biliary ductal dilatation. 4.  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 Following the bifurcation of the celiac mesenteric trunk, the celiac axis appears to directly traverse the vicinity of the mass, although there does appear to be a fat plane about the vessel. 5. The distal small bowel and colon are diffusely somewhat hyperenhancing and inflamed appearing with vascular combing and a tethered appearance of the distal small bowel. This appearance generally suggests inflammatory bowel disease such as Crohn's disease. Correlate with referable clinical history, if present. No evidence of obstruction or other acute complication. 6. Hepatic steatosis. 7. Aortic atherosclerosis.   12/28/2020 Genetic Testing   Negative hereditary cancer genetic testing: no pathogenic variants detected in [employer] Common Hereditary Cancers Panel.  The report date is December 28, 2020.    The Common Hereditary Cancers Panel offered by [employer] includes sequencing and/or deletion duplication testing of the following 47 genes: APC, ATM, AXIN2, BARD1, BMPR1A, BRCA1, BRCA2, BRIP1, CDH1, CDK4, CDKN2A (p14ARF), CDKN2A (p16INK4a), CHEK2, CTNNA1, DICER1, EPCAM (Deletion/duplication testing only), GREM1 (promoter region deletion/duplication testing only), GREM1, HOXB13, KIT, MEN1, MLH1, MSH2, MSH3, MSH6, MUTYH, NBN, NF1, NHTL1, PALB2, PDGFRA, PMS2, POLD1, POLE, PTEN, RAD50, RAD51C, RAD51D, SDHA, SDHB, SDHC, SDHD, SMAD4, SMARCA4. STK11, TP53, TSC1, TSC2, and VHL.  The following genes were evaluated for sequence changes only: SDHA and HOXB13 c.251G>A variant only.   01/17/2021 Imaging   CT CAP [facility] IMPRESSION Small lesion in the pancreatic head measuring approximately 1.1 x 0.8 cm without evidence of vascular involvement or metastasis consistent with previously described, biopsy-proven pancreatic  adenocarcinoma.   02/08/2021 Surgery   Whipple Surgery with Dr Jyl Heinz  Final Pathologic Diagnosis      A.  GALLBLADDER, CHOLECYSTECTOMY: Chronic cholecystitis. No malignancy identified.   B.  BILE DUCT STENT, REMOVAL (GROSS ONLY DIAGNOSIS): Stent, see gross description.   C.  WHIPPLE RESECTION: No residual malignancy identified. Chronic and acute inflammation with granulation tissue reaction, fibrosis and features of chronic pancreatitis, suggestive of therapy effect. Fourteen lymph nodes, negative for metastasis (0/14). Margins negative for malignancy.       06/07/2021 Imaging   CT AP  IMPRESSION: 1. No current findings of recurrent malignancy. Interval Whipple procedure with expected postoperative findings. 2. A 3.5 cm stent is present in the dorsal pancreatic duct. No duct dilatation. There is an approximately 1.5 mm lucency centrally along this stent shown on image 43 series 7, possibilities include stent fracture, two separate stents, or an intentional radial lucency in this type of stent. 3. Small type 1 hiatal hernia. Mild distal esophageal wall thickening may reflect low-grade esophagitis. 4.  Aortic Atherosclerosis (ICD10-I70.0). 5.  Prominent stool throughout the colon favors constipation. 6. Uterine fibroid. 7. 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   Port-A-Cath in place     CURRENT THERAPY:  Surveillance   INTERVAL HISTORY:  Jacqueline Tyler is here for a follow up of pancreatic cancer. She was last seen by me on 03/10/21. She presents to the clinic with her husband.  She has recovered well from surgery, has good appetite and energy level, and has returned back to work. She denies any pain, abdominal bloating, nausea, or other GI symptoms.  She has diarrhea occasionally, she is taking Creon 3  times a day, and potassium 3 tablets a day. She is under stress lately due to her son's social situation.  She  contributes to her mild weight loss lately due to that.   All other systems were reviewed with the patient and are negative.  MEDICAL HISTORY:  Past Medical History:  Diagnosis Date   Anxiety    Cancer ([place]) 07/2020   pancreatic cancer   Depression    Diabetes mellitus without complication ([place]) 67/6195   pancreatic cancer   High cholesterol    Hypertension     SURGICAL HISTORY: Past Surgical History:  Procedure Laterality Date   BILIARY BRUSHING  08/22/2020   Procedure: BILIARY BRUSHING;  Surgeon: Clarene Essex, MD;  Location: [facility];  Service: Endoscopy;;   BILIARY STENT PLACEMENT N/A 08/22/2020   Procedure: BILIARY STENT PLACEMENT;  Surgeon: Clarene Essex, MD;  Location: [facility];  Service: Endoscopy;  Laterality: N/A;   ENDOSCOPIC RETROGRADE CHOLANGIOPANCREATOGRAPHY (ERCP) WITH PROPOFOL N/A 08/22/2020   Procedure: ENDOSCOPIC RETROGRADE CHOLANGIOPANCREATOGRAPHY (ERCP) WITH PROPOFOL;  Surgeon: Clarene Essex, MD;  Location: [facility];  Service: Endoscopy;  Laterality: N/A;   ESOPHAGOGASTRODUODENOSCOPY (EGD) WITH PROPOFOL N/A 08/24/2020   Procedure: ESOPHAGOGASTRODUODENOSCOPY (EGD) WITH PROPOFOL;  Surgeon: Arta Silence, MD;  Location: [facility];  Service: Endoscopy;  Laterality: N/A;   FINE NEEDLE ASPIRATION N/A 08/24/2020   Procedure: FINE NEEDLE ASPIRATION (FNA) LINEAR;  Surgeon: Arta Silence, MD;  Location: [facility];  Service: Endoscopy;  Laterality: N/A;   PORTACATH PLACEMENT Right 09/08/2020   Procedure: INSERTION PORT-A-CATH;  Surgeon: Dwan Bolt, MD;  Location: [place];  Service: General;  Laterality: Right;   SPHINCTEROTOMY  08/22/2020   Procedure: SPHINCTEROTOMY;  Surgeon: Clarene Essex, MD;  Location: [facility];  Service: Endoscopy;;   UPPER ESOPHAGEAL ENDOSCOPIC ULTRASOUND (EUS) N/A 08/24/2020   Procedure: UPPER ESOPHAGEAL ENDOSCOPIC ULTRASOUND (EUS);  Surgeon: Arta Silence, MD;  Location: [facility];  Service: Endoscopy;   Laterality: N/A;    I have reviewed the social history and family history with the patient and they are unchanged from previous note.  ALLERGIES:  is allergic to oxycodone-acetaminophen, poison ivy extract, and tyloxapol.  MEDICATIONS:  Current Outpatient Medications  Medication Sig Dispense Refill   acetaminophen (TYLENOL) 325 MG tablet Take 650 mg by mouth every 6 (six) hours as needed.     citalopram (CELEXA) 40 MG tablet Take 10 mg by mouth daily.      cloNIDine (CATAPRES) 0.1 MG tablet Take 0.1 mg by mouth at bedtime.     hydrochlorothiazide (HYDRODIURIL) 12.5 MG tablet Take 12.5 mg by mouth daily.     lipase/protease/amylase (CREON) 36000 UNITS CPEP capsule Take 2 capsules (72,000 Units total) by mouth 3 (three) times daily with meals. May also take 1 capsule (36,000 Units total) as needed (with snacks). 240 capsule 2   magnesium oxide (MAG-OX) 400 (241.3 Mg) MG tablet Take 1 tablet (400 mg total) by mouth 2 (two) times daily. 60 tablet 0   omeprazole (PRILOSEC) 20 MG capsule Take 20 mg by mouth daily.     potassium chloride (KLOR-CON) 10 MEQ tablet Take 1 tablet (10 mEq total) by mouth 3 (three) times daily. 90 tablet 1   simvastatin (ZOCOR) 80 MG tablet Take 80 mg by mouth daily.     No current facility-administered medications for this visit.    PHYSICAL EXAMINATION: ECOG PERFORMANCE STATUS: 0 - Asymptomatic  Vitals:   06/09/21 0754  BP: (!) 170/85  Pulse: 77  Resp: 18  Temp: 98.3  F (36.8 C)  SpO2: 98%   Filed Weights   06/09/21 0754  Weight: 118 lb 12.8 oz (53.9 kg)    GENERAL:alert, no distress and comfortable SKIN: skin color, texture, turgor are normal, no rashes or significant lesions EYES: normal, Conjunctiva are pink and non-injected, sclera clear  NECK: supple, thyroid normal size, non-tender, without nodularity LYMPH:  no palpable lymphadenopathy in the cervical, axillary  LUNGS: clear to auscultation and percussion with normal breathing effort HEART:  regular rate & rhythm and no murmurs and no lower extremity edema ABDOMEN:abdomen soft, non-tender and normal bowel sounds Musculoskeletal:no cyanosis of digits and no clubbing  NEURO: alert & oriented x 3 with fluent speech, no focal motor/sensory deficits  LABORATORY DATA:  I have reviewed the data as listed CBC Latest Ref Rng & Units 06/07/2021 03/10/2021 01/27/2021  WBC 4.0 - 10.5 K/uL 7.0 6.2 7.3  Hemoglobin 12.0 - 15.0 g/dL 11.4(L) 11.1(L) 10.1(L)  Hematocrit 36.0 - 46.0 % 35.8(L) 35.5(L) 31.9(L)  Platelets 150 - 400 K/uL 282 257 162     CMP Latest Ref Rng & Units 06/07/2021 03/10/2021 01/27/2021  Glucose 70 - 99 mg/dL 89 102(H) 136(H)  BUN 8 - 23 mg/dL 12 5(L) 10  Creatinine 0.44 - 1.00 mg/dL 0.74 0.69 0.72  Sodium 135 - 145 mmol/L 141 140 139  Potassium 3.5 - 5.1 mmol/L 4.2 3.6 3.8  Chloride 98 - 111 mmol/L 102 106 108  CO2 22 - 32 mmol/L $RemoveB'27 25 23  'RWutUeRR$ Calcium 8.9 - 10.3 mg/dL 9.4 8.6(L) 8.6(L)  Total Protein 6.5 - 8.1 g/dL 7.6 6.5 6.4(L)  Total Bilirubin 0.3 - 1.2 mg/dL 1.2 0.5 0.5  Alkaline Phos 38 - 126 U/L 217(H) 273(H) 422(H)  AST 15 - 41 U/L 60(H) 64(H) 61(H)  ALT 0 - 44 U/L 43 33 42      RADIOGRAPHIC STUDIES: I have personally reviewed the radiological images as listed and agreed with the findings in the report. CT Abdomen Pelvis W Contrast  Result Date: 06/08/2021 CLINICAL DATA:  Pancreatic cancer, prior chemotherapy, prior Whipple surgery. Restaging assessment. EXAM: CT ABDOMEN AND PELVIS WITH CONTRAST TECHNIQUE: Multidetector CT imaging of the abdomen and pelvis was performed using the standard protocol following bolus administration of intravenous contrast. CONTRAST:  41mL OMNIPAQUE IOHEXOL 350 MG/ML SOLN COMPARISON:  CT abdomen 12/02/2020 FINDINGS: Lower chest: Small type 1 hiatal hernia. Mild distal esophageal wall thickening, esophagitis would be a common cause. Fat density in the inferior-apical-septal wall of the left ventricle, cannot exclude remote myocardial  infarction. 2 mm calcified nodule in the posterior basal segment left lower lobe on image 8 series 7, benign. Hepatobiliary: Cholecystectomy. No biliary dilatation, choledochoenterostomy presumed patent. Otherwise unremarkable. Pancreas: Prior Whipple procedure. 3.5 cm stent in the dorsal pancreatic duct with a 1.5 mm lucency in the central stent on image 43 of series 7. No dorsal pancreatic duct dilatation, pancreaticoenterostomy likely patent. No findings of pancreatitis. No locally recurrent mass identified. Spleen: Unremarkable Adrenals/Urinary Tract: Unremarkable Stomach/Bowel: Partial gastrectomy with patent gastrojejunostomy. Prominent stool throughout the colon favors constipation. There is some swirling of the mesentery but no bowel volvulus identified. Vascular/Lymphatic: Aortoiliac atherosclerotic vascular disease. No pathologic adenopathy. Reproductive: Lobulated hypodensity along the right posterior uterus suspicious for fibroids. Otherwise unremarkable. Other: No complications along the laparotomy site. Low-grade central mesenteric edema likely from mild sclerosing mesenteritis, similar to prior. Musculoskeletal: Unremarkable IMPRESSION: 1. No current findings of recurrent malignancy. Interval Whipple procedure with expected postoperative findings. 2. A 3.5 cm stent is present in the  dorsal pancreatic duct. No duct dilatation. There is an approximately 1.5 mm lucency centrally along this stent shown on image 43 series 7, possibilities include stent fracture, two separate stents, or an intentional radial lucency in this type of stent. 3. Small type 1 hiatal hernia. Mild distal esophageal wall thickening may reflect low-grade esophagitis. 4.  Aortic Atherosclerosis (ICD10-I70.0). 5.  Prominent stool throughout the colon favors constipation. 6. Uterine fibroid. 7. 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 Electronically Signed   By: Van Clines M.D.    On: 06/08/2021 12:25     ASSESSMENT & PLAN:  NIKEIA HENKES is a 60 y.o. female with   1. 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 Given the vascular invasion, this is a borderline resectable. CT did not show other malignancy.   -Plastic Stent placed by ERCP on 08/22/20 with Dr Watt Climes. May need stent exchange in the future.  -Due to the borderline resectable disease, she completed neoadjuvant chemotherapy with FOLFIRINOX q2weeks on 09/12/20-12/29/20 to downstage her borderline resectable disease.  -She proceeded with whipple surgery by Dr Crisoforo Oxford on 02/08/21. Her surgical path showed no residual disease. She overall had complete response. I do not recommend adjuvant chemotherapy.  -Surveillance CT AP 06/07/21 was negative for recurrent malignancy.  I personally reviewed the scan image and discussed the finding with patient, including the incidental findings.  I will discuss with her surgeon Dr. Crisoforo Oxford about the pancreatic stent -Continue cancer surveillance, we discussed high risk of recurrence, she is agreeable to keep the port for a few years. -Next follow-up in 2 months, plan to repeat surveillance CT scan in 6 months   2. HTN -On Zocor, Clonidine and HCTZ for HTN. HCTZ held from start of chemo due to hypotension. -Her blood pressure is slightly high today, I recommend her to restart hydrochlorothiazide now -Continue to F/u with PCP       PLAN: -Lab and scan reviewed, no evidence of cancer recurrence -Lab, port flush and follow-up with NP Lacie in 2 months   No problem-specific Assessment & Plan notes found for this encounter.   No orders of the defined types were placed in this encounter.  All questions were answered. The patient knows to call the  clinic with any problems, questions or concerns. No barriers to learning was detected. The total time spent in the appointment was 30 minutes.     Truitt Merle, MD 06/09/2021   I, Wilburn Mylar, am acting as scribe for Truitt Merle, MD.   I have reviewed the above documentation for accuracy and completeness, and I agree with the above.

## 2021-06-09 ENCOUNTER — Encounter: Payer: Self-pay | Admitting: Hematology

## 2021-06-09 ENCOUNTER — Inpatient Hospital Stay (HOSPITAL_BASED_OUTPATIENT_CLINIC_OR_DEPARTMENT_OTHER): Payer: BC Managed Care – PPO | Admitting: Hematology

## 2021-06-09 ENCOUNTER — Other Ambulatory Visit: Payer: Self-pay

## 2021-06-09 VITALS — BP 170/85 | HR 77 | Temp 98.3°F | Resp 18 | Ht 62.0 in | Wt 118.8 lb

## 2021-06-09 DIAGNOSIS — C250 Malignant neoplasm of head of pancreas: Secondary | ICD-10-CM

## 2021-06-10 ENCOUNTER — Other Ambulatory Visit: Payer: Self-pay | Admitting: Hematology

## 2021-08-08 NOTE — Progress Notes (Signed)
[place]   [phone] Fax:([phone]) [phone]   Clinic Follow up Note   Patient Care Team: Nicola Girt, DO as PCP - General (Internal Medicine) Truitt Merle, MD as Consulting Physician (Oncology) Dwan Bolt, MD as Consulting Physician (General Surgery) 08/09/2021  CHIEF COMPLAINT: Follow-up pancreas cancer  SUMMARY OF ONCOLOGIC HISTORY: Oncology History Overview Note  Cancer Staging Pancreatic cancer [facility]) Staging form: Exocrine Pancreas, AJCC 8th Edition - Clinical stage from 08/24/2020: Stage IIB (cT2, cN1, cM0) - Signed by Truitt Merle, MD on 08/30/2020 Stage prefix: Initial diagnosis    Pancreatic cancer ([place])  08/20/2020 Imaging   CT AP 08/20/20  IMPRESSION: 1. 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 Collateral circulation is noted. There is moderate intrahepatic and extrahepatic biliary dilatation which appears to be due to the pancreatic head mass. Pancreatic ductal dilatation is noted as well. 2. Probable 2.7 cm uterine fibroid. 3. Aortic atherosclerosis.   Aortic Atherosclerosis (ICD10-I70.0).   08/20/2020 Tumor Marker   Ca19-9 - 927   08/22/2020 Procedure   ERCP by Dr Watt Climes 08/22/20  IMPRESSION - The major papilla appeared normal. - A biliary sphincterotomy was performed. - Cells for cytology obtained in the lower third and middle of the main duct. - One plastic stent was placed into the common bile duct.   FINAL MICROSCOPIC DIAGNOSIS:  - No malignant cells identified  - Benign reactive/reparative changes   08/24/2020 Cancer Staging   Staging form: Exocrine Pancreas, AJCC 8th Edition - Clinical stage from 08/24/2020: Stage IIB (cT2, cN1, cM0) - Signed by Truitt Merle, MD on 08/30/2020   08/24/2020 Procedure   EUS by Dr Paulita Fujita 08/24/20  IMPRESSION -  There was no sign of significant pathology in the ampulla. - A few malignant-appearing lymph nodes were visualized in the peripancreatic region and porta hepatis region. - One stent was visualized endosonographically in the common bile duct. - A mass was identified in the pancreatic head. Tissue was obtained from this exam. The preliminary diagnosis is consistent with adenocarcinoma. Invasion into SMV/PV seen. Lymphadenopathy noted. This was staged T3 N1 Mx by endosonographic criteria. Fine needle aspiration performed.   08/24/2020 Initial Biopsy   FINAL MICROSCOPIC DIAGNOSIS: 08/24/20 - Malignant cells consistent with adenocarcinoma    08/30/2020 Initial Diagnosis   Pancreatic cancer ([place])   09/05/2020 Imaging   CT Chest  IMPRESSION: 1. Stable 2.7 cm infiltrating pancreatic head mass with borderline enlarged peripancreatic lymph nodes. 2. No findings for pulmonary metastatic disease. 3. Small hiatal hernia. 4. Aortic atherosclerosis.   Aortic Atherosclerosis (ICD10-I70.0).   09/08/2020 Procedure   INSERTION PORT-A-CATH by Dr Zenia Resides and Barry Dienes    09/12/2020 - 12/29/2020 Chemotherapy   Neoadjuvant FOLFIRINOX q2 weeks starting 09/12/20-12/29/20    12/02/2020 Imaging   CT CAP  IMPRESSION: 1. Previously noted mass of the central pancreatic head is almost entirely resolved, difficult to discretely appreciate on current examination. Findings are consistent with treatment response. There remains obstruction of the pancreatic duct near the head neck junction with mild prominence of the pancreatic duct, measuring up to 5 mm, with atrophy of the distal pancreatic parenchyma. 2. The portal vein, splenic vein, and superior mesenteric vein are now widely patent, previously effaced at the confluence by mass effect. 3. Interval placement of common bile duct stent, tip positioned in  the distal duodenum, with relief of previously seen biliary ductal dilatation. 4. 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 Following the bifurcation of the celiac mesenteric trunk, the celiac axis appears to directly traverse the vicinity of the mass, although there does appear to be a fat plane about the vessel. 5. The distal small bowel and colon are diffusely somewhat hyperenhancing and inflamed appearing with vascular combing and a tethered appearance of the distal small bowel. This appearance generally suggests inflammatory bowel disease such as Crohn's disease. Correlate with referable clinical history, if present. No evidence of obstruction or other acute complication. 6. Hepatic steatosis. 7. Aortic atherosclerosis.   12/28/2020 Genetic Testing   Negative hereditary cancer genetic testing: no pathogenic variants detected in [employer] Common Hereditary Cancers Panel.  The report date is December 28, 2020.    The Common Hereditary Cancers Panel offered by [employer] includes sequencing and/or deletion duplication testing of the following 47 genes: APC, ATM, AXIN2, BARD1, BMPR1A, BRCA1, BRCA2, BRIP1, CDH1, CDK4, CDKN2A (p14ARF), CDKN2A (p16INK4a), CHEK2, CTNNA1, DICER1, EPCAM (Deletion/duplication testing only), GREM1 (promoter region deletion/duplication testing only), GREM1, HOXB13, KIT, MEN1, MLH1, MSH2, MSH3, MSH6, MUTYH, NBN, NF1, NHTL1, PALB2, PDGFRA, PMS2, POLD1, POLE, PTEN, RAD50, RAD51C, RAD51D, SDHA, SDHB, SDHC, SDHD, SMAD4, SMARCA4. STK11, TP53, TSC1, TSC2, and VHL.  The following genes were evaluated for sequence changes only: SDHA and HOXB13 c.251G>A variant only.   01/17/2021 Imaging   [facility] IMPRESSION Small lesion in the pancreatic head measuring approximately 1.1 x 0.8 cm without evidence of vascular involvement or metastasis consistent with previously described, biopsy-proven pancreatic adenocarcinoma.    02/08/2021 Surgery   Whipple Surgery with Dr Jyl Heinz  Final Pathologic Diagnosis      A.  GALLBLADDER, CHOLECYSTECTOMY: Chronic cholecystitis. No malignancy identified.   B.  BILE DUCT STENT, REMOVAL (GROSS ONLY DIAGNOSIS): Stent, see gross description.   C.  WHIPPLE RESECTION: No residual malignancy identified. Chronic and acute inflammation with granulation tissue reaction, fibrosis and features of chronic pancreatitis, suggestive of therapy effect. Fourteen lymph nodes, negative for metastasis (0/14). Margins negative for malignancy.       06/07/2021 Imaging   CT AP  IMPRESSION: 1. No current findings of recurrent malignancy. Interval Whipple procedure with expected postoperative findings. 2. A 3.5 cm stent is present in the dorsal pancreatic duct. No duct dilatation. There is an approximately 1.5 mm lucency centrally along this stent shown on image 43 series 7, possibilities include stent fracture, two separate stents, or an intentional radial lucency in this type of stent. 3. Small type 1 hiatal hernia. Mild distal esophageal wall thickening may reflect low-grade esophagitis. 4.  Aortic Atherosclerosis (ICD10-I70.0). 5.  Prominent stool throughout the colon favors constipation. 6. Uterine fibroid. 7. 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   Port-A-Cath in place    CURRENT THERAPY: Surveillance  INTERVAL HISTORY: Ms. Dobias returns for follow-up as scheduled.  She was last seen by Dr. Burr Medico 06/09/2021.  She is doing much better mentally and physically.  She is working full-time.  Energy and appetite are adequate, she has gained weight.  Still takes Creon 1 before and after meals 3 times daily and potassium.  She has 2-4 formed normal BMs daily, but odor is strong and she has gas.  She got a new puppy that is very active.  She has mild soreness at the right shoulder she attributes to the Port-A-Cath, questioning when it can  be removed.  Otherwise denies abdominal pain, bloating, nausea/vomiting, signs of jaundice, or any other changes. Her husband was on the phone, he had no concerns.    MEDICAL HISTORY:  Past Medical History:  Diagnosis Date   Anxiety    Cancer ([place]) 07/2020   pancreatic cancer   Depression    Diabetes mellitus without complication ([place]) 77/4128   pancreatic cancer   High cholesterol    Hypertension     SURGICAL HISTORY: Past Surgical History:  Procedure Laterality Date   BILIARY BRUSHING  08/22/2020   Procedure: BILIARY BRUSHING;  Surgeon: Clarene Essex, MD;  Location: [facility];  Service: Endoscopy;;   BILIARY STENT PLACEMENT N/A 08/22/2020   Procedure: BILIARY STENT PLACEMENT;  Surgeon: Clarene Essex, MD;  Location: [facility];  Service: Endoscopy;  Laterality: N/A;   ENDOSCOPIC RETROGRADE CHOLANGIOPANCREATOGRAPHY (ERCP) WITH PROPOFOL N/A 08/22/2020   Procedure: ENDOSCOPIC RETROGRADE CHOLANGIOPANCREATOGRAPHY (ERCP) WITH PROPOFOL;  Surgeon: Clarene Essex, MD;  Location: [facility];  Service: Endoscopy;  Laterality: N/A;   ESOPHAGOGASTRODUODENOSCOPY (EGD) WITH PROPOFOL N/A 08/24/2020   Procedure: ESOPHAGOGASTRODUODENOSCOPY (EGD) WITH PROPOFOL;  Surgeon: Arta Silence, MD;  Location: [facility];  Service: Endoscopy;  Laterality: N/A;   FINE NEEDLE ASPIRATION N/A 08/24/2020   Procedure: FINE NEEDLE ASPIRATION (FNA) LINEAR;  Surgeon: Arta Silence, MD;  Location: [facility];  Service: Endoscopy;  Laterality: N/A;   PORTACATH PLACEMENT Right 09/08/2020   Procedure: INSERTION PORT-A-CATH;  Surgeon: Dwan Bolt, MD;  Location: [place];  Service: General;  Laterality: Right;   SPHINCTEROTOMY  08/22/2020   Procedure: SPHINCTEROTOMY;  Surgeon: Clarene Essex, MD;  Location: [facility];  Service: Endoscopy;;   UPPER ESOPHAGEAL ENDOSCOPIC ULTRASOUND (EUS) N/A 08/24/2020   Procedure: UPPER ESOPHAGEAL ENDOSCOPIC ULTRASOUND (EUS);  Surgeon: Arta Silence, MD;  Location:  [facility];  Service: Endoscopy;  Laterality: N/A;    I have reviewed the social history and family history with the patient and they are unchanged from previous note.  ALLERGIES:  is allergic to oxycodone-acetaminophen, poison ivy extract, and tyloxapol.  MEDICATIONS:  Current Outpatient Medications  Medication Sig Dispense Refill   acetaminophen (TYLENOL) 325 MG tablet Take 650 mg by mouth every 6 (six) hours as needed.     citalopram (CELEXA) 40 MG tablet Take 10 mg by mouth daily.      cloNIDine (CATAPRES) 0.1 MG tablet Take 0.1 mg by mouth at bedtime.     CREON 36000-114000 units CPEP capsule TAKE 2 CAPSULES BY MOUTH THREE TIMES DAILY WITH MEALS. MAY ALSO TAKE 1 CAPSULE AS NEEDED WITH SNACKS 240 capsule 2   hydrochlorothiazide (HYDRODIURIL) 12.5 MG tablet Take 12.5 mg by mouth daily.     potassium chloride (KLOR-CON) 10 MEQ tablet Take 1 tablet (10 mEq total) by mouth 3 (three) times daily. 90 tablet 1   simvastatin (ZOCOR) 80 MG tablet Take 80 mg by mouth daily.     magnesium oxide (MAG-OX) 400 (241.3 Mg) MG tablet Take 1 tablet (400 mg total) by mouth 2 (two) times daily. (Patient not taking: Reported on 08/09/2021) 60 tablet 0   omeprazole (PRILOSEC) 20 MG capsule Take 20 mg by mouth daily.     No current facility-administered medications for this visit.    PHYSICAL EXAMINATION: ECOG PERFORMANCE STATUS: 0 - Asymptomatic  Vitals:   08/09/21 0912  BP: 115/60  Pulse: (!) 58  Resp: 17  Temp: 97.8 F (36.6  C)  SpO2: 99%   Filed Weights   08/09/21 0912  Weight: 123 lb 9.6 oz (56.1 kg)    GENERAL:alert, no distress and comfortable SKIN: No rash EYES: sclera clear NECK: Without mass LYMPH:  no palpable cervical or supraclavicular lymphadenopathy  LUNGS: clear with normal breathing effort HEART: regular rate & rhythm, no lower extremity edema ABDOMEN:abdomen soft, non-tender and normal bowel sounds.  Midline incision well-healed with minimal scar  tissue Musculoskeletal: No focal tenderness at the right shoulder NEURO: alert & oriented x 3 with fluent speech, no focal motor/sensory deficits PAC without erythema  LABORATORY DATA:  I have reviewed the data as listed CBC Latest Ref Rng & Units 08/09/2021 06/07/2021 03/10/2021  WBC 4.0 - 10.5 K/uL 6.1 7.0 6.2  Hemoglobin 12.0 - 15.0 g/dL 11.4(L) 11.4(L) 11.1(L)  Hematocrit 36.0 - 46.0 % 36.0 35.8(L) 35.5(L)  Platelets 150 - 400 K/uL 270 282 257     CMP Latest Ref Rng & Units 08/09/2021 06/07/2021 03/10/2021  Glucose 70 - 99 mg/dL 96 89 102(H)  BUN 8 - 23 mg/dL 12 12 5(L)  Creatinine 0.44 - 1.00 mg/dL 0.76 0.74 0.69  Sodium 135 - 145 mmol/L 142 141 140  Potassium 3.5 - 5.1 mmol/L 3.6 4.2 3.6  Chloride 98 - 111 mmol/L 108 102 106  CO2 22 - 32 mmol/L _0 Calcium 8.9 - 10.3 mg/dL 9.2 9.4 8.6(L)  Total Protein 6.5 - 8.1 g/dL 6.7 7.6 6.5  Total Bilirubin 0.3 - 1.2 mg/dL 1.2 1.2 0.5  Alkaline Phos 38 - 126 U/L 193(H) 217(H) 273(H)  AST 15 - 41 U/L 170(H) 60(H) 64(H)  ALT 0 - 44 U/L 115(H) 43 33      RADIOGRAPHIC STUDIES: I have personally reviewed the radiological images as listed and agreed with the findings in the report. No results found.   ASSESSMENT & PLAN: TALLULAH HOSMAN is a 60 y.o. female with    1. 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 staging was negative for metastatic disease.  -Given the vascular invasion, this was borderline resectable. She completed neoadjuvant chemo FOLFIRINOX q2 weeks 09/12/20 - 12/29/20 to downstage her disease.  -She proceeded with whipple surgery by Dr Crisoforo Oxford on 02/08/21. Her surgical path showed no residual disease. She overall had complete response. Adjuvant chemo was not  recommended  -Surveillance CT AP 06/07/21 was negative for recurrence. Repeat 6 months (11/2021) -due to the high recurrence risk of pancreatic cancer, she is under close surveillance. She has some soreness near [facility] site but agrees to keep in place until at least her next surveillance scan. She understands if she has recurrence in the future we would need this.    2. HTN -meds adjusted during chemo, back on HCTZ and other meds -Continue to F/u with PCP    Disposition:  Ms. Eagleson is clinically doing well. Exam is benign. CBC is stable. CMP shows elevated AST/ALT which were elevated during chemo but had improved since 12/2020. Alk phos is stable and Tbili is normal. She does have evidence of hepatic steatosis and a pancreatic stent is in place.  Otherwise no clinical concern for recurrence.   If CA 19-9 is elevated, will proceed with further work up either abd [place] or repeat CT. If tumor marker  is normal, will repeat CMP in 2-3 weeks and monitor LFTs closely.   If labs normalize and no other concerns, f/up in 2 months.   In the meantime I encouraged her to eat healthy, hydrate, exercise, avoid smoking, limit alcohol, and monitor for any other concerning changes including juandice, n/v, abd pain, bloating, unintentional weight loss.   The plan was reviewed with Dr. Burr Medico.   All questions were answered. The patient knows to call the clinic with any problems, questions or concerns. No barriers to learning were detected. Total encounter time was 30 minutes.      Alla Feeling, NP 08/09/21

## 2021-08-09 ENCOUNTER — Other Ambulatory Visit: Payer: Self-pay

## 2021-08-09 ENCOUNTER — Encounter: Payer: Self-pay | Admitting: Nurse Practitioner

## 2021-08-09 ENCOUNTER — Inpatient Hospital Stay: Payer: BC Managed Care – PPO | Attending: Hematology | Admitting: Nurse Practitioner

## 2021-08-09 ENCOUNTER — Inpatient Hospital Stay: Payer: BC Managed Care – PPO

## 2021-08-09 VITALS — BP 115/60 | HR 58 | Temp 97.8°F | Resp 17 | Ht 62.0 in | Wt 123.6 lb

## 2021-08-09 DIAGNOSIS — Z9221 Personal history of antineoplastic chemotherapy: Secondary | ICD-10-CM | POA: Diagnosis not present

## 2021-08-09 DIAGNOSIS — Z95828 Presence of other vascular implants and grafts: Secondary | ICD-10-CM

## 2021-08-09 DIAGNOSIS — I10 Essential (primary) hypertension: Secondary | ICD-10-CM | POA: Insufficient documentation

## 2021-08-09 DIAGNOSIS — Z79899 Other long term (current) drug therapy: Secondary | ICD-10-CM | POA: Insufficient documentation

## 2021-08-09 DIAGNOSIS — C250 Malignant neoplasm of head of pancreas: Secondary | ICD-10-CM

## 2021-08-09 DIAGNOSIS — E119 Type 2 diabetes mellitus without complications: Secondary | ICD-10-CM | POA: Insufficient documentation

## 2021-08-09 DIAGNOSIS — C259 Malignant neoplasm of pancreas, unspecified: Secondary | ICD-10-CM

## 2021-08-09 LAB — CBC WITH DIFFERENTIAL (CANCER CENTER ONLY)
Abs Immature Granulocytes: 0.03 10*3/uL (ref 0.00–0.07)
Basophils Absolute: 0 10*3/uL (ref 0.0–0.1)
Basophils Relative: 1 %
Eosinophils Absolute: 0.1 10*3/uL (ref 0.0–0.5)
Eosinophils Relative: 2 %
HCT: 36 % (ref 36.0–46.0)
Hemoglobin: 11.4 g/dL — ABNORMAL LOW (ref 12.0–15.0)
Immature Granulocytes: 1 %
Lymphocytes Relative: 24 %
Lymphs Abs: 1.5 10*3/uL (ref 0.7–4.0)
MCH: 27.5 pg (ref 26.0–34.0)
MCHC: 31.7 g/dL (ref 30.0–36.0)
MCV: 87 fL (ref 80.0–100.0)
Monocytes Absolute: 0.6 10*3/uL (ref 0.1–1.0)
Monocytes Relative: 10 %
Neutro Abs: 3.8 10*3/uL (ref 1.7–7.7)
Neutrophils Relative %: 62 %
Platelet Count: 270 10*3/uL (ref 150–400)
RBC: 4.14 MIL/uL (ref 3.87–5.11)
RDW: 14.4 % (ref 11.5–15.5)
WBC Count: 6.1 10*3/uL (ref 4.0–10.5)
nRBC: 0 % (ref 0.0–0.2)

## 2021-08-09 LAB — CMP (CANCER CENTER ONLY)
ALT: 115 U/L — ABNORMAL HIGH (ref 0–44)
AST: 170 U/L — ABNORMAL HIGH (ref 15–41)
Albumin: 3.6 g/dL (ref 3.5–5.0)
Alkaline Phosphatase: 193 U/L — ABNORMAL HIGH (ref 38–126)
Anion gap: 9 (ref 5–15)
BUN: 12 mg/dL (ref 8–23)
CO2: 25 mmol/L (ref 22–32)
Calcium: 9.2 mg/dL (ref 8.9–10.3)
Chloride: 108 mmol/L (ref 98–111)
Creatinine: 0.76 mg/dL (ref 0.44–1.00)
GFR, Estimated: >60 mL/min (ref >60)
Glucose, Bld: 96 mg/dL (ref 70–99)
Potassium: 3.6 mmol/L (ref 3.5–5.1)
Sodium: 142 mmol/L (ref 135–145)
Total Bilirubin: 1.2 mg/dL (ref 0.3–1.2)
Total Protein: 6.7 g/dL (ref 6.5–8.1)

## 2021-08-09 MED ORDER — HEPARIN SOD (PORK) LOCK FLUSH 100 UNIT/ML IV SOLN
500.0000 [IU] | Freq: Once | INTRAVENOUS | Status: AC | PRN
  Administered 2021-08-09: 500 [IU]

## 2021-08-09 MED ORDER — SODIUM CHLORIDE 0.9% FLUSH
10.0000 mL | INTRAVENOUS | Status: DC | PRN
  Administered 2021-08-09: 10 mL

## 2021-08-10 LAB — CANCER ANTIGEN 19-9: CA 19-9: 7 U/mL (ref 0–35)

## 2021-10-03 ENCOUNTER — Other Ambulatory Visit: Payer: Self-pay | Admitting: Nurse Practitioner

## 2021-10-03 DIAGNOSIS — C250 Malignant neoplasm of head of pancreas: Secondary | ICD-10-CM

## 2021-10-03 NOTE — Progress Notes (Signed)
[place]   Telephone:([phone]) [phone] [phone]   Clinic Follow up Note   Patient Care Team: Nicola Girt, DO as PCP - General (Internal Medicine) Truitt Merle, MD as Consulting Physician (Oncology) Dwan Bolt, MD as Consulting Physician (General Surgery) 10/05/2021  CHIEF COMPLAINT: Follow up h/o pancreas cancer   SUMMARY OF ONCOLOGIC HISTORY: Oncology History Overview Note  Cancer Staging Pancreatic cancer [facility]) Staging form: Exocrine Pancreas, AJCC 8th Edition - Clinical stage from 08/24/2020: Stage IIB (cT2, cN1, cM0) - Signed by Truitt Merle, MD on 08/30/2020 Stage prefix: Initial diagnosis    Pancreatic cancer ([place])  08/20/2020 Imaging   CT AP 08/20/20  IMPRESSION: 1. 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 Collateral circulation is noted. There is moderate intrahepatic and extrahepatic biliary dilatation which appears to be due to the pancreatic head mass. Pancreatic ductal dilatation is noted as well. 2. Probable 2.7 cm uterine fibroid. 3. Aortic atherosclerosis.   Aortic Atherosclerosis (ICD10-I70.0).   08/20/2020 Tumor Marker   Ca19-9 - 927   08/22/2020 Procedure   ERCP by Dr Watt Climes 08/22/20  IMPRESSION - The major papilla appeared normal. - A biliary sphincterotomy was performed. - Cells for cytology obtained in the lower third and middle of the main duct. - One plastic stent was placed into the common bile duct.   FINAL MICROSCOPIC DIAGNOSIS:  - No malignant cells identified  - Benign reactive/reparative changes   08/24/2020 Cancer Staging   Staging form: Exocrine Pancreas, AJCC 8th Edition - Clinical stage from 08/24/2020: Stage IIB (cT2, cN1, cM0) - Signed by Truitt Merle, MD on 08/30/2020    08/24/2020 Procedure   EUS by Dr Paulita Fujita 08/24/20   IMPRESSION - There was no sign of significant pathology in the ampulla. - A few malignant-appearing lymph nodes were visualized in the peripancreatic region and porta hepatis region. - One stent was visualized endosonographically in the common bile duct. - A mass was identified in the pancreatic head. Tissue was obtained from this exam. The preliminary diagnosis is consistent with adenocarcinoma. Invasion into SMV/PV seen. Lymphadenopathy noted. This was staged T3 N1 Mx by endosonographic criteria. Fine needle aspiration performed.   08/24/2020 Initial Biopsy   FINAL MICROSCOPIC DIAGNOSIS: 08/24/20 - Malignant cells consistent with adenocarcinoma    08/30/2020 Initial Diagnosis   Pancreatic cancer ([place])   09/05/2020 Imaging   CT Chest  IMPRESSION: 1. Stable 2.7 cm infiltrating pancreatic head mass with borderline enlarged peripancreatic lymph nodes. 2. No findings for pulmonary metastatic disease. 3. Small hiatal hernia. 4. Aortic atherosclerosis.   Aortic Atherosclerosis (ICD10-I70.0).   09/08/2020 Procedure   INSERTION PORT-A-CATH by Dr Zenia Resides and Barry Dienes    09/12/2020 - 12/29/2020 Chemotherapy   Neoadjuvant FOLFIRINOX q2 weeks starting 09/12/20-12/29/20    12/02/2020 Imaging   CT CAP  IMPRESSION: 1. Previously noted mass of the central pancreatic head is almost entirely resolved, difficult to discretely appreciate on current examination. Findings are consistent with treatment response. There remains obstruction of the pancreatic duct near the head neck junction with mild prominence of the pancreatic duct, measuring up to 5 mm, with atrophy of the distal pancreatic parenchyma. 2. The portal vein, splenic vein, and superior mesenteric vein are now widely patent, previously effaced at the confluence by mass effect. 3. Interval placement of common bile duct  stent, tip positioned in the distal duodenum, with relief of previously seen biliary ductal dilatation. 4. 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 Following the bifurcation of the celiac mesenteric trunk, the celiac axis appears to directly traverse the vicinity of the mass, although there does appear to be a fat plane about the vessel. 5. The distal small bowel and colon are diffusely somewhat hyperenhancing and inflamed appearing with vascular combing and a tethered appearance of the distal small bowel. This appearance generally suggests inflammatory bowel disease such as Crohn's disease. Correlate with referable clinical history, if present. No evidence of obstruction or other acute complication. 6. Hepatic steatosis. 7. Aortic atherosclerosis.   12/28/2020 Genetic Testing   Negative hereditary cancer genetic testing: no pathogenic variants detected in [employer] Common Hereditary Cancers Panel.  The report date is December 28, 2020.    The Common Hereditary Cancers Panel offered by [employer] includes sequencing and/or deletion duplication testing of the following 47 genes: APC, ATM, AXIN2, BARD1, BMPR1A, BRCA1, BRCA2, BRIP1, CDH1, CDK4, CDKN2A (p14ARF), CDKN2A (p16INK4a), CHEK2, CTNNA1, DICER1, EPCAM (Deletion/duplication testing only), GREM1 (promoter region deletion/duplication testing only), GREM1, HOXB13, KIT, MEN1, MLH1, MSH2, MSH3, MSH6, MUTYH, NBN, NF1, NHTL1, PALB2, PDGFRA, PMS2, POLD1, POLE, PTEN, RAD50, RAD51C, RAD51D, SDHA, SDHB, SDHC, SDHD, SMAD4, SMARCA4. STK11, TP53, TSC1, TSC2, and VHL.  The following genes were evaluated for sequence changes only: SDHA and HOXB13 c.251G>A variant only.   01/17/2021 Imaging   CT CAP from [facility] IMPRESSION Small lesion in the pancreatic head measuring approximately 1.1 x 0.8 cm without evidence of vascular involvement or metastasis consistent with previously described, biopsy-proven pancreatic  adenocarcinoma.   02/08/2021 Surgery   Whipple Surgery with Dr Jyl Heinz  Final Pathologic Diagnosis      A.  GALLBLADDER, CHOLECYSTECTOMY: Chronic cholecystitis. No malignancy identified.   B.  BILE DUCT STENT, REMOVAL (GROSS ONLY DIAGNOSIS): Stent, see gross description.   C.  WHIPPLE RESECTION: No residual malignancy identified. Chronic and acute inflammation with granulation tissue reaction, fibrosis and features of chronic pancreatitis, suggestive of therapy effect. Fourteen lymph nodes, negative for metastasis (0/14). Margins negative for malignancy.       06/07/2021 Imaging   CT AP  IMPRESSION: 1. No current findings of recurrent malignancy. Interval Whipple procedure with expected postoperative findings. 2. A 3.5 cm stent is present in the dorsal pancreatic duct. No duct dilatation. There is an approximately 1.5 mm lucency centrally along this stent shown on image 43 series 7, possibilities include stent fracture, two separate stents, or an intentional radial lucency in this type of stent. 3. Small type 1 hiatal hernia. Mild distal esophageal wall thickening may reflect low-grade esophagitis. 4.  Aortic Atherosclerosis (ICD10-I70.0). 5.  Prominent stool throughout the colon favors constipation. 6. Uterine fibroid. 7. 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   Port-A-Cath in place    CURRENT THERAPY: Surveillance   INTERVAL HISTORY: Ms. Witcher returns for follow up as scheduled. Last seen by me 08/09/21.  She feels very well, working again and less stressed in general.  Energy and appetite are adequate, weight is stable.  She is down to taking 1 Creon, bowel movements fluctuate with diet.  Denies bloody stool, nausea/vomiting, signs of jaundice, abdominal pain or bloating.  She had her annual wellness visit in September and received a flu shot.  Denies  recent fever, chills, cough, chest pain, dyspnea, leg edema, or any other  concerns.  She would like to get CT done and port removed by the end of this year due to cost/insurance.   MEDICAL HISTORY:  Past Medical History:  Diagnosis Date   Anxiety    Cancer ([place]) 07/2020   pancreatic cancer   Depression    Diabetes mellitus without complication ([place]) 47/4259   pancreatic cancer   High cholesterol    Hypertension     SURGICAL HISTORY: Past Surgical History:  Procedure Laterality Date   BILIARY BRUSHING  08/22/2020   Procedure: BILIARY BRUSHING;  Surgeon: Clarene Essex, MD;  Location: [facility];  Service: Endoscopy;;   BILIARY STENT PLACEMENT N/A 08/22/2020   Procedure: BILIARY STENT PLACEMENT;  Surgeon: Clarene Essex, MD;  Location: [facility];  Service: Endoscopy;  Laterality: N/A;   ENDOSCOPIC RETROGRADE CHOLANGIOPANCREATOGRAPHY (ERCP) WITH PROPOFOL N/A 08/22/2020   Procedure: ENDOSCOPIC RETROGRADE CHOLANGIOPANCREATOGRAPHY (ERCP) WITH PROPOFOL;  Surgeon: Clarene Essex, MD;  Location: [facility];  Service: Endoscopy;  Laterality: N/A;   ESOPHAGOGASTRODUODENOSCOPY (EGD) WITH PROPOFOL N/A 08/24/2020   Procedure: ESOPHAGOGASTRODUODENOSCOPY (EGD) WITH PROPOFOL;  Surgeon: Arta Silence, MD;  Location: [facility];  Service: Endoscopy;  Laterality: N/A;   FINE NEEDLE ASPIRATION N/A 08/24/2020   Procedure: FINE NEEDLE ASPIRATION (FNA) LINEAR;  Surgeon: Arta Silence, MD;  Location: [facility];  Service: Endoscopy;  Laterality: N/A;   PORTACATH PLACEMENT Right 09/08/2020   Procedure: INSERTION PORT-A-CATH;  Surgeon: Dwan Bolt, MD;  Location: [facility];  Service: General;  Laterality: Right;   SPHINCTEROTOMY  08/22/2020   Procedure: SPHINCTEROTOMY;  Surgeon: Clarene Essex, MD;  Location: [facility];  Service: Endoscopy;;   UPPER ESOPHAGEAL ENDOSCOPIC ULTRASOUND (EUS) N/A 08/24/2020   Procedure: UPPER ESOPHAGEAL ENDOSCOPIC ULTRASOUND (EUS);  Surgeon: Arta Silence, MD;  Location: [facility];  Service: Endoscopy;  Laterality: N/A;    I  have reviewed the social history and family history with the patient and they are unchanged from previous note.  ALLERGIES:  is allergic to oxycodone-acetaminophen, poison ivy extract, and tyloxapol.  MEDICATIONS:  Current Outpatient Medications  Medication Sig Dispense Refill   acetaminophen (TYLENOL) 325 MG tablet Take 650 mg by mouth every 6 (six) hours as needed.     citalopram (CELEXA) 40 MG tablet Take 10 mg by mouth daily.      cloNIDine (CATAPRES) 0.1 MG tablet Take 0.1 mg by mouth at bedtime.     CREON 36000-114000 units CPEP capsule TAKE 2 CAPSULES BY MOUTH THREE TIMES DAILY WITH MEALS. MAY ALSO TAKE 1 CAPSULE AS NEEDED WITH SNACKS 240 capsule 2   hydrochlorothiazide (HYDRODIURIL) 12.5 MG tablet Take 12.5 mg by mouth daily.     magnesium oxide (MAG-OX) 400 (241.3 Mg) MG tablet Take 1 tablet (400 mg total) by mouth 2 (two) times daily. (Patient not taking: Reported on 08/09/2021) 60 tablet 0   omeprazole (PRILOSEC) 20 MG capsule Take 20 mg by mouth daily.     potassium chloride (KLOR-CON) 10 MEQ tablet Take 1 tablet (10 mEq total) by mouth 3 (three) times daily. 90 tablet 1   simvastatin (ZOCOR) 80 MG tablet Take 80 mg by mouth daily.     Current Facility-Administered Medications  Medication Dose Route Frequency Provider Last Rate Last Admin   sodium chloride flush (NS) 0.9 % injection 10 mL  10 mL Intravenous PRN Alla Feeling, NP   10 mL at 10/05/21 0935    PHYSICAL EXAMINATION: ECOG PERFORMANCE STATUS: 0 -  Asymptomatic  Vitals:   10/05/21 0858  BP: 113/66  Pulse: 60  Resp: 17  Temp: 97.7 F (36.5 C)  SpO2: 100%   Filed Weights   10/05/21 0858  Weight: 123 lb 14.4 oz (56.2 kg)    GENERAL:alert, no distress and comfortable SKIN: No rash or jaundice EYES: sclera anicteric  NECK: Without mass LYMPH:  no palpable cervical or supraclavicular lymphadenopathy LUNGS: clear with normal breathing effort HEART: regular rate & rhythm, no lower extremity  edema ABDOMEN:abdomen soft, non-tender and normal bowel sounds.  Midline incision completely healed, minimal scar tissue, no nodularity or palpable mass along the incision NEURO: alert & oriented x 3 with fluent speech, no focal motor/sensory deficits PAC without erythema  LABORATORY DATA:  I have reviewed the data as listed CBC Latest Ref Rng & Units 10/05/2021 08/09/2021 06/07/2021  WBC 4.0 - 10.5 K/uL 6.0 6.1 7.0  Hemoglobin 12.0 - 15.0 g/dL 12.4 11.4(L) 11.4(L)  Hematocrit 36.0 - 46.0 % 38.6 36.0 35.8(L)  Platelets 150 - 400 K/uL 252 270 282     CMP Latest Ref Rng & Units 10/05/2021 08/09/2021 06/07/2021  Glucose 70 - 99 mg/dL 94 96 89  BUN 8 - 23 mg/dL _0 Creatinine 0.44 - 1.00 mg/dL 0.94 0.76 0.74  Sodium 135 - 145 mmol/L 139 142 141  Potassium 3.5 - 5.1 mmol/L 4.0 3.6 4.2  Chloride 98 - 111 mmol/L 104 108 102  CO2 22 - 32 mmol/L _1 Calcium 8.9 - 10.3 mg/dL 9.0 9.2 9.4  Total Protein 6.5 - 8.1 g/dL 7.0 6.7 7.6  Total Bilirubin 0.3 - 1.2 mg/dL 1.1 1.2 1.2  Alkaline Phos 38 - 126 U/L 114 193(H) 217(H)  AST 15 - 41 U/L 49(H) 170(H) 60(H)  ALT 0 - 44 U/L 50(H) 115(H) 43      RADIOGRAPHIC STUDIES: I have personally reviewed the radiological images as listed and agreed with the findings in the report. No results found.   ASSESSMENT & PLAN: Jacqueline Tyler is a 60 y.o. female with    1. 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 staging was negative for metastatic disease.  -Given the vascular invasion, this was borderline resectable. She completed neoadjuvant chemo FOLFIRINOX q2 weeks 09/12/20 - 12/29/20 to downstage her disease.  -She proceeded with whipple surgery by Dr Crisoforo Oxford on 02/08/21. Her surgical  path showed no residual disease. She overall had complete response. Adjuvant chemo was not recommended  -Surveillance CT AP 06/07/21 was negative for recurrence. Repeat 6 months (11/2021) -due to the high recurrence risk of pancreatic cancer, she is under close surveillance.    2. HTN -meds adjusted during chemo -Continue to F/u with PCP  Disposition: Ms. Apel is clinically doing well.  Exam is benign, CBC is normal, CMP with improved LFTs.  CA 19-9 is pending.  Overall there is no clinical concern for recurrence.  She is being referred for surveillance CT of the abdomen and pelvis by the end of the year, then follow-up in January.  I encouraged her to continue healthy lifestyle, avoid smoking, limit alcohol and (and Tylenol for transaminitis).  She is up-to-date on age-appropriate cancer screenings, she will call PCP for mammogram.  We reviewed the high recurrence risk of pancreatic adenocarcinoma.  She is a year from her initial diagnosis.  She is requesting her Port-A-Cath to be removed, she will call [facility] and Dr. Zenia Resides to price out the procedure.  She understands if she has recurrence in the future, port likely will need to be replaced.  We reviewed signs and symptoms of recurrence.  Follow-up in 2 months, or sooner if needed.   Orders Placed This Encounter  Procedures   CT Abdomen Pelvis W Contrast    Standing Status:   Future    Standing Expiration Date:   10/05/2022    Order Specific Question:   If indicated for the ordered procedure, I authorize the administration of contrast media per Radiology protocol    Answer:   Yes    Order Specific Question:   Preferred imaging location?    Answer:   [facility]    Order Specific Question:   Is Oral Contrast requested for this exam?    Answer:   Yes, Per Radiology protocol    All questions were answered. The patient knows to call the clinic with any problems, questions or concerns. No barriers to learning were detected.      Alla Feeling, NP 10/05/21

## 2021-10-05 ENCOUNTER — Inpatient Hospital Stay (HOSPITAL_BASED_OUTPATIENT_CLINIC_OR_DEPARTMENT_OTHER): Payer: BC Managed Care – PPO | Admitting: Nurse Practitioner

## 2021-10-05 ENCOUNTER — Encounter: Payer: Self-pay | Admitting: Nurse Practitioner

## 2021-10-05 ENCOUNTER — Other Ambulatory Visit: Payer: Self-pay

## 2021-10-05 ENCOUNTER — Inpatient Hospital Stay: Payer: BC Managed Care – PPO | Attending: Hematology

## 2021-10-05 VITALS — BP 113/66 | HR 60 | Temp 97.7°F | Resp 17 | Ht 62.0 in | Wt 123.9 lb

## 2021-10-05 DIAGNOSIS — R609 Edema, unspecified: Secondary | ICD-10-CM | POA: Insufficient documentation

## 2021-10-05 DIAGNOSIS — C250 Malignant neoplasm of head of pancreas: Secondary | ICD-10-CM | POA: Diagnosis not present

## 2021-10-05 DIAGNOSIS — Z95828 Presence of other vascular implants and grafts: Secondary | ICD-10-CM

## 2021-10-05 DIAGNOSIS — I700 Atherosclerosis of aorta: Secondary | ICD-10-CM | POA: Insufficient documentation

## 2021-10-05 DIAGNOSIS — D259 Leiomyoma of uterus, unspecified: Secondary | ICD-10-CM | POA: Insufficient documentation

## 2021-10-05 DIAGNOSIS — I10 Essential (primary) hypertension: Secondary | ICD-10-CM | POA: Insufficient documentation

## 2021-10-05 DIAGNOSIS — K449 Diaphragmatic hernia without obstruction or gangrene: Secondary | ICD-10-CM | POA: Diagnosis not present

## 2021-10-05 DIAGNOSIS — Z79899 Other long term (current) drug therapy: Secondary | ICD-10-CM | POA: Insufficient documentation

## 2021-10-05 DIAGNOSIS — E119 Type 2 diabetes mellitus without complications: Secondary | ICD-10-CM | POA: Insufficient documentation

## 2021-10-05 LAB — CBC WITH DIFFERENTIAL (CANCER CENTER ONLY)
Abs Immature Granulocytes: 0.01 10*3/uL (ref 0.00–0.07)
Basophils Absolute: 0 10*3/uL (ref 0.0–0.1)
Basophils Relative: 0 %
Eosinophils Absolute: 0.1 10*3/uL (ref 0.0–0.5)
Eosinophils Relative: 2 %
HCT: 38.6 % (ref 36.0–46.0)
Hemoglobin: 12.4 g/dL (ref 12.0–15.0)
Immature Granulocytes: 0 %
Lymphocytes Relative: 30 %
Lymphs Abs: 1.8 10*3/uL (ref 0.7–4.0)
MCH: 27.7 pg (ref 26.0–34.0)
MCHC: 32.1 g/dL (ref 30.0–36.0)
MCV: 86.2 fL (ref 80.0–100.0)
Monocytes Absolute: 0.6 10*3/uL (ref 0.1–1.0)
Monocytes Relative: 10 %
Neutro Abs: 3.4 10*3/uL (ref 1.7–7.7)
Neutrophils Relative %: 58 %
Platelet Count: 252 10*3/uL (ref 150–400)
RBC: 4.48 MIL/uL (ref 3.87–5.11)
RDW: 13.8 % (ref 11.5–15.5)
WBC Count: 6 10*3/uL (ref 4.0–10.5)
nRBC: 0 % (ref 0.0–0.2)

## 2021-10-05 LAB — CMP (CANCER CENTER ONLY)
ALT: 50 U/L — ABNORMAL HIGH (ref 0–44)
AST: 49 U/L — ABNORMAL HIGH (ref 15–41)
Albumin: 4 g/dL (ref 3.5–5.0)
Alkaline Phosphatase: 114 U/L (ref 38–126)
Anion gap: 11 (ref 5–15)
BUN: 18 mg/dL (ref 8–23)
CO2: 24 mmol/L (ref 22–32)
Calcium: 9 mg/dL (ref 8.9–10.3)
Chloride: 104 mmol/L (ref 98–111)
Creatinine: 0.94 mg/dL (ref 0.44–1.00)
GFR, Estimated: >60 mL/min (ref >60)
Glucose, Bld: 94 mg/dL (ref 70–99)
Potassium: 4 mmol/L (ref 3.5–5.1)
Sodium: 139 mmol/L (ref 135–145)
Total Bilirubin: 1.1 mg/dL (ref 0.3–1.2)
Total Protein: 7 g/dL (ref 6.5–8.1)

## 2021-10-05 MED ORDER — HEPARIN SOD (PORK) LOCK FLUSH 100 UNIT/ML IV SOLN
500.0000 [IU] | Freq: Once | INTRAVENOUS | Status: AC
  Administered 2021-10-05: 10:00:00 500 [IU] via INTRAVENOUS

## 2021-10-05 MED ORDER — SODIUM CHLORIDE 0.9% FLUSH
10.0000 mL | INTRAVENOUS | Status: DC | PRN
  Administered 2021-10-05: 10:00:00 10 mL via INTRAVENOUS

## 2021-10-05 MED ORDER — SODIUM CHLORIDE 0.9% FLUSH
10.0000 mL | INTRAVENOUS | Status: DC | PRN
  Administered 2021-10-05: 09:00:00 10 mL

## 2021-10-05 NOTE — Patient Instructions (Signed)

## 2021-10-06 ENCOUNTER — Telehealth: Payer: Self-pay

## 2021-10-06 LAB — CANCER ANTIGEN 19-9: CA 19-9: 3 U/mL (ref 0–35)

## 2021-10-06 NOTE — Telephone Encounter (Signed)
This nurse reached out to patient to inform her of lab results and provider recommendation.  Patient did not answer and this nurse unable to leave a voicemail message.  This nurse will also send a My Chart message.  No concerns noted at this time.

## 2021-10-06 NOTE — Telephone Encounter (Signed)
-----   Message from Alla Feeling, NP sent at 10/05/2021 10:48 AM EST ----- Please let pt know LFTs have improved, no concerns. We will contact her with CA 19-9 result. Thanks, Regan Rakers, NP

## 2021-10-10 ENCOUNTER — Telehealth: Payer: Self-pay

## 2021-10-10 ENCOUNTER — Other Ambulatory Visit: Payer: Self-pay | Admitting: Hematology

## 2021-10-10 NOTE — Telephone Encounter (Signed)
Spoke with pt regarding her CA 19-9 test results.  CA 19-9 test results are negative.  Pt had no other concerns or questions.  Pt stated she will be contacting central scheduling to schedule her CT Scan.

## 2021-10-23 ENCOUNTER — Ambulatory Visit (HOSPITAL_COMMUNITY)
Admission: RE | Admit: 2021-10-23 | Discharge: 2021-10-23 | Disposition: A | Discharge status: Home / Self Care | Payer: BC Managed Care – PPO | Source: Ambulatory Visit | Attending: Nurse Practitioner | Admitting: Nurse Practitioner

## 2021-10-23 ENCOUNTER — Telehealth: Payer: Self-pay

## 2021-10-23 ENCOUNTER — Encounter (HOSPITAL_COMMUNITY): Payer: Self-pay

## 2021-10-23 DIAGNOSIS — C250 Malignant neoplasm of head of pancreas: Secondary | ICD-10-CM | POA: Insufficient documentation

## 2021-10-23 MED ORDER — IOHEXOL 350 MG/ML SOLN
80.0000 mL | Freq: Once | INTRAVENOUS | Status: AC | PRN
  Administered 2021-10-23: 80 mL via INTRAVENOUS

## 2021-10-23 MED ORDER — SODIUM CHLORIDE (PF) 0.9 % IJ SOLN
INTRAMUSCULAR | Status: AC
  Filled 2021-10-23: qty 50

## 2021-10-23 NOTE — Telephone Encounter (Signed)
Pt called and made aware that per Dr.Feng ok to schedule port removal. Pt agreeable and had no further questions at this time.

## 2021-10-23 NOTE — Telephone Encounter (Signed)
Pt in lobby requesting to have a port-removal consultation. Will notify MD, pt made aware of next appointment in January. No further questions at this time.

## 2021-10-26 ENCOUNTER — Telehealth: Payer: Self-pay | Admitting: Hematology

## 2021-10-26 NOTE — Telephone Encounter (Signed)
Rescheduled upcoming appointment due to provider's breast clinic. Patient is aware of changes. 

## 2021-11-01 ENCOUNTER — Other Ambulatory Visit: Payer: Self-pay | Admitting: Hematology

## 2021-11-14 ENCOUNTER — Ambulatory Visit: Payer: Self-pay | Admitting: Surgery

## 2021-11-14 NOTE — H&P (View-Only) (Signed)
History of Present Illness: Jacqueline Tyler is a 60 y.o. female who is seen today to discuss port removal. 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 She underwent placement of a right subclavian portacath by me on 09/08/20, and subsequently underwent treatment with neoadjuvant FOLFIRINOX. Restaging scans showed a good response of the primary tumor to chemotherapy, and she underwent a Whipple by Dr. Crisoforo Oxford at [facility] in March 2022. Surgical pathology showed no residual carcinoma, and adjuvant chemotherapy was not recommended. She follows with Dr. Burr Medico and has been undergoing close surveillance. Her most recent scans on 10/23/21 showed no evidence of recurrent or metastatic disease. She would like to have her port removed and presents today to discuss surgery. She says the port bothers her and causes pain in certain positions. She has it flushed every 2 months and says there have been no issues flushing or drawing back blood from the port.     Review of Systems: A complete review of systems was obtained from the patient.  I have reviewed this information and discussed as appropriate with the patient.  See HPI as well for other ROS.     Medical History: Past Medical History Past Medical History: Diagnosis Date  Anxiety    History of cancer    History of stroke    Hypertension        Patient Active Problem List Diagnosis  Port-A-Cath in place  History of pancreatic cancer     Past Surgical History Past Surgical History: Procedure Laterality Date  PANCREATICODUODENECTOMY W/PANCREATICOJEJUNOSTOMY WHIPPLE      Portacath placement          Allergies Allergies Allergen Reactions  Oxycodone-Acetaminophen Nausea     Patient was unsure / doesn't recall taking     Poison Ivy Extract Hives     Patient states HIGHLY allergice Patient states HIGHLY allergice    Tyloxapol Other (See Comments)     Nausea   Nausea  Nausea         Current Outpatient Medications on File Prior to Visit Medication Sig Dispense Refill  hydroCHLOROthiazide (MICROZIDE) 12.5 mg capsule Take 1 capsule by mouth once daily      potassium chloride (KLOR-CON) 10 MEQ ER tablet Take 1 tablet by mouth once daily      citalopram (CELEXA) 40 MG tablet Take 40 mg by mouth every evening      cloNIDine HCL (CATAPRES) 0.1 MG tablet Take 0.1 mg by mouth at bedtime      simvastatin (ZOCOR) 80 MG tablet Take 80 mg by mouth at bedtime       No current facility-administered medications on file prior to visit.     Family History Family History Problem Relation Age of Onset  High blood pressure (Hypertension) Mother    Coronary Artery Disease (Blocked arteries around heart) Mother    High blood pressure (Hypertension) Father    High blood pressure (Hypertension) Sister        Social History   Tobacco Use Smoking Status Never Smokeless Tobacco Never     Social History Social History    Socioeconomic History  Marital status: Unknown Tobacco Use  Smoking status: Never  Smokeless tobacco: Never Substance and Sexual Activity  Alcohol use: Never  Drug use: Never      Objective:     Vitals:   11/14/21 1037 Pulse: 84 Temp: 37.1 C (98.8 F) SpO2: 98% Weight: 58.3 kg (128 lb 9.6  oz) Height: 152.4 cm (5')   Body mass index is 25.12 kg/m.   Physical Exam Vitals reviewed.  Constitutional:      General: She is not in acute distress.    Appearance: Normal appearance.  HENT:     Head: Normocephalic and atraumatic.  Eyes:     General: No scleral icterus.    Conjunctiva/sclera: Conjunctivae normal.  Pulmonary:     Comments: Port in place right upper chest wall, well-healed incision, no overlying erythema. Abdominal:     General: There is no distension.     Palpations: Abdomen is soft.     Tenderness: There is no abdominal tenderness.     Comments: Well-healed midline laparotomy scar.   Musculoskeletal:        General: No swelling or deformity. Normal range of motion.     Cervical back: Normal range of motion.  Skin:    General: Skin is warm and dry.     Coloration: Skin is not jaundiced.  Neurological:     General: No focal deficit present.     Mental Status: She is alert and oriented to person, place, and time.  Psychiatric:        Mood and Affect: Mood normal.        Behavior: Behavior normal.        Thought Content: Thought content normal.            Assessment and Plan:    Diagnoses and all orders for this visit:   Port-A-Cath in place   History of pancreatic cancer     60 yo female with a history of pancreatic cancer s/p neoadjuvant chemotherapy and Whipple in March 2022, with a complete pathologic response. She has completed treatment and recent surveillance imaging shows no evidence of disease. She would like to have her port removed, and understands if she were to have a recurrence it would need to be replaced. I discussed the procedure details and she agrees to proceed. I will schedule her for port removal at a time that is convenient to her work schedule. She will be contacted to schedule a surgery date.   Michaelle Birks, [facility] General, Hepatobiliary and Pancreatic Surgery 11/14/21 12:05 PM

## 2021-11-14 NOTE — H&P (Signed)
History of Present Illness: Jacqueline Tyler is a 60 y.o. female who is seen today to discuss port removal. 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 She underwent placement of a right subclavian portacath by me on 09/08/20, and subsequently underwent treatment with neoadjuvant FOLFIRINOX. Restaging scans showed a good response of the primary tumor to chemotherapy, and she underwent a Whipple by Dr. Crisoforo Oxford at [facility] in March 2022. Surgical pathology showed no residual carcinoma, and adjuvant chemotherapy was not recommended. She follows with Dr. Burr Medico and has been undergoing close surveillance. Her most recent scans on 10/23/21 showed no evidence of recurrent or metastatic disease. She would like to have her port removed and presents today to discuss surgery. She says the port bothers her and causes pain in certain positions. She has it flushed every 2 months and says there have been no issues flushing or drawing back blood from the port.     Review of Systems: A complete review of systems was obtained from the patient.  I have reviewed this information and discussed as appropriate with the patient.  See HPI as well for other ROS.     Medical History: Past Medical History Past Medical History: Diagnosis Date  Anxiety    History of cancer    History of stroke    Hypertension        Patient Active Problem List Diagnosis  Port-A-Cath in place  History of pancreatic cancer     Past Surgical History Past Surgical History: Procedure Laterality Date  PANCREATICODUODENECTOMY W/PANCREATICOJEJUNOSTOMY WHIPPLE      Portacath placement          Allergies Allergies Allergen Reactions  Oxycodone-Acetaminophen Nausea     Patient was unsure / doesn't recall taking     Poison Ivy Extract Hives     Patient states HIGHLY allergice Patient states HIGHLY allergice    Tyloxapol Other (See Comments)     Nausea   Nausea  Nausea         Current Outpatient Medications on File Prior to Visit Medication Sig Dispense Refill  hydroCHLOROthiazide (MICROZIDE) 12.5 mg capsule Take 1 capsule by mouth once daily      potassium chloride (KLOR-CON) 10 MEQ ER tablet Take 1 tablet by mouth once daily      citalopram (CELEXA) 40 MG tablet Take 40 mg by mouth every evening      cloNIDine HCL (CATAPRES) 0.1 MG tablet Take 0.1 mg by mouth at bedtime      simvastatin (ZOCOR) 80 MG tablet Take 80 mg by mouth at bedtime       No current facility-administered medications on file prior to visit.     Family History Family History Problem Relation Age of Onset  High blood pressure (Hypertension) Mother    Coronary Artery Disease (Blocked arteries around heart) Mother    High blood pressure (Hypertension) Father    High blood pressure (Hypertension) Sister        Social History   Tobacco Use Smoking Status Never Smokeless Tobacco Never     Social History Social History    Socioeconomic History  Marital status: Unknown Tobacco Use  Smoking status: Never  Smokeless tobacco: Never Substance and Sexual Activity  Alcohol use: Never  Drug use: Never      Objective:     Vitals:   11/14/21 1037 Pulse: 84 Temp: 37.1 C (98.8 F) SpO2: 98% Weight: 58.3 kg (128 lb 9.6  oz) Height: 152.4 cm (5')   Body mass index is 25.12 kg/m.   Physical Exam Vitals reviewed.  Constitutional:      General: She is not in acute distress.    Appearance: Normal appearance.  HENT:     Head: Normocephalic and atraumatic.  Eyes:     General: No scleral icterus.    Conjunctiva/sclera: Conjunctivae normal.  Pulmonary:     Comments: Port in place right upper chest wall, well-healed incision, no overlying erythema. Abdominal:     General: There is no distension.     Palpations: Abdomen is soft.     Tenderness: There is no abdominal tenderness.     Comments: Well-healed midline laparotomy scar.   Musculoskeletal:        General: No swelling or deformity. Normal range of motion.     Cervical back: Normal range of motion.  Skin:    General: Skin is warm and dry.     Coloration: Skin is not jaundiced.  Neurological:     General: No focal deficit present.     Mental Status: She is alert and oriented to person, place, and time.  Psychiatric:        Mood and Affect: Mood normal.        Behavior: Behavior normal.        Thought Content: Thought content normal.            Assessment and Plan:    Diagnoses and all orders for this visit:   Port-A-Cath in place   History of pancreatic cancer     60 yo female with a history of pancreatic cancer s/p neoadjuvant chemotherapy and Whipple in March 2022, with a complete pathologic response. She has completed treatment and recent surveillance imaging shows no evidence of disease. She would like to have her port removed, and understands if she were to have a recurrence it would need to be replaced. I discussed the procedure details and she agrees to proceed. I will schedule her for port removal at a time that is convenient to her work schedule. She will be contacted to schedule a surgery date.   Michaelle Birks, [facility], Hepatobiliary and Pancreatic Surgery 11/14/21 12:05 PM

## 2021-12-01 IMAGING — CT CT ABD-PELV W/ CM
3 of 10 series · 12 of 46 positions shown, 18 images · IV contrast (omnipaque)
Comparison: CT abdomen 12/02/2020

CLINICAL DATA: Pancreatic cancer, prior chemotherapy, prior Whipple
surgery. Restaging assessment.

EXAM:
CT ABDOMEN AND PELVIS WITH CONTRAST
TECHNIQUE: Multidetector CT imaging of the abdomen and pelvis was performed
using the standard protocol following bolus administration of
intravenous contrast.
CONTRAST:  75mL OMNIPAQUE IOHEXOL 350 MG/ML SOLN

[Series 5: coronal arterial · coronal · arterial · 0.58mm/px · 3 of 84 slices shown, 4 images]
[im 21/84  soft-tissue]
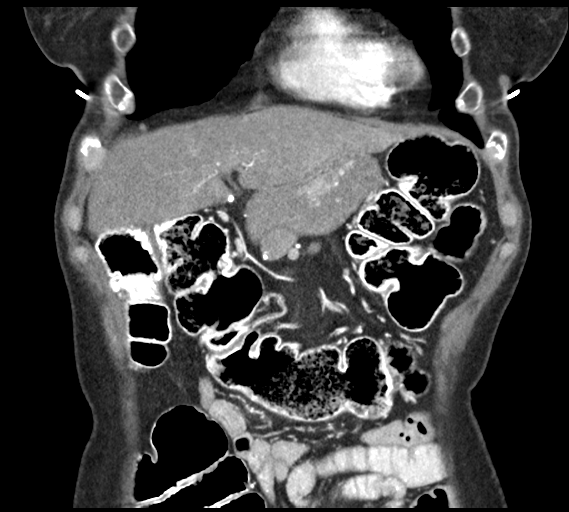
[im 42/84  soft-tissue]
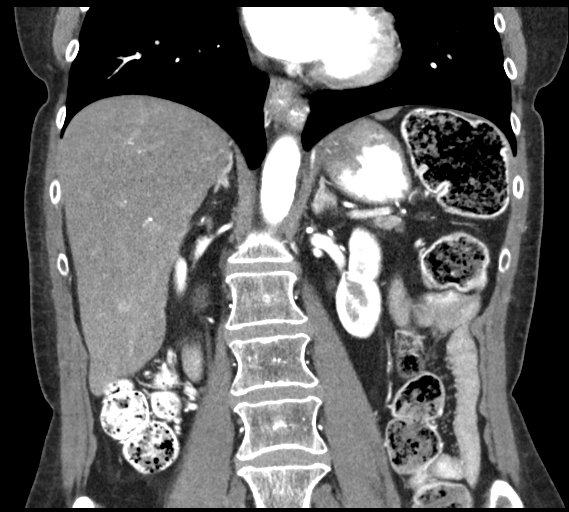
[im 42/84  bone]
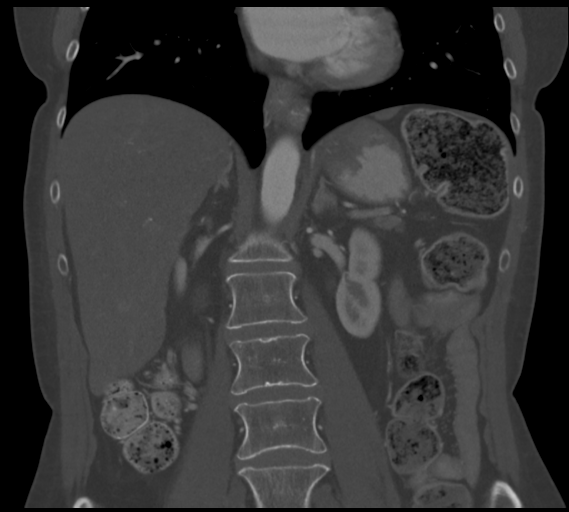
[im 63/84  soft-tissue]
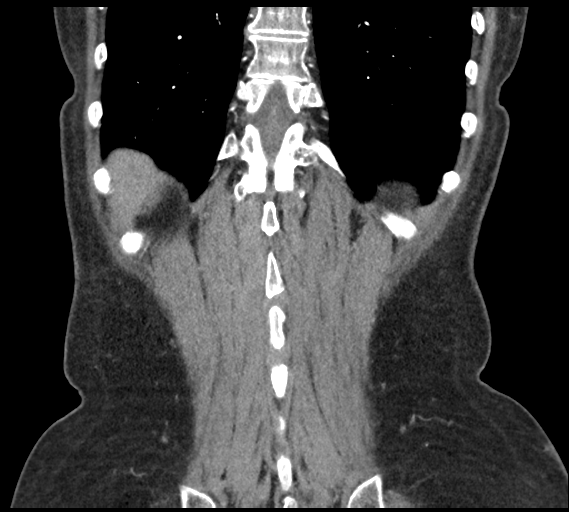

[Series 7: axial venous · axial · portal-venous · 0.72mm/px · z∈[+933,+1293]mm · 7 of 162 slices shown, 12 images]
[im 21/162  soft-tissue]
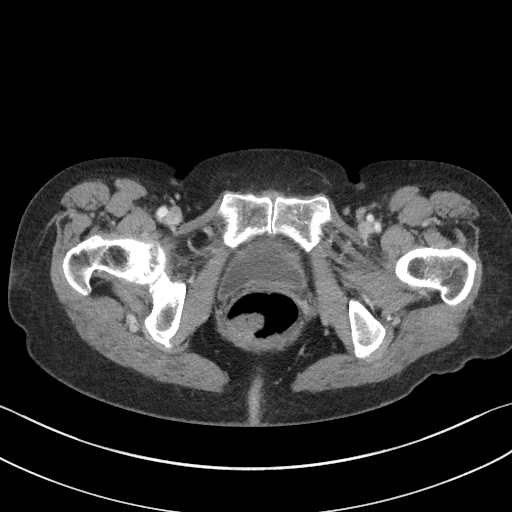
[im 21/162  bone]
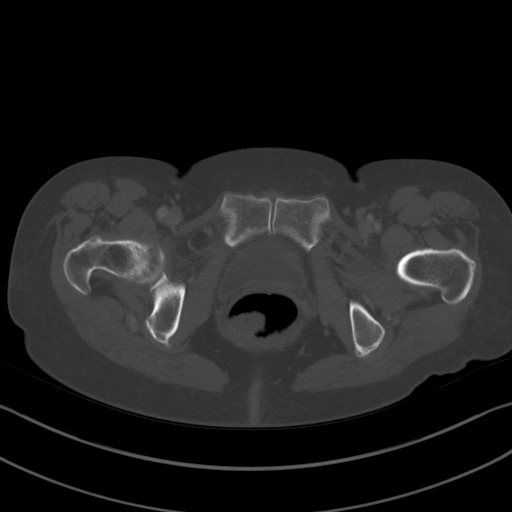
[im 41/162  soft-tissue]
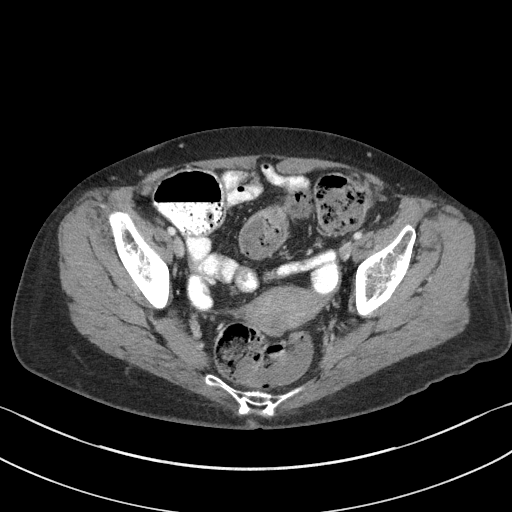
[im 61/162  soft-tissue]
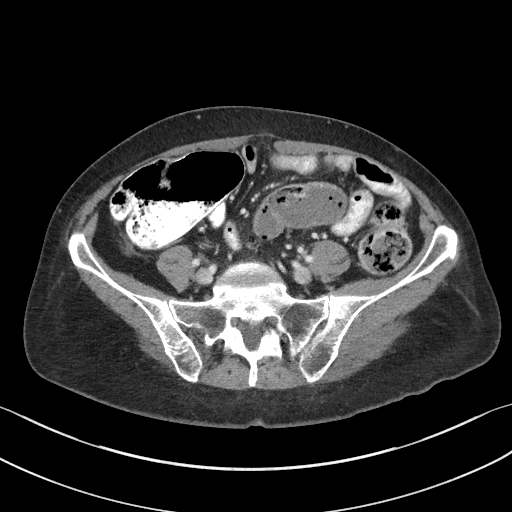
[im 81/162  soft-tissue]
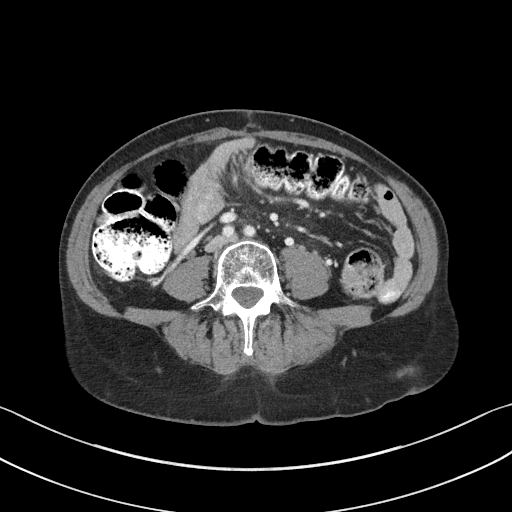
[im 81/162  lung]
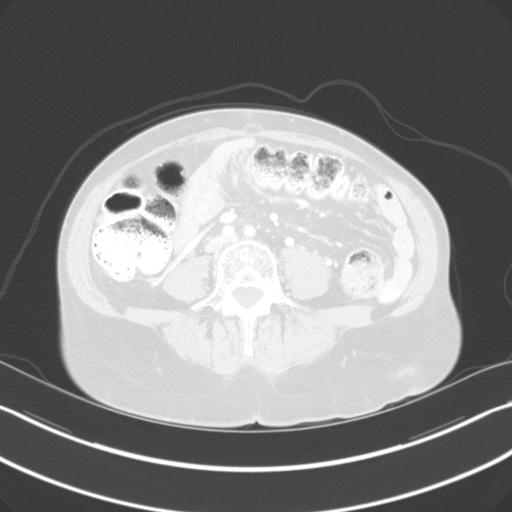
[im 101/162  soft-tissue]
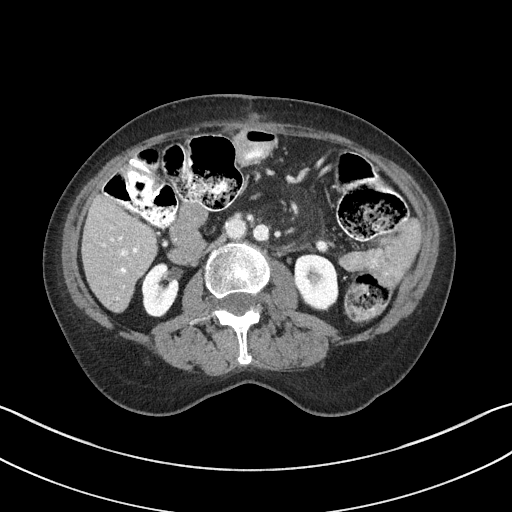
[im 101/162  lung]
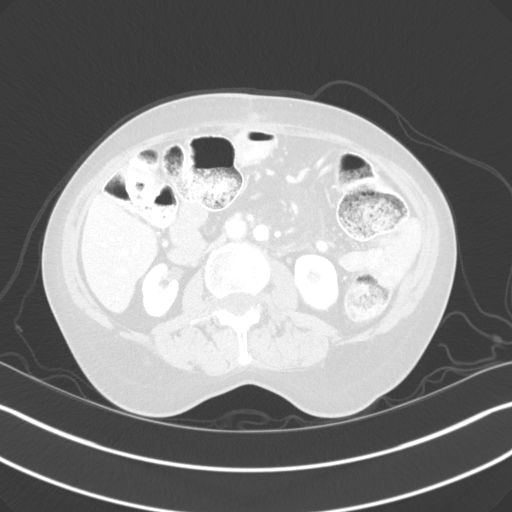
[im 121/162  soft-tissue]
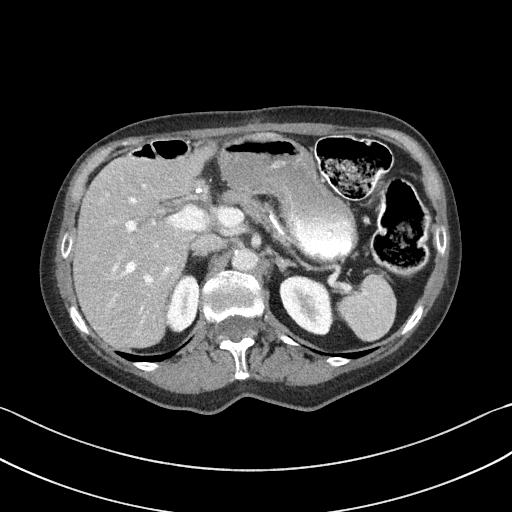
[im 121/162  lung]
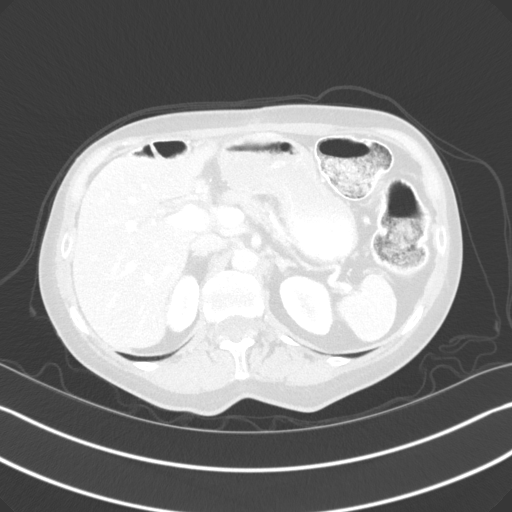
[im 141/162  soft-tissue]
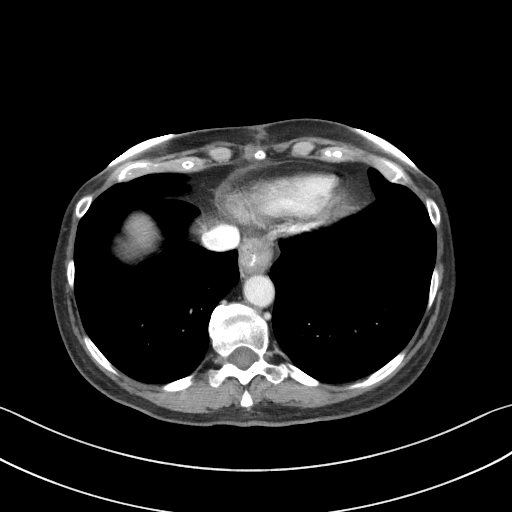
[im 141/162  lung]
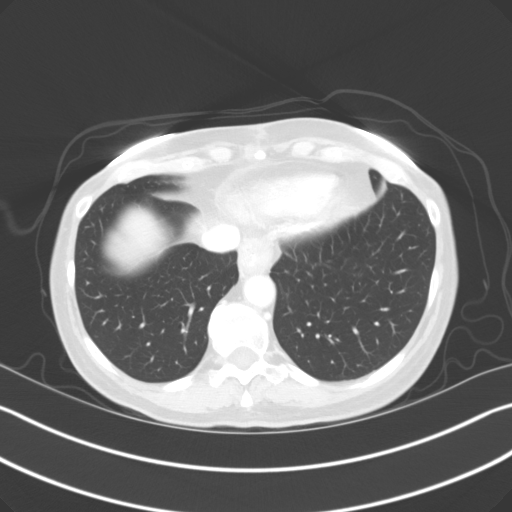

[Series 9: portal thin · axial · portal-venous · 0.72mm/px · z∈[+1122,+1160]mm · 2 of 137 slices shown]
[im 20/137  soft-tissue]
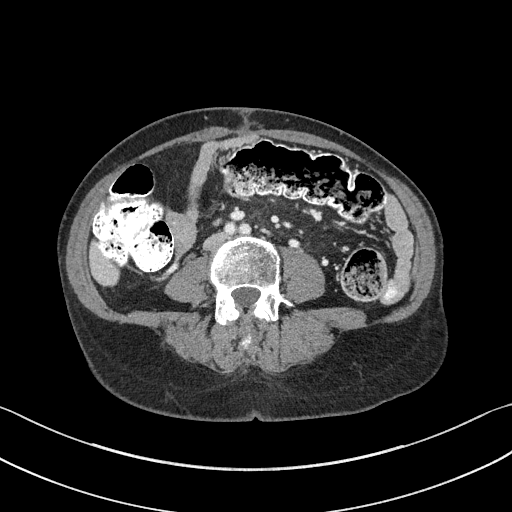
[im 39/137  soft-tissue]
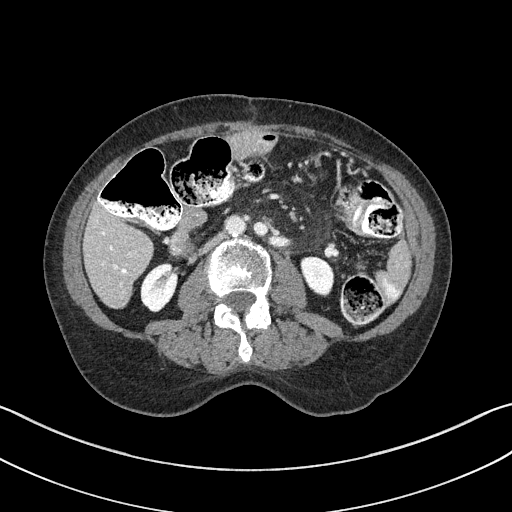

[12 of 46 positions shown; findings below may reference images not displayed]

FINDINGS: Lower chest: Small type 1 hiatal hernia. Mild distal esophageal wall
thickening, esophagitis would be a common cause. Fat density in the
inferior-apical-septal wall of the left ventricle, cannot exclude
remote myocardial infarction. 2 mm calcified nodule in the posterior
basal segment left lower lobe on image 8 series 7, benign.

Hepatobiliary: Cholecystectomy. No biliary dilatation,
choledochoenterostomy presumed patent. Otherwise unremarkable.

Pancreas: Prior Whipple procedure. 3.5 cm stent in the dorsal
pancreatic duct with a 1.5 mm lucency in the central stent on image
43 of series 7. No dorsal pancreatic duct dilatation,
pancreaticoenterostomy likely patent. No findings of pancreatitis.
No locally recurrent mass identified.

Spleen: Unremarkable

Adrenals/Urinary Tract: Unremarkable

Stomach/Bowel: Partial gastrectomy with patent gastrojejunostomy.
Prominent stool throughout the colon favors constipation. There is
some swirling of the mesentery but no bowel volvulus identified.

Vascular/Lymphatic: Aortoiliac atherosclerotic vascular disease. No
pathologic adenopathy.

Reproductive: Lobulated hypodensity along the right posterior uterus
suspicious for fibroids. Otherwise unremarkable.

Other: No complications along the laparotomy site. Low-grade central
mesenteric edema likely from mild sclerosing mesenteritis, similar
to prior.

Musculoskeletal: Unremarkable
IMPRESSION: 1. No current findings of recurrent malignancy. Interval Whipple
procedure with expected postoperative findings.
2. A 3.5 cm stent is present in the dorsal pancreatic duct. No duct
dilatation. There is an approximately 1.5 mm lucency centrally along
this stent shown on image 43 series 7, possibilities include stent
fracture, two separate stents, or an intentional radial lucency in
this type of stent.
3. Small type 1 hiatal hernia. Mild distal esophageal wall
thickening may reflect low-grade esophagitis.
4.  Aortic Atherosclerosis (03BV7-XB1.1).
5.  Prominent stool throughout the colon favors constipation.
6. Uterine fibroid.
7. Low-grade mesenteric edema likely from mild sclerosing
mesenteritis and similar to prior.

## 2021-12-06 ENCOUNTER — Other Ambulatory Visit: Payer: BC Managed Care – PPO

## 2021-12-06 ENCOUNTER — Encounter: Payer: Self-pay | Admitting: Hematology

## 2021-12-06 ENCOUNTER — Ambulatory Visit: Payer: BC Managed Care – PPO | Admitting: Hematology

## 2021-12-06 ENCOUNTER — Inpatient Hospital Stay: Payer: BC Managed Care – PPO | Attending: Hematology | Admitting: Hematology

## 2021-12-06 ENCOUNTER — Inpatient Hospital Stay: Payer: BC Managed Care – PPO

## 2021-12-06 ENCOUNTER — Other Ambulatory Visit: Payer: Self-pay

## 2021-12-06 VITALS — BP 115/73 | HR 63 | Temp 98.4°F | Resp 18 | Ht 62.0 in | Wt 132.4 lb

## 2021-12-06 DIAGNOSIS — R590 Localized enlarged lymph nodes: Secondary | ICD-10-CM | POA: Insufficient documentation

## 2021-12-06 DIAGNOSIS — K760 Fatty (change of) liver, not elsewhere classified: Secondary | ICD-10-CM | POA: Insufficient documentation

## 2021-12-06 DIAGNOSIS — R609 Edema, unspecified: Secondary | ICD-10-CM | POA: Insufficient documentation

## 2021-12-06 DIAGNOSIS — I10 Essential (primary) hypertension: Secondary | ICD-10-CM | POA: Diagnosis not present

## 2021-12-06 DIAGNOSIS — I700 Atherosclerosis of aorta: Secondary | ICD-10-CM | POA: Diagnosis not present

## 2021-12-06 DIAGNOSIS — D259 Leiomyoma of uterus, unspecified: Secondary | ICD-10-CM | POA: Diagnosis not present

## 2021-12-06 DIAGNOSIS — C250 Malignant neoplasm of head of pancreas: Secondary | ICD-10-CM | POA: Insufficient documentation

## 2021-12-06 DIAGNOSIS — E119 Type 2 diabetes mellitus without complications: Secondary | ICD-10-CM | POA: Diagnosis not present

## 2021-12-06 DIAGNOSIS — K449 Diaphragmatic hernia without obstruction or gangrene: Secondary | ICD-10-CM | POA: Diagnosis not present

## 2021-12-06 DIAGNOSIS — Z95828 Presence of other vascular implants and grafts: Secondary | ICD-10-CM

## 2021-12-06 DIAGNOSIS — K861 Other chronic pancreatitis: Secondary | ICD-10-CM | POA: Insufficient documentation

## 2021-12-06 DIAGNOSIS — Z79899 Other long term (current) drug therapy: Secondary | ICD-10-CM | POA: Insufficient documentation

## 2021-12-06 LAB — CBC WITH DIFFERENTIAL (CANCER CENTER ONLY)
Abs Immature Granulocytes: 0.02 10*3/uL (ref 0.00–0.07)
Basophils Absolute: 0 10*3/uL (ref 0.0–0.1)
Basophils Relative: 0 %
Eosinophils Absolute: 0.1 10*3/uL (ref 0.0–0.5)
Eosinophils Relative: 2 %
HCT: 35.4 % — ABNORMAL LOW (ref 36.0–46.0)
Hemoglobin: 11.4 g/dL — ABNORMAL LOW (ref 12.0–15.0)
Immature Granulocytes: 0 %
Lymphocytes Relative: 35 %
Lymphs Abs: 2.7 10*3/uL (ref 0.7–4.0)
MCH: 27.4 pg (ref 26.0–34.0)
MCHC: 32.2 g/dL (ref 30.0–36.0)
MCV: 85.1 fL (ref 80.0–100.0)
Monocytes Absolute: 0.8 10*3/uL (ref 0.1–1.0)
Monocytes Relative: 11 %
Neutro Abs: 4.1 10*3/uL (ref 1.7–7.7)
Neutrophils Relative %: 52 %
Platelet Count: 285 10*3/uL (ref 150–400)
RBC: 4.16 MIL/uL (ref 3.87–5.11)
RDW: 14.3 % (ref 11.5–15.5)
WBC Count: 7.8 10*3/uL (ref 4.0–10.5)
nRBC: 0 % (ref 0.0–0.2)

## 2021-12-06 LAB — CMP (CANCER CENTER ONLY)
ALT: 36 U/L (ref 0–44)
AST: 38 U/L (ref 15–41)
Albumin: 3.7 g/dL (ref 3.5–5.0)
Alkaline Phosphatase: 114 U/L (ref 38–126)
Anion gap: 6 (ref 5–15)
BUN: 8 mg/dL (ref 8–23)
CO2: 30 mmol/L (ref 22–32)
Calcium: 8.4 mg/dL — ABNORMAL LOW (ref 8.9–10.3)
Chloride: 103 mmol/L (ref 98–111)
Creatinine: 0.9 mg/dL (ref 0.44–1.00)
GFR, Estimated: >60 mL/min (ref >60)
Glucose, Bld: 97 mg/dL (ref 70–99)
Potassium: 3 mmol/L — ABNORMAL LOW (ref 3.5–5.1)
Sodium: 139 mmol/L (ref 135–145)
Total Bilirubin: 0.7 mg/dL (ref 0.3–1.2)
Total Protein: 6.6 g/dL (ref 6.5–8.1)

## 2021-12-06 MED ORDER — SODIUM CHLORIDE 0.9% FLUSH
10.0000 mL | INTRAVENOUS | Status: DC | PRN
  Administered 2021-12-06: 10 mL

## 2021-12-06 MED ORDER — HEPARIN SOD (PORK) LOCK FLUSH 100 UNIT/ML IV SOLN
500.0000 [IU] | Freq: Once | INTRAVENOUS | Status: AC | PRN
  Administered 2021-12-06: 500 [IU]

## 2021-12-06 NOTE — Progress Notes (Signed)
[place]   Telephone:([phone]) [phone] [phone]) [phone]   Clinic Follow up Note   Patient Care Team: Nicola Girt, DO as PCP - General (Internal Medicine) Truitt Merle, MD as Consulting Physician (Oncology) Dwan Bolt, MD as Consulting Physician (General Surgery)  Date of Service:  12/06/2021  CHIEF COMPLAINT: f/u of pancreatic cancer  CURRENT THERAPY:  Surveillance  ASSESSMENT & PLAN:  Jacqueline Tyler is a 61 y.o. female with   1. 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 staging was negative for metastatic disease.  -Given the vascular invasion, this was borderline resectable. She completed neoadjuvant chemo FOLFIRINOX q2 weeks 09/12/20 - 12/29/20 to downstage her disease.  -She proceeded with whipple surgery by Dr Crisoforo Oxford on 02/08/21. Her surgical path showed no residual disease. She overall had complete response. Adjuvant chemo was not recommended  -due to the high recurrence risk of pancreatic cancer, she is under close surveillance.  -Surveillance CT AP 10/23/21 was NED. We will plan for repeat in 6 months.   2. HTN -meds adjusted during chemo -Continue to F/u with PCP   PLAN: -port removal 12/08/21 -lab and f/u in 2.5 months -plan for surveillance CT in 04/2022, will order on next visit    No problem-specific Assessment & Plan notes found for this encounter.   SUMMARY OF ONCOLOGIC HISTORY: Oncology History Overview Note  Cancer Staging Pancreatic cancer [facility]) Staging form: Exocrine Pancreas, AJCC 8th Edition - Clinical stage from 08/24/2020: Stage IIB (cT2, cN1, cM0) - Signed by Truitt Merle, MD on 08/30/2020 Stage prefix: Initial diagnosis    Pancreatic cancer ([place])  08/20/2020 Imaging    CT AP 08/20/20  IMPRESSION: 1. 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 Collateral circulation is noted. There is moderate intrahepatic and extrahepatic biliary dilatation which appears to be due to the pancreatic head mass. Pancreatic ductal dilatation is noted as well. 2. Probable 2.7 cm uterine fibroid. 3. Aortic atherosclerosis.   Aortic Atherosclerosis (ICD10-I70.0).   08/20/2020 Tumor Marker   Ca19-9 - 927   08/22/2020 Procedure   ERCP by Dr Watt Climes 08/22/20  IMPRESSION - The major papilla appeared normal. - A biliary sphincterotomy was performed. - Cells for cytology obtained in the lower third and middle of the main duct. - One plastic stent was placed into the common bile duct.   FINAL MICROSCOPIC DIAGNOSIS:  - No malignant cells identified  - Benign reactive/reparative changes   08/24/2020 Cancer Staging   Staging form: Exocrine Pancreas, AJCC 8th Edition - Clinical stage from 08/24/2020: Stage IIB (cT2, cN1, cM0) - Signed by Truitt Merle, MD on 08/30/2020    08/24/2020 Procedure   EUS by Dr Paulita Fujita 08/24/20  IMPRESSION - There was no sign of significant pathology in the ampulla. - A few malignant-appearing lymph nodes were visualized in the peripancreatic region and porta hepatis region. - One stent was visualized endosonographically in the common bile duct. - A mass was identified in the pancreatic head. Tissue was obtained from this exam. The preliminary diagnosis is consistent with adenocarcinoma. Invasion into SMV/PV seen. Lymphadenopathy noted. This was  staged T3 N1 Mx by endosonographic criteria. Fine needle aspiration performed.   08/24/2020 Initial Biopsy   FINAL MICROSCOPIC DIAGNOSIS: 08/24/20 - Malignant cells consistent with adenocarcinoma    08/30/2020 Initial Diagnosis    Pancreatic cancer ([place])   09/05/2020 Imaging   CT Chest  IMPRESSION: 1. Stable 2.7 cm infiltrating pancreatic head mass with borderline enlarged peripancreatic lymph nodes. 2. No findings for pulmonary metastatic disease. 3. Small hiatal hernia. 4. Aortic atherosclerosis.   Aortic Atherosclerosis (ICD10-I70.0).   09/08/2020 Procedure   INSERTION PORT-A-CATH by Dr Zenia Resides and Barry Dienes    09/12/2020 - 12/29/2020 Chemotherapy   Neoadjuvant FOLFIRINOX q2 weeks starting 09/12/20-12/29/20    12/02/2020 Imaging   CT CAP  IMPRESSION: 1. Previously noted mass of the central pancreatic head is almost entirely resolved, difficult to discretely appreciate on current examination. Findings are consistent with treatment response. There remains obstruction of the pancreatic duct near the head neck junction with mild prominence of the pancreatic duct, measuring up to 5 mm, with atrophy of the distal pancreatic parenchyma. 2. The portal vein, splenic vein, and superior mesenteric vein are now widely patent, previously effaced at the confluence by mass effect. 3. Interval placement of common bile duct stent, tip positioned in the distal duodenum, with relief of previously seen biliary ductal dilatation. 4. 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 Following the bifurcation of the celiac mesenteric trunk, the celiac axis appears to directly traverse the vicinity of the mass, although there does appear to be a fat plane about the vessel. 5. The distal small bowel and colon are diffusely somewhat hyperenhancing and inflamed appearing with vascular combing and a tethered appearance of the distal small bowel. This appearance generally suggests inflammatory bowel disease such as Crohn's disease. Correlate with referable clinical history, if  present. No evidence of obstruction or other acute complication. 6. Hepatic steatosis. 7. Aortic atherosclerosis.   12/28/2020 Genetic Testing   Negative hereditary cancer genetic testing: no pathogenic variants detected in [employer] Common Hereditary Cancers Panel.  The report date is December 28, 2020.    The Common Hereditary Cancers Panel offered by [employer] includes sequencing and/or deletion duplication testing of the following 47 genes: APC, ATM, AXIN2, BARD1, BMPR1A, BRCA1, BRCA2, BRIP1, CDH1, CDK4, CDKN2A (p14ARF), CDKN2A (p16INK4a), CHEK2, CTNNA1, DICER1, EPCAM (Deletion/duplication testing only), GREM1 (promoter region deletion/duplication testing only), GREM1, HOXB13, KIT, MEN1, MLH1, MSH2, MSH3, MSH6, MUTYH, NBN, NF1, NHTL1, PALB2, PDGFRA, PMS2, POLD1, POLE, PTEN, RAD50, RAD51C, RAD51D, SDHA, SDHB, SDHC, SDHD, SMAD4, SMARCA4. STK11, TP53, TSC1, TSC2, and VHL.  The following genes were evaluated for sequence changes only: SDHA and HOXB13 c.251G>A variant only.   01/17/2021 Imaging   CT CAP from [facility] IMPRESSION Small lesion in the pancreatic head measuring approximately 1.1 x 0.8 cm without evidence of vascular involvement or metastasis consistent with previously described, biopsy-proven pancreatic adenocarcinoma.   02/08/2021 Surgery   Whipple Surgery with Dr Jyl Heinz  Final Pathologic Diagnosis      A.  GALLBLADDER, CHOLECYSTECTOMY: Chronic cholecystitis. No malignancy identified.   B.  BILE DUCT STENT, REMOVAL (GROSS ONLY DIAGNOSIS): Stent, see gross description.   C.  WHIPPLE RESECTION: No residual malignancy identified. Chronic and acute inflammation with granulation tissue reaction, fibrosis and features of chronic pancreatitis, suggestive of therapy effect. Fourteen lymph nodes, negative for metastasis (0/14). Margins negative for malignancy.  06/07/2021 Imaging   CT AP  IMPRESSION: 1. No current findings of recurrent malignancy. Interval Whipple procedure with  expected postoperative findings. 2. A 3.5 cm stent is present in the dorsal pancreatic duct. No duct dilatation. There is an approximately 1.5 mm lucency centrally along this stent shown on image 43 series 7, possibilities include stent fracture, two separate stents, or an intentional radial lucency in this type of stent. 3. Small type 1 hiatal hernia. Mild distal esophageal wall thickening may reflect low-grade esophagitis. 4.  Aortic Atherosclerosis (ICD10-I70.0). 5.  Prominent stool throughout the colon favors constipation. 6. Uterine fibroid. 7. 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   Port-A-Cath in place     INTERVAL HISTORY:  JERNEE MURTAUGH is here for a follow up of pancreatic cancer. She was last seen by NP Lacie on 10/05/21. She presents to the clinic accompanied by her husband.   All other systems were reviewed with the patient and are negative.  MEDICAL HISTORY:  Past Medical History:  Diagnosis Date   Anxiety    Cancer ([place]) 07/2020   pancreatic cancer   Depression    Diabetes mellitus without complication ([place]) 16/1096   pancreatic cancer   High cholesterol    Hypertension     SURGICAL HISTORY: Past Surgical History:  Procedure Laterality Date   BILIARY BRUSHING  08/22/2020   Procedure: BILIARY BRUSHING;  Surgeon: Clarene Essex, MD;  Location: [facility];  Service: Endoscopy;;   BILIARY STENT PLACEMENT N/A 08/22/2020   Procedure: BILIARY STENT PLACEMENT;  Surgeon: Clarene Essex, MD;  Location: [facility];  Service: Endoscopy;  Laterality: N/A;   ENDOSCOPIC RETROGRADE CHOLANGIOPANCREATOGRAPHY (ERCP) WITH PROPOFOL N/A 08/22/2020   Procedure: ENDOSCOPIC RETROGRADE CHOLANGIOPANCREATOGRAPHY (ERCP) WITH PROPOFOL;  Surgeon: Clarene Essex, MD;  Location: [facility];  Service: Endoscopy;  Laterality: N/A;   ESOPHAGOGASTRODUODENOSCOPY (EGD) WITH PROPOFOL N/A 08/24/2020   Procedure: ESOPHAGOGASTRODUODENOSCOPY (EGD) WITH  PROPOFOL;  Surgeon: Arta Silence, MD;  Location: [facility];  Service: Endoscopy;  Laterality: N/A;   FINE NEEDLE ASPIRATION N/A 08/24/2020   Procedure: FINE NEEDLE ASPIRATION (FNA) LINEAR;  Surgeon: Arta Silence, MD;  Location: [facility];  Service: Endoscopy;  Laterality: N/A;   PORTACATH PLACEMENT Right 09/08/2020   Procedure: INSERTION PORT-A-CATH;  Surgeon: Dwan Bolt, MD;  Location: [place];  Service: General;  Laterality: Right;   SPHINCTEROTOMY  08/22/2020   Procedure: SPHINCTEROTOMY;  Surgeon: Clarene Essex, MD;  Location: [facility];  Service: Endoscopy;;   UPPER ESOPHAGEAL ENDOSCOPIC ULTRASOUND (EUS) N/A 08/24/2020   Procedure: UPPER ESOPHAGEAL ENDOSCOPIC ULTRASOUND (EUS);  Surgeon: Arta Silence, MD;  Location: [facility];  Service: Endoscopy;  Laterality: N/A;    I have reviewed the social history and family history with the patient and they are unchanged from previous note.  ALLERGIES:  is allergic to oxycodone-acetaminophen, poison ivy extract, and tyloxapol.  MEDICATIONS:  Current Outpatient Medications  Medication Sig Dispense Refill   acetaminophen (TYLENOL) 325 MG tablet Take 650 mg by mouth every 6 (six) hours as needed (pain).     citalopram (CELEXA) 40 MG tablet Take 40 mg by mouth every evening.     cloNIDine (CATAPRES) 0.1 MG tablet Take 0.1 mg by mouth at bedtime.     CREON 36000-114000 units CPEP capsule TAKE 2 CAPSULES BY MOUTH 3 TIMES A DAY WITH A MEAL. MAY ALSO TAKE 1 CAPSULE AS NEEDED WITH SNACKS (Patient taking differently: 36,000 Units 3 (three) times daily with meals.) 240 capsule 2  hydrochlorothiazide (MICROZIDE) 12.5 MG capsule Take 12.5 mg by mouth in the morning.     magnesium oxide (MAG-OX) 400 (241.3 Mg) MG tablet Take 1 tablet (400 mg total) by mouth 2 (two) times daily. (Patient taking differently: Take 400 mg by mouth every evening.) 60 tablet 0   potassium chloride (KLOR-CON) 10 MEQ tablet TAKE 1 TABLET BY MOUTH  EVERY DAY (Patient taking differently: 10 mEq every evening.) 90 tablet 1   simvastatin (ZOCOR) 80 MG tablet Take 80 mg by mouth every evening.     No current facility-administered medications for this visit.    PHYSICAL EXAMINATION: ECOG PERFORMANCE STATUS: 1 - Symptomatic but completely ambulatory  Vitals:   12/06/21 1410  BP: 115/73  Pulse: 63  Resp: 18  Temp: 98.4 F (36.9 C)  SpO2: 100%   Wt Readings from Last 3 Encounters:  12/06/21 132 lb 6.4 oz (60.1 kg)  10/05/21 123 lb 14.4 oz (56.2 kg)  08/09/21 123 lb 9.6 oz (56.1 kg)     GENERAL:alert, no distress and comfortable SKIN: skin color normal, no rashes or significant lesions EYES: normal, Conjunctiva are pink and non-injected, sclera clear  NEURO: alert & oriented x 3 with fluent speech  LABORATORY DATA:  I have reviewed the data as listed CBC Latest Ref Rng & Units 12/06/2021 10/05/2021 08/09/2021  WBC 4.0 - 10.5 K/uL 7.8 6.0 6.1  Hemoglobin 12.0 - 15.0 g/dL 11.4(L) 12.4 11.4(L)  Hematocrit 36.0 - 46.0 % 35.4(L) 38.6 36.0  Platelets 150 - 400 K/uL 285 252 270     CMP Latest Ref Rng & Units 12/06/2021 10/05/2021 08/09/2021  Glucose 70 - 99 mg/dL 97 94 96  BUN 8 - 23 mg/dL $Remove'8 18 12  'loNYtkg$ Creatinine 0.44 - 1.00 mg/dL 0.90 0.94 0.76  Sodium 135 - 145 mmol/L 139 139 142  Potassium 3.5 - 5.1 mmol/L 3.0(L) 4.0 3.6  Chloride 98 - 111 mmol/L 103 104 108  CO2 22 - 32 mmol/L $RemoveB'30 24 25  'NvCJFgkF$ Calcium 8.9 - 10.3 mg/dL 8.4(L) 9.0 9.2  Total Protein 6.5 - 8.1 g/dL 6.6 7.0 6.7  Total Bilirubin 0.3 - 1.2 mg/dL 0.7 1.1 1.2  Alkaline Phos 38 - 126 U/L 114 114 193(H)  AST 15 - 41 U/L 38 49(H) 170(H)  ALT 0 - 44 U/L 36 50(H) 115(H)      RADIOGRAPHIC STUDIES: I have personally reviewed the radiological images as listed and agreed with the findings in the report. No results found.    No orders of the defined types were placed in this encounter.  All questions were answered. The patient knows to call the clinic with any problems,  questions or concerns. No barriers to learning was detected. The total time spent in the appointment was 25 minutes.     Truitt Merle, MD 12/06/2021   I, Wilburn Mylar, am acting as scribe for Truitt Merle, MD.   I have reviewed the above documentation for accuracy and completeness, and I agree with the above.

## 2021-12-07 ENCOUNTER — Other Ambulatory Visit: Payer: BC Managed Care – PPO

## 2021-12-07 ENCOUNTER — Other Ambulatory Visit: Payer: Self-pay

## 2021-12-07 ENCOUNTER — Encounter (HOSPITAL_COMMUNITY): Payer: Self-pay | Admitting: Surgery

## 2021-12-07 ENCOUNTER — Ambulatory Visit: Payer: BC Managed Care – PPO | Admitting: Hematology

## 2021-12-07 LAB — CANCER ANTIGEN 19-9: CA 19-9: 3 U/mL (ref 0–35)

## 2021-12-07 NOTE — Progress Notes (Signed)
Jacqueline Tyler  denies chest pain or shortness of breath or chest pain .  Patient denies having any s/s of Covid in her household.  Patient denies any known exposure to Covid.  I instructed  Jacqueline Tyler to shower with antibiotic soap, if it is available.  Dry off with a clean towel. Do not put lotion, powder, cologne or deodorant or makeup.No jewelry or piercings. Men may shave their face and neck. Woman should not shave. No nail polish, artificial or acrylic nails. Wear clean clothes, brush your teeth. Glasses, contact lens,dentures or partials may not be worn in the OR. If you need to wear them, please bring a case for glasses, do not wear contacts or bring a case, the hospital does not have contact cases, dentures or partials will have to be removed , make sure they are clean, we will provide a denture cup to put them in. You will need some one to drive you home and a responsible person over the age of 18 to stay with you for the first 24 hours after surgery.   PCP Dr, Doug Sou.   Jacqueline Tyler had labs drawn on 12/06/21, her potassium was 3.0, patient said that she was not called or given any instruction. Patient is on Potassium 10 meq.

## 2021-12-08 ENCOUNTER — Ambulatory Visit (HOSPITAL_COMMUNITY): Payer: BC Managed Care – PPO | Admitting: Anesthesiology

## 2021-12-08 ENCOUNTER — Other Ambulatory Visit: Payer: Self-pay

## 2021-12-08 ENCOUNTER — Encounter (HOSPITAL_COMMUNITY): Payer: Self-pay | Admitting: Surgery

## 2021-12-08 ENCOUNTER — Encounter (HOSPITAL_COMMUNITY)
Admission: RE | Disposition: A | Discharge status: Home / Self Care | Payer: Self-pay | Source: Home / Self Care | Attending: Surgery

## 2021-12-08 ENCOUNTER — Ambulatory Visit (HOSPITAL_COMMUNITY)
Admission: RE | Admit: 2021-12-08 | Discharge: 2021-12-08 | Disposition: A | Discharge status: Home / Self Care | Payer: BC Managed Care – PPO | Attending: Surgery | Admitting: Surgery

## 2021-12-08 DIAGNOSIS — Z8507 Personal history of malignant neoplasm of pancreas: Secondary | ICD-10-CM | POA: Diagnosis not present

## 2021-12-08 DIAGNOSIS — I10 Essential (primary) hypertension: Secondary | ICD-10-CM | POA: Diagnosis not present

## 2021-12-08 DIAGNOSIS — Z8673 Personal history of transient ischemic attack (TIA), and cerebral infarction without residual deficits: Secondary | ICD-10-CM | POA: Diagnosis not present

## 2021-12-08 DIAGNOSIS — Z9221 Personal history of antineoplastic chemotherapy: Secondary | ICD-10-CM | POA: Diagnosis not present

## 2021-12-08 DIAGNOSIS — F32A Depression, unspecified: Secondary | ICD-10-CM | POA: Insufficient documentation

## 2021-12-08 DIAGNOSIS — Z79899 Other long term (current) drug therapy: Secondary | ICD-10-CM | POA: Insufficient documentation

## 2021-12-08 DIAGNOSIS — F419 Anxiety disorder, unspecified: Secondary | ICD-10-CM | POA: Diagnosis not present

## 2021-12-08 DIAGNOSIS — Z452 Encounter for adjustment and management of vascular access device: Secondary | ICD-10-CM | POA: Insufficient documentation

## 2021-12-08 DIAGNOSIS — E119 Type 2 diabetes mellitus without complications: Secondary | ICD-10-CM | POA: Diagnosis not present

## 2021-12-08 HISTORY — DX: Personal history of other medical treatment: Z92.89

## 2021-12-08 HISTORY — DX: Personal history of other diseases of the digestive system: Z87.19

## 2021-12-08 HISTORY — DX: Cerebral infarction, unspecified: I63.9

## 2021-12-08 HISTORY — PX: PORT-A-CATH REMOVAL: SHX5289

## 2021-12-08 LAB — GLUCOSE, CAPILLARY
Glucose-Capillary: 107 mg/dL — ABNORMAL HIGH (ref 70–99)
Glucose-Capillary: 96 mg/dL (ref 70–99)

## 2021-12-08 SURGERY — REMOVAL PORT-A-CATH
Anesthesia: Monitor Anesthesia Care

## 2021-12-08 MED ORDER — PROPOFOL 500 MG/50ML IV EMUL
INTRAVENOUS | Status: DC | PRN
  Administered 2021-12-08: 75 ug/kg/min via INTRAVENOUS

## 2021-12-08 MED ORDER — ONDANSETRON HCL 4 MG/2ML IJ SOLN
INTRAMUSCULAR | Status: DC | PRN
  Administered 2021-12-08: 4 mg via INTRAVENOUS

## 2021-12-08 MED ORDER — ONDANSETRON HCL 4 MG/2ML IJ SOLN: 4.0000 mg | Freq: Once | INTRAMUSCULAR | Status: DC | PRN

## 2021-12-08 MED ORDER — PHENYLEPHRINE HCL-NACL 20-0.9 MG/250ML-% IV SOLN
INTRAVENOUS | Status: DC | PRN
  Administered 2021-12-08: 10 ug/min via INTRAVENOUS

## 2021-12-08 MED ORDER — PHENYLEPHRINE 40 MCG/ML (10ML) SYRINGE FOR IV PUSH (FOR BLOOD PRESSURE SUPPORT)
PREFILLED_SYRINGE | INTRAVENOUS | Status: DC | PRN
Start: 2021-12-08 — End: 2021-12-08
  Administered 2021-12-08: 80 ug via INTRAVENOUS

## 2021-12-08 MED ORDER — ORAL CARE MOUTH RINSE: 15.0000 mL | Freq: Once | OROMUCOSAL | Status: DC

## 2021-12-08 MED ORDER — FENTANYL CITRATE (PF) 250 MCG/5ML IJ SOLN
INTRAMUSCULAR | Status: DC | PRN
  Administered 2021-12-08: 25 ug via INTRAVENOUS

## 2021-12-08 MED ORDER — MIDAZOLAM HCL 2 MG/2ML IJ SOLN
INTRAMUSCULAR | Status: AC
  Filled 2021-12-08: qty 2

## 2021-12-08 MED ORDER — LIDOCAINE HCL (PF) 1 % IJ SOLN
INTRAMUSCULAR | Status: AC
  Filled 2021-12-08: qty 30

## 2021-12-08 MED ORDER — CHLORHEXIDINE GLUCONATE 0.12 % MT SOLN
OROMUCOSAL | Status: AC
  Administered 2021-12-08: 15 mL
  Filled 2021-12-08: qty 15

## 2021-12-08 MED ORDER — CHLORHEXIDINE GLUCONATE 0.12 % MT SOLN: 15.0000 mL | Freq: Once | OROMUCOSAL | Status: DC

## 2021-12-08 MED ORDER — MIDAZOLAM HCL 2 MG/2ML IJ SOLN
INTRAMUSCULAR | Status: DC | PRN
Start: 2021-12-08 — End: 2021-12-08
  Administered 2021-12-08: 1 mg via INTRAVENOUS

## 2021-12-08 MED ORDER — SODIUM CHLORIDE 0.9 % IR SOLN
Status: DC | PRN
  Administered 2021-12-08: 1000 mL

## 2021-12-08 MED ORDER — FENTANYL CITRATE (PF) 250 MCG/5ML IJ SOLN
INTRAMUSCULAR | Status: AC
  Filled 2021-12-08: qty 5

## 2021-12-08 MED ORDER — PROPOFOL 10 MG/ML IV BOLUS
INTRAVENOUS | Status: AC
  Filled 2021-12-08: qty 20

## 2021-12-08 MED ORDER — LACTATED RINGERS IV SOLN: INTRAVENOUS | Status: DC

## 2021-12-08 MED ORDER — BUPIVACAINE-EPINEPHRINE 0.25% -1:200000 IJ SOLN
INTRAMUSCULAR | Status: DC | PRN
  Administered 2021-12-08: 10 mL

## 2021-12-08 MED ORDER — FENTANYL CITRATE (PF) 100 MCG/2ML IJ SOLN: 25.0000 ug | INTRAMUSCULAR | Status: DC | PRN

## 2021-12-08 MED ORDER — BUPIVACAINE-EPINEPHRINE (PF) 0.25% -1:200000 IJ SOLN
INTRAMUSCULAR | Status: AC
  Filled 2021-12-08: qty 30

## 2021-12-08 MED ORDER — CEFAZOLIN SODIUM-DEXTROSE 2-4 GM/100ML-% IV SOLN
2.0000 g | INTRAVENOUS | Status: AC
  Administered 2021-12-08: 2 g via INTRAVENOUS
  Filled 2021-12-08: qty 100

## 2021-12-08 SURGICAL SUPPLY — 31 items
ADH SKN CLS APL DERMABOND .7 (GAUZE/BANDAGES/DRESSINGS) ×1
BAG COUNTER SPONGE SURGICOUNT (BAG) ×2 IMPLANT
BAG SPNG CNTER NS LX DISP (BAG) ×1
CHLORAPREP W/TINT 10.5 ML (MISCELLANEOUS) ×2 IMPLANT
COVER SURGICAL LIGHT HANDLE (MISCELLANEOUS) ×2 IMPLANT
DECANTER SPIKE VIAL GLASS SM (MISCELLANEOUS) ×4 IMPLANT
DERMABOND ADVANCED (GAUZE/BANDAGES/DRESSINGS) ×1
DERMABOND ADVANCED .7 DNX12 (GAUZE/BANDAGES/DRESSINGS) ×1 IMPLANT
DRAPE LAPAROTOMY 100X72 PEDS (DRAPES) ×2 IMPLANT
ELECT CAUTERY BLADE 6.4 (BLADE) ×2 IMPLANT
ELECT REM PT RETURN 9FT ADLT (ELECTROSURGICAL) ×2
ELECTRODE REM PT RTRN 9FT ADLT (ELECTROSURGICAL) ×1 IMPLANT
GAUZE 4X4 16PLY ~~LOC~~+RFID DBL (SPONGE) ×2 IMPLANT
GLOVE SURG ENC MOIS LTX SZ6 (GLOVE) ×2 IMPLANT
GLOVE SURG UNDER LTX SZ6.5 (GLOVE) ×2 IMPLANT
GOWN STRL REUS W/ TWL LRG LVL3 (GOWN DISPOSABLE) ×1 IMPLANT
GOWN STRL REUS W/TWL 2XL LVL3 (GOWN DISPOSABLE) ×2 IMPLANT
GOWN STRL REUS W/TWL LRG LVL3 (GOWN DISPOSABLE) ×2
KIT BASIN OR (CUSTOM PROCEDURE TRAY) ×2 IMPLANT
KIT TURNOVER KIT B (KITS) ×2 IMPLANT
NDL HYPO 25GX1X1/2 BEV (NEEDLE) ×1 IMPLANT
NEEDLE HYPO 25GX1X1/2 BEV (NEEDLE) ×2 IMPLANT
NS IRRIG 1000ML POUR BTL (IV SOLUTION) ×2 IMPLANT
PACK GENERAL/GYN (CUSTOM PROCEDURE TRAY) ×2 IMPLANT
PAD ARMBOARD 7.5X6 YLW CONV (MISCELLANEOUS) ×4 IMPLANT
SUT MON AB 4-0 PC3 18 (SUTURE) ×2 IMPLANT
SUT VIC AB 3-0 SH 27 (SUTURE) ×2
SUT VIC AB 3-0 SH 27X BRD (SUTURE) ×1 IMPLANT
SYR CONTROL 10ML LL (SYRINGE) ×2 IMPLANT
TOWEL GREEN STERILE (TOWEL DISPOSABLE) ×2 IMPLANT
TOWEL GREEN STERILE FF (TOWEL DISPOSABLE) ×2 IMPLANT

## 2021-12-08 NOTE — Anesthesia Preprocedure Evaluation (Addendum)
Anesthesia Evaluation  Patient identified by MRN, date of birth, ID band Patient awake    Reviewed: Allergy & Precautions, NPO status , Patient's Chart, lab work & pertinent test results  History of Anesthesia Complications Negative for: history of anesthetic complications  Airway Mallampati: II  TM Distance: >3 FB Neck ROM: Full    Dental  (+) Dental Advisory Given   Pulmonary neg pulmonary ROS,    Pulmonary exam normal        Cardiovascular hypertension, Pt. on medications Normal cardiovascular exam     Neuro/Psych PSYCHIATRIC DISORDERS Anxiety Depression CVA, No Residual Symptoms    GI/Hepatic Neg liver ROS, hiatal hernia, GERD  Controlled, Pancreatic cancer    Endo/Other  diabetes K 3.0   Renal/GU negative Renal ROS     Musculoskeletal negative musculoskeletal ROS (+)   Abdominal   Peds  Hematology  (+) anemia ,   Anesthesia Other Findings   Reproductive/Obstetrics                            Anesthesia Physical Anesthesia Plan  ASA: 3  Anesthesia Plan: MAC   Post-op Pain Management: Minimal or no pain anticipated   Induction:   PONV Risk Score and Plan: 2 and Propofol infusion and Treatment may vary due to age or medical condition  Airway Management Planned: Natural Airway and Simple Face Mask  Additional Equipment: None  Intra-op Plan:   Post-operative Plan:   Informed Consent: I have reviewed the patients History and Physical, chart, labs and discussed the procedure including the risks, benefits and alternatives for the proposed anesthesia with the patient or authorized representative who has indicated his/her understanding and acceptance.       Plan Discussed with: CRNA and Anesthesiologist  Anesthesia Plan Comments:        Anesthesia Quick Evaluation

## 2021-12-08 NOTE — Discharge Instructions (Signed)
SURGERY DISCHARGE INSTRUCTIONS: PORT-A-CATH PLACEMENT  Activity You may resume your usual activities as tolerated Ok to shower in 24 hours, but do not bathe or submerge incision underwater for 2 weeks. Do not drive while taking narcotic pain medication.  Wound Care Your incision is covered with skin glue called Dermabond. This will peel off on its own over time. You may shower and allow warm soapy water to run over your incisions. Gently pat dry. Do not submerge your incision underwater. Monitor your incision for any new redness, tenderness, or drainage.  When to Call us: Fever greater than 100.5 New redness, drainage, or swelling at incision site Severe pain, nausea, or vomiting Shortness of breath, difficulty breathing  For questions or concerns, please call the [facility] Surgery office at [phone].

## 2021-12-08 NOTE — Interval H&P Note (Signed)
History and Physical Interval Note:  12/08/2021 7:22 AM  Jacqueline Tyler  has presented today for surgery, with the diagnosis of HISTORY OF PANCREATIC CANCER.  The various methods of treatment have been discussed with the patient and family. After consideration of risks, benefits and other options for treatment, the patient has consented to  Procedure(s): REMOVAL PORT-A-CATH (N/A) as a surgical intervention.  The patient's history has been reviewed, patient examined, no change in status, stable for surgery.  I have reviewed the patient's chart and labs.  Questions were answered to the patient's satisfaction.     Dwan Bolt

## 2021-12-08 NOTE — Anesthesia Postprocedure Evaluation (Signed)
Anesthesia Post Note  Patient: Jacqueline Tyler  Procedure(s) Performed: REMOVAL PORT-A-CATH     Patient location during evaluation: PACU Anesthesia Type: MAC Level of consciousness: awake and alert Pain management: pain level controlled Vital Signs Assessment: post-procedure vital signs reviewed and stable Respiratory status: spontaneous breathing, nonlabored ventilation and respiratory function stable Cardiovascular status: stable and blood pressure returned to baseline Anesthetic complications: no   No notable events documented.  Last Vitals:  Vitals:   12/08/21 0958 12/08/21 1012  BP: (!) 102/51 104/64  Pulse: (!) 57 (!) 55  Resp: (!) 8 14  Temp:  (!) 36.3 C  SpO2: 99% 99%    Last Pain:  Vitals:   12/08/21 0738  TempSrc:   PainSc: 0-No pain                 Audry Pili

## 2021-12-08 NOTE — Op Note (Signed)
Date: 12/08/21  Patient: Jacqueline Tyler MRN: [id number]  Preoperative Diagnosis: History of pancreatic adenocarcinoma, portacath in place Postoperative Diagnosis: Same  Procedure: Portacath removal  Surgeon: Michaelle Birks, MD  EBL: Minimal  Anesthesia: Monitored anesthesia care  Specimens: Port excised and discarded  Indications: [name] is a 61 yo female with a history of pancreatic cancer, for which she underwent placement of a right subclavian port in October 2021, followed by neoadjuvant chemotherapy. She then underwent a Whipple at [facility] in March 2022, and surgical pathology showed a complete pathology response to chemotherapy. No further treatment was recommended, and she has had no evidence of recurrent disease on recent surveillance scans. She elected to have her port removed.  Findings: Port removed with catheter tip in tact.  Procedure details: Informed consent was obtained in the preoperative area prior to the procedure. The patient was brought to the operating room and placed on the table in the supine position. General anesthesia was induced and appropriate lines and drains were placed for intraoperative monitoring. Perioperative antibiotics were administered per SCIP guidelines. The right chest was prepped and draped in the usual sterile fashion. A pre-procedure timeout was taken verifying patient identity, surgical site and procedure to be performed.  The skin overlying the port was infiltrated with 90mL 0.25% bupivicaine with epinephrine.  A skin incision was made through the previous scar and the subcutaneous tissue was divided with cautery.  The capsule around the port was opened with cautery.  The port was grasped and the Prolene sutures anchoring the port were cut and removed.  The patient was placed in Trendelenburg position and the port and catheter were removed.  The catheter tip was inspected and appeared intact at a length of 15 cm.  The wound was inspected and  appeared hemostatic.  The capsular tissue was fulgurated with cautery.  The subcutaneous tissue was reapproximated with interrupted 3-0 Vicryl suture, and the skin was closed with a running subcuticular 4-0 Monocryl suture.  Dermabond was applied.  The patient tolerated the procedure well with no apparent complications.  All counts were correct x2 at the end of the procedure. The patient was taken to PACU in stable condition.  Michaelle Birks, MD 12/08/21 9:43 AM

## 2021-12-08 NOTE — Transfer of Care (Signed)
Immediate Anesthesia Transfer of Care Note  Patient: Jacqueline Tyler  Procedure(s) Performed: REMOVAL PORT-A-CATH  Patient Location: PACU  Anesthesia Type:MAC  Level of Consciousness: drowsy  Airway & Oxygen Therapy: Patient Spontanous Breathing  Post-op Assessment: Report given to RN and Post -op Vital signs reviewed and stable  Post vital signs: Reviewed and stable  Last Vitals:  Vitals Value Taken Time  BP    Temp    Pulse 61 12/08/21 0942  Resp 28 12/08/21 0942  SpO2 98 % 12/08/21 0942  Vitals shown include unvalidated device data.  Last Pain:  Vitals:   12/08/21 0738  TempSrc:   PainSc: 0-No pain         Complications: No notable events documented.

## 2021-12-09 ENCOUNTER — Encounter (HOSPITAL_COMMUNITY): Payer: Self-pay | Admitting: Surgery

## 2021-12-10 ENCOUNTER — Encounter: Payer: Self-pay | Admitting: Hematology

## 2022-02-14 ENCOUNTER — Other Ambulatory Visit: Payer: Self-pay

## 2022-02-14 ENCOUNTER — Inpatient Hospital Stay: Payer: BC Managed Care – PPO | Attending: Hematology

## 2022-02-14 ENCOUNTER — Inpatient Hospital Stay (HOSPITAL_BASED_OUTPATIENT_CLINIC_OR_DEPARTMENT_OTHER): Payer: BC Managed Care – PPO | Admitting: Hematology

## 2022-02-14 ENCOUNTER — Encounter: Payer: Self-pay | Admitting: Hematology

## 2022-02-14 VITALS — BP 118/59 | HR 53 | Temp 98.2°F | Resp 17 | Ht 62.0 in | Wt 130.0 lb

## 2022-02-14 DIAGNOSIS — C250 Malignant neoplasm of head of pancreas: Secondary | ICD-10-CM | POA: Diagnosis present

## 2022-02-14 DIAGNOSIS — Z79899 Other long term (current) drug therapy: Secondary | ICD-10-CM | POA: Insufficient documentation

## 2022-02-14 DIAGNOSIS — I10 Essential (primary) hypertension: Secondary | ICD-10-CM | POA: Diagnosis not present

## 2022-02-14 LAB — CMP (CANCER CENTER ONLY)
ALT: 35 U/L (ref 0–44)
AST: 33 U/L (ref 15–41)
Albumin: 4 g/dL (ref 3.5–5.0)
Alkaline Phosphatase: 105 U/L (ref 38–126)
Anion gap: 10 (ref 5–15)
BUN: 11 mg/dL (ref 8–23)
CO2: 30 mmol/L (ref 22–32)
Calcium: 9.1 mg/dL (ref 8.9–10.3)
Chloride: 102 mmol/L (ref 98–111)
Creatinine: 0.88 mg/dL (ref 0.44–1.00)
GFR, Estimated: >60 mL/min (ref >60)
Glucose, Bld: 111 mg/dL — ABNORMAL HIGH (ref 70–99)
Potassium: 3.4 mmol/L — ABNORMAL LOW (ref 3.5–5.1)
Sodium: 142 mmol/L (ref 135–145)
Total Bilirubin: 1.3 mg/dL — ABNORMAL HIGH (ref 0.3–1.2)
Total Protein: 6.8 g/dL (ref 6.5–8.1)

## 2022-02-14 LAB — CBC WITH DIFFERENTIAL (CANCER CENTER ONLY)
Abs Immature Granulocytes: 0.01 10*3/uL (ref 0.00–0.07)
Basophils Absolute: 0 10*3/uL (ref 0.0–0.1)
Basophils Relative: 0 %
Eosinophils Absolute: 0.2 10*3/uL (ref 0.0–0.5)
Eosinophils Relative: 3 %
HCT: 38.4 % (ref 36.0–46.0)
Hemoglobin: 12.5 g/dL (ref 12.0–15.0)
Immature Granulocytes: 0 %
Lymphocytes Relative: 28 %
Lymphs Abs: 2 10*3/uL (ref 0.7–4.0)
MCH: 27.9 pg (ref 26.0–34.0)
MCHC: 32.6 g/dL (ref 30.0–36.0)
MCV: 85.7 fL (ref 80.0–100.0)
Monocytes Absolute: 0.8 10*3/uL (ref 0.1–1.0)
Monocytes Relative: 12 %
Neutro Abs: 4 10*3/uL (ref 1.7–7.7)
Neutrophils Relative %: 57 %
Platelet Count: 251 10*3/uL (ref 150–400)
RBC: 4.48 MIL/uL (ref 3.87–5.11)
RDW: 14.8 % (ref 11.5–15.5)
WBC Count: 7 10*3/uL (ref 4.0–10.5)
nRBC: 0 % (ref 0.0–0.2)

## 2022-02-14 MED ORDER — DOXYCYCLINE HYCLATE 100 MG PO TABS: 100.0000 mg | ORAL_TABLET | Freq: Two times a day (BID) | ORAL | 0 refills | Status: DC

## 2022-02-14 MED ORDER — PANCRELIPASE (LIP-PROT-AMYL) 36000-114000 UNITS PO CPEP
72000.0000 [IU] | ORAL_CAPSULE | Freq: Three times a day (TID) | ORAL | 2 refills | Status: DC
Start: 2022-02-14 — End: 2022-07-26

## 2022-02-14 NOTE — Progress Notes (Signed)
?[place]   ?[phone] [phone]   ?Clinic Follow up Note  ? ?Patient Care Team: ?Nicola Girt DO as PCP - General (Internal Medicine) ?Truitt Merle, MD as Consulting Physician (Oncology) ?Dwan Bolt, MD as Consulting Physician (General Surgery) ? ?Date of Service:  02/14/2022 ? ?CHIEF COMPLAINT: f/u of pancreatic cancer ? ?CURRENT THERAPY:  ?Surveillance ? ?ASSESSMENT & PLAN:  ?Jacqueline Tyler is a 61 y.o. female with  ? ?1. 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 staging was negative for metastatic disease.  ?-Given the vascular invasion, this was borderline resectable. She completed neoadjuvant chemo FOLFIRINOX q2 weeks 09/12/20 - 12/29/20 to downstage her disease.  ?-She proceeded with whipple surgery by Dr Crisoforo Oxford on 02/08/21. Her surgical path showed no residual disease. She overall had complete response. Adjuvant chemo was not recommended  ?-due to the high recurrence risk of pancreatic cancer, she is under close surveillance.  ?-Surveillance CT AP 10/23/21 was NED. We will plan for repeat in 6 months. ?-she is clinically doing very well. Labs reviewed, tbili slightly elevated to 1.3. ?-she continues on creon but notes her copay/the price went up. I will call into a specialty pharmacy to see if she can get financial assistance from the manufacturer. ?  ?2. HTN ?-meds adjusted during chemo ?-Continue to F/u with PCP ?  ? 3. Tick bit  ?-she found 2 ticks on her body 1-2 days before and removed them ?-I called in prophylactic doxycycline 200 mg once for her ? ?PLAN: ?-I called in creon to a specialty pharmacy to see if she can get manufacturer assistance  ?-I called in one dose doxycycline for tick  bites ?-f/u in 3 months with lab and CT several days before ? ? ?No problem-specific Assessment & Plan notes found for this encounter. ? ? ?SUMMARY OF ONCOLOGIC HISTORY: ?Oncology History Overview Note  ?Cancer Staging ?Pancreatic cancer ([place]) ?Staging form: Exocrine Pancreas, AJCC 8th Edition ?- Clinical stage from 08/24/2020: Stage IIB (cT2, cN1, cM0) - Signed by Truitt Merle, MD on 08/30/2020 ?Stage prefix: Initial diagnosis ? ?  ?Pancreatic cancer [facility])  ?08/20/2020 Imaging  ? CT AP 08/20/20  ?IMPRESSION: ?1. 3.3 x 2.2 cm low density mass is noted in the pancreatic head ?consistent with malignancy. This mass appears to be leading to ?occlusion of the superior mesenteric vein as well as the proximal ?portion of the main portal vein. Collateral circulation is noted. ?There is moderate intrahepatic and extrahepatic biliary dilatation ?which appears to be due to the pancreatic head mass. Pancreatic ?ductal dilatation is noted as well. ?2. Probable 2.7 cm uterine fibroid. ?3. Aortic atherosclerosis. ?  ?Aortic Atherosclerosis (ICD10-I70.0). ?  ?08/20/2020 Tumor Marker  ? Ca19-9 - 927 ?  ?08/22/2020 Procedure  ? ERCP by Dr Watt Climes 08/22/20  ?IMPRESSION ?- The major papilla appeared normal. ?- A biliary sphincterotomy was performed. ?- Cells for cytology obtained in the lower third and middle of the main duct. ?- One plastic stent was placed into the common bile duct. ? ? ?FINAL MICROSCOPIC DIAGNOSIS:  ?- No malignant cells identified  ?- Benign reactive/reparative changes ?  ?08/24/2020 Cancer Staging  ? Staging form: Exocrine Pancreas, AJCC 8th Edition ?- Clinical stage from 08/24/2020: Stage IIB (cT2, cN1, cM0) - Signed by  Truitt Merle, MD on 08/30/2020 ? ?  ?08/24/2020 Procedure  ? EUS by Dr Paulita Fujita 08/24/20  ?IMPRESSION ?- There was no sign of significant pathology in the ampulla. ?- A few malignant-appearing lymph nodes were visualized in the peripancreatic region and ?porta hepatis region. ?- One stent was visualized  endosonographically in the common bile duct. ?- A mass was identified in the pancreatic head. Tissue was obtained from this exam. The ?preliminary diagnosis is consistent with adenocarcinoma. Invasion into SMV/PV seen. ?Lymphadenopathy noted. This was staged T3 N1 Mx by endosonographic criteria. Fine ?needle aspiration performed. ?  ?08/24/2020 Initial Biopsy  ? FINAL MICROSCOPIC DIAGNOSIS: 08/24/20 ?- Malignant cells consistent with adenocarcinoma  ?  ?08/30/2020 Initial Diagnosis  ? Pancreatic cancer [facility]) ?  ?09/05/2020 Imaging  ? CT Chest  ?IMPRESSION: ?1. Stable 2.7 cm infiltrating pancreatic head mass with borderline ?enlarged peripancreatic lymph nodes. ?2. No findings for pulmonary metastatic disease. ?3. Small hiatal hernia. ?4. Aortic atherosclerosis. ?  ?Aortic Atherosclerosis (ICD10-I70.0). ?  ?09/08/2020 Procedure  ? INSERTION PORT-A-CATH by Dr Zenia Resides and Barry Dienes  ?  ?09/12/2020 - 12/29/2020 Chemotherapy  ? Neoadjuvant FOLFIRINOX q2 weeks starting 09/12/20-12/29/20 ? ?  ?12/02/2020 Imaging  ? CT CAP  ?IMPRESSION: ?1. Previously noted mass of the central pancreatic head is almost ?entirely resolved, difficult to discretely appreciate on current ?examination. Findings are consistent with treatment response. There ?remains obstruction of the pancreatic duct near the head neck ?junction with mild prominence of the pancreatic duct, measuring up ?to 5 mm, with atrophy of the distal pancreatic parenchyma. ?2. The portal vein, splenic vein, and superior mesenteric vein are ?now widely patent, previously effaced at the confluence by mass ?effect. ?3. Interval placement of common bile duct stent, tip positioned in ?the distal duodenum, with relief of previously seen biliary ductal ?dilatation. ?4. 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 Following the  bifurcation of the celiac mesenteric trunk, the ?celiac axis appears to directly traverse the vicinity of the mass, ?although there does appear to be a fat plane about the vessel. ?5. The distal small bowel and colon are diffusely somewhat ?hyperenhancing and inflamed appearing with vascular combing and a ?tethered appearance of the distal small bowel. This appearance ?generally suggests inflammatory bowel disease such as Crohn's ?disease. Correlate with referable clinical history, if present. No ?evidence of obstruction or other acute complication. ?6. Hepatic steatosis. ?7. Aortic atherosclerosis. ?  ?12/28/2020 Genetic Testing  ? Negative hereditary cancer genetic testing: no pathogenic variants detected in [employer] Common Hereditary Cancers Panel.  The report date is December 28, 2020.   ? ?The Common Hereditary Cancers Panel offered by [facility] includes sequencing and/or deletion duplication testing of the following 47 genes: APC, ATM, AXIN2, BARD1, BMPR1A, BRCA1, BRCA2, BRIP1, CDH1, CDK4, CDKN2A (p14ARF), CDKN2A (p16INK4a), CHEK2, CTNNA1, DICER1, EPCAM (Deletion/duplication testing only), GREM1 (promoter region deletion/duplication testing only), GREM1, HOXB13, KIT, MEN1, MLH1, MSH2, MSH3, MSH6, MUTYH, NBN, NF1, NHTL1, PALB2, PDGFRA, PMS2, POLD1, POLE, PTEN, RAD50, RAD51C, RAD51D, SDHA, SDHB, SDHC, SDHD, SMAD4, SMARCA4. STK11, TP53, TSC1, TSC2, and VHL.  The following genes were evaluated for sequence changes only: SDHA and HOXB13 c.251G>A variant only. ?  ?01/17/2021 Imaging  ? CT CAP from [facility] ?IMPRESSION ?Small lesion in the pancreatic head measuring approximately 1.1 x 0.8 cm without evidence of vascular involvement or metastasis consistent with previously described, biopsy-proven pancreatic adenocarcinoma. ?  ?  02/08/2021 Surgery  ? Whipple Surgery with Dr Jyl Heinz  ?Final Pathologic Diagnosis    ?  ?A.  GALLBLADDER, CHOLECYSTECTOMY: ?Chronic cholecystitis. ?No malignancy identified. ?  ?B.  BILE DUCT STENT,  REMOVAL (GROSS ONLY DIAGNOSIS): ?Stent, see gross description. ?  ?C.  WHIPPLE RESECTION: ?No residual malignancy identified. ?Chronic and acute inflammation with granulation tissue reaction, fibrosis and features of chronic

## 2022-02-15 LAB — CANCER ANTIGEN 19-9: CA 19-9: 3 U/mL (ref 0–35)

## 2022-02-16 ENCOUNTER — Telehealth: Payer: Self-pay | Admitting: Hematology

## 2022-02-16 NOTE — Telephone Encounter (Signed)
Scheduled follow-up appointments per 3/29 los. Patient is aware. ?

## 2022-04-18 IMAGING — CT CT ABD-PELV W/ CM
2 of 10 series · 13 of 46 positions shown, 15 images · IV contrast (OMNIPAQUE)
Comparison: CT the abdomen and pelvis 06/07/2021.

CLINICAL DATA: 60-year-old female with history of pancreatic
cancer.

EXAM:
CT ABDOMEN AND PELVIS WITH CONTRAST
TECHNIQUE: Multidetector CT imaging of the abdomen and pelvis was performed
using the standard protocol following bolus administration of
intravenous contrast.
CONTRAST:  80mL OMNIPAQUE IOHEXOL 350 MG/ML SOLN

[Series 3: coronal arterial · coronal · arterial · 0.56mm/px · 3 of 81 slices shown]
[im 21/81  soft-tissue]
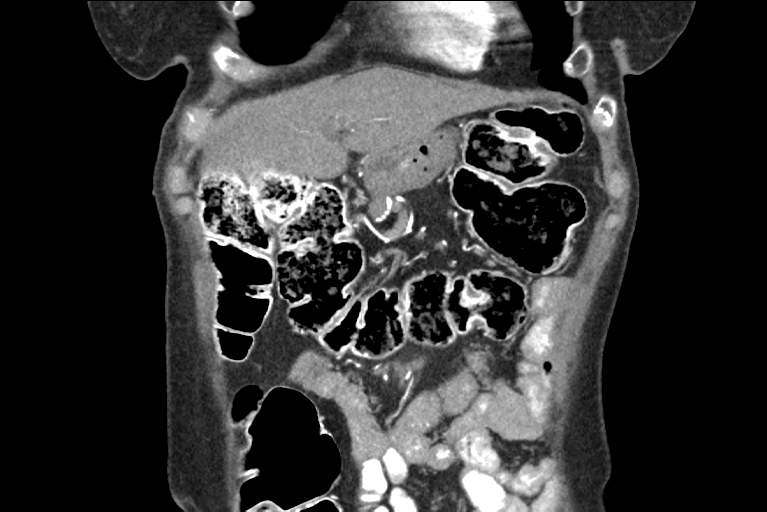
[im 41/81  soft-tissue]
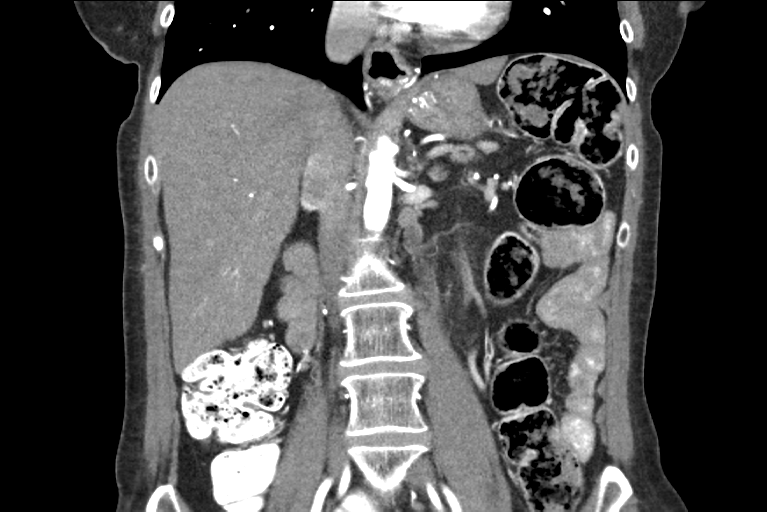
[im 61/81  soft-tissue]
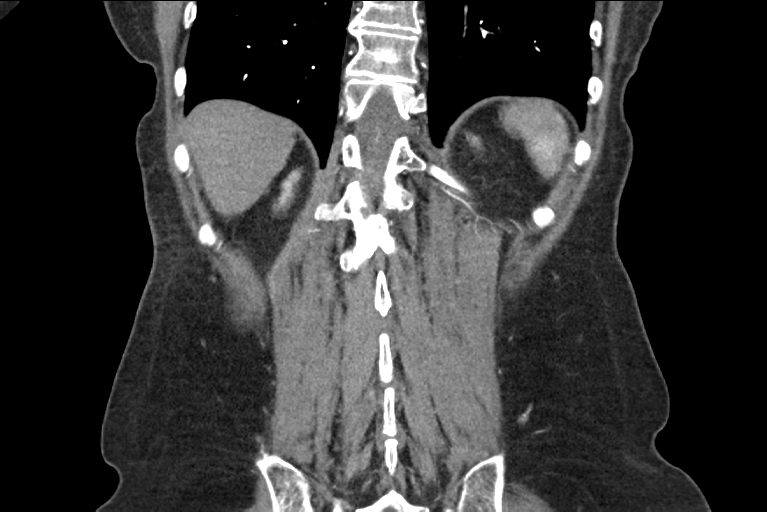

[Series 11: portal thin · axial · portal-venous · 0.71mm/px · z∈[-613,-245]mm · 10 of 226 slices shown, 12 images]
[im 21/226  soft-tissue]
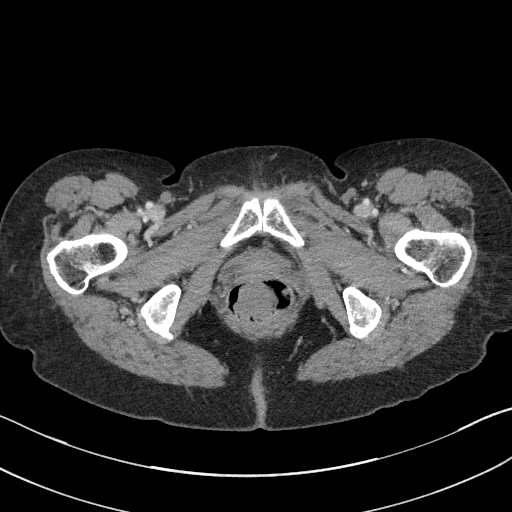
[im 21/226  bone]
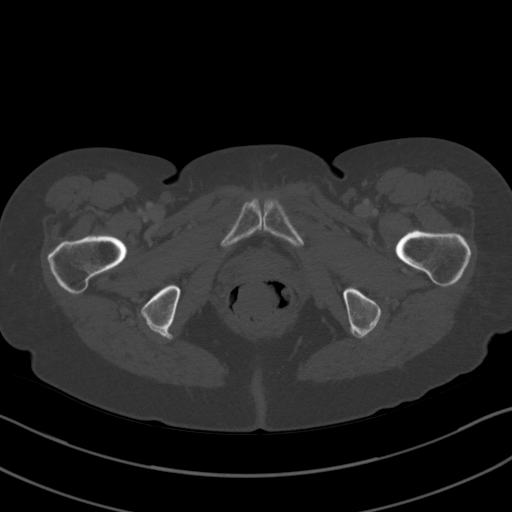
[im 41/226  soft-tissue]
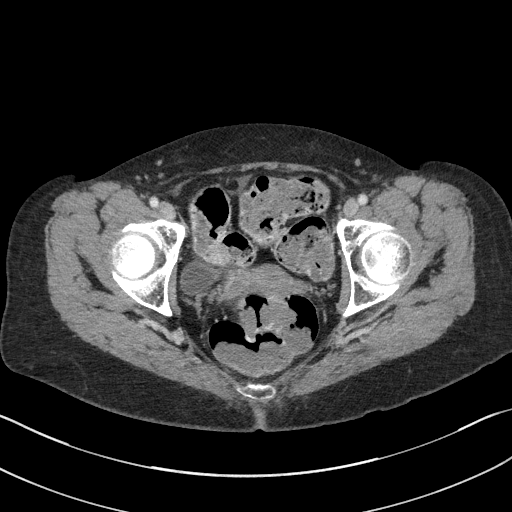
[im 62/226  soft-tissue]
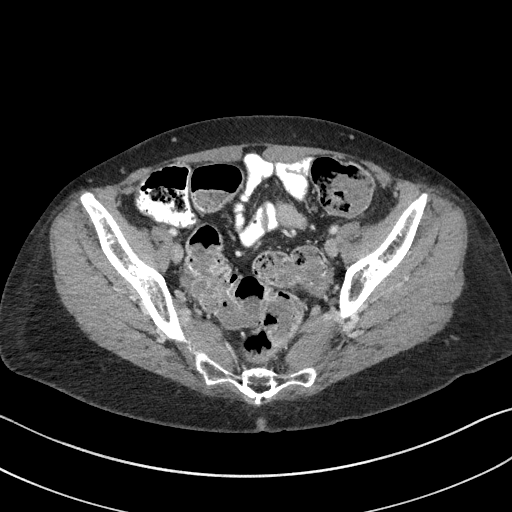
[im 82/226  soft-tissue]
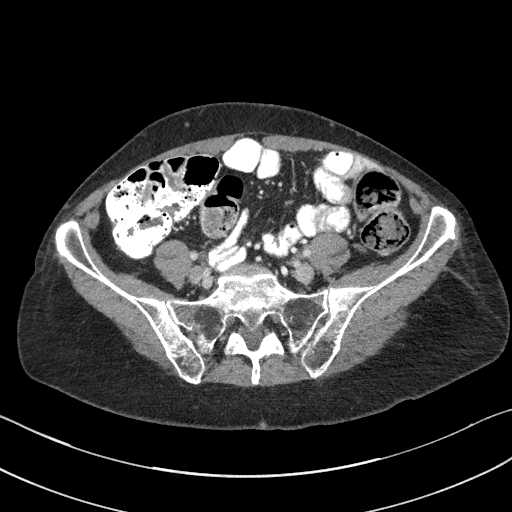
[im 103/226  soft-tissue]
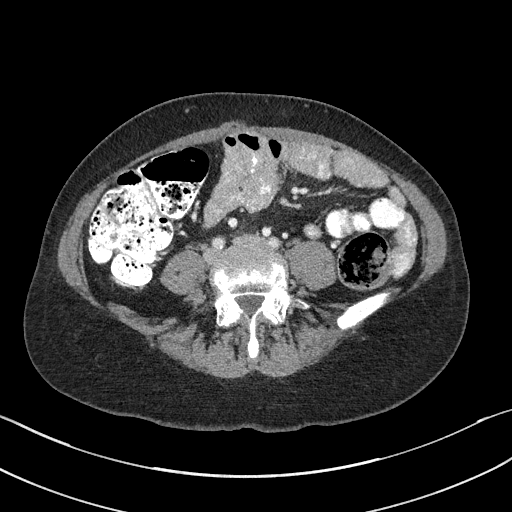
[im 123/226  soft-tissue]
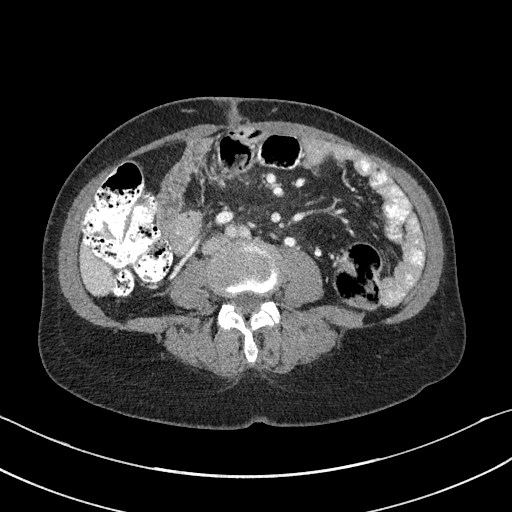
[im 144/226  soft-tissue]
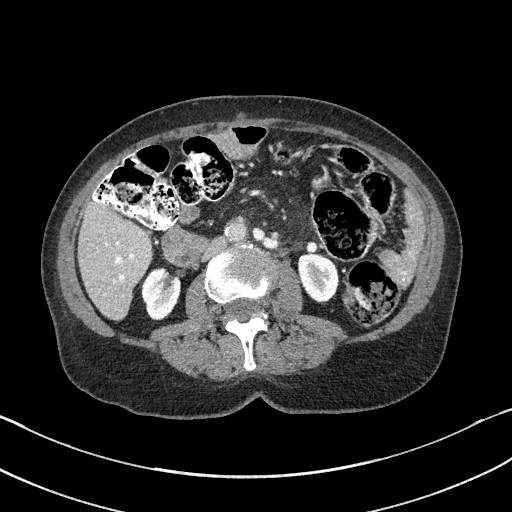
[im 164/226  soft-tissue]
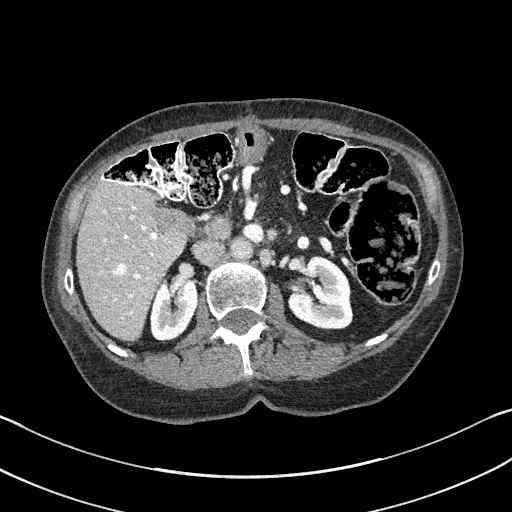
[im 185/226  soft-tissue]
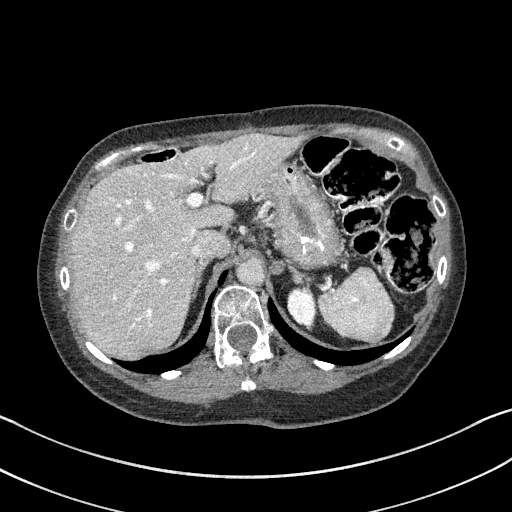
[im 185/226  bone]
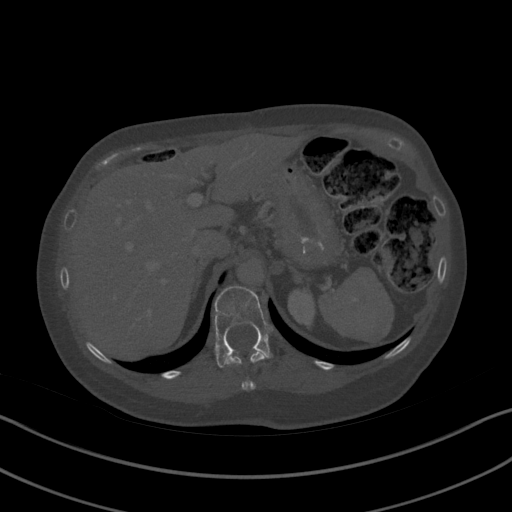
[im 205/226  soft-tissue]
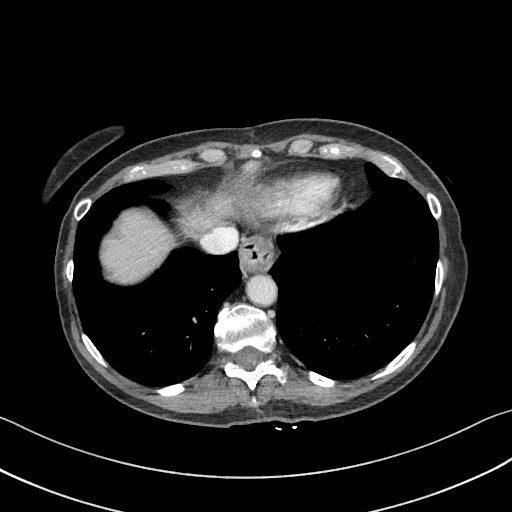

[13 of 46 positions shown; findings below may reference images not displayed]

FINDINGS: Lower chest: Small hiatal hernia.

Hepatobiliary: No suspicious cystic or solid hepatic lesions. No
intra or extrahepatic biliary ductal dilatation. Gallbladder is not
visualized, presumably surgically absent.

Pancreas: Status post Whipple procedure. No definite recurrent
pancreatic mass confidently identified. Atrophy of residual body and
tail of the pancreas. Indwelling stent in the pancreatic duct. No
peripancreatic fluid collections or inflammatory changes.

Spleen: Unremarkable.

Adrenals/Urinary Tract: Bilateral kidneys and adrenal glands are
normal in appearance. No hydroureteronephrosis. Urinary bladder is
nearly decompressed, but otherwise unremarkable in appearance.

Stomach/Bowel: Postoperative changes of prior Whipple procedure. No
pathologic dilatation of small bowel or colon. The appendix is not
confidently identified and may be surgically absent. Regardless,
there are no inflammatory changes noted adjacent to the cecum to
suggest the presence of an acute appendicitis at this time.

Vascular/Lymphatic: Aortic atherosclerosis, without evidence of
aneurysm or dissection in the abdominal or pelvic vasculature. No
lymphadenopathy noted in the abdomen or pelvis.

Reproductive: In the right-side of the uterine fundus there is a
x 1.4 cm hypovascular lesion, similar to the prior examination,
presumably a small fibroid. Uterus and ovaries are otherwise
unremarkable in appearance.

Other: No significant volume of ascites.  No pneumoperitoneum.

Musculoskeletal: There are no aggressive appearing lytic or blastic
lesions noted in the visualized portions of the skeleton.
IMPRESSION: 1. Status post Whipple procedure. No findings to suggest locally
recurrent disease or definite metastatic disease in the abdomen or
pelvis.
2. Uterine fibroid again noted.
3. Aortic atherosclerosis.
4. Additional incidental findings, as above.

## 2022-04-22 ENCOUNTER — Other Ambulatory Visit: Payer: Self-pay | Admitting: Hematology

## 2022-05-15 ENCOUNTER — Ambulatory Visit (HOSPITAL_COMMUNITY)
Admission: RE | Admit: 2022-05-15 | Discharge: 2022-05-15 | Disposition: A | Discharge status: Home / Self Care | Payer: BC Managed Care – PPO | Source: Ambulatory Visit | Attending: Hematology | Admitting: Hematology

## 2022-05-15 ENCOUNTER — Other Ambulatory Visit: Payer: Self-pay

## 2022-05-15 ENCOUNTER — Inpatient Hospital Stay: Payer: BC Managed Care – PPO | Attending: Hematology

## 2022-05-15 DIAGNOSIS — I10 Essential (primary) hypertension: Secondary | ICD-10-CM | POA: Insufficient documentation

## 2022-05-15 DIAGNOSIS — Z79899 Other long term (current) drug therapy: Secondary | ICD-10-CM | POA: Diagnosis not present

## 2022-05-15 DIAGNOSIS — C250 Malignant neoplasm of head of pancreas: Secondary | ICD-10-CM | POA: Insufficient documentation

## 2022-05-15 LAB — CMP (CANCER CENTER ONLY)
ALT: 42 U/L (ref 0–44)
AST: 39 U/L (ref 15–41)
Albumin: 3.9 g/dL (ref 3.5–5.0)
Alkaline Phosphatase: 95 U/L (ref 38–126)
Anion gap: 6 (ref 5–15)
BUN: 5 mg/dL — ABNORMAL LOW (ref 8–23)
CO2: 29 mmol/L (ref 22–32)
Calcium: 8.9 mg/dL (ref 8.9–10.3)
Chloride: 106 mmol/L (ref 98–111)
Creatinine: 0.87 mg/dL (ref 0.44–1.00)
GFR, Estimated: >60 mL/min (ref >60)
Glucose, Bld: 94 mg/dL (ref 70–99)
Potassium: 3.6 mmol/L (ref 3.5–5.1)
Sodium: 141 mmol/L (ref 135–145)
Total Bilirubin: 1.2 mg/dL (ref 0.3–1.2)
Total Protein: 6.5 g/dL (ref 6.5–8.1)

## 2022-05-15 LAB — CBC WITH DIFFERENTIAL (CANCER CENTER ONLY)
Abs Immature Granulocytes: 0.03 10*3/uL (ref 0.00–0.07)
Basophils Absolute: 0 10*3/uL (ref 0.0–0.1)
Basophils Relative: 0 %
Eosinophils Absolute: 0.2 10*3/uL (ref 0.0–0.5)
Eosinophils Relative: 2 %
HCT: 37.5 % (ref 36.0–46.0)
Hemoglobin: 12.3 g/dL (ref 12.0–15.0)
Immature Granulocytes: 0 %
Lymphocytes Relative: 26 %
Lymphs Abs: 1.8 10*3/uL (ref 0.7–4.0)
MCH: 28.1 pg (ref 26.0–34.0)
MCHC: 32.8 g/dL (ref 30.0–36.0)
MCV: 85.8 fL (ref 80.0–100.0)
Monocytes Absolute: 0.8 10*3/uL (ref 0.1–1.0)
Monocytes Relative: 11 %
Neutro Abs: 4.2 10*3/uL (ref 1.7–7.7)
Neutrophils Relative %: 61 %
Platelet Count: 275 10*3/uL (ref 150–400)
RBC: 4.37 MIL/uL (ref 3.87–5.11)
RDW: 14.4 % (ref 11.5–15.5)
WBC Count: 6.9 10*3/uL (ref 4.0–10.5)
nRBC: 0 % (ref 0.0–0.2)

## 2022-05-15 MED ORDER — IOHEXOL 300 MG/ML  SOLN
100.0000 mL | Freq: Once | INTRAMUSCULAR | Status: AC | PRN
  Administered 2022-05-15: 100 mL via INTRAVENOUS

## 2022-05-15 MED ORDER — SODIUM CHLORIDE (PF) 0.9 % IJ SOLN
INTRAMUSCULAR | Status: AC
  Filled 2022-05-15: qty 50

## 2022-05-16 LAB — CANCER ANTIGEN 19-9: CA 19-9: 3 U/mL (ref 0–35)

## 2022-05-17 ENCOUNTER — Inpatient Hospital Stay (HOSPITAL_BASED_OUTPATIENT_CLINIC_OR_DEPARTMENT_OTHER): Payer: BC Managed Care – PPO | Admitting: Hematology

## 2022-05-17 ENCOUNTER — Other Ambulatory Visit: Payer: Self-pay

## 2022-05-17 ENCOUNTER — Encounter: Payer: Self-pay | Admitting: Hematology

## 2022-05-17 VITALS — BP 109/59 | HR 55 | Temp 98.2°F | Resp 18 | Ht 62.0 in | Wt 128.2 lb

## 2022-05-17 DIAGNOSIS — C250 Malignant neoplasm of head of pancreas: Secondary | ICD-10-CM | POA: Diagnosis not present

## 2022-05-17 NOTE — Progress Notes (Signed)
[place]   [phone] [phone]   Clinic Follow up Note   Patient Care Team: Nicola Girt, DO as PCP - General (Internal Medicine) Truitt Merle, MD as Consulting Physician (Oncology) Dwan Bolt, MD as Consulting Physician (General Surgery)  Date of Service:  05/17/2022  CHIEF COMPLAINT: f/u of pancreatic cancer  CURRENT THERAPY:  Surveillance  ASSESSMENT & PLAN:  Jacqueline Tyler is a 61 y.o. female with   1. 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 staging was negative for metastatic disease.  -Given the vascular invasion, this was borderline resectable. She completed neoadjuvant chemo FOLFIRINOX q2 weeks 09/12/20 - 12/29/20. -She proceeded with whipple surgery by Dr Crisoforo Oxford on 02/08/21. Her surgical path showed no residual disease.  -due to the high recurrence risk of pancreatic cancer, she is under close surveillance.  -Surveillance CT CAP 05/15/22 was NED. I reviewed the images and discussed the results with them today. -she is taking creon twice a day and still experiences some diarrhea. I discussed she can increase to three times if the cost is not burdensome. -she is clinically doing very well. Labs reviewed, WNL. Ca 19.9 also remains WNL. -she is coming up on 2 years out from diagnosis. I discussed that we will plan to scan her one more time then continue surveillance with lab work alone to complete 5 years. We reviewed concerning symptoms to watch for, such as excessive fatigue or abdominal pain.   2. HTN -meds adjusted during chemo -Continue to F/u with PCP     PLAN: -lab and scan reviewed, NED -continue creon, she will increase to tid  -lab and f/u in 4  months   No problem-specific Assessment & Plan notes found for this encounter.   SUMMARY OF ONCOLOGIC HISTORY: Oncology History Overview Note  Cancer Staging Pancreatic cancer [facility]) Staging form: Exocrine Pancreas, AJCC 8th Edition - Clinical stage from 08/24/2020: Stage IIB (cT2, cN1, cM0) - Signed by Truitt Merle, MD on 08/30/2020 Stage prefix: Initial diagnosis    Pancreatic cancer ([facility])  08/20/2020 Imaging   CT AP 08/20/20  IMPRESSION: 1. 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 Collateral circulation is noted. There is moderate intrahepatic and extrahepatic biliary dilatation which appears to be due to the pancreatic head mass. Pancreatic ductal dilatation is noted as well. 2. Probable 2.7 cm uterine fibroid. 3. Aortic atherosclerosis.   Aortic Atherosclerosis (ICD10-I70.0).   08/20/2020 Tumor Marker   Ca19-9 - 927   08/22/2020 Procedure   ERCP by Dr Watt Climes 08/22/20  IMPRESSION - The major papilla appeared normal. - A biliary sphincterotomy was performed. - Cells for cytology obtained in the lower third and middle of the main duct. - One plastic stent was placed into the common bile duct.   FINAL MICROSCOPIC DIAGNOSIS:  - No malignant cells identified  - Benign reactive/reparative changes   08/24/2020 Cancer Staging   Staging form: Exocrine Pancreas, AJCC 8th Edition - Clinical stage from 08/24/2020: Stage IIB (cT2, cN1, cM0) - Signed by Truitt Merle, MD on 08/30/2020   08/24/2020 Procedure   EUS  by Dr Paulita Fujita 08/24/20  IMPRESSION - There was no sign of significant pathology in the ampulla. - A few malignant-appearing lymph nodes were visualized in the peripancreatic region and porta hepatis region. - One stent was visualized endosonographically in the common bile duct. - A mass was  identified in the pancreatic head. Tissue was obtained from this exam. The preliminary diagnosis is consistent with adenocarcinoma. Invasion into SMV/PV seen. Lymphadenopathy noted. This was staged T3 N1 Mx by endosonographic criteria. Fine needle aspiration performed.   08/24/2020 Initial Biopsy   FINAL MICROSCOPIC DIAGNOSIS: 08/24/20 - Malignant cells consistent with adenocarcinoma    08/30/2020 Initial Diagnosis   Pancreatic cancer ([place])   09/05/2020 Imaging   CT Chest  IMPRESSION: 1. Stable 2.7 cm infiltrating pancreatic head mass with borderline enlarged peripancreatic lymph nodes. 2. No findings for pulmonary metastatic disease. 3. Small hiatal hernia. 4. Aortic atherosclerosis.   Aortic Atherosclerosis (ICD10-I70.0).   09/08/2020 Procedure   INSERTION PORT-A-CATH by Dr Zenia Resides and Barry Dienes    09/12/2020 - 12/29/2020 Chemotherapy   Neoadjuvant FOLFIRINOX q2 weeks starting 09/12/20-12/29/20    12/02/2020 Imaging   CT CAP  IMPRESSION: 1. Previously noted mass of the central pancreatic head is almost entirely resolved, difficult to discretely appreciate on current examination. Findings are consistent with treatment response. There remains obstruction of the pancreatic duct near the head neck junction with mild prominence of the pancreatic duct, measuring up to 5 mm, with atrophy of the distal pancreatic parenchyma. 2. The portal vein, splenic vein, and superior mesenteric vein are now widely patent, previously effaced at the confluence by mass effect. 3. Interval placement of common bile duct stent, tip positioned in the distal duodenum, with relief of previously seen biliary ductal dilatation. 4. 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 Following the bifurcation of the celiac mesenteric trunk, the celiac axis  appears to directly traverse the vicinity of the mass, although there does appear to be a fat plane about the vessel. 5. The distal small bowel and colon are diffusely somewhat hyperenhancing and inflamed appearing with vascular combing and a tethered appearance of the distal small bowel. This appearance generally suggests inflammatory bowel disease such as Crohn's disease. Correlate with referable clinical history, if present. No evidence of obstruction or other acute complication. 6. Hepatic steatosis. 7. Aortic atherosclerosis.   12/28/2020 Genetic Testing   Negative hereditary cancer genetic testing: no pathogenic variants detected in [employer] Common Hereditary Cancers Panel.  The report date is December 28, 2020.    The Common Hereditary Cancers Panel offered by [employer] includes sequencing and/or deletion duplication testing of the following 47 genes: APC, ATM, AXIN2, BARD1, BMPR1A, BRCA1, BRCA2, BRIP1, CDH1, CDK4, CDKN2A (p14ARF), CDKN2A (p16INK4a), CHEK2, CTNNA1, DICER1, EPCAM (Deletion/duplication testing only), GREM1 (promoter region deletion/duplication testing only), GREM1, HOXB13, KIT, MEN1, MLH1, MSH2, MSH3, MSH6, MUTYH, NBN, NF1, NHTL1, PALB2, PDGFRA, PMS2, POLD1, POLE, PTEN, RAD50, RAD51C, RAD51D, SDHA, SDHB, SDHC, SDHD, SMAD4, SMARCA4. STK11, TP53, TSC1, TSC2, and VHL.  The following genes were evaluated for sequence changes only: SDHA and HOXB13 c.251G>A variant only.   01/17/2021 Imaging   CT CAP from [facility] IMPRESSION Small lesion in the pancreatic head measuring approximately 1.1 x 0.8 cm without evidence of vascular involvement or metastasis consistent with previously described, biopsy-proven pancreatic adenocarcinoma.   02/08/2021 Surgery   Whipple Surgery with Dr Jyl Heinz  Final Pathologic  Diagnosis      A.  GALLBLADDER, CHOLECYSTECTOMY: Chronic cholecystitis. No malignancy identified.   B.  BILE DUCT STENT, REMOVAL (GROSS ONLY DIAGNOSIS): Stent, see gross description.    C.  WHIPPLE RESECTION: No residual malignancy identified. Chronic and acute inflammation with granulation tissue reaction, fibrosis and features of chronic pancreatitis, suggestive of therapy effect. Fourteen lymph nodes, negative for metastasis (0/14). Margins negative for malignancy.       06/07/2021 Imaging   CT AP  IMPRESSION: 1. No current findings of recurrent malignancy. Interval Whipple procedure with expected postoperative findings. 2. A 3.5 cm stent is present in the dorsal pancreatic duct. No duct dilatation. There is an approximately 1.5 mm lucency centrally along this stent shown on image 43 series 7, possibilities include stent fracture, two separate stents, or an intentional radial lucency in this type of stent. 3. Small type 1 hiatal hernia. Mild distal esophageal wall thickening may reflect low-grade esophagitis. 4.  Aortic Atherosclerosis (ICD10-I70.0). 5.  Prominent stool throughout the colon favors constipation. 6. Uterine fibroid. 7. 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   Port-A-Cath in place     INTERVAL HISTORY:  Jacqueline Tyler is here for a follow up of pancreatic cancer. She was last seen by me on 02/14/22. She presents to the clinic accompanied by her husband. She reports she is having diarrhea when she does not use creon. She denies any stomach cramps or pain. She notes she is eating well.   All other systems were reviewed with the patient and are negative.  MEDICAL HISTORY:  Past Medical History:  Diagnosis Date   Anxiety    Cancer ([place]) 07/2020   pancreatic cancer   Depression    Diabetes mellitus without complication ([place]) 89/3810   pancreatic cancer   High cholesterol    History of blood transfusion    History of hiatal hernia    small   Hypertension    Stroke [facility])    age 78, no residual effect    SURGICAL HISTORY: Past Surgical History:  Procedure Laterality Date   BILIARY  BRUSHING  08/22/2020   Procedure: BILIARY BRUSHING;  Surgeon: Clarene Essex, MD;  Location: [facility];  Service: Endoscopy;;   BILIARY STENT PLACEMENT N/A 08/22/2020   Procedure: BILIARY STENT PLACEMENT;  Surgeon: Clarene Essex, MD;  Location: [facility];  Service: Endoscopy;  Laterality: N/A;   ENDOSCOPIC RETROGRADE CHOLANGIOPANCREATOGRAPHY (ERCP) WITH PROPOFOL N/A 08/22/2020   Procedure: ENDOSCOPIC RETROGRADE CHOLANGIOPANCREATOGRAPHY (ERCP) WITH PROPOFOL;  Surgeon: Clarene Essex, MD;  Location: [facility];  Service: Endoscopy;  Laterality: N/A;   ESOPHAGOGASTRODUODENOSCOPY (EGD) WITH PROPOFOL N/A 08/24/2020   Procedure: ESOPHAGOGASTRODUODENOSCOPY (EGD) WITH PROPOFOL;  Surgeon: Arta Silence, MD;  Location: [facility];  Service: Endoscopy;  Laterality: N/A;   FINE NEEDLE ASPIRATION N/A 08/24/2020   Procedure: FINE NEEDLE ASPIRATION (FNA) LINEAR;  Surgeon: Arta Silence, MD;  Location: [facility];  Service: Endoscopy;  Laterality: N/A;   PORT-A-CATH REMOVAL N/A 12/08/2021   Procedure: REMOVAL PORT-A-CATH;  Surgeon: Dwan Bolt, MD;  Location: [facility];  Service: General;  Laterality: N/A;   PORTACATH PLACEMENT Right 09/08/2020   Procedure: INSERTION PORT-A-CATH;  Surgeon: Dwan Bolt, MD;  Location: [place];  Service: General;  Laterality: Right;   SPHINCTEROTOMY  08/22/2020   Procedure: SPHINCTEROTOMY;  Surgeon: Clarene Essex, MD;  Location: [facility];  Service: Endoscopy;;   UPPER ESOPHAGEAL ENDOSCOPIC ULTRASOUND (EUS) N/A 08/24/2020   Procedure: UPPER ESOPHAGEAL ENDOSCOPIC ULTRASOUND (EUS);  Surgeon: Arta Silence, MD;  Location: [facility];  Service: Endoscopy;  Laterality: N/A;    I have reviewed the social history and family history with the patient and they are unchanged from previous note.  ALLERGIES:  is allergic to tyloxapol, oxycodone-acetaminophen, and poison ivy extract.  MEDICATIONS:  Current Outpatient Medications  Medication Sig Dispense Refill    acetaminophen (TYLENOL) 325 MG tablet Take 650 mg by mouth every 6 (six) hours as needed (pain).     citalopram (CELEXA) 40 MG tablet Take 40 mg by mouth every evening.     cloNIDine (CATAPRES) 0.1 MG tablet Take 0.1 mg by mouth at bedtime.     doxycycline (VIBRA-TABS) 100 MG tablet Take 1 tablet (100 mg total) by mouth 2 (two) times daily. 2 tablet 0   hydrochlorothiazide (MICROZIDE) 12.5 MG capsule Take 12.5 mg by mouth in the morning.     lipase/protease/amylase (CREON) 36000 UNITS CPEP capsule Take 2 capsules (72,000 Units total) by mouth with breakfast, with lunch, and with evening meal. 240 capsule 2   magnesium oxide (MAG-OX) 400 (241.3 Mg) MG tablet Take 1 tablet (400 mg total) by mouth 2 (two) times daily. (Patient taking differently: Take 400 mg by mouth every evening.) 60 tablet 0   potassium chloride (KLOR-CON) 10 MEQ tablet TAKE 1 TABLET BY MOUTH EVERY DAY 90 tablet 1   simvastatin (ZOCOR) 80 MG tablet Take 80 mg by mouth every evening.     No current facility-administered medications for this visit.    PHYSICAL EXAMINATION: ECOG PERFORMANCE STATUS: 0 - Asymptomatic  Vitals:   05/17/22 0858  BP: (!) 109/59  Pulse: (!) 55  Resp: 18  Temp: 98.2 F (36.8 C)  SpO2: 100%   Wt Readings from Last 3 Encounters:  05/17/22 128 lb 3.2 oz (58.2 kg)  02/14/22 130 lb (59 kg)  12/08/21 130 lb (59 kg)     GENERAL:alert, no distress and comfortable SKIN: skin color normal, no rashes or significant lesions EYES: normal, Conjunctiva are pink and non-injected, sclera clear  NEURO: alert & oriented x 3 with fluent speech  LABORATORY DATA:  I have reviewed the data as listed    Latest Ref Rng & Units 05/15/2022    7:39 AM 02/14/2022    7:51 AM 12/06/2021    1:53 PM  CBC  WBC 4.0 - 10.5 K/uL 6.9  7.0  7.8   Hemoglobin 12.0 - 15.0 g/dL 12.3  12.5  11.4   Hematocrit 36.0 - 46.0 % 37.5  38.4  35.4   Platelets 150 - 400 K/uL 275  251  285         Latest Ref Rng & Units 05/15/2022     7:39 AM 02/14/2022    7:51 AM 12/06/2021    1:53 PM  CMP  Glucose 70 - 99 mg/dL 94  111  97   BUN 8 - 23 mg/dL $Remove'5  11  8   'gGGSssz$ Creatinine 0.44 - 1.00 mg/dL 0.87  0.88  0.90   Sodium 135 - 145 mmol/L 141  142  139   Potassium 3.5 - 5.1 mmol/L 3.6  3.4  3.0   Chloride 98 - 111 mmol/L 106  102  103   CO2 22 - 32 mmol/L $RemoveB'29  30  30   'GuyhvyxO$ Calcium 8.9 - 10.3 mg/dL 8.9  9.1  8.4   Total Protein 6.5 - 8.1 g/dL 6.5  6.8  6.6   Total Bilirubin 0.3 - 1.2 mg/dL 1.2  1.3  0.7  Alkaline Phos 38 - 126 U/L 95  105  114   AST 15 - 41 U/L 39  33  38   ALT 0 - 44 U/L 42  35  36       RADIOGRAPHIC STUDIES: I have personally reviewed the radiological images as listed and agreed with the findings in the report. CT CHEST ABDOMEN PELVIS W CONTRAST  Result Date: 05/15/2022 CLINICAL DATA:  Pancreatic cancer; * Tracking Code: BO * EXAM: CT CHEST, ABDOMEN, AND PELVIS WITH CONTRAST TECHNIQUE: Multidetector CT imaging of the chest, abdomen and pelvis was performed following the standard protocol during bolus administration of intravenous contrast. RADIATION DOSE REDUCTION: This exam was performed according to the departmental dose-optimization program which includes automated exposure control, adjustment of the mA and/or kV according to patient size and/or use of iterative reconstruction technique. CONTRAST:  11mL OMNIPAQUE IOHEXOL 300 MG/ML  SOLN COMPARISON:  CT abdomen and pelvis dated October 23, 2021 FINDINGS: CT CHEST FINDINGS Cardiovascular: Normal heart size. No pericardial effusion. No coronary artery calcifications. Normal caliber thoracic aorta with mild atherosclerotic disease. Mediastinum/Nodes: Thyroid is unremarkable. Small hiatal hernia. No pathologically enlarged lymph nodes seen in the chest. Lungs/Pleura: Central airways are patent. No consolidation, pleural effusion or pneumothorax. No suspicious pulmonary nodules. Musculoskeletal: No chest wall mass or suspicious bone lesions identified. CT ABDOMEN PELVIS  FINDINGS Hepatobiliary: No suspicious liver lesions. Pneumobilia. Gallbladder is surgically absent. Pancreas: Atrophic residual pancreatic body and tail with no evidence of pancreatic ductal dilation. Spleen: Normal in size without focal abnormality. Adrenals/Urinary Tract: Adrenal glands are unremarkable. Kidneys are normal, without renal calculi, focal lesion, or hydronephrosis. Bladder is unremarkable. Stomach/Bowel: Postoperative changes of prior Whipple procedure. No evidence of bowel wall thickening, distention or inflammatory change. Vascular/Lymphatic: Aortic atherosclerosis. No enlarged abdominal or pelvic lymph nodes. Reproductive: Fibroid uterus.  No adnexal mass. Other: No abdominal wall hernia or abnormality. No abdominopelvic ascites. Musculoskeletal: No acute or significant osseous findings. IMPRESSION: 1. Status post Whipple procedure. No evidence of recurrent or metastatic disease. 2.  Aortic Atherosclerosis (ICD10-I70.0). Electronically Signed   By: Yetta Glassman M.D.   On: 05/15/2022 20:03      Orders Placed This Encounter  Procedures   Cancer antigen 19-9    Standing Status:   Standing    Number of Occurrences:   20    Standing Expiration Date:   05/17/2023   CBC with Differential/Platelet    Standing Status:   Standing    Number of Occurrences:   50    Standing Expiration Date:   05/17/2023   Comprehensive metabolic panel    Standing Status:   Standing    Number of Occurrences:   50    Standing Expiration Date:   05/17/2023   All questions were answered. The patient knows to call the clinic with any problems, questions or concerns. No barriers to learning was detected. The total time spent in the appointment was 30 minutes.     Truitt Merle, MD 05/17/2022   I, Wilburn Mylar, am acting as scribe for Truitt Merle, MD.   I have reviewed the above documentation for accuracy and completeness, and I agree with the above.

## 2022-07-25 ENCOUNTER — Other Ambulatory Visit: Payer: Self-pay | Admitting: Hematology

## 2022-09-13 ENCOUNTER — Encounter: Payer: Self-pay | Admitting: Hematology

## 2022-09-13 ENCOUNTER — Inpatient Hospital Stay: Payer: BC Managed Care – PPO | Attending: Hematology | Admitting: Hematology

## 2022-09-13 ENCOUNTER — Inpatient Hospital Stay: Payer: BC Managed Care – PPO

## 2022-09-13 VITALS — BP 133/71 | HR 68 | Temp 98.0°F | Resp 18 | Ht 62.0 in | Wt 126.7 lb

## 2022-09-13 DIAGNOSIS — I10 Essential (primary) hypertension: Secondary | ICD-10-CM | POA: Insufficient documentation

## 2022-09-13 DIAGNOSIS — E119 Type 2 diabetes mellitus without complications: Secondary | ICD-10-CM | POA: Diagnosis not present

## 2022-09-13 DIAGNOSIS — C250 Malignant neoplasm of head of pancreas: Secondary | ICD-10-CM | POA: Insufficient documentation

## 2022-09-13 LAB — CBC WITH DIFFERENTIAL/PLATELET
Abs Immature Granulocytes: 0.02 10*3/uL (ref 0.00–0.07)
Basophils Absolute: 0 10*3/uL (ref 0.0–0.1)
Basophils Relative: 1 %
Eosinophils Absolute: 0.1 10*3/uL (ref 0.0–0.5)
Eosinophils Relative: 2 %
HCT: 40 % (ref 36.0–46.0)
Hemoglobin: 12.9 g/dL (ref 12.0–15.0)
Immature Granulocytes: 0 %
Lymphocytes Relative: 26 %
Lymphs Abs: 1.7 10*3/uL (ref 0.7–4.0)
MCH: 28.5 pg (ref 26.0–34.0)
MCHC: 32.3 g/dL (ref 30.0–36.0)
MCV: 88.3 fL (ref 80.0–100.0)
Monocytes Absolute: 0.6 10*3/uL (ref 0.1–1.0)
Monocytes Relative: 10 %
Neutro Abs: 4.2 10*3/uL (ref 1.7–7.7)
Neutrophils Relative %: 61 %
Platelets: 280 10*3/uL (ref 150–400)
RBC: 4.53 MIL/uL (ref 3.87–5.11)
RDW: 14.3 % (ref 11.5–15.5)
WBC: 6.7 10*3/uL (ref 4.0–10.5)
nRBC: 0 % (ref 0.0–0.2)

## 2022-09-13 LAB — COMPREHENSIVE METABOLIC PANEL
ALT: 37 U/L (ref 0–44)
AST: 29 U/L (ref 15–41)
Albumin: 3.9 g/dL (ref 3.5–5.0)
Alkaline Phosphatase: 86 U/L (ref 38–126)
Anion gap: 7 (ref 5–15)
BUN: 13 mg/dL (ref 8–23)
CO2: 28 mmol/L (ref 22–32)
Calcium: 8.7 mg/dL — ABNORMAL LOW (ref 8.9–10.3)
Chloride: 106 mmol/L (ref 98–111)
Creatinine, Ser: 0.88 mg/dL (ref 0.44–1.00)
GFR, Estimated: >60 mL/min (ref >60)
Glucose, Bld: 98 mg/dL (ref 70–99)
Potassium: 3.4 mmol/L — ABNORMAL LOW (ref 3.5–5.1)
Sodium: 141 mmol/L (ref 135–145)
Total Bilirubin: 1.4 mg/dL — ABNORMAL HIGH (ref 0.3–1.2)
Total Protein: 6.4 g/dL — ABNORMAL LOW (ref 6.5–8.1)

## 2022-09-13 MED ORDER — PANCRELIPASE (LIP-PROT-AMYL) 36000-114000 UNITS PO CPEP: ORAL_CAPSULE | ORAL | 5 refills | Status: DC

## 2022-09-13 NOTE — Progress Notes (Signed)
[facility]   [phone] [phone]   Clinic Follow up Note   Patient Care Team: Nicola Girt, DO as PCP - General (Internal Medicine) Truitt Merle, MD as Consulting Physician (Oncology) Dwan Bolt, MD as Consulting Physician (General Surgery)  Date of Service:  09/13/2022  CHIEF COMPLAINT: f/u of pancreatic cancer  CURRENT THERAPY:  Surveillance  ASSESSMENT & PLAN:  Jacqueline Tyler is a 61 y.o. female with   1. 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 Staging was negative for metastatic disease.  -she completed neoadjuvant chemo FOLFIRINOX q2 weeks 09/12/20 - 12/29/20. -s/p whipple surgery by Dr Crisoforo Oxford on 02/08/21, path showed no residual disease.  -due to the high recurrence risk of pancreatic cancer, she is under close surveillance.  -Surveillance CT CAP 05/15/22 was NED.  -she is taking creon twice a day and still experiences some diarrhea. I encouraged her to look into manufacturer's assistance to help with cost.  -she is clinically doing very well. Labs reviewed, WNL. Ca 19.9 also remains WNL. -she is now 2 years out from diagnosis. We will scan her once more in 4 months, then continue surveillance with lab work alone to complete 5 years. We reviewed concerning symptoms to watch for, such as excessive fatigue or abdominal pain.   2. HTN -meds adjusted during chemo -Continue to F/u with PCP     PLAN: -continue creon, I encouraged her to look into manufactory financial assistance. -f/u in 4 months with lab and CT several days before   No problem-specific Assessment & Plan notes found for this encounter.   SUMMARY OF ONCOLOGIC HISTORY: Oncology History Overview Note   Cancer Staging Pancreatic cancer [facility]) Staging form: Exocrine Pancreas, AJCC 8th Edition - Clinical stage from 08/24/2020: Stage IIB (cT2, cN1, cM0) - Signed by Truitt Merle, MD on 08/30/2020 Stage prefix: Initial diagnosis    Pancreatic cancer ([place])  08/20/2020 Imaging   CT AP 08/20/20  IMPRESSION: 1. 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 Collateral circulation is noted. There is moderate intrahepatic and extrahepatic biliary dilatation which appears to be due to the pancreatic head mass. Pancreatic ductal dilatation is noted as well. 2. Probable 2.7 cm uterine fibroid. 3. Aortic atherosclerosis.   Aortic Atherosclerosis (ICD10-I70.0).   08/20/2020 Tumor Marker   Ca19-9 - 927   08/22/2020 Procedure   ERCP by Dr Watt Climes 08/22/20  IMPRESSION - The major papilla appeared normal. - A biliary sphincterotomy was performed. - Cells for cytology obtained in the lower third and middle of the main duct. - One plastic stent was placed into the common bile duct.   FINAL MICROSCOPIC DIAGNOSIS:  - No malignant cells identified  - Benign reactive/reparative changes   08/24/2020 Cancer Staging   Staging form: Exocrine Pancreas, AJCC 8th Edition - Clinical stage from 08/24/2020: Stage IIB (cT2, cN1, cM0) - Signed by Truitt Merle, MD on 08/30/2020   08/24/2020 Procedure   EUS by Dr Paulita Fujita 08/24/20  IMPRESSION - There was no sign of significant pathology in the ampulla. - A few malignant-appearing lymph nodes were visualized in  the peripancreatic region and porta hepatis region. - One stent was visualized endosonographically in the common bile duct. - A mass was identified in the pancreatic head. Tissue was obtained from this exam. The preliminary diagnosis is consistent with adenocarcinoma. Invasion into SMV/PV  seen. Lymphadenopathy noted. This was staged T3 N1 Mx by endosonographic criteria. Fine needle aspiration performed.   08/24/2020 Initial Biopsy   FINAL MICROSCOPIC DIAGNOSIS: 08/24/20 - Malignant cells consistent with adenocarcinoma    08/30/2020 Initial Diagnosis   Pancreatic cancer ([place])   09/05/2020 Imaging   CT Chest  IMPRESSION: 1. Stable 2.7 cm infiltrating pancreatic head mass with borderline enlarged peripancreatic lymph nodes. 2. No findings for pulmonary metastatic disease. 3. Small hiatal hernia. 4. Aortic atherosclerosis.   Aortic Atherosclerosis (ICD10-I70.0).   09/08/2020 Procedure   INSERTION PORT-A-CATH by Dr Zenia Resides and Barry Dienes    09/12/2020 - 12/29/2020 Chemotherapy   Neoadjuvant FOLFIRINOX q2 weeks starting 09/12/20-12/29/20    12/02/2020 Imaging   CT CAP  IMPRESSION: 1. Previously noted mass of the central pancreatic head is almost entirely resolved, difficult to discretely appreciate on current examination. Findings are consistent with treatment response. There remains obstruction of the pancreatic duct near the head neck junction with mild prominence of the pancreatic duct, measuring up to 5 mm, with atrophy of the distal pancreatic parenchyma. 2. The portal vein, splenic vein, and superior mesenteric vein are now widely patent, previously effaced at the confluence by mass effect. 3. Interval placement of common bile duct stent, tip positioned in the distal duodenum, with relief of previously seen biliary ductal dilatation. 4. 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 Following the bifurcation of the celiac mesenteric trunk, the celiac axis appears to directly traverse the vicinity of the mass, although there does appear to be a fat plane about the vessel. 5. The distal small bowel and colon  are diffusely somewhat hyperenhancing and inflamed appearing with vascular combing and a tethered appearance of the distal small bowel. This appearance generally suggests inflammatory bowel disease such as Crohn's disease. Correlate with referable clinical history, if present. No evidence of obstruction or other acute complication. 6. Hepatic steatosis. 7. Aortic atherosclerosis.   12/28/2020 Genetic Testing   Negative hereditary cancer genetic testing: no pathogenic variants detected in [employer] Common Hereditary Cancers Panel.  The report date is December 28, 2020.    The Common Hereditary Cancers Panel offered by [employer] includes sequencing and/or deletion duplication testing of the following 47 genes: APC, ATM, AXIN2, BARD1, BMPR1A, BRCA1, BRCA2, BRIP1, CDH1, CDK4, CDKN2A (p14ARF), CDKN2A (p16INK4a), CHEK2, CTNNA1, DICER1, EPCAM (Deletion/duplication testing only), GREM1 (promoter region deletion/duplication testing only), GREM1, HOXB13, KIT, MEN1, MLH1, MSH2, MSH3, MSH6, MUTYH, NBN, NF1, NHTL1, PALB2, PDGFRA, PMS2, POLD1, POLE, PTEN, RAD50, RAD51C, RAD51D, SDHA, SDHB, SDHC, SDHD, SMAD4, SMARCA4. STK11, TP53, TSC1, TSC2, and VHL.  The following genes were evaluated for sequence changes only: SDHA and HOXB13 c.251G>A variant only.   01/17/2021 Imaging   CT CAP from [facility] IMPRESSION Small lesion in the pancreatic head measuring approximately 1.1 x 0.8 cm without evidence of vascular involvement or metastasis consistent with previously described, biopsy-proven pancreatic adenocarcinoma.   02/08/2021 Surgery   Whipple Surgery with Dr Jyl Heinz  Final Pathologic Diagnosis      A.  GALLBLADDER, CHOLECYSTECTOMY: Chronic cholecystitis. No malignancy identified.   B.  BILE DUCT STENT, REMOVAL (GROSS ONLY DIAGNOSIS):  Stent, see gross description.   C.  WHIPPLE RESECTION: No residual malignancy identified. Chronic and acute inflammation with granulation tissue reaction, fibrosis and features of  chronic pancreatitis, suggestive of therapy effect. Fourteen lymph nodes, negative for metastasis (0/14). Margins negative for malignancy.       06/07/2021 Imaging   CT AP  IMPRESSION: 1. No current findings of recurrent malignancy. Interval Whipple procedure with expected postoperative findings. 2. A 3.5 cm stent is present in the dorsal pancreatic duct. No duct dilatation. There is an approximately 1.5 mm lucency centrally along this stent shown on image 43 series 7, possibilities include stent fracture, two separate stents, or an intentional radial lucency in this type of stent. 3. Small type 1 hiatal hernia. Mild distal esophageal wall thickening may reflect low-grade esophagitis. 4.  Aortic Atherosclerosis (ICD10-I70.0). 5.  Prominent stool throughout the colon favors constipation. 6. Uterine fibroid. 7. 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   Port-A-Cath in place     INTERVAL HISTORY:  JAMIAH HOMEYER is here for a follow up of pancreatic cancer. She was last seen by me on 05/17/22. She presents to the clinic accompanied by her husband. She reports she is doing well overall. She tells me her PCP is changing her BP medicines to find a better balance. She reports continued diarrhea. She endorses using creon twice a day, which helps. She notes 3-4 BM a day on the creon.   All other systems were reviewed with the patient and are negative.  MEDICAL HISTORY:  Past Medical History:  Diagnosis Date   Anxiety    Cancer ([place]) 07/2020   pancreatic cancer   Depression    Diabetes mellitus without complication ([place]) 16/0109   pancreatic cancer   High cholesterol    History of blood transfusion    History of hiatal hernia    small   Hypertension    Stroke [facility])    age 66, no residual effect    SURGICAL HISTORY: Past Surgical History:  Procedure Laterality Date   BILIARY BRUSHING  08/22/2020   Procedure: BILIARY BRUSHING;   Surgeon: Clarene Essex, MD;  Location: [facility];  Service: Endoscopy;;   BILIARY STENT PLACEMENT N/A 08/22/2020   Procedure: BILIARY STENT PLACEMENT;  Surgeon: Clarene Essex, MD;  Location: [facility];  Service: Endoscopy;  Laterality: N/A;   ENDOSCOPIC RETROGRADE CHOLANGIOPANCREATOGRAPHY (ERCP) WITH PROPOFOL N/A 08/22/2020   Procedure: ENDOSCOPIC RETROGRADE CHOLANGIOPANCREATOGRAPHY (ERCP) WITH PROPOFOL;  Surgeon: Clarene Essex, MD;  Location: [facility];  Service: Endoscopy;  Laterality: N/A;   ESOPHAGOGASTRODUODENOSCOPY (EGD) WITH PROPOFOL N/A 08/24/2020   Procedure: ESOPHAGOGASTRODUODENOSCOPY (EGD) WITH PROPOFOL;  Surgeon: Arta Silence, MD;  Location: [facility];  Service: Endoscopy;  Laterality: N/A;   FINE NEEDLE ASPIRATION N/A 08/24/2020   Procedure: FINE NEEDLE ASPIRATION (FNA) LINEAR;  Surgeon: Arta Silence, MD;  Location: [facility];  Service: Endoscopy;  Laterality: N/A;   PORT-A-CATH REMOVAL N/A 12/08/2021   Procedure: REMOVAL PORT-A-CATH;  Surgeon: Dwan Bolt, MD;  Location: [place];  Service: General;  Laterality: N/A;   PORTACATH PLACEMENT Right 09/08/2020   Procedure: INSERTION PORT-A-CATH;  Surgeon: Dwan Bolt, MD;  Location: [place];  Service: General;  Laterality: Right;   SPHINCTEROTOMY  08/22/2020   Procedure: SPHINCTEROTOMY;  Surgeon: Clarene Essex, MD;  Location: [facility];  Service: Endoscopy;;   UPPER ESOPHAGEAL ENDOSCOPIC ULTRASOUND (EUS) N/A 08/24/2020   Procedure: UPPER ESOPHAGEAL ENDOSCOPIC ULTRASOUND (EUS);  Surgeon: Arta Silence, MD;  Location:  [facility];  Service: Endoscopy;  Laterality: N/A;    I have reviewed the social history and family history with the patient and they are unchanged from previous note.  ALLERGIES:  is allergic to tyloxapol, oxycodone-acetaminophen, and poison ivy extract.  MEDICATIONS:  Current Outpatient Medications  Medication Sig Dispense Refill   acetaminophen (TYLENOL) 325 MG tablet Take 650 mg  by mouth every 6 (six) hours as needed (pain).     citalopram (CELEXA) 40 MG tablet Take 40 mg by mouth every evening.     doxycycline (VIBRA-TABS) 100 MG tablet Take 1 tablet (100 mg total) by mouth 2 (two) times daily. 2 tablet 0   hydrochlorothiazide (MICROZIDE) 12.5 MG capsule Take 12.5 mg by mouth in the morning.     lipase/protease/amylase (CREON) 36000 UNITS CPEP capsule TAKE 2 CAPSULES BY MOUTH 3 TIMES A DAY WITH A MEAL. MAY ALSO TAKE 1 CAPSULE AS NEEDED WITH SNACKS 240 capsule 5   magnesium oxide (MAG-OX) 400 (241.3 Mg) MG tablet Take 1 tablet (400 mg total) by mouth 2 (two) times daily. (Patient taking differently: Take 400 mg by mouth every evening.) 60 tablet 0   potassium chloride (KLOR-CON) 10 MEQ tablet TAKE 1 TABLET BY MOUTH EVERY DAY 90 tablet 1   simvastatin (ZOCOR) 80 MG tablet Take 80 mg by mouth every evening.     No current facility-administered medications for this visit.    PHYSICAL EXAMINATION: ECOG PERFORMANCE STATUS: 0 - Asymptomatic  Vitals:   09/13/22 0809  BP: 133/71  Pulse: 68  Resp: 18  Temp: 98 F (36.7 C)  SpO2: 99%   Wt Readings from Last 3 Encounters:  09/13/22 126 lb 11.2 oz (57.5 kg)  05/17/22 128 lb 3.2 oz (58.2 kg)  02/14/22 130 lb (59 kg)     GENERAL:alert, no distress and comfortable SKIN: skin color, texture, turgor are normal, no rashes or significant lesions EYES: normal, Conjunctiva are pink and non-injected, sclera clear  NECK: supple, thyroid normal size, non-tender, without nodularity LYMPH:  no palpable lymphadenopathy in the cervical, axillary  LUNGS: clear to auscultation and percussion with normal breathing effort HEART: regular rate & rhythm and no murmurs and no lower extremity edema ABDOMEN:abdomen soft, non-tender and normal bowel sounds Musculoskeletal:no cyanosis of digits and no clubbing  NEURO: alert & oriented x 3 with fluent speech, no focal motor/sensory deficits  LABORATORY DATA:  I have reviewed the data as  listed    Latest Ref Rng & Units 09/13/2022    7:43 AM 05/15/2022    7:39 AM 02/14/2022    7:51 AM  CBC  WBC 4.0 - 10.5 K/uL 6.7  6.9  7.0   Hemoglobin 12.0 - 15.0 g/dL 12.9  12.3  12.5   Hematocrit 36.0 - 46.0 % 40.0  37.5  38.4   Platelets 150 - 400 K/uL 280  275  251         Latest Ref Rng & Units 09/13/2022    7:43 AM 05/15/2022    7:39 AM 02/14/2022    7:51 AM  CMP  Glucose 70 - 99 mg/dL 98  94  111   BUN 8 - 23 mg/dL _0 Creatinine 0.44 - 1.00 mg/dL 0.88  0.87  0.88   Sodium 135 - 145 mmol/L 141  141  142   Potassium 3.5 - 5.1 mmol/L 3.4  3.6  3.4   Chloride 98 - 111 mmol/L 106  106  102   CO2 22 -  32 mmol/L _0 Calcium 8.9 - 10.3 mg/dL 8.7  8.9  9.1   Total Protein 6.5 - 8.1 g/dL 6.4  6.5  6.8   Total Bilirubin 0.3 - 1.2 mg/dL 1.4  1.2  1.3   Alkaline Phos 38 - 126 U/L 86  95  105   AST 15 - 41 U/L 29  39  33   ALT 0 - 44 U/L 37  42  35       RADIOGRAPHIC STUDIES: I have personally reviewed the radiological images as listed and agreed with the findings in the report. No results found.    Orders Placed This Encounter  Procedures   CT CHEST ABDOMEN PELVIS W CONTRAST    Standing Status:   Future    Standing Expiration Date:   09/14/2023    Order Specific Question:   Preferred imaging location?    Answer:   [facility]    Order Specific Question:   Is Oral Contrast requested for this exam?    Answer:   Yes, Per Radiology protocol   All questions were answered. The patient knows to call the clinic with any problems, questions or concerns. No barriers to learning was detected. The total time spent in the appointment was 30 minutes.     Truitt Merle, MD 09/13/2022   I, Wilburn Mylar, am acting as scribe for Truitt Merle, MD.   I have reviewed the above documentation for accuracy and completeness, and I agree with the above.

## 2022-09-15 LAB — CANCER ANTIGEN 19-9: CA 19-9: 3 U/mL (ref 0–35)

## 2022-10-11 ENCOUNTER — Other Ambulatory Visit: Payer: Self-pay | Admitting: Nurse Practitioner

## 2023-01-15 ENCOUNTER — Other Ambulatory Visit: Payer: Self-pay

## 2023-01-15 ENCOUNTER — Encounter (HOSPITAL_COMMUNITY): Payer: Self-pay

## 2023-01-15 ENCOUNTER — Inpatient Hospital Stay: Payer: BC Managed Care – PPO | Attending: Nurse Practitioner

## 2023-01-15 ENCOUNTER — Ambulatory Visit (HOSPITAL_COMMUNITY)
Admission: RE | Admit: 2023-01-15 | Discharge: 2023-01-15 | Disposition: A | Discharge status: Home / Self Care | Payer: No Typology Code available for payment source | Source: Ambulatory Visit | Attending: Hematology | Admitting: Hematology

## 2023-01-15 DIAGNOSIS — Z90411 Acquired partial absence of pancreas: Secondary | ICD-10-CM | POA: Diagnosis not present

## 2023-01-15 DIAGNOSIS — Z79899 Other long term (current) drug therapy: Secondary | ICD-10-CM | POA: Diagnosis not present

## 2023-01-15 DIAGNOSIS — C250 Malignant neoplasm of head of pancreas: Secondary | ICD-10-CM | POA: Insufficient documentation

## 2023-01-15 LAB — CBC WITH DIFFERENTIAL/PLATELET
Abs Immature Granulocytes: 0.01 10*3/uL (ref 0.00–0.07)
Basophils Absolute: 0 10*3/uL (ref 0.0–0.1)
Basophils Relative: 0 %
Eosinophils Absolute: 0.2 10*3/uL (ref 0.0–0.5)
Eosinophils Relative: 2 %
HCT: 39 % (ref 36.0–46.0)
Hemoglobin: 12.7 g/dL (ref 12.0–15.0)
Immature Granulocytes: 0 %
Lymphocytes Relative: 27 %
Lymphs Abs: 1.9 10*3/uL (ref 0.7–4.0)
MCH: 28.9 pg (ref 26.0–34.0)
MCHC: 32.6 g/dL (ref 30.0–36.0)
MCV: 88.8 fL (ref 80.0–100.0)
Monocytes Absolute: 0.7 10*3/uL (ref 0.1–1.0)
Monocytes Relative: 9 %
Neutro Abs: 4.3 10*3/uL (ref 1.7–7.7)
Neutrophils Relative %: 62 %
Platelets: 311 10*3/uL (ref 150–400)
RBC: 4.39 MIL/uL (ref 3.87–5.11)
RDW: 13.9 % (ref 11.5–15.5)
WBC: 7 10*3/uL (ref 4.0–10.5)
nRBC: 0 % (ref 0.0–0.2)

## 2023-01-15 LAB — COMPREHENSIVE METABOLIC PANEL
ALT: 34 U/L (ref 0–44)
AST: 44 U/L — ABNORMAL HIGH (ref 15–41)
Albumin: 4.2 g/dL (ref 3.5–5.0)
Alkaline Phosphatase: 111 U/L (ref 38–126)
Anion gap: 7 (ref 5–15)
BUN: 12 mg/dL (ref 8–23)
CO2: 31 mmol/L (ref 22–32)
Calcium: 8.7 mg/dL — ABNORMAL LOW (ref 8.9–10.3)
Chloride: 101 mmol/L (ref 98–111)
Creatinine, Ser: 0.77 mg/dL (ref 0.44–1.00)
GFR, Estimated: >60 mL/min (ref >60)
Glucose, Bld: 85 mg/dL (ref 70–99)
Potassium: 3.6 mmol/L (ref 3.5–5.1)
Sodium: 139 mmol/L (ref 135–145)
Total Bilirubin: 0.8 mg/dL (ref 0.3–1.2)
Total Protein: 7 g/dL (ref 6.5–8.1)

## 2023-01-15 MED ORDER — IOHEXOL 300 MG/ML  SOLN
100.0000 mL | Freq: Once | INTRAMUSCULAR | Status: AC | PRN
Start: 2023-01-15 — End: 2023-01-15
  Administered 2023-01-15: 100 mL via INTRAVENOUS

## 2023-01-15 MED ORDER — SODIUM CHLORIDE (PF) 0.9 % IJ SOLN
INTRAMUSCULAR | Status: AC
  Filled 2023-01-15: qty 50

## 2023-01-15 MED ORDER — IOHEXOL 9 MG/ML PO SOLN
ORAL | Status: AC
  Filled 2023-01-15: qty 1000

## 2023-01-15 MED ORDER — IOHEXOL 9 MG/ML PO SOLN
1000.0000 mL | ORAL | Status: AC
  Administered 2023-01-15: 1000 mL via ORAL

## 2023-01-15 NOTE — Progress Notes (Unsigned)
[facility]   [phone] [phone]   Clinic Follow up Note   Patient Care Team: Nicola Girt, DO as PCP - General (Internal Medicine) Truitt Merle, MD as Consulting Physician (Oncology) Dwan Bolt, MD as Consulting Physician (General Surgery)  Date of Service:  01/17/2023  CHIEF COMPLAINT: f/u of  pancreatic cancer    CURRENT THERAPY: Surveillance    ASSESSMENT:  Jacqueline Tyler is a 62 y.o. female with   1. 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 Staging was negative for metastatic disease.  -she completed neoadjuvant chemo FOLFIRINOX q2 weeks 09/12/20 - 12/29/20. -s/p whipple surgery by Dr Crisoforo Oxford on 02/08/21, path showed no residual disease.  -due to the high recurrence risk of pancreatic cancer, she is under close surveillance.  -Surveillance CT CAP 05/15/22 was NED.  -I personally reviewed I personally reviewed her surveillance CT scan from January 15, 2023, which showed no evidence of recurrence.  I discussed the findings with patient. -He is clinically doing well, asymptomatic, has good appetite and energy level, will continue monitoring. -Plan to repeat next CT scan in a year if she is clinically doing well.   PLAN: -Lab and CT scan reviewed, NED -Lab and follow-up with NP in 4 months     SUMMARY OF ONCOLOGIC HISTORY: Oncology History Overview Note  Cancer Staging Pancreatic cancer [facility]) Staging form: Exocrine Pancreas, AJCC 8th Edition - Clinical stage from 08/24/2020: Stage IIB (cT2, cN1, cM0) - Signed by Truitt Merle, MD on 08/30/2020 Stage prefix: Initial diagnosis    Pancreatic cancer ([place])  08/20/2020 Imaging   CT AP 08/20/20  IMPRESSION: 1. 3.3 x  2.2 cm low density mass is noted in the pancreatic head consistent with malignancy. 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 Collateral circulation is noted. There is moderate intrahepatic and extrahepatic biliary dilatation which appears to be due to the pancreatic head mass. Pancreatic ductal dilatation is noted as well. 2. Probable 2.7 cm uterine fibroid. 3. Aortic atherosclerosis.   Aortic Atherosclerosis (ICD10-I70.0).   08/20/2020 Tumor Marker   Ca19-9 - 927   08/22/2020 Procedure   ERCP by Dr Watt Climes 08/22/20  IMPRESSION - The major papilla appeared normal. - A biliary sphincterotomy was performed. - Cells for cytology obtained in the lower third and middle of the main duct. - One plastic stent was placed into the common bile duct.   FINAL MICROSCOPIC DIAGNOSIS:  - No malignant cells identified  - Benign reactive/reparative changes   08/24/2020 Cancer Staging   Staging form: Exocrine Pancreas, AJCC 8th Edition - Clinical stage from 08/24/2020: Stage IIB (cT2, cN1, cM0) - Signed by Truitt Merle, MD on 08/30/2020   08/24/2020 Procedure   EUS by Dr Paulita Fujita 08/24/20  IMPRESSION - There was no sign of significant pathology in the ampulla. - A few malignant-appearing lymph nodes were visualized in the peripancreatic region and porta hepatis region. - One stent was visualized endosonographically in the common bile duct. - A mass was identified in the pancreatic head. Tissue was obtained from this exam. The preliminary diagnosis is consistent with adenocarcinoma. Invasion into SMV/PV seen. Lymphadenopathy noted. This was staged T3 N1 Mx by endosonographic  criteria. Fine needle aspiration performed.   08/24/2020 Initial Biopsy   FINAL MICROSCOPIC DIAGNOSIS: 08/24/20 - Malignant cells consistent with adenocarcinoma    08/30/2020 Initial Diagnosis   Pancreatic cancer ([name])   09/05/2020 Imaging    CT Chest  IMPRESSION: 1. Stable 2.7 cm infiltrating pancreatic head mass with borderline enlarged peripancreatic lymph nodes. 2. No findings for pulmonary metastatic disease. 3. Small hiatal hernia. 4. Aortic atherosclerosis.   Aortic Atherosclerosis (ICD10-I70.0).   09/08/2020 Procedure   INSERTION PORT-A-CATH by Dr Zenia Resides and Barry Dienes    09/12/2020 - 12/29/2020 Chemotherapy   Neoadjuvant FOLFIRINOX q2 weeks starting 09/12/20-12/29/20    12/02/2020 Imaging   CT CAP  IMPRESSION: 1. Previously noted mass of the central pancreatic head is almost entirely resolved, difficult to discretely appreciate on current examination. Findings are consistent with treatment response. There remains obstruction of the pancreatic duct near the head neck junction with mild prominence of the pancreatic duct, measuring up to 5 mm, with atrophy of the distal pancreatic parenchyma. 2. The portal vein, splenic vein, and superior mesenteric vein are now widely patent, previously effaced at the confluence by mass effect. 3. Interval placement of common bile duct stent, tip positioned in the distal duodenum, with relief of previously seen biliary ductal dilatation. 4. 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 Following the bifurcation of the celiac mesenteric trunk, the celiac axis appears to directly traverse the vicinity of the mass, although there does appear to be a fat plane about the vessel. 5. The distal small bowel and colon are diffusely somewhat hyperenhancing and inflamed appearing with vascular combing and a tethered appearance of the distal small bowel. This appearance generally suggests inflammatory bowel disease such as Crohn's disease. Correlate with referable clinical history, if present. No evidence of obstruction or other  acute complication. 6. Hepatic steatosis. 7. Aortic atherosclerosis.   12/28/2020 Genetic Testing   Negative hereditary cancer genetic testing: no pathogenic variants detected in [employer] Common Hereditary Cancers Panel.  The report date is December 28, 2020.    The Common Hereditary Cancers Panel offered by [employer] includes sequencing and/or deletion duplication testing of the following 47 genes: APC, ATM, AXIN2, BARD1, BMPR1A, BRCA1, BRCA2, BRIP1, CDH1, CDK4, CDKN2A (p14ARF), CDKN2A (p16INK4a), CHEK2, CTNNA1, DICER1, EPCAM (Deletion/duplication testing only), GREM1 (promoter region deletion/duplication testing only), GREM1, HOXB13, KIT, MEN1, MLH1, MSH2, MSH3, MSH6, MUTYH, NBN, NF1, NHTL1, PALB2, PDGFRA, PMS2, POLD1, POLE, PTEN, RAD50, RAD51C, RAD51D, SDHA, SDHB, SDHC, SDHD, SMAD4, SMARCA4. STK11, TP53, TSC1, TSC2, and VHL.  The following genes were evaluated for sequence changes only: SDHA and HOXB13 c.251G>A variant only.   01/17/2021 Imaging   CT CAP from [facility] IMPRESSION Small lesion in the pancreatic head measuring approximately 1.1 x 0.8 cm without evidence of vascular involvement or metastasis consistent with previously described, biopsy-proven pancreatic adenocarcinoma.   02/08/2021 Surgery   Whipple Surgery with Dr Jyl Heinz  Final Pathologic Diagnosis      A.  GALLBLADDER, CHOLECYSTECTOMY: Chronic cholecystitis. No malignancy identified.   B.  BILE DUCT STENT, REMOVAL (GROSS ONLY DIAGNOSIS): Stent, see gross description.   C.  WHIPPLE RESECTION: No residual malignancy identified. Chronic and acute inflammation with granulation tissue reaction, fibrosis and features of chronic pancreatitis, suggestive of therapy effect. Fourteen lymph nodes, negative for metastasis (0/14). Margins negative for malignancy.       06/07/2021 Imaging   CT  AP  IMPRESSION: 1. No current findings of recurrent malignancy. Interval Whipple procedure with expected postoperative findings. 2. A 3.5 cm  stent is present in the dorsal pancreatic duct. No duct dilatation. There is an approximately 1.5 mm lucency centrally along this stent shown on image 43 series 7, possibilities include stent fracture, two separate stents, or an intentional radial lucency in this type of stent. 3. Small type 1 hiatal hernia. Mild distal esophageal wall thickening may reflect low-grade esophagitis. 4.  Aortic Atherosclerosis (ICD10-I70.0). 5.  Prominent stool throughout the colon favors constipation. 6. Uterine fibroid. 7. 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   Port-A-Cath in place     INTERVAL HISTORY:  LAKEYSA FITTING is here for a follow up of  pancreatic cancer   She was last seen by me on 09/13/2022 She presents to the clinic with her husband.  She has back injury 2 weeks again, she tried heat pad and brace, the pain is much better now. No abdominal discomfort, eats well. Weight stable.    All other systems were reviewed with the patient and are negative.  MEDICAL HISTORY:  Past Medical History:  Diagnosis Date   Anxiety    Cancer ([place]) 07/2020   pancreatic cancer   Depression    Diabetes mellitus without complication ([place]) 0000000   pancreatic cancer   High cholesterol    History of blood transfusion    History of hiatal hernia    small   Hypertension    Stroke [facility])    age 53, no residual effect    SURGICAL HISTORY: Past Surgical History:  Procedure Laterality Date   BILIARY BRUSHING  08/22/2020   Procedure: BILIARY BRUSHING;  Surgeon: Clarene Essex, MD;  Location: [facility];  Service: Endoscopy;;   BILIARY STENT PLACEMENT N/A 08/22/2020   Procedure: BILIARY STENT PLACEMENT;  Surgeon: Clarene Essex, MD;  Location: [facility];  Service: Endoscopy;  Laterality: N/A;   ENDOSCOPIC RETROGRADE CHOLANGIOPANCREATOGRAPHY (ERCP) WITH PROPOFOL N/A 08/22/2020   Procedure: ENDOSCOPIC RETROGRADE CHOLANGIOPANCREATOGRAPHY (ERCP) WITH PROPOFOL;   Surgeon: Clarene Essex, MD;  Location: [facility];  Service: Endoscopy;  Laterality: N/A;   ESOPHAGOGASTRODUODENOSCOPY (EGD) WITH PROPOFOL N/A 08/24/2020   Procedure: ESOPHAGOGASTRODUODENOSCOPY (EGD) WITH PROPOFOL;  Surgeon: Arta Silence, MD;  Location: [facility];  Service: Endoscopy;  Laterality: N/A;   FINE NEEDLE ASPIRATION N/A 08/24/2020   Procedure: FINE NEEDLE ASPIRATION (FNA) LINEAR;  Surgeon: Arta Silence, MD;  Location: [facility];  Service: Endoscopy;  Laterality: N/A;   PORT-A-CATH REMOVAL N/A 12/08/2021   Procedure: REMOVAL PORT-A-CATH;  Surgeon: Dwan Bolt, MD;  Location: [place];  Service: General;  Laterality: N/A;   PORTACATH PLACEMENT Right 09/08/2020   Procedure: INSERTION PORT-A-CATH;  Surgeon: Dwan Bolt, MD;  Location: [place];  Service: General;  Laterality: Right;   SPHINCTEROTOMY  08/22/2020   Procedure: SPHINCTEROTOMY;  Surgeon: Clarene Essex, MD;  Location: [facility];  Service: Endoscopy;;   UPPER ESOPHAGEAL ENDOSCOPIC ULTRASOUND (EUS) N/A 08/24/2020   Procedure: UPPER ESOPHAGEAL ENDOSCOPIC ULTRASOUND (EUS);  Surgeon: Arta Silence, MD;  Location: [facility];  Service: Endoscopy;  Laterality: N/A;    I have reviewed the social history and family history with the patient and they are unchanged from previous note.  ALLERGIES:  is allergic to tyloxapol, oxycodone-acetaminophen, and poison ivy extract.  MEDICATIONS:  Current Outpatient Medications  Medication Sig Dispense Refill   acetaminophen (TYLENOL) 325 MG tablet Take 650 mg by mouth every 6 (six)  hours as needed (pain).     citalopram (CELEXA) 40 MG tablet Take 40 mg by mouth every evening.     doxycycline (VIBRA-TABS) 100 MG tablet Take 1 tablet (100 mg total) by mouth 2 (two) times daily. 2 tablet 0   hydrochlorothiazide (MICROZIDE) 12.5 MG capsule Take 12.5 mg by mouth in the morning.     lipase/protease/amylase (CREON) 36000 UNITS CPEP capsule TAKE 2 CAPSULES BY MOUTH 3  TIMES A DAY WITH A MEAL. MAY ALSO TAKE 1 CAPSULE AS NEEDED WITH SNACKS 240 capsule 5   magnesium oxide (MAG-OX) 400 (241.3 Mg) MG tablet Take 1 tablet (400 mg total) by mouth 2 (two) times daily. (Patient taking differently: Take 400 mg by mouth every evening.) 60 tablet 0   potassium chloride (KLOR-CON) 10 MEQ tablet TAKE 1 TABLET BY MOUTH EVERY DAY 90 tablet 1   simvastatin (ZOCOR) 80 MG tablet Take 80 mg by mouth every evening.     No current facility-administered medications for this visit.    PHYSICAL EXAMINATION: ECOG PERFORMANCE STATUS: 0 - Asymptomatic  Vitals:   01/17/23 0933  BP: 120/66  Pulse: 78  Resp: 18  Temp: 97.9 F (36.6 C)  SpO2: 100%   Wt Readings from Last 3 Encounters:  01/17/23 127 lb 3.2 oz (57.7 kg)  09/13/22 126 lb 11.2 oz (57.5 kg)  05/17/22 128 lb 3.2 oz (58.2 kg)     GENERAL:alert, no distress and comfortable SKIN: skin color, texture, turgor are normal, no rashes or significant lesions EYES: normal, Conjunctiva are pink and non-injected, sclera clear Musculoskeletal:no cyanosis of digits and no clubbing  NEURO: alert & oriented x 3 with fluent speech, no focal motor/sensory deficits  LABORATORY DATA:  I have reviewed the data as listed    Latest Ref Rng & Units 01/15/2023    8:45 AM 09/13/2022    7:43 AM 05/15/2022    7:39 AM  CBC  WBC 4.0 - 10.5 K/uL 7.0  6.7  6.9   Hemoglobin 12.0 - 15.0 g/dL 12.7  12.9  12.3   Hematocrit 36.0 - 46.0 % 39.0  40.0  37.5   Platelets 150 - 400 K/uL 311  280  275         Latest Ref Rng & Units 01/15/2023    8:45 AM 09/13/2022    7:43 AM 05/15/2022    7:39 AM  CMP  Glucose 70 - 99 mg/dL 85  98  94   BUN 8 - 23 mg/dL '12  13  5   '$ Creatinine 0.44 - 1.00 mg/dL 0.77  0.88  0.87   Sodium 135 - 145 mmol/L 139  141  141   Potassium 3.5 - 5.1 mmol/L 3.6  3.4  3.6   Chloride 98 - 111 mmol/L 101  106  106   CO2 22 - 32 mmol/L '31  28  29   '$ Calcium 8.9 - 10.3 mg/dL 8.7  8.7  8.9   Total Protein 6.5 - 8.1 g/dL 7.0   6.4  6.5   Total Bilirubin 0.3 - 1.2 mg/dL 0.8  1.4  1.2   Alkaline Phos 38 - 126 U/L 111  86  95   AST 15 - 41 U/L 44  29  39   ALT 0 - 44 U/L 34  37  42       RADIOGRAPHIC STUDIES: I have personally reviewed the radiological images as listed and agreed with the findings in the report. No results found.    No  orders of the defined types were placed in this encounter.  All questions were answered. The patient knows to call the clinic with any problems, questions or concerns. No barriers to learning was detected. The total time spent in the appointment was 30 minutes.     Truitt Merle, MD 01/17/2023   Felicity Coyer, CMA, am acting as scribe for Truitt Merle, MD.   I have reviewed the above documentation for accuracy and completeness, and I agree with the above.

## 2023-01-17 ENCOUNTER — Encounter: Payer: Self-pay | Admitting: Hematology

## 2023-01-17 ENCOUNTER — Other Ambulatory Visit: Payer: Self-pay

## 2023-01-17 ENCOUNTER — Inpatient Hospital Stay (HOSPITAL_BASED_OUTPATIENT_CLINIC_OR_DEPARTMENT_OTHER): Payer: BC Managed Care – PPO | Admitting: Hematology

## 2023-01-17 VITALS — BP 120/66 | HR 78 | Temp 97.9°F | Resp 18 | Ht 62.0 in | Wt 127.2 lb

## 2023-01-17 DIAGNOSIS — C250 Malignant neoplasm of head of pancreas: Secondary | ICD-10-CM

## 2023-01-17 LAB — CANCER ANTIGEN 19-9: CA 19-9: 6 U/mL (ref 0–35)

## 2023-02-26 ENCOUNTER — Encounter: Payer: Self-pay | Admitting: Hematology

## 2023-03-21 ENCOUNTER — Other Ambulatory Visit: Payer: Self-pay | Admitting: Nurse Practitioner

## 2023-05-15 NOTE — Progress Notes (Unsigned)
Patient Care Team: Leola Brazil, DO as PCP - General (Internal Medicine) Malachy Mood, MD as Consulting Physician (Oncology) Fritzi Mandes, MD as Consulting Physician (General Surgery)   CHIEF COMPLAINT: Follow up pancreatic cancer   Oncology History Overview Note  Cancer Staging Pancreatic cancer [facility]) Staging form: Exocrine Pancreas, AJCC 8th Edition - Clinical stage from 08/24/2020: Stage IIB (cT2, cN1, cM0) - Signed by Malachy Mood, MD on 08/30/2020 Stage prefix: Initial diagnosis    Pancreatic cancer (HCC)  08/20/2020 Imaging   CT AP 08/20/20  IMPRESSION: 1. 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 Collateral circulation is noted. There is moderate intrahepatic and extrahepatic biliary dilatation which appears to be due to the pancreatic head mass. Pancreatic ductal dilatation is noted as well. 2. Probable 2.7 cm uterine fibroid. 3. Aortic atherosclerosis.   Aortic Atherosclerosis (ICD10-I70.0).   08/20/2020 Tumor Marker   Ca19-9 - 927   08/22/2020 Procedure   ERCP by Dr Ewing Schlein 08/22/20  IMPRESSION - The major papilla appeared normal. - A biliary sphincterotomy was performed. - Cells for cytology obtained in the lower third and middle of the main duct. - One plastic stent was placed into the common bile duct.   FINAL MICROSCOPIC DIAGNOSIS:  - No malignant cells identified  - Benign reactive/reparative changes   08/24/2020 Cancer Staging   Staging form: Exocrine Pancreas, AJCC 8th Edition - Clinical stage from 08/24/2020: Stage IIB (cT2, cN1, cM0) - Signed by Malachy Mood, MD on 08/30/2020   08/24/2020 Procedure   EUS by Dr Dulce Sellar 08/24/20  IMPRESSION - There was no sign of significant pathology in the ampulla. - A few malignant-appearing lymph nodes were visualized in the  peripancreatic region and porta hepatis region. - One stent was visualized endosonographically in the common bile duct. - A mass was identified in the pancreatic head. Tissue was obtained from this exam. The preliminary diagnosis is consistent with adenocarcinoma. Invasion into SMV/PV seen. Lymphadenopathy noted. This was staged T3 N1 Mx by endosonographic criteria. Fine needle aspiration performed.   08/24/2020 Initial Biopsy   FINAL MICROSCOPIC DIAGNOSIS: 08/24/20 - Malignant cells consistent with adenocarcinoma    08/30/2020 Initial Diagnosis   Pancreatic cancer (HCC)   09/05/2020 Imaging   CT Chest  IMPRESSION: 1. Stable 2.7 cm infiltrating pancreatic head mass with borderline enlarged peripancreatic lymph nodes. 2. No findings for pulmonary metastatic disease. 3. Small hiatal hernia. 4. Aortic atherosclerosis.   Aortic Atherosclerosis (ICD10-I70.0).   09/08/2020 Procedure   INSERTION PORT-A-CATH by Dr Freida Busman and Donell Beers    09/12/2020 - 12/29/2020 Chemotherapy   Neoadjuvant FOLFIRINOX q2 weeks starting 09/12/20-12/29/20    12/02/2020 Imaging   CT CAP  IMPRESSION: 1. Previously noted mass of the central pancreatic head is almost entirely resolved, difficult to discretely appreciate on current examination. Findings are consistent with treatment response. There remains obstruction of the pancreatic duct near the head neck junction with mild prominence of the pancreatic duct, measuring up to 5 mm, with atrophy of the distal pancreatic parenchyma. 2. The portal vein, splenic vein, and superior mesenteric vein are now widely patent, previously effaced at the confluence by mass effect. 3. Interval placement of common bile duct stent, tip positioned in the distal duodenum, with relief of previously seen biliary ductal dilatation. 4. 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 Following the bifurcation of the celiac mesenteric trunk, the celiac axis appears to directly traverse the vicinity of the mass, although there does appear to be a fat plane about the vessel. 5. The distal small bowel and colon are diffusely somewhat hyperenhancing and inflamed appearing with vascular combing and a tethered appearance of the distal small bowel. This appearance generally suggests inflammatory bowel disease such as Crohn's disease. Correlate with referable clinical history, if present. No evidence of obstruction or other acute complication. 6. Hepatic steatosis. 7. Aortic atherosclerosis.   12/28/2020 Genetic Testing   Negative hereditary cancer genetic testing: no pathogenic variants detected in [employer] Common Hereditary Cancers Panel.  The report date is December 28, 2020.    The Common Hereditary Cancers Panel offered by [employer] includes sequencing and/or deletion duplication testing of the following 47 genes: APC, ATM, AXIN2, BARD1, BMPR1A, BRCA1, BRCA2, BRIP1, CDH1, CDK4, CDKN2A (p14ARF), CDKN2A (p16INK4a), CHEK2, CTNNA1, DICER1, EPCAM (Deletion/duplication testing only), GREM1 (promoter region deletion/duplication testing only), GREM1, HOXB13, KIT, MEN1, MLH1, MSH2, MSH3, MSH6, MUTYH, NBN, NF1, NHTL1, PALB2, PDGFRA, PMS2, POLD1, POLE, PTEN, RAD50, RAD51C, RAD51D, SDHA, SDHB, SDHC, SDHD, SMAD4, SMARCA4. STK11, TP53, TSC1, TSC2, and VHL.  The following genes were evaluated for sequence changes only: SDHA and HOXB13 c.251G>A variant only.   01/17/2021 Imaging   CT CAP from [facility] IMPRESSION Small lesion in the pancreatic head measuring approximately 1.1 x 0.8 cm without evidence of vascular involvement or metastasis consistent with previously described, biopsy-proven pancreatic adenocarcinoma.   02/08/2021 Surgery   Whipple Surgery with Dr Deveron Furlong  Final Pathologic Diagnosis      A.  GALLBLADDER,  CHOLECYSTECTOMY: Chronic cholecystitis. No malignancy identified.   B.  BILE DUCT STENT, REMOVAL (GROSS ONLY DIAGNOSIS): Stent, see gross description.   C.  WHIPPLE RESECTION: No residual malignancy identified. Chronic and acute inflammation with granulation tissue reaction, fibrosis and features of chronic pancreatitis, suggestive of therapy effect. Fourteen lymph nodes, negative for metastasis (0/14). Margins negative for malignancy.       06/07/2021 Imaging   CT AP  IMPRESSION: 1. No current findings of recurrent malignancy. Interval Whipple procedure with expected postoperative findings. 2. A 3.5 cm stent is present in the dorsal pancreatic duct. No duct dilatation. There is an approximately 1.5 mm lucency centrally along this stent shown on image 43 series 7, possibilities include stent fracture, two separate stents, or an intentional radial lucency in this type of stent. 3. Small type 1 hiatal hernia. Mild distal esophageal wall thickening may reflect low-grade esophagitis. 4.  Aortic Atherosclerosis (ICD10-I70.0). 5.  Prominent stool throughout the colon favors constipation. 6. Uterine fibroid. 7. 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   Port-A-Cath in place     CURRENT THERAPY: Surveillance   INTERVAL HISTORY Ms. Cardona returns for follow up as scheduled. Last seen by Dr. Mosetta Putt 01/17/23.  Feeling "awesome" with no changes in her overall health.  Eating and drinking well with good energy.  Stools are occasionally loose if she misses Creon.  Denies N/V, abdominal pain or bloating, signs of jaundice.  ROS  All other systems reviewed and negative  Past Medical History:  Diagnosis Date   Anxiety    Cancer (HCC) 07/2020   pancreatic cancer   Depression    Diabetes mellitus without complication (HCC) 07/2020   pancreatic cancer  High cholesterol    History of blood transfusion    History of hiatal hernia    small    Hypertension    Stroke [facility])    age 40, no residual effect     Past Surgical History:  Procedure Laterality Date   BILIARY BRUSHING  08/22/2020   Procedure: BILIARY BRUSHING;  Surgeon: Vida Rigger, MD;  Location: [facility];  Service: Endoscopy;;   BILIARY STENT PLACEMENT N/A 08/22/2020   Procedure: BILIARY STENT PLACEMENT;  Surgeon: Vida Rigger, MD;  Location: [facility];  Service: Endoscopy;  Laterality: N/A;   ENDOSCOPIC RETROGRADE CHOLANGIOPANCREATOGRAPHY (ERCP) WITH PROPOFOL N/A 08/22/2020   Procedure: ENDOSCOPIC RETROGRADE CHOLANGIOPANCREATOGRAPHY (ERCP) WITH PROPOFOL;  Surgeon: Vida Rigger, MD;  Location: [facility];  Service: Endoscopy;  Laterality: N/A;   ESOPHAGOGASTRODUODENOSCOPY (EGD) WITH PROPOFOL N/A 08/24/2020   Procedure: ESOPHAGOGASTRODUODENOSCOPY (EGD) WITH PROPOFOL;  Surgeon: Willis Modena, MD;  Location: [facility];  Service: Endoscopy;  Laterality: N/A;   FINE NEEDLE ASPIRATION N/A 08/24/2020   Procedure: FINE NEEDLE ASPIRATION (FNA) LINEAR;  Surgeon: Willis Modena, MD;  Location: [facility];  Service: Endoscopy;  Laterality: N/A;   PORT-A-CATH REMOVAL N/A 12/08/2021   Procedure: REMOVAL PORT-A-CATH;  Surgeon: Fritzi Mandes, MD;  Location: [facility] OR;  Service: General;  Laterality: N/A;   PORTACATH PLACEMENT Right 09/08/2020   Procedure: INSERTION PORT-A-CATH;  Surgeon: Fritzi Mandes, MD;  Location: [facility];  Service: General;  Laterality: Right;   SPHINCTEROTOMY  08/22/2020   Procedure: SPHINCTEROTOMY;  Surgeon: Vida Rigger, MD;  Location: [facility];  Service: Endoscopy;;   UPPER ESOPHAGEAL ENDOSCOPIC ULTRASOUND (EUS) N/A 08/24/2020   Procedure: UPPER ESOPHAGEAL ENDOSCOPIC ULTRASOUND (EUS);  Surgeon: Willis Modena, MD;  Location: [facility];  Service: Endoscopy;  Laterality: N/A;     Outpatient Encounter Medications as of 05/16/2023  Medication Sig   acetaminophen (TYLENOL) 325 MG tablet Take 650 mg by mouth every 6 (six) hours as  needed (pain).   citalopram (CELEXA) 40 MG tablet Take 40 mg by mouth every evening.   hydrochlorothiazide (MICROZIDE) 12.5 MG capsule Take 12.5 mg by mouth in the morning.   lipase/protease/amylase (CREON) 36000 UNITS CPEP capsule TAKE 2 CAPSULES BY MOUTH 3 TIMES A DAY WITH A MEAL. MAY ALSO TAKE 1 CAPSULE AS NEEDED WITH SNACKS   magnesium oxide (MAG-OX) 400 (241.3 Mg) MG tablet Take 1 tablet (400 mg total) by mouth 2 (two) times daily. (Patient taking differently: Take 400 mg by mouth every evening.)   potassium chloride (KLOR-CON) 10 MEQ tablet TAKE 1 TABLET BY MOUTH EVERY DAY   simvastatin (ZOCOR) 80 MG tablet Take 80 mg by mouth every evening.   [DISCONTINUED] doxycycline (VIBRA-TABS) 100 MG tablet Take 1 tablet (100 mg total) by mouth 2 (two) times daily.   [DISCONTINUED] prochlorperazine (COMPAZINE) 10 MG tablet Take 1 tablet (10 mg total) by mouth every 6 (six) hours as needed (Nausea or vomiting).   No facility-administered encounter medications on file as of 05/16/2023.     Today's Vitals   05/16/23 0836 05/16/23 0842  BP: 114/68   Pulse: 62   Resp: 16   Temp: 98.2 F (36.8 C)   TempSrc: Oral   SpO2: 98%   Weight: 128 lb 11.2 oz (58.4 kg)   PainSc:  0-No pain   Body mass index is 23.54 kg/m.   PHYSICAL EXAM GENERAL:alert, no distress and comfortable SKIN: no rash  EYES: sclera clear NECK: without mass LYMPH:  no palpable cervical or supraclavicular lymphadenopathy  LUNGS:  clear with normal breathing effort HEART: regular rate & rhythm, no lower extremity edema ABDOMEN: abdomen soft, non-tender and normal bowel sounds.  Midline incision healed with no nodularity, except 1 small scar tissue focus at the navel NEURO: alert & oriented x 3 with fluent speech  CBC    Component Value Date/Time   WBC 6.8 05/16/2023 0820   RBC 4.68 05/16/2023 0820   HGB 13.3 05/16/2023 0820   HGB 12.3 05/15/2022 0739   HCT 40.5 05/16/2023 0820   PLT 291 05/16/2023 0820   PLT 275  05/15/2022 0739   MCV 86.5 05/16/2023 0820   MCH 28.4 05/16/2023 0820   MCHC 32.8 05/16/2023 0820   RDW 13.6 05/16/2023 0820   LYMPHSABS 1.6 05/16/2023 0820   MONOABS 0.6 05/16/2023 0820   EOSABS 0.2 05/16/2023 0820   BASOSABS 0.0 05/16/2023 0820     CMP     Component Value Date/Time   NA 143 05/16/2023 0820   K 3.6 05/16/2023 0820   CL 105 05/16/2023 0820   CO2 31 05/16/2023 0820   GLUCOSE 88 05/16/2023 0820   BUN 10 05/16/2023 0820   CREATININE 0.86 05/16/2023 0820   CREATININE 0.87 05/15/2022 0739   CALCIUM 9.2 05/16/2023 0820   PROT 6.9 05/16/2023 0820   ALBUMIN 4.0 05/16/2023 0820   AST 32 05/16/2023 0820   AST 39 05/15/2022 0739   ALT 37 05/16/2023 0820   ALT 42 05/15/2022 0739   ALKPHOS 86 05/16/2023 0820   BILITOT 1.5 (H) 05/16/2023 0820   BILITOT 1.2 05/15/2022 0739   GFRNONAA >60 05/16/2023 0820   GFRNONAA >60 05/15/2022 0739   GFRAA >60 08/23/2020 0606     ASSESSMENT & PLAN: TYANN NIEHAUS is a 62 y.o. female with    1. 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 staging was negative for metastatic disease.  -Given the vascular invasion, this was borderline resectable. She completed neoadjuvant chemo FOLFIRINOX q2 weeks 09/12/20 - 12/29/20 to downstage her disease.  -She proceeded with whipple surgery by Dr Flonnie Hailstone on 02/08/21. Her surgical path showed no residual disease. She overall had complete response. Adjuvant chemo was not recommended  -Surveillance CTs as of 12/2022 have showed NED, plan for next surveillance scan 12/2023   - Ms. Yadao is clinically doing well, exam is benign.  Labs are unremarkable except T. bili 1.5, which has been 0.5-1.4 in the past, overall stable.  CA 19-9 is pending -No  clinical concern for recurrence.  Continue surveillance -Follow-up in 4 months, or sooner if needed   2.  Health and wellness -I encouraged her to continue healthy active lifestyle, with exercise, healthy diet, no smoking, alcohol, and stay up-to-date on age-appropriate cancer screenings   PLAN: -Labs reviewed, CA 19-9 is pending -Continue pancreatic cancer surveillance -Follow-up in 4 months, will order next surveillance CT at that time (to be done 12/2023) -She knows to call sooner if needed    All questions were answered. The patient knows to call the clinic with any problems, questions or concerns. No barriers to learning were detected.   Santiago Glad, NP-C 05/16/2023

## 2023-05-16 ENCOUNTER — Inpatient Hospital Stay: Payer: No Typology Code available for payment source | Attending: Nurse Practitioner

## 2023-05-16 ENCOUNTER — Inpatient Hospital Stay: Payer: No Typology Code available for payment source | Admitting: Nurse Practitioner

## 2023-05-16 ENCOUNTER — Encounter: Payer: Self-pay | Admitting: Hematology

## 2023-05-16 ENCOUNTER — Other Ambulatory Visit: Payer: Self-pay

## 2023-05-16 ENCOUNTER — Encounter: Payer: Self-pay | Admitting: Nurse Practitioner

## 2023-05-16 VITALS — BP 114/68 | HR 62 | Temp 98.2°F | Resp 16 | Wt 128.7 lb

## 2023-05-16 DIAGNOSIS — Z8507 Personal history of malignant neoplasm of pancreas: Secondary | ICD-10-CM | POA: Insufficient documentation

## 2023-05-16 DIAGNOSIS — C250 Malignant neoplasm of head of pancreas: Secondary | ICD-10-CM | POA: Diagnosis not present

## 2023-05-16 LAB — CBC WITH DIFFERENTIAL/PLATELET
Abs Immature Granulocytes: 0.01 10*3/uL (ref 0.00–0.07)
Basophils Absolute: 0 10*3/uL (ref 0.0–0.1)
Basophils Relative: 0 %
Eosinophils Absolute: 0.2 10*3/uL (ref 0.0–0.5)
Eosinophils Relative: 2 %
HCT: 40.5 % (ref 36.0–46.0)
Hemoglobin: 13.3 g/dL (ref 12.0–15.0)
Immature Granulocytes: 0 %
Lymphocytes Relative: 24 %
Lymphs Abs: 1.6 10*3/uL (ref 0.7–4.0)
MCH: 28.4 pg (ref 26.0–34.0)
MCHC: 32.8 g/dL (ref 30.0–36.0)
MCV: 86.5 fL (ref 80.0–100.0)
Monocytes Absolute: 0.6 10*3/uL (ref 0.1–1.0)
Monocytes Relative: 9 %
Neutro Abs: 4.4 10*3/uL (ref 1.7–7.7)
Neutrophils Relative %: 65 %
Platelets: 291 10*3/uL (ref 150–400)
RBC: 4.68 MIL/uL (ref 3.87–5.11)
RDW: 13.6 % (ref 11.5–15.5)
WBC: 6.8 10*3/uL (ref 4.0–10.5)
nRBC: 0 % (ref 0.0–0.2)

## 2023-05-16 LAB — COMPREHENSIVE METABOLIC PANEL
ALT: 37 U/L (ref 0–44)
AST: 32 U/L (ref 15–41)
Albumin: 4 g/dL (ref 3.5–5.0)
Alkaline Phosphatase: 86 U/L (ref 38–126)
Anion gap: 7 (ref 5–15)
BUN: 10 mg/dL (ref 8–23)
CO2: 31 mmol/L (ref 22–32)
Calcium: 9.2 mg/dL (ref 8.9–10.3)
Chloride: 105 mmol/L (ref 98–111)
Creatinine, Ser: 0.86 mg/dL (ref 0.44–1.00)
GFR, Estimated: >60 mL/min (ref >60)
Glucose, Bld: 88 mg/dL (ref 70–99)
Potassium: 3.6 mmol/L (ref 3.5–5.1)
Sodium: 143 mmol/L (ref 135–145)
Total Bilirubin: 1.5 mg/dL — ABNORMAL HIGH (ref 0.3–1.2)
Total Protein: 6.9 g/dL (ref 6.5–8.1)

## 2023-05-17 LAB — CANCER ANTIGEN 19-9: CA 19-9: 11 U/mL (ref 0–35)

## 2023-09-11 ENCOUNTER — Other Ambulatory Visit: Payer: Self-pay

## 2023-09-11 DIAGNOSIS — C250 Malignant neoplasm of head of pancreas: Secondary | ICD-10-CM

## 2023-09-11 NOTE — Assessment & Plan Note (Addendum)
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 staging was negative for metastatic disease.  -Given the vascular invasion, this was borderline resectable. She completed neoadjuvant chemo FOLFIRINOX q2 weeks 09/12/20 - 12/29/20 to downstage her disease.  -She proceeded with whipple surgery by Dr Flonnie Hailstone on 02/08/21. Her surgical path showed no residual disease. She overall had complete response. Adjuvant chemo was not recommended  -Surveillance CTs as of 12/2022 have showed NED, plan for next surveillance scan 12/2023   - Ms. Cherry is clinically doing well, exam is benign.  Labs are unremarkable except T. bili 1.5, which has been 0.5-1.4 in the past, overall stable.   -most recent CA 19-9 done 05/16/2023 and was normal at 11.  If CA 19-9 continues to be normal, will repeat labs with surveillance scan in 4 months with follow up. If CA 19-9 elevated, will repeat labs in 2 to [redacted] week along with surveillance scan and follow up in 4 to 6 weeks.  -for now, continue surveillance

## 2023-09-11 NOTE — Progress Notes (Unsigned)
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 staging was negative for metastatic disease.  -Given the vascular invasion, this was borderline resectable. She completed neoadjuvant chemo FOLFIRINOX q2 weeks 09/12/20 - 12/29/20 to downstage her disease.  -She proceeded with whipple surgery by Dr Flonnie Hailstone on 02/08/21. Her surgical path showed no residual disease. She overall had complete response. Adjuvant chemo was not recommended  -Surveillance CTs as of 12/2022 have showed NED, plan for next surveillance scan 12/2023   - Jacqueline Tyler is clinically doing well, exam is benign.  Labs are unremarkable except T. bili 1.5, which has been 0.5-1.4 in the past, overall stable.   -most recent CA 19-9 done 05/16/2023 and was normal at 11.  If CA 19-9 continues to be normal, will repeat labs with surveillance scan in 4 months with follow up. If CA 19-9 elevated, will repeat labs in 2 to [redacted] week along with surveillance scan and follow up in 4 to 6 weeks.  -for now, continue surveillance   Plan: Labs reviewed  -CBC showing WBC 6.5; Hgb 12.7; Hct 39.0; Plt 251; Anc 3.9 -CMP - K 4.0; glucose 94; BUN 7; Creatinine 0.98; eGFR >60; Ca 9.1; AST 62; ALT 108; t.bili 1.3.   -CA 19-9 pending.  --recheck of labs and new surveillance CT to be scheduled based on CA 19-9 results.  Will message patient with results when available along with plan for  follow up. She voiced understanding.  The patient understands the plans discussed today and is in agreement with them.  She knows to contact our office if she develops concerns prior to her next appointment.  I provided 30 minutes of face-to-face time during this encounter and > 50% was spent counseling as documented under my assessment and plan.    Jacqueline Jews, NP  [facility] [address] [place] [address] Dept: [phone] Dept Fax: [phone]   Orders Placed This Encounter  Procedures   CT CHEST ABDOMEN PELVIS W CONTRAST    Standing Status:   Future    Standing Expiration Date:   09/11/2024    Order Specific Question:   If indicated for the ordered procedure, I authorize the administration of contrast media per Radiology protocol    Answer:   Yes    Order Specific Question:   Does the patient have a contrast media/X-ray dye allergy?    Answer:   No    Order Specific Question:   Preferred imaging location?    Answer:   [facility]    Order Specific Question:   If indicated for the ordered procedure, I authorize the administration of oral contrast media per Radiology protocol    Answer:   Yes      CHIEF COMPLAINT:  CC: malignant neoplasm of head of pancreas  Current Treatment:  surveillance  INTERVAL HISTORY:  Jacqueline Tyler is here today for repeat clinical assessment. She was last seen  by Clayborn Heron, NP on 05/16/2023. Has been doing well in general. She denies fevers or chills. She denies pain. Her appetite is good. She has noted development of umbilical hernia which is getting larger. States that it is tender at times. This is generally not tender.  "Pooches" out with straining and exertion.  Of note, her liver functions are elevated today. She states that her pcp noted the same thing earlier this month during routine physical. Her weight has increased 3 pounds over last 6 months .  I have  reviewed the past medical history, past surgical history, social history and family history with the patient and they are unchanged from previous note.  ALLERGIES:  is allergic to tyloxapol, oxycodone-acetaminophen, and poison ivy extract.  MEDICATIONS:  Current Outpatient Medications  Medication Sig Dispense Refill   acetaminophen (TYLENOL) 325 MG tablet Take 650 mg by mouth every 6 (six) hours as needed (pain).     citalopram (CELEXA) 40 MG tablet Take 40 mg by mouth every evening.     hydrochlorothiazide (MICROZIDE) 12.5 MG capsule Take 12.5 mg by mouth in the morning.     lipase/protease/amylase (CREON) 36000 UNITS CPEP capsule TAKE 2 CAPSULES BY MOUTH 3 TIMES A DAY WITH A MEAL. MAY ALSO TAKE 1 CAPSULE AS NEEDED WITH SNACKS 240 capsule 5   magnesium oxide (MAG-OX) 400 (241.3 Mg) MG tablet Take 1 tablet (400 mg total) by mouth 2 (two) times daily. (Patient taking differently: Take 400 mg by mouth every evening.) 60 tablet 0   potassium chloride (KLOR-CON) 10 MEQ tablet TAKE 1 TABLET BY MOUTH EVERY DAY 90 tablet 1   simvastatin (ZOCOR) 80 MG tablet Take 80 mg by mouth every evening.     No current facility-administered medications for this visit.    HISTORY OF PRESENT ILLNESS:   Oncology History Overview Note  Cancer Staging Pancreatic cancer [facility]) Staging form: Exocrine Pancreas, AJCC 8th Edition - Clinical stage from 08/24/2020: Stage IIB (cT2, cN1, cM0) - Signed by Malachy Mood, MD on 08/30/2020 Stage prefix: Initial diagnosis    Pancreatic cancer (HCC)  08/20/2020 Imaging   CT AP 08/20/20  IMPRESSION: 1. 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 Collateral circulation is noted. There is moderate intrahepatic and extrahepatic biliary dilatation which appears to be due to the pancreatic head mass.  Pancreatic ductal dilatation is noted as well. 2. Probable 2.7 cm uterine fibroid. 3. Aortic atherosclerosis.   Aortic Atherosclerosis (ICD10-I70.0).   08/20/2020 Tumor Marker   Ca19-9 - 927   08/22/2020 Procedure   ERCP by Dr Ewing Schlein 08/22/20  IMPRESSION - The major papilla appeared normal. - A biliary sphincterotomy was performed. - Cells for cytology obtained in the lower third and middle of the main duct. - One plastic stent was placed into the common bile duct.   FINAL MICROSCOPIC DIAGNOSIS:  - No malignant cells identified  - Benign reactive/reparative changes   08/24/2020 Cancer Staging   Staging form: Exocrine Pancreas, AJCC 8th Edition - Clinical stage from 08/24/2020: Stage IIB (cT2, cN1, cM0) - Signed by Malachy Mood, MD on 08/30/2020   08/24/2020 Procedure   EUS by Dr Dulce Sellar 08/24/20  IMPRESSION - There was no sign of significant pathology in the ampulla. - A few malignant-appearing lymph nodes were visualized in the peripancreatic region and porta hepatis region. -  One stent was visualized endosonographically in the common bile duct. - A mass was identified in the pancreatic head. Tissue was obtained from this exam. The preliminary diagnosis is consistent with adenocarcinoma. Invasion into SMV/PV seen. Lymphadenopathy noted. This was staged T3 N1 Mx by endosonographic criteria. Fine needle aspiration performed.   08/24/2020 Initial Biopsy   FINAL MICROSCOPIC DIAGNOSIS: 08/24/20 - Malignant cells consistent with adenocarcinoma    08/30/2020 Initial Diagnosis   Pancreatic cancer (HCC)   09/05/2020 Imaging   CT Chest  IMPRESSION: 1. Stable 2.7 cm infiltrating pancreatic head mass with borderline enlarged peripancreatic lymph nodes. 2. No findings for pulmonary metastatic disease. 3. Small hiatal hernia. 4. Aortic atherosclerosis.   Aortic Atherosclerosis (ICD10-I70.0).   09/08/2020 Procedure   INSERTION PORT-A-CATH by Dr Freida Busman and Donell Beers    09/12/2020 -  12/29/2020 Chemotherapy   Neoadjuvant FOLFIRINOX q2 weeks starting 09/12/20-12/29/20    12/02/2020 Imaging   CT CAP  IMPRESSION: 1. Previously noted mass of the central pancreatic head is almost entirely resolved, difficult to discretely appreciate on current examination. Findings are consistent with treatment response. There remains obstruction of the pancreatic duct near the head neck junction with mild prominence of the pancreatic duct, measuring up to 5 mm, with atrophy of the distal pancreatic parenchyma. 2. The portal vein, splenic vein, and superior mesenteric vein are now widely patent, previously effaced at the confluence by mass effect. 3. Interval placement of common bile duct stent, tip positioned in the distal duodenum, with relief of previously seen biliary ductal dilatation. 4. 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 Following the bifurcation of the celiac mesenteric trunk, the celiac axis appears to directly traverse the vicinity of the mass, although there does appear to be a fat plane about the vessel. 5. The distal small bowel and colon are diffusely somewhat hyperenhancing and inflamed appearing with vascular combing and a tethered appearance of the distal small bowel. This appearance generally suggests inflammatory bowel disease such as Crohn's disease. Correlate with referable clinical history, if present. No evidence of obstruction or other acute complication. 6. Hepatic steatosis. 7. Aortic atherosclerosis.   12/28/2020 Genetic Testing   Negative hereditary cancer genetic testing: no pathogenic variants detected in [employer] Common Hereditary Cancers Panel.  The report date is December 28, 2020.    The Common Hereditary Cancers Panel offered by [employer] includes sequencing and/or deletion duplication  testing of the following 47 genes: APC, ATM, AXIN2, BARD1, BMPR1A, BRCA1, BRCA2, BRIP1, CDH1, CDK4, CDKN2A (p14ARF), CDKN2A (p16INK4a), CHEK2, CTNNA1, DICER1, EPCAM (Deletion/duplication testing only), GREM1 (promoter region deletion/duplication testing only), GREM1, HOXB13, KIT, MEN1, MLH1, MSH2, MSH3, MSH6, MUTYH, NBN, NF1, NHTL1, PALB2, PDGFRA, PMS2, POLD1, POLE, PTEN, RAD50, RAD51C, RAD51D, SDHA, SDHB, SDHC, SDHD, SMAD4, SMARCA4. STK11, TP53, TSC1, TSC2, and VHL.  The following genes were evaluated for sequence changes only: SDHA and HOXB13 c.251G>A variant only.   01/17/2021 Imaging   CT CAP from [facility] IMPRESSION Small lesion in the pancreatic head measuring approximately 1.1 x 0.8 cm without evidence of vascular involvement or metastasis consistent with previously described, biopsy-proven pancreatic adenocarcinoma.   02/08/2021 Surgery   Whipple Surgery with Dr Deveron Furlong  Final Pathologic Diagnosis      A.  GALLBLADDER, CHOLECYSTECTOMY: Chronic cholecystitis. No malignancy identified.   B.  BILE DUCT STENT, REMOVAL (GROSS ONLY DIAGNOSIS): Stent, see gross description.   C.  WHIPPLE RESECTION: No residual malignancy identified. Chronic and acute inflammation with granulation tissue reaction, fibrosis and features of chronic pancreatitis, suggestive of therapy effect. Fourteen lymph nodes, negative for metastasis (0/14). Margins negative for malignancy.        06/07/2021 Imaging   CT AP  IMPRESSION: 1. No current findings of recurrent malignancy. Interval Whipple procedure with expected postoperative findings. 2. A 3.5 cm stent is present in the dorsal pancreatic duct. No duct dilatation. There is an approximately 1.5 mm lucency centrally along this stent shown on image 43 series 7, possibilities include stent fracture, two separate stents, or an intentional radial lucency in this type of stent. 3. Small type 1 hiatal hernia. Mild distal esophageal wall thickening may reflect  low-grade esophagitis. 4.  Aortic Atherosclerosis (ICD10-I70.0). 5.  Prominent stool throughout the colon favors constipation. 6. Uterine fibroid. 7. 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   Port-A-Cath in place      REVIEW OF SYSTEMS:   Constitutional: Denies fevers, chills or abnormal weight loss Eyes: Denies blurriness of vision Ears, nose, mouth, throat, and face: Denies mucositis or sore throat Respiratory: Denies cough, dyspnea or wheezes Cardiovascular: Denies palpitation, chest discomfort or lower extremity swelling Gastrointestinal:  Denies nausea, heartburn or change in bowel habits. Has noted enlarging umbilical hernia. Sometimes this is tender Skin: Denies abnormal skin rashes Lymphatics: Denies new lymphadenopathy or easy bruising Neurological:Denies numbness, tingling or new weaknesses Behavioral/Psych: Mood is stable, no new changes  All other systems were reviewed with the patient and are negative.   VITALS:   Today's Vitals   09/12/23 0823  BP: 111/63  Pulse: 60  Resp: 16  Temp: 97.8 F (36.6 C)  TempSrc: Oral  SpO2: 99%  Weight: 131 lb 2 oz (59.5 kg)   Body mass index is 23.98 kg/m.   Wt Readings from Last 3 Encounters:  09/12/23 131 lb 2 oz (59.5 kg)  05/16/23 128 lb 11.2 oz (58.4 kg)  01/17/23 127 lb 3.2 oz (57.7 kg)    Body mass index is 23.98 kg/m.  Performance status (ECOG): 1 - Symptomatic but completely ambulatory  PHYSICAL EXAM:   GENERAL:alert, no distress and comfortable SKIN: skin color, texture, turgor are normal, no rashes or significant lesions EYES: normal, Conjunctiva are pink and non-injected, sclera clear OROPHARYNX:no exudate, no erythema and lips, buccal mucosa, and tongue normal  NECK: supple, thyroid normal size, non-tender, without nodularity LYMPH:  no palpable lymphadenopathy in the cervical, axillary or inguinal LUNGS: clear to auscultation and percussion with  normal breathing effort HEART: regular rate & rhythm and no murmurs and no lower extremity edema ABDOMEN:abdomen soft, non-tender and normal bowel sounds. There is reducible umbilical hernia noted with strain of abdominal muscles. Non tender with palpation.  Musculoskeletal:no cyanosis of digits and no clubbing  NEURO: alert & oriented x 3 with fluent speech, no focal motor/sensory deficits  LABORATORY DATA:  I have reviewed the data as listed    Component Value Date/Time   NA 143 09/12/2023 0815   K 4.0 09/12/2023 0815   CL 109 09/12/2023 0815   CO2 28 09/12/2023 0815   GLUCOSE 94 09/12/2023 0815   BUN 7 (L) 09/12/2023 0815   CREATININE 0.98 09/12/2023 0815   CALCIUM 9.1 09/12/2023 0815   PROT 6.6 09/12/2023 0815   ALBUMIN 4.0 09/12/2023 0815   AST 62 (H) 09/12/2023 0815   ALT 108 (H) 09/12/2023 0815   ALKPHOS 107 09/12/2023 0815   BILITOT 1.3 (  H) 09/12/2023 0815   GFRNONAA >60 09/12/2023 0815   GFRAA >60 08/23/2020 0606     Lab Results  Component Value Date   WBC 6.5 09/12/2023   NEUTROABS 3.9 09/12/2023   HGB 12.7 09/12/2023   HCT 39.0 09/12/2023   MCV 87.8 09/12/2023   PLT 251 09/12/2023   Addendum I have seen the patient, examined her. I agree with the assessment and and plan and have edited the notes.   Jacqueline Tyler is clinically doing well, lab reviewed, she has new mild transaminitis.  Her tumor marker still pending, if it is elevated, will obtain restaging CT scan in the next few weeks.  If CA 19.9 is normal, we will repeat her LFT in a month, and plan to repeat a CT scan in February 2025.  All questions were answered.  She is 3 years out from her initial diagnosis, her risk of recurrence is low now.  I spent a total of 25 minutes for her visit today, more than 50% time on face-to-face counseling.  Malachy Mood MD 09/12/2023

## 2023-09-12 ENCOUNTER — Other Ambulatory Visit: Payer: Self-pay

## 2023-09-12 ENCOUNTER — Inpatient Hospital Stay: Payer: No Typology Code available for payment source | Admitting: Nurse Practitioner

## 2023-09-12 ENCOUNTER — Ambulatory Visit: Payer: No Typology Code available for payment source | Admitting: Hematology

## 2023-09-12 ENCOUNTER — Other Ambulatory Visit: Payer: No Typology Code available for payment source

## 2023-09-12 ENCOUNTER — Inpatient Hospital Stay: Payer: No Typology Code available for payment source | Attending: Hematology

## 2023-09-12 VITALS — BP 111/63 | HR 60 | Temp 97.8°F | Resp 16 | Wt 131.1 lb

## 2023-09-12 DIAGNOSIS — C250 Malignant neoplasm of head of pancreas: Secondary | ICD-10-CM

## 2023-09-12 DIAGNOSIS — Z79899 Other long term (current) drug therapy: Secondary | ICD-10-CM | POA: Diagnosis not present

## 2023-09-12 DIAGNOSIS — R197 Diarrhea, unspecified: Secondary | ICD-10-CM | POA: Insufficient documentation

## 2023-09-12 LAB — CBC WITH DIFFERENTIAL (CANCER CENTER ONLY)
Abs Immature Granulocytes: 0.01 10*3/uL (ref 0.00–0.07)
Basophils Absolute: 0 10*3/uL (ref 0.0–0.1)
Basophils Relative: 1 %
Eosinophils Absolute: 0.2 10*3/uL (ref 0.0–0.5)
Eosinophils Relative: 2 %
HCT: 39 % (ref 36.0–46.0)
Hemoglobin: 12.7 g/dL (ref 12.0–15.0)
Immature Granulocytes: 0 %
Lymphocytes Relative: 27 %
Lymphs Abs: 1.7 10*3/uL (ref 0.7–4.0)
MCH: 28.6 pg (ref 26.0–34.0)
MCHC: 32.6 g/dL (ref 30.0–36.0)
MCV: 87.8 fL (ref 80.0–100.0)
Monocytes Absolute: 0.7 10*3/uL (ref 0.1–1.0)
Monocytes Relative: 10 %
Neutro Abs: 3.9 10*3/uL (ref 1.7–7.7)
Neutrophils Relative %: 60 %
Platelet Count: 251 10*3/uL (ref 150–400)
RBC: 4.44 MIL/uL (ref 3.87–5.11)
RDW: 13.7 % (ref 11.5–15.5)
WBC Count: 6.5 10*3/uL (ref 4.0–10.5)
nRBC: 0 % (ref 0.0–0.2)

## 2023-09-12 LAB — CMP (CANCER CENTER ONLY)
ALT: 108 U/L — ABNORMAL HIGH (ref 0–44)
AST: 62 U/L — ABNORMAL HIGH (ref 15–41)
Albumin: 4 g/dL (ref 3.5–5.0)
Alkaline Phosphatase: 107 U/L (ref 38–126)
Anion gap: 6 (ref 5–15)
BUN: 7 mg/dL — ABNORMAL LOW (ref 8–23)
CO2: 28 mmol/L (ref 22–32)
Calcium: 9.1 mg/dL (ref 8.9–10.3)
Chloride: 109 mmol/L (ref 98–111)
Creatinine: 0.98 mg/dL (ref 0.44–1.00)
GFR, Estimated: >60 mL/min (ref >60)
Glucose, Bld: 94 mg/dL (ref 70–99)
Potassium: 4 mmol/L (ref 3.5–5.1)
Sodium: 143 mmol/L (ref 135–145)
Total Bilirubin: 1.3 mg/dL — ABNORMAL HIGH (ref 0.3–1.2)
Total Protein: 6.6 g/dL (ref 6.5–8.1)

## 2023-09-13 ENCOUNTER — Other Ambulatory Visit: Payer: Self-pay | Admitting: Nurse Practitioner

## 2023-09-13 DIAGNOSIS — C250 Malignant neoplasm of head of pancreas: Secondary | ICD-10-CM

## 2023-09-13 LAB — CANCER ANTIGEN 19-9: CA 19-9: 48 U/mL — ABNORMAL HIGH (ref 0–35)

## 2023-09-17 ENCOUNTER — Ambulatory Visit (HOSPITAL_COMMUNITY)
Admission: RE | Admit: 2023-09-17 | Discharge: 2023-09-17 | Disposition: A | Discharge status: Home / Self Care | Payer: No Typology Code available for payment source | Source: Ambulatory Visit | Attending: Nurse Practitioner | Admitting: Nurse Practitioner

## 2023-09-17 DIAGNOSIS — C250 Malignant neoplasm of head of pancreas: Secondary | ICD-10-CM | POA: Diagnosis present

## 2023-09-17 MED ORDER — IOHEXOL 300 MG/ML  SOLN
100.0000 mL | Freq: Once | INTRAMUSCULAR | Status: AC | PRN
  Administered 2023-09-17: 100 mL via INTRAVENOUS

## 2023-09-17 NOTE — Assessment & Plan Note (Signed)
stage IIB, [name], [name] -Diagnosed 08/24/20 on EUS with Dr Dulce Sellar, which showed mass at head of pancreas, s/p stenting, with SMV and PV invasion, biopsy confirmed adenocarcinoma with few malignant-appearing LNs in peripancreatic and porta hepatis region. Staging was negative for metastatic disease.  -she completed neoadjuvant chemo FOLFIRINOX q2 weeks 09/12/20 - 12/29/20. -s/p whipple surgery by Dr Flonnie Hailstone on 02/08/21, path showed no residual disease.  -due to the high recurrence risk of pancreatic cancer, she is under close surveillance.

## 2023-09-18 ENCOUNTER — Other Ambulatory Visit: Payer: Self-pay

## 2023-09-18 ENCOUNTER — Other Ambulatory Visit: Payer: Self-pay | Admitting: Hematology

## 2023-09-18 ENCOUNTER — Telehealth: Payer: Self-pay

## 2023-09-18 ENCOUNTER — Encounter: Payer: Self-pay | Admitting: Hematology

## 2023-09-18 ENCOUNTER — Inpatient Hospital Stay (HOSPITAL_BASED_OUTPATIENT_CLINIC_OR_DEPARTMENT_OTHER): Payer: No Typology Code available for payment source | Admitting: Hematology

## 2023-09-18 ENCOUNTER — Inpatient Hospital Stay: Payer: No Typology Code available for payment source

## 2023-09-18 VITALS — BP 121/70 | HR 54 | Temp 98.0°F | Resp 18 | Ht 62.0 in | Wt 131.9 lb

## 2023-09-18 DIAGNOSIS — C250 Malignant neoplasm of head of pancreas: Secondary | ICD-10-CM

## 2023-09-18 LAB — CBC WITH DIFFERENTIAL (CANCER CENTER ONLY)
Abs Immature Granulocytes: 0.02 10*3/uL (ref 0.00–0.07)
Basophils Absolute: 0 10*3/uL (ref 0.0–0.1)
Basophils Relative: 1 %
Eosinophils Absolute: 0.1 10*3/uL (ref 0.0–0.5)
Eosinophils Relative: 2 %
HCT: 39 % (ref 36.0–46.0)
Hemoglobin: 12.5 g/dL (ref 12.0–15.0)
Immature Granulocytes: 0 %
Lymphocytes Relative: 30 %
Lymphs Abs: 1.9 10*3/uL (ref 0.7–4.0)
MCH: 28.3 pg (ref 26.0–34.0)
MCHC: 32.1 g/dL (ref 30.0–36.0)
MCV: 88.2 fL (ref 80.0–100.0)
Monocytes Absolute: 0.7 10*3/uL (ref 0.1–1.0)
Monocytes Relative: 11 %
Neutro Abs: 3.6 10*3/uL (ref 1.7–7.7)
Neutrophils Relative %: 56 %
Platelet Count: 262 10*3/uL (ref 150–400)
RBC: 4.42 MIL/uL (ref 3.87–5.11)
RDW: 13.9 % (ref 11.5–15.5)
WBC Count: 6.4 10*3/uL (ref 4.0–10.5)
nRBC: 0 % (ref 0.0–0.2)

## 2023-09-18 LAB — CMP (CANCER CENTER ONLY)
ALT: 67 U/L — ABNORMAL HIGH (ref 0–44)
AST: 41 U/L (ref 15–41)
Albumin: 3.8 g/dL (ref 3.5–5.0)
Alkaline Phosphatase: 89 U/L (ref 38–126)
Anion gap: 8 (ref 5–15)
BUN: 14 mg/dL (ref 8–23)
CO2: 28 mmol/L (ref 22–32)
Calcium: 8.9 mg/dL (ref 8.9–10.3)
Chloride: 107 mmol/L (ref 98–111)
Creatinine: 1.01 mg/dL — ABNORMAL HIGH (ref 0.44–1.00)
GFR, Estimated: >60 mL/min (ref >60)
Glucose, Bld: 97 mg/dL (ref 70–99)
Potassium: 3.8 mmol/L (ref 3.5–5.1)
Sodium: 143 mmol/L (ref 135–145)
Total Bilirubin: 0.8 mg/dL (ref 0.3–1.2)
Total Protein: 6.5 g/dL (ref 6.5–8.1)

## 2023-09-18 NOTE — Telephone Encounter (Signed)
Report called on CT Scan Results.  Pt was seen by Dr. Mosetta Putt today while in clinic.  IMPRESSION: Developing 2.1 x 1.6 cm mass at the margin of the residual pancreatitis worrisome for a area of recurrence or new area of neoplasm. Other soft tissue or node. This has a of the stump of the Whipple procedure.   Developing left upper lobe spiculated 12 mm lung nodule also worrisome for developing area of neoplastic disease. A dedicated PET-CT scan may be of benefit to further evaluate this lesion as clinically appropriate.   Fatty liver infiltration.  Small hiatal hernia.   Significant colonic stool.   Findings will be called to the ordering service by the Radiology physician assistant team.

## 2023-09-18 NOTE — Progress Notes (Signed)
[facility]   [phone] [phone]   Clinic Follow up Note   Patient Care Team: Leola Brazil, DO as PCP - General (Internal Medicine) Malachy Mood, MD as Consulting Physician (Oncology) Fritzi Mandes, MD as Consulting Physician (General Surgery)  Date of Service:  09/18/2023  CHIEF COMPLAINT: f/u of pancreatic cancer  CURRENT THERAPY:  Cancer surveillance  Oncology History   Pancreatic cancer [facility]) stage IIB, [name], [name] -Diagnosed 08/24/20 on EUS with Dr Dulce Sellar, which showed mass at head of pancreas, s/p stenting, with SMV and PV invasion, biopsy confirmed adenocarcinoma with few malignant-appearing LNs in peripancreatic and porta hepatis region. Staging was negative for metastatic disease.  -she completed neoadjuvant chemo FOLFIRINOX q2 weeks 09/12/20 - 12/29/20. -s/p whipple surgery by Dr Flonnie Hailstone on 02/08/21, path showed no residual disease.  -due to the high recurrence risk of pancreatic cancer, she is under close surveillance.    Assessment and Plan    Pancreatic Cancer Due to her recent elevated tumor marker, we obtained CT scan, which unfortunately showed a new 2 cm soft tissue mass at the previous surgical site and a small lung nodule on CT scan. Discussed the possibility of cancer recurrence and the need for further evaluation. -Order PET scan to further evaluate the findings on CT scan. -Consult with Dr. Dulce Sellar for possible EUS biopsy of the pancreatic mass. -Plan to discuss treatment options (chemotherapy, radiation) after PET scan and biopsy results.  Diarrhea Improved with Creon use. -Continue Creon with major meals.  General Health Maintenance -Refill Celexa through primary care physician. -Continue Potassium supplementation as directed by primary care physician.     Plan -Lab and CT scan reviewed, suspicious for cancer recurrence -PET scan in the next few weeks -Will reach out to Dr. Dulce Sellar to see if we can do EUS  biopsy of the soft tissue at the previous pancreatic resection site -f/u after the above workup     SUMMARY OF ONCOLOGIC HISTORY: Oncology History Overview Note  Cancer Staging Pancreatic cancer [facility]) Staging form: Exocrine Pancreas, AJCC 8th Edition - Clinical stage from 08/24/2020: Stage IIB (cT2, cN1, cM0) - Signed by Malachy Mood, MD on 08/30/2020 Stage prefix: Initial diagnosis    Pancreatic cancer (HCC)  08/20/2020 Imaging   CT AP 08/20/20  IMPRESSION: 1. 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 Collateral circulation is noted. There is moderate intrahepatic and extrahepatic biliary dilatation which appears to be due to the pancreatic head mass. Pancreatic ductal dilatation is noted as well. 2. Probable 2.7 cm uterine fibroid. 3. Aortic atherosclerosis.   Aortic Atherosclerosis (ICD10-I70.0).   08/20/2020 Tumor Marker   Ca19-9 - 927   08/22/2020 Procedure   ERCP by Dr Ewing Schlein 08/22/20  IMPRESSION - The major papilla appeared normal. - A biliary sphincterotomy was performed. - Cells for cytology obtained in the lower third and middle of the main duct. - One plastic stent was placed into the common bile duct.   FINAL MICROSCOPIC DIAGNOSIS:  - No malignant cells identified  - Benign reactive/reparative changes   08/24/2020 Cancer Staging   Staging form: Exocrine Pancreas, AJCC 8th Edition - Clinical stage from 08/24/2020: Stage IIB (cT2, cN1, cM0) - Signed by Malachy Mood, MD on 08/30/2020   08/24/2020 Procedure   EUS by Dr Dulce Sellar 08/24/20  IMPRESSION -  There was no sign of significant pathology in the ampulla. - A few malignant-appearing lymph nodes were visualized in the peripancreatic region and porta hepatis region. - One stent was visualized endosonographically in the common bile duct. - A  mass was identified in the pancreatic head. Tissue was obtained from this exam. The preliminary diagnosis is consistent with adenocarcinoma. Invasion into SMV/PV seen. Lymphadenopathy noted. This was staged T3 N1 Mx by endosonographic criteria. Fine needle aspiration performed.   08/24/2020 Initial Biopsy   FINAL MICROSCOPIC DIAGNOSIS: 08/24/20 - Malignant cells consistent with adenocarcinoma    08/30/2020 Initial Diagnosis   Pancreatic cancer (HCC)   09/05/2020 Imaging   CT Chest  IMPRESSION: 1. Stable 2.7 cm infiltrating pancreatic head mass with borderline enlarged peripancreatic lymph nodes. 2. No findings for pulmonary metastatic disease. 3. Small hiatal hernia. 4. Aortic atherosclerosis.   Aortic Atherosclerosis (ICD10-I70.0).   09/08/2020 Procedure   INSERTION PORT-A-CATH by Dr Freida Busman and Donell Beers    09/12/2020 - 12/29/2020 Chemotherapy   Neoadjuvant FOLFIRINOX q2 weeks starting 09/12/20-12/29/20    12/02/2020 Imaging   CT CAP  IMPRESSION: 1. Previously noted mass of the central pancreatic head is almost entirely resolved, difficult to discretely appreciate on current examination. Findings are consistent with treatment response. There remains obstruction of the pancreatic duct near the head neck junction with mild prominence of the pancreatic duct, measuring up to 5 mm, with atrophy of the distal pancreatic parenchyma. 2. The portal vein, splenic vein, and superior mesenteric vein are now widely patent, previously effaced at the confluence by mass effect. 3. Interval placement of common bile duct stent, tip positioned in the distal duodenum, with relief of previously seen biliary ductal dilatation. 4. 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 Following the bifurcation of the celiac mesenteric trunk,  the celiac axis appears to directly traverse the vicinity of the mass, although there does appear to be a fat plane about the vessel. 5. The distal small bowel and colon are diffusely somewhat hyperenhancing and inflamed appearing with vascular combing and a tethered appearance of the distal small bowel. This appearance generally suggests inflammatory bowel disease such as Crohn's disease. Correlate with referable clinical history, if present. No evidence of obstruction or other acute complication. 6. Hepatic steatosis. 7. Aortic atherosclerosis.   12/28/2020 Genetic Testing   Negative hereditary cancer genetic testing: no pathogenic variants detected in [employer] Common Hereditary Cancers Panel.  The report date is December 28, 2020.    The Common Hereditary Cancers Panel offered by [employer] includes sequencing and/or deletion duplication testing of the following 47 genes: APC, ATM, AXIN2, BARD1, BMPR1A, BRCA1, BRCA2, BRIP1, CDH1, CDK4, CDKN2A (p14ARF), CDKN2A (p16INK4a), CHEK2, CTNNA1, DICER1, EPCAM (Deletion/duplication testing only), GREM1 (promoter region deletion/duplication testing only), GREM1, HOXB13, KIT, MEN1, MLH1, MSH2, MSH3, MSH6, MUTYH, NBN, NF1, NHTL1, PALB2, PDGFRA, PMS2, POLD1, POLE, PTEN, RAD50, RAD51C, RAD51D, SDHA, SDHB, SDHC, SDHD, SMAD4, SMARCA4. STK11, TP53, TSC1, TSC2, and VHL.  The following genes were evaluated for sequence changes only: SDHA and HOXB13 c.251G>A variant only.   01/17/2021 Imaging   CT CAP from [facility] IMPRESSION Small lesion in the pancreatic head measuring approximately 1.1 x 0.8 cm without evidence of vascular involvement or metastasis consistent with previously described, biopsy-proven pancreatic adenocarcinoma.   02/08/2021 Surgery   Whipple Surgery with Dr Deveron Furlong  Final Pathologic Diagnosis      A.  GALLBLADDER, CHOLECYSTECTOMY: Chronic cholecystitis. No malignancy identified.   B.  BILE DUCT STENT, REMOVAL (GROSS ONLY DIAGNOSIS): Stent, see  gross description.   C.  WHIPPLE RESECTION: No residual malignancy identified. Chronic and acute inflammation with granulation tissue reaction, fibrosis and features of chronic pancreatitis, suggestive of therapy effect. Fourteen lymph nodes, negative for metastasis (0/14). Margins negative for malignancy.        06/07/2021 Imaging   CT AP  IMPRESSION: 1. No current findings of recurrent malignancy. Interval Whipple procedure with expected postoperative findings. 2. A 3.5 cm stent is present in the dorsal pancreatic duct. No duct dilatation. There is an approximately 1.5 mm lucency centrally along this stent shown on image 43 series 7, possibilities include stent fracture, two separate stents, or an intentional radial lucency in this type of stent. 3. Small type 1 hiatal hernia. Mild distal esophageal wall thickening may reflect low-grade esophagitis. 4.  Aortic Atherosclerosis (ICD10-I70.0). 5.  Prominent stool throughout the colon favors constipation. 6. Uterine fibroid. 7. 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   Port-A-Cath in place     Discussed the use of AI scribe software for clinical note transcription with the patient, who gave verbal consent to proceed.  History of Present Illness   A 62 year old patient with a history of pancreatic cancer presents for a follow-up appointment. The patient had a recent CT scan due to a slightly elevated tumor marker. She reports feeling well with no abdominal pain. She has started taking Creon since last Thursday, which has significantly improved her diarrhea. The patient reports that her bowel movements have returned to normal since starting Creon. She takes one pill before each major meal and has noticed no adverse effects. The patient also mentions that she has a hernia near her belly button, but it is not related to her current concerns.         All other systems were reviewed with  the patient and are negative.  MEDICAL HISTORY:  Past Medical History:  Diagnosis Date   Anxiety    Cancer (HCC) 07/2020   pancreatic cancer   Depression    Diabetes mellitus without complication (HCC) 07/2020   pancreatic cancer   High cholesterol    History of blood transfusion    History of hiatal hernia    small   Hypertension    Stroke [facility])    age 54, no residual effect    SURGICAL HISTORY: Past Surgical History:  Procedure Laterality Date   BILIARY BRUSHING  08/22/2020   Procedure: BILIARY BRUSHING;  Surgeon: Vida Rigger, MD;  Location: [facility];  Service: Endoscopy;;   BILIARY STENT PLACEMENT N/A 08/22/2020   Procedure: BILIARY STENT PLACEMENT;  Surgeon: Vida Rigger, MD;  Location: [facility];  Service: Endoscopy;  Laterality: N/A;   ENDOSCOPIC RETROGRADE CHOLANGIOPANCREATOGRAPHY (ERCP) WITH PROPOFOL N/A 08/22/2020   Procedure: ENDOSCOPIC RETROGRADE CHOLANGIOPANCREATOGRAPHY (ERCP) WITH PROPOFOL;  Surgeon: Vida Rigger, MD;  Location: [facility];  Service: Endoscopy;  Laterality: N/A;   ESOPHAGOGASTRODUODENOSCOPY (EGD) WITH PROPOFOL N/A 08/24/2020   Procedure: ESOPHAGOGASTRODUODENOSCOPY (EGD) WITH PROPOFOL;  Surgeon: Willis Modena, MD;  Location: [facility];  Service: Endoscopy;  Laterality: N/A;   FINE NEEDLE ASPIRATION N/A 08/24/2020   Procedure: FINE NEEDLE ASPIRATION (FNA) LINEAR;  Surgeon: Willis Modena, MD;  Location: [facility];  Service: Endoscopy;  Laterality: N/A;   PORT-A-CATH REMOVAL N/A 12/08/2021   Procedure: REMOVAL PORT-A-CATH;  Surgeon: Fritzi Mandes, MD;  Location: [facility] OR;  Service: General;  Laterality: N/A;   PORTACATH PLACEMENT Right 09/08/2020   Procedure: INSERTION PORT-A-CATH;  Surgeon: Fritzi Mandes, MD;  Location: [facility];  Service: General;  Laterality: Right;   SPHINCTEROTOMY  08/22/2020   Procedure: SPHINCTEROTOMY;  Surgeon: Vida Rigger, MD;  Location: [facility];  Service: Endoscopy;;   UPPER ESOPHAGEAL  ENDOSCOPIC ULTRASOUND (EUS) N/A 08/24/2020   Procedure: UPPER ESOPHAGEAL ENDOSCOPIC ULTRASOUND (EUS);  Surgeon: Willis Modena, MD;  Location: [facility];  Service: Endoscopy;  Laterality: N/A;    I have reviewed the social history and family history with the patient and they are unchanged from previous note.  ALLERGIES:  is allergic to tyloxapol, oxycodone-acetaminophen, and poison ivy extract.  MEDICATIONS:  Current Outpatient Medications  Medication Sig Dispense Refill   acetaminophen (TYLENOL) 325 MG tablet Take 650 mg by mouth every 6 (six) hours as needed (pain).     citalopram (CELEXA) 40 MG tablet Take 40 mg by mouth every evening.     hydrochlorothiazide (MICROZIDE) 12.5 MG capsule Take 12.5 mg by mouth in the morning.     lipase/protease/amylase (CREON) 36000 UNITS CPEP capsule TAKE 2 CAPSULES BY MOUTH 3 TIMES A DAY WITH A MEAL. MAY ALSO TAKE 1 CAPSULE AS NEEDED WITH SNACKS 240 capsule 5   magnesium oxide (MAG-OX) 400 (241.3 Mg) MG tablet Take 1 tablet (400 mg total) by mouth 2 (two) times daily. (Patient taking differently: Take 400 mg by mouth every evening.) 60 tablet 0   potassium chloride (KLOR-CON) 10 MEQ tablet TAKE 1 TABLET BY MOUTH EVERY DAY 90 tablet 1   simvastatin (ZOCOR) 80 MG tablet Take 80 mg by mouth every evening.     No current facility-administered medications for this visit.    PHYSICAL EXAMINATION: ECOG PERFORMANCE STATUS: 0 - Asymptomatic  Vitals:   09/18/23 0812  BP: 121/70  Pulse: (!) 54  Resp: 18  Temp: 98 F (36.7 C)  SpO2: 99%   Wt Readings from Last 3 Encounters:  09/18/23 131 lb 14.4 oz (59.8 kg)  09/12/23 131 lb 2 oz (59.5 kg)  05/16/23 128 lb 11.2 oz (58.4 kg)     GENERAL:alert, no distress and comfortable SKIN: skin color, texture, turgor are normal, no rashes or significant lesions EYES: normal, Conjunctiva are pink and non-injected, sclera clear NECK: supple, thyroid normal size, non-tender, without nodularity LYMPH:  no  palpable lymphadenopathy in the cervical, axillary  LUNGS: clear to auscultation and percussion with normal breathing effort HEART: regular rate & rhythm and no murmurs and no lower extremity edema ABDOMEN:abdomen soft, non-tender and normal bowel sounds Musculoskeletal:no cyanosis of digits and no clubbing  NEURO: alert & oriented x 3 with fluent speech, no focal motor/sensory deficits   LABORATORY DATA:  I have reviewed the data as listed    Latest Ref Rng & Units 09/18/2023    7:49 AM 09/12/2023    8:15 AM 05/16/2023    8:20 AM  CBC  WBC 4.0 - 10.5 K/uL 6.4  6.5  6.8   Hemoglobin 12.0 - 15.0 g/dL 16.1  09.6  04.5   Hematocrit 36.0 - 46.0 % 39.0  39.0  40.5   Platelets 150 - 400 K/uL 262  251  291         Latest Ref Rng & Units 09/18/2023    7:49 AM 09/12/2023    8:15 AM 05/16/2023    8:20 AM  CMP  Glucose 70 - 99 mg/dL 97  94  88   BUN 8 - 23 mg/dL  14  7  10    Creatinine 0.44 - 1.00 mg/dL 1.61  0.96  0.45   Sodium 135 - 145 mmol/L 143  143  143   Potassium 3.5 - 5.1 mmol/L 3.8  4.0  3.6   Chloride 98 - 111 mmol/L 107  109  105   CO2 22 - 32 mmol/L 28  28  31    Calcium 8.9 - 10.3 mg/dL 8.9  9.1  9.2   Total Protein 6.5 - 8.1 g/dL 6.5  6.6  6.9   Total Bilirubin 0.3 - 1.2 mg/dL 0.8  1.3  1.5   Alkaline Phos 38 - 126 U/L 89  107  86   AST 15 - 41 U/L 41  62  32   ALT 0 - 44 U/L 67  108  37       RADIOGRAPHIC STUDIES: I have personally reviewed the radiological images as listed and agreed with the findings in the report. CT CHEST ABDOMEN PELVIS W CONTRAST  Result Date: 09/18/2023 CLINICAL DATA:  Staging pancreatic cancer. * Tracking Code: BO * EXAM: CT CHEST, ABDOMEN, AND PELVIS WITH CONTRAST TECHNIQUE: Multidetector CT imaging of the chest, abdomen and pelvis was performed following the standard protocol during bolus administration of intravenous contrast. RADIATION DOSE REDUCTION: This exam was performed according to the departmental dose-optimization program which  includes automated exposure control, adjustment of the mA and/or kV according to patient size and/or use of iterative reconstruction technique. CONTRAST:  OMNIPAQUE IOHEXOL 300 MG/ML  SOLN COMPARISON:  CT 01/15/2023 and older. FINDINGS: CT CHEST FINDINGS Cardiovascular: Heart is nonenlarged. No pericardial effusion. Slight atherosclerotic changes along the thoracic aorta. There is a variant of separate origin of the left vertebral artery directly from the aortic arch, just proximal to the left subclavian artery origin. Mediastinum/Nodes: Small hiatal hernia. The thoracic esophagus otherwise has a normal course and caliber. Small thyroid gland. No specific abnormal lymph node enlargement identified in the axillary regions, hilum or mediastinum. Lungs/Pleura: No consolidation, pneumothorax or effusion. There is calcified nodule left lower lobe series 6, image 47 consistent with old granulomatous disease. This has unchanged from previous. There is a left upper lobe nodule identified on series 6, image 44 which is developing measuring today 12 by 6 mm. With changes are worrisome for potential neoplastic lesion. Musculoskeletal: Osteopenia. Scattered degenerative changes of the spine. There is mild compression of the superior endplate of L1, unchanged from previous. CT ABDOMEN PELVIS FINDINGS Hepatobiliary: Fatty liver infiltration. No space-occupying liver lesion. Previous cholecystectomy. Patent portal vein. Pancreas: Patient is status post Whipple procedure. The pancreas is severely atrophic. There is again a stent in the pancreatic duct. There is some developing nodular tissue along the margin of the residual pancreas which is new. This has seen on coronal series 4 image 49, axial series 2, image 56 and in the axial plane measures 2.1 by 1.6 cm worrisome for developing neoplasm. Spleen: Normal in size without focal abnormality. Adrenals/Urinary Tract: The adrenal glands are preserved. There is mild atrophy of  the right kidney. No enhancing renal mass or collecting system dilatation. Ureters have normal course and caliber extending down to the bladder. Preserved contours of the urinary bladder. Stomach/Bowel: Again surgical changes of Whipple procedure. There is some debris in the stomach but is nondilated. The small bowel is nondilated large bowel has diffuse colonic stool. Appendix not well seen in the right lower quadrant but no pericecal stranding or fluid. Vascular/Lymphatic: Aortic atherosclerosis. No enlarged abdominal or pelvic lymph  nodes. Reproductive: Uterus and bilateral adnexa are unremarkable. Other: No free air or free fluid. Small midline anterior abdominal wall hernia identified with fat and small portion of small bowel as seen on series 2 image 82. No obstruction. There are some separate rectus muscle diastasis. Second more midline hernia more superiorly as well just above the umbilicus. Musculoskeletal: Osteopenia.  Degenerative changes. IMPRESSION: Developing 2.1 x 1.6 cm mass at the margin of the residual pancreatitis worrisome for a area of recurrence or new area of neoplasm. Other soft tissue or node. This has a of the stump of the Whipple procedure. Developing left upper lobe spiculated 12 mm lung nodule also worrisome for developing area of neoplastic disease. A dedicated PET-CT scan may be of benefit to further evaluate this lesion as clinically appropriate. Fatty liver infiltration.  Small hiatal hernia. Significant colonic stool. Findings will be called to the ordering service by the Radiology physician assistant team. Electronically Signed   By: Karen Kays M.D.   On: 09/18/2023 10:46      Orders Placed This Encounter  Procedures   NM PET Image Initial (PI) Skull Base To Thigh    Standing Status:   Future    Standing Expiration Date:   09/17/2024    Order Specific Question:   If indicated for the ordered procedure, I authorize the administration of a radiopharmaceutical per Radiology  protocol    Answer:   Yes    Order Specific Question:   Preferred imaging location?    Answer:   [facility]   All questions were answered. The patient knows to call the clinic with any problems, questions or concerns. No barriers to learning was detected. The total time spent in the appointment was 30 minutes.     Malachy Mood, MD 09/18/2023

## 2023-09-18 NOTE — Progress Notes (Signed)
Enrolled pt online in the [employer] Complete program w/[employer].  Spoke with pt's spouse "Joe" to inform him that someone from the [employer] program will be contacting her via telephone or by email regarding next steps in completing the application for [employer].  Joe said he will make the pt aware that they will be contacting the pt to complete the application process.  Joe has no further questions or concerns.

## 2023-09-20 ENCOUNTER — Other Ambulatory Visit: Payer: Self-pay | Admitting: Hematology

## 2023-09-23 ENCOUNTER — Telehealth: Payer: Self-pay | Admitting: Hematology

## 2023-09-24 ENCOUNTER — Other Ambulatory Visit: Payer: Self-pay | Admitting: Nurse Practitioner

## 2023-09-25 ENCOUNTER — Other Ambulatory Visit: Payer: Self-pay | Admitting: *Deleted

## 2023-09-25 ENCOUNTER — Other Ambulatory Visit: Payer: Self-pay

## 2023-09-25 DIAGNOSIS — C250 Malignant neoplasm of head of pancreas: Secondary | ICD-10-CM

## 2023-09-25 NOTE — Progress Notes (Signed)
The proposed treatment discussed in conference is for discussion purpose only and is not a binding recommendation.  The patients have not been physically examined, or presented with their treatment options.  Therefore, final treatment plans cannot be decided.  

## 2023-09-27 ENCOUNTER — Other Ambulatory Visit: Payer: Self-pay

## 2023-09-27 ENCOUNTER — Telehealth: Payer: Self-pay

## 2023-09-27 NOTE — Telephone Encounter (Signed)
Received Secure Chat message from Jacqueline Tyler in [place] [facility] stating pt called to schedule a bx Dr. Mosetta Putt had ordered for pt.  Pt was told their is no order in EP[employer] for a bx.  Jacqueline requested if Dr. Latanya Maudlin office would give pt a call.  Called pt and spoke with pt regarding bx.  Informed pt that the bx is being done outside of the [facility] and this nurse would reach out to their office regarding the status of getting the bx scheduled.  Pt verbalized understanding and stated she would await their call.   Sent staff message to Clelia Schaumann and Dr. Mosetta Putt regarding status of pt's bx because this was discussed w/the GI tumor board.  Dr. Dulce Sellar didn't feel this pt was a good candidate for EUS Bx.  Awaiting their response.

## 2023-09-30 ENCOUNTER — Encounter (HOSPITAL_COMMUNITY)
Admission: RE | Admit: 2023-09-30 | Discharge: 2023-09-30 | Disposition: A | Discharge status: Home / Self Care | Payer: No Typology Code available for payment source | Source: Ambulatory Visit | Attending: Hematology | Admitting: Hematology

## 2023-09-30 DIAGNOSIS — C250 Malignant neoplasm of head of pancreas: Secondary | ICD-10-CM | POA: Insufficient documentation

## 2023-09-30 LAB — GLUCOSE, CAPILLARY: Glucose-Capillary: 87 mg/dL (ref 70–99)

## 2023-09-30 MED ORDER — FLUDEOXYGLUCOSE F - 18 (FDG) INJECTION
6.5500 | Freq: Once | INTRAVENOUS | Status: AC
  Administered 2023-09-30: 6.55 via INTRAVENOUS

## 2023-10-07 ENCOUNTER — Telehealth: Payer: Self-pay | Admitting: Emergency Medicine

## 2023-10-07 ENCOUNTER — Encounter: Payer: Self-pay | Admitting: Hematology

## 2023-10-07 ENCOUNTER — Other Ambulatory Visit: Payer: Self-pay

## 2023-10-07 ENCOUNTER — Inpatient Hospital Stay: Payer: No Typology Code available for payment source

## 2023-10-07 ENCOUNTER — Inpatient Hospital Stay: Payer: No Typology Code available for payment source | Attending: Hematology | Admitting: Hematology

## 2023-10-07 VITALS — BP 120/72 | HR 63 | Temp 98.1°F | Resp 18 | Wt 131.9 lb

## 2023-10-07 DIAGNOSIS — Z9221 Personal history of antineoplastic chemotherapy: Secondary | ICD-10-CM | POA: Insufficient documentation

## 2023-10-07 DIAGNOSIS — C250 Malignant neoplasm of head of pancreas: Secondary | ICD-10-CM | POA: Diagnosis present

## 2023-10-07 DIAGNOSIS — Z79899 Other long term (current) drug therapy: Secondary | ICD-10-CM | POA: Diagnosis not present

## 2023-10-07 LAB — CBC WITH DIFFERENTIAL (CANCER CENTER ONLY)
Abs Immature Granulocytes: 0.02 10*3/uL (ref 0.00–0.07)
Basophils Absolute: 0.1 10*3/uL (ref 0.0–0.1)
Basophils Relative: 1 %
Eosinophils Absolute: 0.1 10*3/uL (ref 0.0–0.5)
Eosinophils Relative: 2 %
HCT: 39.4 % (ref 36.0–46.0)
Hemoglobin: 13 g/dL (ref 12.0–15.0)
Immature Granulocytes: 0 %
Lymphocytes Relative: 25 %
Lymphs Abs: 1.7 10*3/uL (ref 0.7–4.0)
MCH: 29 pg (ref 26.0–34.0)
MCHC: 33 g/dL (ref 30.0–36.0)
MCV: 87.8 fL (ref 80.0–100.0)
Monocytes Absolute: 0.7 10*3/uL (ref 0.1–1.0)
Monocytes Relative: 10 %
Neutro Abs: 4.3 10*3/uL (ref 1.7–7.7)
Neutrophils Relative %: 62 %
Platelet Count: 250 10*3/uL (ref 150–400)
RBC: 4.49 MIL/uL (ref 3.87–5.11)
RDW: 13.2 % (ref 11.5–15.5)
WBC Count: 6.8 10*3/uL (ref 4.0–10.5)
nRBC: 0 % (ref 0.0–0.2)

## 2023-10-07 LAB — CMP (CANCER CENTER ONLY)
ALT: 34 U/L (ref 0–44)
AST: 35 U/L (ref 15–41)
Albumin: 4 g/dL (ref 3.5–5.0)
Alkaline Phosphatase: 64 U/L (ref 38–126)
Anion gap: 8 (ref 5–15)
BUN: 16 mg/dL (ref 8–23)
CO2: 27 mmol/L (ref 22–32)
Calcium: 8.8 mg/dL — ABNORMAL LOW (ref 8.9–10.3)
Chloride: 106 mmol/L (ref 98–111)
Creatinine: 0.98 mg/dL (ref 0.44–1.00)
GFR, Estimated: >60 mL/min (ref >60)
Glucose, Bld: 102 mg/dL — ABNORMAL HIGH (ref 70–99)
Potassium: 3.9 mmol/L (ref 3.5–5.1)
Sodium: 141 mmol/L (ref 135–145)
Total Bilirubin: 1 mg/dL (ref <1.2)
Total Protein: 6.7 g/dL (ref 6.5–8.1)

## 2023-10-07 NOTE — Telephone Encounter (Signed)
Double book my last OV on Friday 12/6.

## 2023-10-07 NOTE — Telephone Encounter (Signed)
First available opening for lung nodule consult is December 05, 2023. Would you like to [facility] appointment.

## 2023-10-07 NOTE — Progress Notes (Signed)
[facility]   [phone] [phone]   Clinic Follow up Note   Patient Care Team: Leola Brazil, DO as PCP - General (Internal Medicine) Malachy Mood, MD as Consulting Physician (Oncology) Fritzi Mandes, MD as Consulting Physician (General Surgery)  Date of Service:  10/07/2023  CHIEF COMPLAINT: f/u of pancreatic cancer  CURRENT THERAPY:  Surveillance  Oncology History   Pancreatic cancer (HCC) stage IIB, [name], [name] -Diagnosed 08/24/20 on EUS with Dr Dulce Sellar, which showed mass at head of pancreas, s/p stenting, with SMV and PV invasion, biopsy confirmed adenocarcinoma with few malignant-appearing LNs in peripancreatic and porta hepatis region. Staging was negative for metastatic disease.  -she completed neoadjuvant chemo FOLFIRINOX q2 weeks 09/12/20 - 12/29/20. -s/p whipple surgery by Dr Flonnie Hailstone on 02/08/21, path showed no residual disease.  -due to the high recurrence risk of pancreatic cancer, she is under close surveillance.  -Her PET scan from September 30, 2023 showed hypermetabolic soft tissue mass at the previous Whipple surgical site, and a small mildly hypermetabolic nodule in the left lung, concerning for metastatic recurrence.    Assessment and Plan    Pancreatic Cancer with Possible Recurrence Pancreatic cancer status post-Whipple surgery. Recent PET scan shows 2.1 x 1.6 cm mass with hypermetabolic uptake at the previous surgical site, and a mildly hypermetabolic small left lung nodule, suggesting possible recurrence. Improved bowel movements with Creon and well-managed weight. Discussed risks of recurrence, potential chemotherapy (3-6 months), and radiation therapy if recurrence are confirmed. Low chance of cure but treatable for disease control. - Send message to pulmonary clinic for biopsy scheduling - Confirm biopsy date and schedule follow-up appointment 2-3 days post-biopsy - Discuss potential chemotherapy regimen if recurrence  is confirmed - Consider radiation therapy post-chemotherapy if two spots are confirmed - Consult Dr. Imogene Burn for surgical options if lung biopsy is negative  Possible Metastatic Lung Lesion PET scan shows a lung lesion with uptake, suggesting possible metastasis from pancreatic cancer. Lesion not highly active; biopsy needed for confirmation. Discussed potential benign nature or other lung cancer. If benign, consult Dr. Flonnie Hailstone for surgical options. - Referred to pulmonary clinic for lung biopsy - Send message to pulmonologists to expedite biopsy scheduling - Discuss treatment options based on biopsy results, including chemotherapy if metastatic pancreatic cancer is confirmed  General Health Maintenance Changing insurance plans, needs to ensure continuity of care with current oncologist. Advised to contact [facility] to confirm coverage and discuss potential appeals for continuity of care. - Call [facility] to confirm coverage and discuss appeal options for continuity of care - Inform oncology office of insurance changes and potential need for appeal  Follow-up - Call pulmonary clinic if no response within 1-2 days to schedule consults and bronchoscopy biopsy - Call oncology office with biopsy date to schedule follow-up appointment.  I plan to see her 2 to 3 days after her biopsy       SUMMARY OF ONCOLOGIC HISTORY: Oncology History Overview Note  Cancer Staging Pancreatic cancer [place]) Staging form: Exocrine Pancreas, AJCC 8th Edition - Clinical stage from 08/24/2020: Stage IIB (cT2, cN1, cM0) - Signed by Malachy Mood, MD on 08/30/2020 Stage prefix: Initial diagnosis    Pancreatic cancer (HCC)  08/20/2020 Imaging   CT AP 08/20/20  IMPRESSION: 1. 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 This mass appears to be leading to occlusion of the superior mesenteric vein as well as the  proximal portion of the main portal vein.  Collateral circulation is noted. There is moderate intrahepatic and extrahepatic biliary dilatation which appears to be due to the pancreatic head mass. Pancreatic ductal dilatation is noted as well. 2. Probable 2.7 cm uterine fibroid. 3. Aortic atherosclerosis.   Aortic Atherosclerosis (ICD10-I70.0).   08/20/2020 Tumor Marker   Ca19-9 - 927   08/22/2020 Procedure   ERCP by Dr Ewing Schlein 08/22/20  IMPRESSION - The major papilla appeared normal. - A biliary sphincterotomy was performed. - Cells for cytology obtained in the lower third and middle of the main duct. - One plastic stent was placed into the common bile duct.   FINAL MICROSCOPIC DIAGNOSIS:  - No malignant cells identified  - Benign reactive/reparative changes   08/24/2020 Cancer Staging   Staging form: Exocrine Pancreas, AJCC 8th Edition - Clinical stage from 08/24/2020: Stage IIB (cT2, cN1, cM0) - Signed by Malachy Mood, MD on 08/30/2020   08/24/2020 Procedure   EUS by Dr Dulce Sellar 08/24/20  IMPRESSION - There was no sign of significant pathology in the ampulla. - A few malignant-appearing lymph nodes were visualized in the peripancreatic region and porta hepatis region. - One stent was visualized endosonographically in the common bile duct. - A mass was identified in the pancreatic head. Tissue was obtained from this exam. The preliminary diagnosis is consistent with adenocarcinoma. Invasion into SMV/PV seen. Lymphadenopathy noted. This was staged T3 N1 Mx by endosonographic criteria. Fine needle aspiration performed.   08/24/2020 Initial Biopsy   FINAL MICROSCOPIC DIAGNOSIS: 08/24/20 - Malignant cells consistent with adenocarcinoma    08/30/2020 Initial Diagnosis   Pancreatic cancer (HCC)   09/05/2020 Imaging   CT Chest  IMPRESSION: 1. Stable 2.7 cm infiltrating pancreatic head mass with borderline enlarged peripancreatic lymph nodes. 2. No findings for pulmonary metastatic disease. 3. Small hiatal hernia. 4. Aortic  atherosclerosis.   Aortic Atherosclerosis (ICD10-I70.0).   09/08/2020 Procedure   INSERTION PORT-A-CATH by Dr Freida Busman and Donell Beers    09/12/2020 - 12/29/2020 Chemotherapy   Neoadjuvant FOLFIRINOX q2 weeks starting 09/12/20-12/29/20    12/02/2020 Imaging   CT CAP  IMPRESSION: 1. Previously noted mass of the central pancreatic head is almost entirely resolved, difficult to discretely appreciate on current examination. Findings are consistent with treatment response. There remains obstruction of the pancreatic duct near the head neck junction with mild prominence of the pancreatic duct, measuring up to 5 mm, with atrophy of the distal pancreatic parenchyma. 2. The portal vein, splenic vein, and superior mesenteric vein are now widely patent, previously effaced at the confluence by mass effect. 3. Interval placement of common bile duct stent, tip positioned in the distal duodenum, with relief of previously seen biliary ductal dilatation. 4. 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 Following the bifurcation of the celiac mesenteric trunk, the celiac axis appears to directly traverse the vicinity of the mass, although there does appear to be a fat plane about the vessel. 5. The distal small bowel and colon are diffusely somewhat hyperenhancing and inflamed appearing with vascular combing and a tethered appearance of the distal small bowel. This appearance generally suggests inflammatory bowel disease such as Crohn's disease. Correlate with referable clinical history, if present. No evidence of obstruction or other acute complication. 6. Hepatic steatosis. 7. Aortic atherosclerosis.   12/28/2020 Genetic Testing   Negative hereditary cancer genetic testing: no pathogenic  variants detected in [employer] Common Hereditary Cancers  Panel.  The report date is December 28, 2020.    The Common Hereditary Cancers Panel offered by [facility] includes sequencing and/or deletion duplication testing of the following 47 genes: APC, ATM, AXIN2, BARD1, BMPR1A, BRCA1, BRCA2, BRIP1, CDH1, CDK4, CDKN2A (p14ARF), CDKN2A (p16INK4a), CHEK2, CTNNA1, DICER1, EPCAM (Deletion/duplication testing only), GREM1 (promoter region deletion/duplication testing only), GREM1, HOXB13, KIT, MEN1, MLH1, MSH2, MSH3, MSH6, MUTYH, NBN, NF1, NHTL1, PALB2, PDGFRA, PMS2, POLD1, POLE, PTEN, RAD50, RAD51C, RAD51D, SDHA, SDHB, SDHC, SDHD, SMAD4, SMARCA4. STK11, TP53, TSC1, TSC2, and VHL.  The following genes were evaluated for sequence changes only: SDHA and HOXB13 c.251G>A variant only.   01/17/2021 Imaging   CT CAP from [facility] IMPRESSION Small lesion in the pancreatic head measuring approximately 1.1 x 0.8 cm without evidence of vascular involvement or metastasis consistent with previously described, biopsy-proven pancreatic adenocarcinoma.   02/08/2021 Surgery   Whipple Surgery with Dr Deveron Furlong  Final Pathologic Diagnosis      A.  GALLBLADDER, CHOLECYSTECTOMY: Chronic cholecystitis. No malignancy identified.   B.  BILE DUCT STENT, REMOVAL (GROSS ONLY DIAGNOSIS): Stent, see gross description.   C.  WHIPPLE RESECTION: No residual malignancy identified. Chronic and acute inflammation with granulation tissue reaction, fibrosis and features of chronic pancreatitis, suggestive of therapy effect. Fourteen lymph nodes, negative for metastasis (0/14). Margins negative for malignancy.        06/07/2021 Imaging   CT AP  IMPRESSION: 1. No current findings of recurrent malignancy. Interval Whipple procedure with expected postoperative findings. 2. A 3.5 cm stent is present in the dorsal pancreatic duct. No duct dilatation. There is an approximately 1.5 mm lucency centrally along this stent shown on image 43 series 7, possibilities include stent fracture, two separate  stents, or an intentional radial lucency in this type of stent. 3. Small type 1 hiatal hernia. Mild distal esophageal wall thickening may reflect low-grade esophagitis. 4.  Aortic Atherosclerosis (ICD10-I70.0). 5.  Prominent stool throughout the colon favors constipation. 6. Uterine fibroid. 7. 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   Port-A-Cath in place     Discussed the use of AI scribe software for clinical note transcription with the patient, who gave verbal consent to proceed.  History of Present Illness   Jacqueline Tyler, a 62 year old female with a history of pancreatic cancer, presents for a follow-up visit. She reports that her bowel movements and stomach cramps have improved since starting Creon. She now has one bowel movement per day, down from four or five previously. She has not noticed any significant weight changes. She recently underwent a PET scan, the results of which are not yet officially reported.         All other systems were reviewed with the patient and are negative.  MEDICAL HISTORY:  Past Medical History:  Diagnosis Date   Anxiety    Cancer (HCC) 07/2020   pancreatic cancer   Depression    Diabetes mellitus without complication (HCC) 07/2020   pancreatic cancer   High cholesterol    History of blood transfusion    History of hiatal hernia    small   Hypertension    Stroke [facility])    age 67, no residual effect    SURGICAL HISTORY: Past Surgical History:  Procedure Laterality Date   BILIARY BRUSHING  08/22/2020   Procedure: BILIARY BRUSHING;  Surgeon: Vida Rigger, MD;  Location: [facility];  Service: Endoscopy;;   BILIARY STENT PLACEMENT N/A 08/22/2020  Procedure: BILIARY STENT PLACEMENT;  Surgeon: Vida Rigger, MD;  Location: [facility];  Service: Endoscopy;  Laterality: N/A;   ENDOSCOPIC RETROGRADE CHOLANGIOPANCREATOGRAPHY (ERCP) WITH PROPOFOL N/A 08/22/2020   Procedure: ENDOSCOPIC RETROGRADE  CHOLANGIOPANCREATOGRAPHY (ERCP) WITH PROPOFOL;  Surgeon: Vida Rigger, MD;  Location: [facility];  Service: Endoscopy;  Laterality: N/A;   ESOPHAGOGASTRODUODENOSCOPY (EGD) WITH PROPOFOL N/A 08/24/2020   Procedure: ESOPHAGOGASTRODUODENOSCOPY (EGD) WITH PROPOFOL;  Surgeon: Willis Modena, MD;  Location: [facility];  Service: Endoscopy;  Laterality: N/A;   FINE NEEDLE ASPIRATION N/A 08/24/2020   Procedure: FINE NEEDLE ASPIRATION (FNA) LINEAR;  Surgeon: Willis Modena, MD;  Location: [facility];  Service: Endoscopy;  Laterality: N/A;   PORT-A-CATH REMOVAL N/A 12/08/2021   Procedure: REMOVAL PORT-A-CATH;  Surgeon: Fritzi Mandes, MD;  Location: [facility] OR;  Service: General;  Laterality: N/A;   PORTACATH PLACEMENT Right 09/08/2020   Procedure: INSERTION PORT-A-CATH;  Surgeon: Fritzi Mandes, MD;  Location: [facility];  Service: General;  Laterality: Right;   SPHINCTEROTOMY  08/22/2020   Procedure: SPHINCTEROTOMY;  Surgeon: Vida Rigger, MD;  Location: [facility];  Service: Endoscopy;;   UPPER ESOPHAGEAL ENDOSCOPIC ULTRASOUND (EUS) N/A 08/24/2020   Procedure: UPPER ESOPHAGEAL ENDOSCOPIC ULTRASOUND (EUS);  Surgeon: Willis Modena, MD;  Location: [facility];  Service: Endoscopy;  Laterality: N/A;    I have reviewed the social history and family history with the patient and they are unchanged from previous note.  ALLERGIES:  is allergic to tyloxapol, oxycodone-acetaminophen, and poison ivy extract.  MEDICATIONS:  Current Outpatient Medications  Medication Sig Dispense Refill   acetaminophen (TYLENOL) 325 MG tablet Take 650 mg by mouth every 6 (six) hours as needed (pain).     citalopram (CELEXA) 40 MG tablet Take 40 mg by mouth every evening.     CREON 36000-114000 units CPEP capsule TAKE 2 CAPSULES BY MOUTH 3 TIMES A DAY WITH A MEAL. MAY ALSO TAKE 1 CAPSULE AS NEEDED WITH SNACKS 240 capsule 2   hydrochlorothiazide (MICROZIDE) 12.5 MG capsule Take 12.5 mg by mouth in the morning.      magnesium oxide (MAG-OX) 400 (241.3 Mg) MG tablet Take 1 tablet (400 mg total) by mouth 2 (two) times daily. (Patient taking differently: Take 400 mg by mouth every evening.) 60 tablet 0   potassium chloride (KLOR-CON) 10 MEQ tablet TAKE 1 TABLET BY MOUTH EVERY DAY 90 tablet 1   simvastatin (ZOCOR) 80 MG tablet Take 80 mg by mouth every evening.     No current facility-administered medications for this visit.    PHYSICAL EXAMINATION: ECOG PERFORMANCE STATUS: 0 - Asymptomatic  Vitals:   10/07/23 0819  BP: 120/72  Pulse: 63  Resp: 18  Temp: 98.1 F (36.7 C)  SpO2: 99%   Wt Readings from Last 3 Encounters:  10/07/23 131 lb 14.4 oz (59.8 kg)  09/18/23 131 lb 14.4 oz (59.8 kg)  09/12/23 131 lb 2 oz (59.5 kg)     GENERAL:alert, no distress and comfortable SKIN: skin color, texture, turgor are normal, no rashes or significant lesions EYES: normal, Conjunctiva are pink and non-injected, sclera clear Musculoskeletal:no cyanosis of digits and no clubbing  NEURO: alert & oriented x 3 with fluent speech, no focal motor/sensory deficits  LABORATORY DATA:  I have reviewed the data as listed    Latest Ref Rng & Units 10/07/2023    7:41 AM 09/18/2023    7:49 AM 09/12/2023    8:15 AM  CBC  WBC 4.0 - 10.5 K/uL 6.8  6.4  6.5  Hemoglobin 12.0 - 15.0 g/dL 40.9  81.1  91.4   Hematocrit 36.0 - 46.0 % 39.4  39.0  39.0   Platelets 150 - 400 K/uL 250  262  251         Latest Ref Rng & Units 10/07/2023    7:41 AM 09/18/2023    7:49 AM 09/12/2023    8:15 AM  CMP  Glucose 70 - 99 mg/dL 782  97  94   BUN 8 - 23 mg/dL 16  14  7    Creatinine 0.44 - 1.00 mg/dL 9.56  2.13  0.86   Sodium 135 - 145 mmol/L 141  143  143   Potassium 3.5 - 5.1 mmol/L 3.9  3.8  4.0   Chloride 98 - 111 mmol/L 106  107  109   CO2 22 - 32 mmol/L 27  28  28    Calcium 8.9 - 10.3 mg/dL 8.8  8.9  9.1   Total Protein 6.5 - 8.1 g/dL 6.7  6.5  6.6   Total Bilirubin <1.2 mg/dL 1.0  0.8  1.3   Alkaline Phos 38 - 126  U/L 64  89  107   AST 15 - 41 U/L 35  41  62   ALT 0 - 44 U/L 34  67  108       RADIOGRAPHIC STUDIES: I have personally reviewed the radiological images as listed and agreed with the findings in the report. No results found.    No orders of the defined types were placed in this encounter.  All questions were answered. The patient knows to call the clinic with any problems, questions or concerns. No barriers to learning was detected. The total time spent in the appointment was 30 minutes.     Malachy Mood, MD 10/07/2023

## 2023-10-07 NOTE — Assessment & Plan Note (Signed)
stage IIB, [name], [name] -Diagnosed 08/24/20 on EUS with Dr Dulce Sellar, which showed mass at head of pancreas, s/p stenting, with SMV and PV invasion, biopsy confirmed adenocarcinoma with few malignant-appearing LNs in peripancreatic and porta hepatis region. Staging was negative for metastatic disease.  -she completed neoadjuvant chemo FOLFIRINOX q2 weeks 09/12/20 - 12/29/20. -s/p whipple surgery by Dr Flonnie Hailstone on 02/08/21, path showed no residual disease.  -due to the high recurrence risk of pancreatic cancer, she is under close surveillance.  -Her PET scan from September 30, 2023 showed hypermetabolic soft tissue mass at the previous Whipple surgical site, and a small mildly hypermetabolic nodule in the left lung, concerning for metastatic recurrence.

## 2023-10-07 NOTE — Telephone Encounter (Signed)
Patient needs Urgent OV with RB, BI or APP for pulmonary nodule.  Thank you

## 2023-10-08 ENCOUNTER — Telehealth: Payer: Self-pay | Admitting: Hematology

## 2023-10-08 LAB — CANCER ANTIGEN 19-9: CA 19-9: 189 U/mL — ABNORMAL HIGH (ref 0–35)

## 2023-10-10 ENCOUNTER — Encounter: Payer: Self-pay | Admitting: Hematology

## 2023-10-10 NOTE — Telephone Encounter (Signed)
Telephone call  

## 2023-10-14 ENCOUNTER — Telehealth: Payer: Self-pay

## 2023-10-14 DIAGNOSIS — C250 Malignant neoplasm of head of pancreas: Secondary | ICD-10-CM

## 2023-10-14 NOTE — Telephone Encounter (Signed)
Spoke with pt via telephone to inform pt that Dr. Mosetta Putt would like the pt to do [facility] since the pt's bx is not scheduled until 10/25/2023.  Explained to pt what [place] [facility] is and what can be obtained from [place] reporting.  Pt verbalized agreement to have the [facility] done.  Pt opted to have mobile phlebotomy since the pt works during the day.  Provided pt w/the telephone number for [facility] to schedule the mobile phlebotomy.  Pt stated she will contact them today around 3pm to schedule her appt.  Pt had no further questions or concerns.  Order placed and faxed to [facility].  Faxed confirmation received.

## 2023-10-15 ENCOUNTER — Other Ambulatory Visit: Payer: Self-pay

## 2023-10-15 NOTE — Progress Notes (Signed)
[facility] sent email needing path sample to compare against.  Spoke with Dr. Mosetta Putt because whipple at [facility] sample was benign.  Dr. Mosetta Putt recommended trying Case#[id number] Cytology.  Sample may not be sufficient to do [facility].

## 2023-10-25 ENCOUNTER — Other Ambulatory Visit: Payer: Self-pay

## 2023-10-25 ENCOUNTER — Encounter: Payer: Self-pay | Admitting: Emergency Medicine

## 2023-10-25 ENCOUNTER — Ambulatory Visit: Payer: No Typology Code available for payment source | Admitting: Emergency Medicine

## 2023-10-25 ENCOUNTER — Encounter (HOSPITAL_COMMUNITY): Payer: Self-pay | Admitting: Emergency Medicine

## 2023-10-25 VITALS — BP 117/77 | HR 60 | Resp 16 | Ht 62.0 in | Wt 131.2 lb

## 2023-10-25 DIAGNOSIS — R911 Solitary pulmonary nodule: Secondary | ICD-10-CM | POA: Diagnosis not present

## 2023-10-25 NOTE — H&P (View-Only) (Signed)
Subjective:    Patient ID: Jacqueline Tyler, female    DOB: [birth year], 62 y.o.   MRN: [id number]  HPI  The patient is a 62 year old individual, never smoker, with a history of stage 2B pancreatic cancer, initially diagnosed in 2021. She has been under close surveillance with regular imaging. A recent PET scan performed on 09/30/2023 revealed a hypermetabolic soft tissue mass at the site of her previous Whipple procedure and a hypermetabolic nodule in the left lung. The patient was not experiencing any new symptoms at the time of the scan. The lung nodule was not present on previous imaging, suggesting it is a new development. The patient is currently considering a biopsy to further investigate these findings.  The patient has been in regular contact with her oncologist, Dr. Mosetta Putt, who has been managing her pancreatic cancer. She has been undergoing regular lab work, with a recent phlebotomy performed at home. The patient is scheduled to review these results, along with the results of the proposed biopsy, with Dr. Mosetta Putt in the near future.  The patient is generally in good health and has not reported any new symptoms or health concerns. She is eager to proceed with the biopsy and is prepared to handle the potential risks associated with the procedure. The patient is scheduled for a bronchoscopy to biopsy the lung nodule. The patient understands the procedure and is prepared for the potential risks, including pneumothorax and post-procedure infection.    RADIOLOGY PET scan: Hypermetabolic soft tissue mass at previous Whipple surgical site; hypermetabolic nodule in left lung; no enlarged axillary, mediastinal, or hilar lymphadenopathy; irregular 1.3 cm anterior left upper lobe pulmonary nodule with low-level uptake; ill-defined 1.9 cm soft tissue mass at superior margin of Whipple procedure surgical site (09/30/2023) CT chest, abdomen, and pelvis: Nodule in left upper lobe (09/17/2023)  Review of  Systems As per HPI  Past Medical History:  Diagnosis Date   Anxiety    Cancer (HCC) 07/2020   pancreatic cancer   Depression    Diabetes mellitus without complication (HCC) 07/2020   pancreatic cancer   High cholesterol    History of blood transfusion    History of hiatal hernia    small   Hypertension    Stroke [facility])    age 49, no residual effect     Family History  Problem Relation Age of Onset   CAD Mother    Cancer Cousin        paternal cousin; unknown type; dx >50   Other Cousin        paternal cousin; brain tumor     Social History   Socioeconomic History   Marital status: Married    Spouse name: Not on file   Number of children: 1   Years of education: Not on file   Highest education level: Not on file  Occupational History   Occupation: Advertising account planner   Tobacco Use   Smoking status: Never   Smokeless tobacco: Never  Vaping Use   Vaping status: Never Used  Substance and Sexual Activity   Alcohol use: Never   Drug use: Never   Sexual activity: Not on file  Other Topics Concern   Not on file  Social History Narrative   Not on file   Social Determinants of Health   Financial Resource Strain: Not on file  Food Insecurity: No Food Insecurity (09/25/2022)   [facility] to [facility]19[facility] visits prior to 01/19/2023.   Hunger Vital Sign    Worried About Running Out of Food in the Last Year: Never true    Ran Out of Food in the Last Year: Never true  Transportation Needs: No Transportation Needs (09/25/2022)   [facility] from [facility] prior to 01/19/2023[facility] prior to 01/19/2023.   PRAPARE - Administrator, Civil Service (Medical): No    Lack of Transportation (Non-Medical): No  Physical Activity: Not on file  Stress: Not on file  Social  Connections: Not on file  Intimate Partner Violence: Not on file     Allergies  Allergen Reactions   Tyloxapol Other (See Comments)    Nausea  Tylox    Oxycodone-Acetaminophen Nausea Only    Patient was unsure / doesn't recall taking    Poison Ivy Extract Hives    Patient states HIGHLY allergice     Outpatient Medications Prior to Visit  Medication Sig Dispense Refill   acetaminophen (TYLENOL) 325 MG tablet Take 650 mg by mouth every 6 (six) hours as needed (pain).     citalopram (CELEXA) 40 MG tablet Take 40 mg by mouth every evening.     CREON 36000-114000 units CPEP capsule TAKE 2 CAPSULES BY MOUTH 3 TIMES A DAY WITH A MEAL. MAY ALSO TAKE 1 CAPSULE AS NEEDED WITH SNACKS 240 capsule 2   hydrochlorothiazide (MICROZIDE) 12.5 MG capsule Take 12.5 mg by mouth in the morning.     magnesium oxide (MAG-OX) 400 (241.3 Mg) MG tablet Take 1 tablet (400 mg total) by mouth 2 (two) times daily. (Patient taking differently: Take 400 mg by mouth every evening.) 60 tablet 0   potassium chloride (KLOR-CON) 10 MEQ tablet TAKE 1 TABLET BY MOUTH EVERY DAY 90 tablet 1   simvastatin (ZOCOR) 80 MG tablet Take 80 mg by mouth every evening.     No facility-administered medications prior to visit.        Objective:   Physical Exam Vitals:   10/25/23 1117  BP: 117/77  Pulse: 60  Resp: 16  SpO2: 97%  Weight: 131 lb 3.2 oz (59.5 kg)  Height: 5\' 2"  (1.575 m)   Gen: Pleasant, well-nourished, in no distress,  normal affect  ENT: No lesions,  mouth clear,  oropharynx clear, no postnasal drip  Neck: No JVD, no stridor  Lungs: No use of accessory muscles, no crackles or wheezing on normal respiration, no wheeze on forced expiration  Cardiovascular: RRR, heart sounds normal, no murmur or gallops, no peripheral edema  Musculoskeletal: No deformities, no cyanosis or clubbing  Neuro: alert, awake, non focal  Skin: Warm, no lesions or rash      Assessment & Plan:   Pulmonary nodule 1 cm or  greater in diameter Pancreatic Cancer Stage 2B, diagnosed in 2021, with recent PET scan showing a hypermetabolic soft tissue mass at the previous Whipple surgical site and a new hypermetabolic nodule in the left lung. The patient is asymptomatic. The lung nodule is suspicious and requires biopsy for definitive diagnosis. -Schedule bronchoscopy with biopsy for Monday, October 28, 2023. -Possible repeat CT chest on the day of the procedure for accurate navigation to the nodule. -Discussed risks of bronchoscopy including pneumothorax, bleeding, and infection. -Plan to review biopsy results with Dr. Mosetta Putt for treatment planning.  General Health Maintenance / Followup Plans -Follow-up with Dr. Mosetta Putt for lab work review and treatment planning  on Thursday, October 31, 2023. -Post-procedure follow-up to monitor for any complications.   Levy Pupa, MD, PhD 10/25/2023, 12:14 PM [facility] [phone] or if no answer before 7:00PM call [phone] For any issues after 7:00PM please call eLink [phone]

## 2023-10-25 NOTE — Progress Notes (Signed)
Subjective:    Patient ID: Jacqueline Tyler, female    DOB: [birth year], 62 y.o.   MRN: [id number]  HPI  The patient is a 62 year old individual, never smoker, with a history of stage 2B pancreatic cancer, initially diagnosed in 2021. She has been under close surveillance with regular imaging. A recent PET scan performed on 09/30/2023 revealed a hypermetabolic soft tissue mass at the site of her previous Whipple procedure and a hypermetabolic nodule in the left lung. The patient was not experiencing any new symptoms at the time of the scan. The lung nodule was not present on previous imaging, suggesting it is a new development. The patient is currently considering a biopsy to further investigate these findings.  The patient has been in regular contact with her oncologist, Dr. Mosetta Putt, who has been managing her pancreatic cancer. She has been undergoing regular lab work, with a recent phlebotomy performed at home. The patient is scheduled to review these results, along with the results of the proposed biopsy, with Dr. Mosetta Putt in the near future.  The patient is generally in good health and has not reported any new symptoms or health concerns. She is eager to proceed with the biopsy and is prepared to handle the potential risks associated with the procedure. The patient is scheduled for a bronchoscopy to biopsy the lung nodule. The patient understands the procedure and is prepared for the potential risks, including pneumothorax and post-procedure infection.    RADIOLOGY PET scan: Hypermetabolic soft tissue mass at previous Whipple surgical site; hypermetabolic nodule in left lung; no enlarged axillary, mediastinal, or hilar lymphadenopathy; irregular 1.3 cm anterior left upper lobe pulmonary nodule with low-level uptake; ill-defined 1.9 cm soft tissue mass at superior margin of Whipple procedure surgical site (09/30/2023) CT chest, abdomen, and pelvis: Nodule in left upper lobe (09/17/2023)  Review of  Systems As per HPI  Past Medical History:  Diagnosis Date   Anxiety    Cancer (HCC) 07/2020   pancreatic cancer   Depression    Diabetes mellitus without complication (HCC) 07/2020   pancreatic cancer   High cholesterol    History of blood transfusion    History of hiatal hernia    small   Hypertension    Stroke [facility])    age 49, no residual effect     Family History  Problem Relation Age of Onset   CAD Mother    Cancer Cousin        paternal cousin; unknown type; dx >50   Other Cousin        paternal cousin; brain tumor     Social History   Socioeconomic History   Marital status: Married    Spouse name: Not on file   Number of children: 1   Years of education: Not on file   Highest education level: Not on file  Occupational History   Occupation: Advertising account planner   Tobacco Use   Smoking status: Never   Smokeless tobacco: Never  Vaping Use   Vaping status: Never Used  Substance and Sexual Activity   Alcohol use: Never   Drug use: Never   Sexual activity: Not on file  Other Topics Concern   Not on file  Social History Narrative   Not on file   Social Determinants of Health   Financial Resource Strain: Not on file  Food Insecurity: No Food Insecurity (09/25/2022)   Received from [facility] prior to [facility]/19/2023[facility] visits prior to 01/19/2023.   Hunger Vital Sign    Worried About Running Out of Food in the Last Year: Never true    Ran Out of Food in the Last Year: Never true  Transportation Needs: No Transportation Needs (09/25/2022)   Received from [facility] visits prior to 01/19/2023[facility] visits prior to 01/19/2023.   PRAPARE - Administrator, Civil Service (Medical): No    Lack of Transportation (Non-Medical): No  Physical Activity: Not on file  Stress: Not on file  Social  Connections: Not on file  Intimate Partner Violence: Not on file     Allergies  Allergen Reactions   Tyloxapol Other (See Comments)    Nausea  Tylox    Oxycodone-Acetaminophen Nausea Only    Patient was unsure / doesn't recall taking    Poison Ivy Extract Hives    Patient states HIGHLY allergice     Outpatient Medications Prior to Visit  Medication Sig Dispense Refill   acetaminophen (TYLENOL) 325 MG tablet Take 650 mg by mouth every 6 (six) hours as needed (pain).     citalopram (CELEXA) 40 MG tablet Take 40 mg by mouth every evening.     CREON 36000-114000 units CPEP capsule TAKE 2 CAPSULES BY MOUTH 3 TIMES A DAY WITH A MEAL. MAY ALSO TAKE 1 CAPSULE AS NEEDED WITH SNACKS 240 capsule 2   hydrochlorothiazide (MICROZIDE) 12.5 MG capsule Take 12.5 mg by mouth in the morning.     magnesium oxide (MAG-OX) 400 (241.3 Mg) MG tablet Take 1 tablet (400 mg total) by mouth 2 (two) times daily. (Patient taking differently: Take 400 mg by mouth every evening.) 60 tablet 0   potassium chloride (KLOR-CON) 10 MEQ tablet TAKE 1 TABLET BY MOUTH EVERY DAY 90 tablet 1   simvastatin (ZOCOR) 80 MG tablet Take 80 mg by mouth every evening.     No facility-administered medications prior to visit.        Objective:   Physical Exam Vitals:   10/25/23 1117  BP: 117/77  Pulse: 60  Resp: 16  SpO2: 97%  Weight: 131 lb 3.2 oz (59.5 kg)  Height: 5\' 2"  (1.575 m)   Gen: Pleasant, well-nourished, in no distress,  normal affect  ENT: No lesions,  mouth clear,  oropharynx clear, no postnasal drip  Neck: No JVD, no stridor  Lungs: No use of accessory muscles, no crackles or wheezing on normal respiration, no wheeze on forced expiration  Cardiovascular: RRR, heart sounds normal, no murmur or gallops, no peripheral edema  Musculoskeletal: No deformities, no cyanosis or clubbing  Neuro: alert, awake, non focal  Skin: Warm, no lesions or rash      Assessment & Plan:   Pulmonary nodule 1 cm or  greater in diameter Pancreatic Cancer Stage 2B, diagnosed in 2021, with recent PET scan showing a hypermetabolic soft tissue mass at the previous Whipple surgical site and a new hypermetabolic nodule in the left lung. The patient is asymptomatic. The lung nodule is suspicious and requires biopsy for definitive diagnosis. -Schedule bronchoscopy with biopsy for Monday, October 28, 2023. -Possible repeat CT chest on the day of the procedure for accurate navigation to the nodule. -Discussed risks of bronchoscopy including pneumothorax, bleeding, and infection. -Plan to review biopsy results with Dr. Mosetta Putt for treatment planning.  General Health Maintenance / Followup Plans -Follow-up with Dr. Mosetta Putt for lab work review and treatment planning  on Thursday, October 31, 2023. -Post-procedure follow-up to monitor for any complications.   Levy Pupa, MD, PhD 10/25/2023, 12:14 PM [facility] [phone] or if no answer before 7:00PM call [phone] For any issues after 7:00PM please call eLink [phone]

## 2023-10-25 NOTE — Patient Instructions (Signed)
We reviewed your CT scan and your PET scan today. We will arrange for a robotic assisted navigational bronchoscopy to evaluate a left upper lobe pulmonary nodule.  This will be done under general anesthesia as an outpatient at [facility] endoscopy.  You will need a designated driver and someone to watch you on that day after the procedure.  We will try to get this scheduled for 10/28/2023. Follow with Dr. Mosetta Putt as planned next week to review results

## 2023-10-25 NOTE — Progress Notes (Signed)
PCP - Hope Pigeon Cardiologist - Denies  EKG - DOS Chest x-ray - Denies ECHO - Denies Cardiac Cath - Denies  Sleep Study-Denies   DM - Denies  ERAS Protcol - NPO COVID TEST- N/A -------------  SDW INSTRUCTIONS:  Your procedure is scheduled on Monday Dec 9th. Please report to [facility] Main Entrance "A" at 1145 A.M., and check in at the Admitting office. Call this number if you have problems the morning of surgery: [phone]   Remember: Do not eat or drink after midnight the night before your surgery    Medications to take morning of surgery with a sip of water include:   IF NEEDED take acetaminophen (TYLENOL)   As of today, STOP taking any Aspirin (unless otherwise instructed by your surgeon), Aleve, Naproxen, Ibuprofen, Motrin, Advil, Goody's, BC's, all herbal medications, fish oil, and all vitamins.    The Morning of Surgery Do not wear jewelry, make-up or nail polish. Do not wear lotions, powders, or perfumes/colognes, or deodorant Do not bring valuables to the hospital. [facility] is not responsible for any belongings or valuables.  If you are a smoker, DO NOT Smoke 24 hours prior to surgery  If you wear a CPAP at night please bring your mask the morning of surgery   Remember that you must have someone to transport you home after your surgery, and remain with you for 24 hours if you are discharged the same day.  Please bring cases for contacts, glasses, hearing aids, dentures or bridgework because it cannot be worn into surgery.   Patients discharged the day of surgery will not be allowed to drive home.   Please shower the NIGHT BEFORE/MORNING OF SURGERY (use antibacterial soap like DIAL soap if possible). Wear comfortable clothes the morning of surgery. Oral Hygiene is also important to reduce your risk of infection.  Remember - BRUSH YOUR TEETH THE MORNING OF SURGERY WITH YOUR REGULAR TOOTHPASTE  Patient denies shortness of breath, fever, cough and  chest pain.

## 2023-10-25 NOTE — Assessment & Plan Note (Signed)
Pancreatic Cancer Stage 2B, diagnosed in 2021, with recent PET scan showing a hypermetabolic soft tissue mass at the previous Whipple surgical site and a new hypermetabolic nodule in the left lung. The patient is asymptomatic. The lung nodule is suspicious and requires biopsy for definitive diagnosis. -Schedule bronchoscopy with biopsy for Monday, October 28, 2023. -Possible repeat CT chest on the day of the procedure for accurate navigation to the nodule. -Discussed risks of bronchoscopy including pneumothorax, bleeding, and infection. -Plan to review biopsy results with Dr. Mosetta Putt for treatment planning.  General Health Maintenance / Followup Plans -Follow-up with Dr. Mosetta Putt for lab work review and treatment planning on Thursday, October 31, 2023. -Post-procedure follow-up to monitor for any complications.

## 2023-10-28 ENCOUNTER — Other Ambulatory Visit: Payer: Self-pay

## 2023-10-28 ENCOUNTER — Ambulatory Visit (HOSPITAL_COMMUNITY): Payer: No Typology Code available for payment source | Admitting: Anesthesiology

## 2023-10-28 ENCOUNTER — Ambulatory Visit (HOSPITAL_COMMUNITY)
Admission: RE | Admit: 2023-10-28 | Discharge: 2023-10-28 | Disposition: A | Discharge status: Home / Self Care | Payer: No Typology Code available for payment source | Attending: Emergency Medicine | Admitting: Emergency Medicine

## 2023-10-28 ENCOUNTER — Ambulatory Visit (HOSPITAL_COMMUNITY): Payer: No Typology Code available for payment source

## 2023-10-28 ENCOUNTER — Encounter (HOSPITAL_COMMUNITY): Payer: Self-pay | Admitting: Emergency Medicine

## 2023-10-28 ENCOUNTER — Encounter (HOSPITAL_COMMUNITY)
Admission: RE | Disposition: A | Discharge status: Home / Self Care | Payer: Self-pay | Source: Home / Self Care | Attending: Emergency Medicine

## 2023-10-28 DIAGNOSIS — I10 Essential (primary) hypertension: Secondary | ICD-10-CM | POA: Diagnosis not present

## 2023-10-28 DIAGNOSIS — C3412 Malignant neoplasm of upper lobe, left bronchus or lung: Secondary | ICD-10-CM | POA: Insufficient documentation

## 2023-10-28 DIAGNOSIS — E119 Type 2 diabetes mellitus without complications: Secondary | ICD-10-CM | POA: Diagnosis not present

## 2023-10-28 DIAGNOSIS — R911 Solitary pulmonary nodule: Secondary | ICD-10-CM | POA: Diagnosis present

## 2023-10-28 DIAGNOSIS — F419 Anxiety disorder, unspecified: Secondary | ICD-10-CM | POA: Diagnosis not present

## 2023-10-28 DIAGNOSIS — Z8673 Personal history of transient ischemic attack (TIA), and cerebral infarction without residual deficits: Secondary | ICD-10-CM | POA: Insufficient documentation

## 2023-10-28 DIAGNOSIS — Z8507 Personal history of malignant neoplasm of pancreas: Secondary | ICD-10-CM | POA: Diagnosis not present

## 2023-10-28 DIAGNOSIS — Z90411 Acquired partial absence of pancreas: Secondary | ICD-10-CM | POA: Insufficient documentation

## 2023-10-28 DIAGNOSIS — Z8249 Family history of ischemic heart disease and other diseases of the circulatory system: Secondary | ICD-10-CM | POA: Diagnosis not present

## 2023-10-28 HISTORY — PX: BRONCHIAL BRUSHINGS: SHX5108

## 2023-10-28 HISTORY — PX: FIDUCIAL MARKER PLACEMENT: SHX6858

## 2023-10-28 HISTORY — PX: BRONCHIAL BIOPSY: SHX5109

## 2023-10-28 HISTORY — PX: BRONCHIAL NEEDLE ASPIRATION BIOPSY: SHX5106

## 2023-10-28 SURGERY — BRONCHOSCOPY, WITH BIOPSY USING ELECTROMAGNETIC NAVIGATION
Anesthesia: General

## 2023-10-28 MED ORDER — MAGNESIUM OXIDE 400 (241.3 MG) MG PO TABS: 400.0000 mg | ORAL_TABLET | Freq: Every evening | ORAL | Status: DC

## 2023-10-28 MED ORDER — SUGAMMADEX SODIUM 200 MG/2ML IV SOLN
INTRAVENOUS | Status: DC | PRN
  Administered 2023-10-28: 200 mg via INTRAVENOUS

## 2023-10-28 MED ORDER — ROCURONIUM BROMIDE 10 MG/ML (PF) SYRINGE
PREFILLED_SYRINGE | INTRAVENOUS | Status: DC | PRN
  Administered 2023-10-28: 50 mg via INTRAVENOUS
  Administered 2023-10-28: 20 mg via INTRAVENOUS

## 2023-10-28 MED ORDER — ONDANSETRON HCL 4 MG/2ML IJ SOLN: 4.0000 mg | Freq: Once | INTRAMUSCULAR | Status: DC | PRN

## 2023-10-28 MED ORDER — LABETALOL HCL 5 MG/ML IV SOLN
INTRAVENOUS | Status: DC | PRN
  Administered 2023-10-28: 10 mg via INTRAVENOUS

## 2023-10-28 MED ORDER — FENTANYL CITRATE (PF) 100 MCG/2ML IJ SOLN
INTRAMUSCULAR | Status: AC
  Filled 2023-10-28: qty 2

## 2023-10-28 MED ORDER — CHLORHEXIDINE GLUCONATE 0.12 % MT SOLN
15.0000 mL | Freq: Once | OROMUCOSAL | Status: AC
  Administered 2023-10-28: 15 mL via OROMUCOSAL
  Filled 2023-10-28: qty 15

## 2023-10-28 MED ORDER — LIDOCAINE 2% (20 MG/ML) 5 ML SYRINGE
INTRAMUSCULAR | Status: DC | PRN
  Administered 2023-10-28: 100 mg via INTRAVENOUS

## 2023-10-28 MED ORDER — SODIUM CHLORIDE 0.9 % IV SOLN: INTRAVENOUS | Status: DC

## 2023-10-28 MED ORDER — PROPOFOL 10 MG/ML IV BOLUS
INTRAVENOUS | Status: DC | PRN
  Administered 2023-10-28: 120 mg via INTRAVENOUS

## 2023-10-28 MED ORDER — ACETAMINOPHEN 10 MG/ML IV SOLN: 1000.0000 mg | Freq: Once | INTRAVENOUS | Status: DC | PRN

## 2023-10-28 MED ORDER — FENTANYL CITRATE (PF) 100 MCG/2ML IJ SOLN: 25.0000 ug | INTRAMUSCULAR | Status: DC | PRN

## 2023-10-28 MED ORDER — FENTANYL CITRATE (PF) 250 MCG/5ML IJ SOLN
INTRAMUSCULAR | Status: DC | PRN
  Administered 2023-10-28 (×2): 50 ug via INTRAVENOUS

## 2023-10-28 MED ORDER — ONDANSETRON HCL 4 MG/2ML IJ SOLN
INTRAMUSCULAR | Status: DC | PRN
  Administered 2023-10-28: 4 mg via INTRAVENOUS

## 2023-10-28 SURGICAL SUPPLY — 1 items: superlock fiducial marker IMPLANT

## 2023-10-28 NOTE — Anesthesia Procedure Notes (Signed)
Procedure Name: Intubation Date/Time: 10/28/2023 3:15 PM  Performed by: Sandie Ano, CRNAPre-anesthesia Checklist: Patient identified, Emergency Drugs available, Suction available and Patient being monitored Patient Re-evaluated:Patient Re-evaluated prior to induction Oxygen Delivery Method: Circle System Utilized Preoxygenation: Pre-oxygenation with 100% oxygen Induction Type: IV induction Ventilation: Mask ventilation without difficulty Laryngoscope Size: Mac and 3 Grade View: Grade II Tube type: Oral Tube size: 7.5 mm Number of attempts: 1 Airway Equipment and Method: Stylet and Oral airway Placement Confirmation: ETT inserted through vocal cords under direct vision, positive ETCO2 and breath sounds checked- equal and bilateral Secured at: 23 cm Tube secured with: Tape Dental Injury: Teeth and Oropharynx as per pre-operative assessment

## 2023-10-28 NOTE — Interval H&P Note (Signed)
History and Physical Interval Note:  10/28/2023 1:10 PM  Jacqueline Tyler  has presented today for surgery, with the diagnosis of left upper lobe nodule.  The various methods of treatment have been discussed with the patient and family. After consideration of risks, benefits and other options for treatment, the patient has consented to  Procedure(s): ROBOTIC ASSISTED NAVIGATIONAL BRONCHOSCOPY (N/A) as a surgical intervention.  The patient's history has been reviewed, patient examined, no change in status, stable for surgery.  I have reviewed the patient's chart and labs.  Questions were answered to the patient's satisfaction.     Leslye Peer

## 2023-10-28 NOTE — Discharge Instructions (Signed)
 Flexible Bronchoscopy, Care After This sheet gives you information about how to care for yourself after your test. Your doctor may also give you more specific instructions. If you have problems or questions, contact your doctor. Follow these instructions at home: Eating and drinking When your numbness is gone and your cough and gag reflexes have come back, you may: Eat only soft foods. Slowly drink liquids. When you get home after the test, go back to your normal diet. Driving Do not drive for 24 hours if you were given a medicine to help you relax (sedative). Do not drive or use heavy machinery while taking prescription pain medicine. General instructions  Take over-the-counter and prescription medicines only as told by your doctor. Return to your normal activities as told. Ask what activities are safe for you. Do not use any products that have nicotine or tobacco in them. This includes cigarettes and e-cigarettes. If you need help quitting, ask your doctor. Keep all follow-up visits as told by your doctor. This is important. It is very important if you had a tissue sample (biopsy) taken. Get help right away if: You have shortness of breath that gets worse. You get light-headed. You feel like you are going to pass out (faint). You have chest pain. You cough up: More than a little blood. More blood than before. Summary Do not eat or drink anything (not even water) for 2 hours after your test, or until your numbing medicine wears off. Do not use cigarettes. Do not use e-cigarettes. Get help right away if you have chest pain.  Please call our office for any questions or concerns.  [phone].  This information is not intended to replace advice given to you by your health care provider. Make sure you discuss any questions you have with your health care provider. Document Released: 09/02/2009 Document Revised: 10/18/2017 Document Reviewed: 11/23/2016 Elsevier Patient Education  2020  [employer].

## 2023-10-28 NOTE — Anesthesia Postprocedure Evaluation (Signed)
Anesthesia Post Note  Patient: Jacqueline Tyler  Procedure(s) Performed: ROBOTIC ASSISTED NAVIGATIONAL BRONCHOSCOPY BRONCHIAL NEEDLE ASPIRATION BIOPSIES BRONCHIAL BRUSHINGS BRONCHIAL BIOPSIES FIDUCIAL MARKER PLACEMENT     Patient location during evaluation: PACU Anesthesia Type: General Level of consciousness: awake and alert Pain management: pain level controlled Vital Signs Assessment: post-procedure vital signs reviewed and stable Respiratory status: spontaneous breathing, nonlabored ventilation, respiratory function stable and patient connected to nasal cannula oxygen Cardiovascular status: blood pressure returned to baseline and stable Postop Assessment: no apparent nausea or vomiting Anesthetic complications: no   No notable events documented.  Last Vitals:  Vitals:   10/28/23 1630 10/28/23 1643  BP: (!) 125/41 (!) 120/50  Pulse: 80 80  Resp: 16 16  Temp:  36.7 C  SpO2: 91% 93%    Last Pain:  Vitals:   10/28/23 1643  TempSrc:   PainSc: 0-No pain                 Trevor Iha

## 2023-10-28 NOTE — Transfer of Care (Signed)
Immediate Anesthesia Transfer of Care Note  Patient: Jacqueline Tyler  Procedure(s) Performed: ROBOTIC ASSISTED NAVIGATIONAL BRONCHOSCOPY BRONCHIAL NEEDLE ASPIRATION BIOPSIES BRONCHIAL BRUSHINGS BRONCHIAL BIOPSIES FIDUCIAL MARKER PLACEMENT  Patient Location: PACU  Anesthesia Type:General  Level of Consciousness: awake, alert , and oriented  Airway & Oxygen Therapy: Patient Spontanous Breathing  Post-op Assessment: Report given to RN and Post -op Vital signs reviewed and stable  Post vital signs: Reviewed and stable  Last Vitals:  Vitals Value Taken Time  BP    Temp    Pulse    Resp    SpO2      Last Pain:  Vitals:   10/28/23 1225  TempSrc:   PainSc: 0-No pain         Complications: No notable events documented.

## 2023-10-28 NOTE — Anesthesia Preprocedure Evaluation (Addendum)
Anesthesia Evaluation  Patient identified by MRN, date of birth, ID band Patient awake    Reviewed: Allergy & Precautions, NPO status , Patient's Chart, lab work & pertinent test results  Airway Mallampati: II  TM Distance: >3 FB Neck ROM: Full    Dental no notable dental hx. (+) Teeth Intact, Dental Advisory Given   Pulmonary neg pulmonary ROS   Pulmonary exam normal breath sounds clear to auscultation       Cardiovascular hypertension, Normal cardiovascular exam Rhythm:Regular Rate:Normal     Neuro/Psych   Anxiety     CVA, No Residual Symptoms negative neurological ROS     GI/Hepatic hiatal hernia,GERD  ,,Stage 2 b pancreatic CA S/P whipple   Endo/Other  diabetes    Renal/GU Lab Results      Component                Value               Date                                K                        3.9                 10/07/2023                CO2                      27                  10/07/2023                BUN                      16                  10/07/2023                CREATININE               0.98                10/07/2023                 negative genitourinary   Musculoskeletal negative musculoskeletal ROS (+)    Abdominal   Peds  Hematology Lab Results      Component                Value               Date                      WBC                      6.8                 10/07/2023                HGB                      13.0                10/07/2023  HCT                      39.4                10/07/2023                MCV                      87.8                10/07/2023                PLT                      250                 10/07/2023              Anesthesia Other Findings All: oxycodone-acetaminophen  Reproductive/Obstetrics                             Anesthesia Physical Anesthesia Plan  ASA: 3  Anesthesia Plan: General   Post-op Pain  Management:    Induction: Intravenous  PONV Risk Score and Plan: 3 and Treatment may vary due to age or medical condition and Ondansetron  Airway Management Planned: Oral ETT  Additional Equipment: None  Intra-op Plan:   Post-operative Plan: Extubation in OR  Informed Consent: I have reviewed the patients History and Physical, chart, labs and discussed the procedure including the risks, benefits and alternatives for the proposed anesthesia with the patient or authorized representative who has indicated his/her understanding and acceptance.     Dental advisory given  Plan Discussed with:   Anesthesia Plan Comments:        Anesthesia Quick Evaluation

## 2023-10-28 NOTE — Op Note (Signed)
Video Bronchoscopy with Robotic Assisted Bronchoscopic Navigation   Date of Operation: 10/28/2023   Pre-op Diagnosis: Left upper lobe nodule  Post-op Diagnosis: Same  Surgeon: Levy Pupa  Assistants: None  Anesthesia: General endotracheal anesthesia  Operation: Flexible video fiberoptic bronchoscopy with robotic assistance and biopsies.  Estimated Blood Loss: Minimal  Complications: None  Indications and History: Jacqueline Tyler is a 62 y.o. female with history of pancreatic cancer.  She was found to have a hypermetabolic left upper lobe nodule on surveillance imaging.  Recommendation made to achieve a tissue diagnosis via robotic assisted navigational bronchoscopy. The risks, benefits, complications, treatment options and expected outcomes were discussed with the patient.  The possibilities of pneumothorax, pneumonia, reaction to medication, pulmonary aspiration, perforation of a viscus, bleeding, failure to diagnose a condition and creating a complication requiring transfusion or operation were discussed with the patient who freely signed the consent.    Description of Procedure: The patient was seen in the Preoperative Area, was examined and was deemed appropriate to proceed.  The patient was taken to [facility] endoscopy room 3, identified as Jacqueline Tyler and the procedure verified as Flexible Video Fiberoptic Bronchoscopy.  A Time Out was held and the above information confirmed.   Prior to the date of the procedure a high-resolution CT scan of the chest was performed. Utilizing ION software program a virtual tracheobronchial tree was generated to allow the creation of distinct navigation pathways to the patient's parenchymal abnormalities. After being taken to the operating room general anesthesia was initiated and the patient  was orally intubated. The video fiberoptic bronchoscope was introduced via the endotracheal tube and a general inspection was performed which showed normal right and left  lung anatomy. Aspiration of the bilateral mainstems was completed to remove any remaining secretions. Robotic catheter inserted into patient's endotracheal tube.   Target #1 left upper lobe pulmonary nodule: The distinct navigation pathways prepared prior to this procedure were then utilized to navigate to patient's lesion identified on CT scan. The robotic catheter was secured into place and the vision probe was withdrawn.  Lesion location was approximated using fluoroscopy.  Local registration and targeting was performed using [employer] three-dimensional imaging. Under fluoroscopic guidance transbronchial needle brushings, transbronchial needle biopsies, and transbronchial forceps biopsies were performed to be sent for cytology and pathology.  Under fluoroscopic guidance a single fiducial marker was placed adjacent to the nodule  At the end of the procedure a general airway inspection was performed and there was no evidence of active bleeding. The bronchoscope was removed.  The patient tolerated the procedure well. There was no significant blood loss and there were no obvious complications. A post-procedural chest x-ray is pending.  Samples Target #1: 1. Transbronchial needle brushings from left upper lobe nodule 2. Transbronchial Wang needle biopsies from left upper lobe nodule 3. Transbronchial forceps biopsies from left upper lobe nodule  Plans:  The patient will be discharged from the PACU to home when recovered from anesthesia and after chest x-ray is reviewed. We will review the cytology, pathology and microbiology results with the patient when they become available. Outpatient followup will be with Dr Mosetta Putt.    Levy Pupa, MD, PhD 10/28/2023, 4:08 PM [facility] [phone] or if no answer before 7:00PM call [phone] For any issues after 7:00PM please call eLink [phone]

## 2023-10-29 ENCOUNTER — Encounter (HOSPITAL_COMMUNITY): Payer: Self-pay | Admitting: Emergency Medicine

## 2023-10-31 ENCOUNTER — Inpatient Hospital Stay: Payer: No Typology Code available for payment source | Attending: Hematology | Admitting: Hematology

## 2023-10-31 ENCOUNTER — Other Ambulatory Visit: Payer: Self-pay | Admitting: Pharmacist

## 2023-10-31 ENCOUNTER — Encounter: Payer: Self-pay | Admitting: Hematology

## 2023-10-31 ENCOUNTER — Inpatient Hospital Stay: Payer: No Typology Code available for payment source

## 2023-10-31 ENCOUNTER — Other Ambulatory Visit: Payer: Self-pay

## 2023-10-31 VITALS — BP 122/70 | HR 72 | Temp 97.8°F | Resp 15 | Wt 132.1 lb

## 2023-10-31 DIAGNOSIS — C250 Malignant neoplasm of head of pancreas: Secondary | ICD-10-CM

## 2023-10-31 DIAGNOSIS — Z79899 Other long term (current) drug therapy: Secondary | ICD-10-CM | POA: Insufficient documentation

## 2023-10-31 DIAGNOSIS — Z5111 Encounter for antineoplastic chemotherapy: Secondary | ICD-10-CM | POA: Insufficient documentation

## 2023-10-31 LAB — CBC WITH DIFFERENTIAL (CANCER CENTER ONLY)
Abs Immature Granulocytes: 0.02 10*3/uL (ref 0.00–0.07)
Basophils Absolute: 0.1 10*3/uL (ref 0.0–0.1)
Basophils Relative: 1 %
Eosinophils Absolute: 0.2 10*3/uL (ref 0.0–0.5)
Eosinophils Relative: 3 %
HCT: 42.2 % (ref 36.0–46.0)
Hemoglobin: 13.5 g/dL (ref 12.0–15.0)
Immature Granulocytes: 0 %
Lymphocytes Relative: 24 %
Lymphs Abs: 1.7 10*3/uL (ref 0.7–4.0)
MCH: 28.4 pg (ref 26.0–34.0)
MCHC: 32 g/dL (ref 30.0–36.0)
MCV: 88.7 fL (ref 80.0–100.0)
Monocytes Absolute: 0.6 10*3/uL (ref 0.1–1.0)
Monocytes Relative: 8 %
Neutro Abs: 4.6 10*3/uL (ref 1.7–7.7)
Neutrophils Relative %: 64 %
Platelet Count: 317 10*3/uL (ref 150–400)
RBC: 4.76 MIL/uL (ref 3.87–5.11)
RDW: 13.1 % (ref 11.5–15.5)
WBC Count: 7.3 10*3/uL (ref 4.0–10.5)
nRBC: 0 % (ref 0.0–0.2)

## 2023-10-31 LAB — CMP (CANCER CENTER ONLY)
ALT: 43 U/L (ref 0–44)
AST: 43 U/L — ABNORMAL HIGH (ref 15–41)
Albumin: 4.2 g/dL (ref 3.5–5.0)
Alkaline Phosphatase: 79 U/L (ref 38–126)
Anion gap: 9 (ref 5–15)
BUN: 15 mg/dL (ref 8–23)
CO2: 29 mmol/L (ref 22–32)
Calcium: 9.3 mg/dL (ref 8.9–10.3)
Chloride: 103 mmol/L (ref 98–111)
Creatinine: 0.96 mg/dL (ref 0.44–1.00)
GFR, Estimated: >60 mL/min (ref >60)
Glucose, Bld: 195 mg/dL — ABNORMAL HIGH (ref 70–99)
Potassium: 4.5 mmol/L (ref 3.5–5.1)
Sodium: 141 mmol/L (ref 135–145)
Total Bilirubin: 0.9 mg/dL (ref <1.2)
Total Protein: 7.3 g/dL (ref 6.5–8.1)

## 2023-10-31 MED ORDER — PROCHLORPERAZINE MALEATE 10 MG PO TABS: 10.0000 mg | ORAL_TABLET | Freq: Four times a day (QID) | ORAL | 1 refills | Status: DC | PRN

## 2023-10-31 MED ORDER — ONDANSETRON HCL 8 MG PO TABS: 8.0000 mg | ORAL_TABLET | Freq: Three times a day (TID) | ORAL | 1 refills | Status: DC | PRN

## 2023-10-31 MED ORDER — LIDOCAINE-PRILOCAINE 2.5-2.5 % EX CREA: TOPICAL_CREAM | CUTANEOUS | 3 refills | Status: DC

## 2023-10-31 MED ORDER — DEXAMETHASONE 4 MG PO TABS: 8.0000 mg | ORAL_TABLET | Freq: Every day | ORAL | 5 refills | Status: DC

## 2023-10-31 NOTE — Progress Notes (Signed)
[facility]   [phone] [phone]) [phone]   Clinic Follow up Note   Patient Care Team: Leola Brazil, DO as PCP - General (Internal Medicine) Malachy Mood, MD as Consulting Physician (Oncology) Fritzi Mandes, MD as Consulting Physician (General Surgery)  Date of Service:  10/31/2023  CHIEF COMPLAINT: f/u of biopsy results   CURRENT THERAPY:  Pending chemotherapy  Oncology History   Pancreatic cancer (HCC) stage IIB, [name], [name] -Diagnosed 08/24/20 on EUS with Dr Dulce Sellar, which showed mass at head of pancreas, s/p stenting, with SMV and PV invasion, biopsy confirmed adenocarcinoma with few malignant-appearing LNs in peripancreatic and porta hepatis region. Staging was negative for metastatic disease.  -she completed neoadjuvant chemo FOLFIRINOX q2 weeks 09/12/20 - 12/29/20. -s/p whipple surgery by Dr Flonnie Hailstone on 02/08/21, path showed no residual disease.  -due to the high recurrence risk of pancreatic cancer, she is under close surveillance.  -Her PET scan from September 30, 2023 showed hypermetabolic soft tissue mass at the previous Whipple surgical site, and a small mildly hypermetabolic nodule in the left lung, concerning for metastatic recurrence. -lung biopsy 10/28/2023 confirmed adenocarcinoma, IHC pending to conform the origin     Assessment and Plan    Metastatic Adenocarcinoma Confirmed adenocarcinoma in the lung via biopsy, likely metastatic recurrence from previous pancreatic adenocarcinoma. PET scan shows soft tissue at the previous surgical site and a 1.3 cm lung nodule in the left lung, indicating high likelihood of microscopic disease. Discussed aggressive nature of pancreatic cancer, low cure rate (30%), and potential for local and distant recurrence even with complete surgical removal. Treatment options include chemotherapy and possibly radiation. If the lung nodule is primary lung cancer, treatment would involve lung radiation and  pancreatic chemotherapy. Discussed NALIRIFOX regimen, its effectiveness, and side effects including diarrhea. Explained need for port placement and potential use of liposomal irinotecan, which may cause slightly more diarrhea. Anticipated chemotherapy duration is 3-6 months with follow-up based on response and tolerance. - Request port placement - Start chemotherapy with NARILIFOX regimen (every two weeks) post port placement - Consider consolidation radiation therapy after three to 6 months of chemotherapy depending on response - Call with biopsy results tomorrow - Schedule chemotherapy around November 14, 2023, depending on port placement - Provide nausea medication prescription - Continue Creon for pancreatic enzyme replacement - Discuss short-term and long-term disability options  General Health Maintenance Emphasized managing side effects and maintaining overall health during treatment. Discussed financial assistance programs for expensive medications. - Ensure access to financial assistance programs for medications - Monitor for side effects such as diarrhea and provide appropriate management - Encourage regular follow-ups and communication of new symptoms  Plan -- Call with biopsy IHC results tomorrow - Schedule chemotherapy NARILIFOX  around November 14, 2023, depending on port placement - Coordinate with Dr. Eliot Ford office for port placement - Adjust chemotherapy schedule based on port placement date - Follow up after first treatment.         SUMMARY OF ONCOLOGIC HISTORY: Oncology History Overview Note  Cancer Staging Pancreatic cancer [facility]) Staging form: Exocrine Pancreas, AJCC 8th Edition - Clinical stage from 08/24/2020: Stage IIB (cT2, cN1, cM0) - Signed by Malachy Mood, MD on 08/30/2020 Stage prefix: Initial diagnosis    Pancreatic cancer (HCC)  08/20/2020 Imaging   CT AP 08/20/20  IMPRESSION: 1. 3.3 x 2.2 cm low density mass is noted in the pancreatic head consistent  with malignancy. This mass appears to be leading to occlusion of the superior  mesenteric vein as well as the proximal portion of the main portal vein. Collateral circulation is noted. There is moderate intrahepatic and extrahepatic biliary dilatation which appears to be due to the pancreatic head mass. Pancreatic ductal dilatation is noted as well. 2. Probable 2.7 cm uterine fibroid. 3. Aortic atherosclerosis.   Aortic Atherosclerosis (ICD10-I70.0).   08/20/2020 Tumor Marker   Ca19-9 - 927   08/22/2020 Procedure   ERCP by Dr Ewing Schlein 08/22/20  IMPRESSION - The major papilla appeared normal. - A biliary sphincterotomy was performed. - Cells for cytology obtained in the lower third and middle of the main duct. - One plastic stent was placed into the common bile duct.   FINAL MICROSCOPIC DIAGNOSIS:  - No malignant cells identified  - Benign reactive/reparative changes   08/24/2020 Cancer Staging   Staging form: Exocrine Pancreas, AJCC 8th Edition - Clinical stage from 08/24/2020: Stage IIB (cT2, cN1, cM0) - Signed by Malachy Mood, MD on 08/30/2020   08/24/2020 Procedure   EUS by Dr Dulce Sellar 08/24/20  IMPRESSION - There was no sign of significant pathology in the ampulla. - A few malignant-appearing lymph nodes were visualized in the peripancreatic region and porta hepatis region. - One stent was visualized endosonographically in the common bile duct. - A mass was identified in the pancreatic head. Tissue was obtained from this exam. The preliminary diagnosis is consistent with adenocarcinoma. Invasion into SMV/PV seen. Lymphadenopathy noted. This was staged T3 N1 Mx by endosonographic criteria. Fine needle aspiration performed.   08/24/2020 Initial Biopsy   FINAL MICROSCOPIC DIAGNOSIS: 08/24/20 - Malignant cells consistent with adenocarcinoma    08/30/2020 Initial Diagnosis   Pancreatic cancer (HCC)   09/05/2020 Imaging   CT Chest  IMPRESSION: 1. Stable 2.7 cm infiltrating pancreatic  head mass with borderline enlarged peripancreatic lymph nodes. 2. No findings for pulmonary metastatic disease. 3. Small hiatal hernia. 4. Aortic atherosclerosis.   Aortic Atherosclerosis (ICD10-I70.0).   09/08/2020 Procedure   INSERTION PORT-A-CATH by Dr Freida Busman and Donell Beers    09/12/2020 - 12/29/2020 Chemotherapy   Neoadjuvant FOLFIRINOX q2 weeks starting 09/12/20-12/29/20    12/02/2020 Imaging   CT CAP  IMPRESSION: 1. Previously noted mass of the central pancreatic head is almost entirely resolved, difficult to discretely appreciate on current examination. Findings are consistent with treatment response. There remains obstruction of the pancreatic duct near the head neck junction with mild prominence of the pancreatic duct, measuring up to 5 mm, with atrophy of the distal pancreatic parenchyma. 2. The portal vein, splenic vein, and superior mesenteric vein are now widely patent, previously effaced at the confluence by mass effect. 3. Interval placement of common bile duct stent, tip positioned in the distal duodenum, with relief of previously seen biliary ductal dilatation. 4. 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 Following the bifurcation of the celiac mesenteric trunk, the celiac axis appears to directly traverse the vicinity of the mass, although there does appear to be a fat plane about the vessel. 5. The distal small bowel and colon are diffusely somewhat hyperenhancing and inflamed appearing with vascular combing and a tethered appearance of the distal small bowel. This appearance generally suggests inflammatory bowel disease such as Crohn's disease. Correlate with referable clinical history, if present. No evidence of obstruction or other acute complication. 6. Hepatic steatosis. 7. Aortic  atherosclerosis.   12/28/2020 Genetic Testing   Negative  hereditary cancer genetic testing: no pathogenic variants detected in [employer] Common Hereditary Cancers Panel.  The report date is December 28, 2020.    The Common Hereditary Cancers Panel offered by [employer] includes sequencing and/or deletion duplication testing of the following 47 genes: APC, ATM, AXIN2, BARD1, BMPR1A, BRCA1, BRCA2, BRIP1, CDH1, CDK4, CDKN2A (p14ARF), CDKN2A (p16INK4a), CHEK2, CTNNA1, DICER1, EPCAM (Deletion/duplication testing only), GREM1 (promoter region deletion/duplication testing only), GREM1, HOXB13, KIT, MEN1, MLH1, MSH2, MSH3, MSH6, MUTYH, NBN, NF1, NHTL1, PALB2, PDGFRA, PMS2, POLD1, POLE, PTEN, RAD50, RAD51C, RAD51D, SDHA, SDHB, SDHC, SDHD, SMAD4, SMARCA4. STK11, TP53, TSC1, TSC2, and VHL.  The following genes were evaluated for sequence changes only: SDHA and HOXB13 c.251G>A variant only.   01/17/2021 Imaging   CT CAP from [facility] IMPRESSION Small lesion in the pancreatic head measuring approximately 1.1 x 0.8 cm without evidence of vascular involvement or metastasis consistent with previously described, biopsy-proven pancreatic adenocarcinoma.   02/08/2021 Surgery   Whipple Surgery with Dr Deveron Furlong  Final Pathologic Diagnosis      A.  GALLBLADDER, CHOLECYSTECTOMY: Chronic cholecystitis. No malignancy identified.   B.  BILE DUCT STENT, REMOVAL (GROSS ONLY DIAGNOSIS): Stent, see gross description.   C.  WHIPPLE RESECTION: No residual malignancy identified. Chronic and acute inflammation with granulation tissue reaction, fibrosis and features of chronic pancreatitis, suggestive of therapy effect. Fourteen lymph nodes, negative for metastasis (0/14). Margins negative for malignancy.        06/07/2021 Imaging   CT AP  IMPRESSION: 1. No current findings of recurrent malignancy. Interval Whipple procedure with expected postoperative findings. 2. A 3.5 cm stent is present in the dorsal pancreatic duct. No  duct dilatation. There is an approximately 1.5 mm lucency centrally along this stent shown on image 43 series 7, possibilities include stent fracture, two separate stents, or an intentional radial lucency in this type of stent. 3. Small type 1 hiatal hernia. Mild distal esophageal wall thickening may reflect low-grade esophagitis. 4.  Aortic Atherosclerosis (ICD10-I70.0). 5.  Prominent stool throughout the colon favors constipation. 6. Uterine fibroid. 7. 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   11/14/2023 -  Chemotherapy   Patient is on Treatment Plan : PANCREAS NALIRIFOX D1, 15 Q28D     Port-A-Cath in place     Discussed the use of AI scribe software for clinical note transcription with the patient, who gave verbal consent to proceed.  History of Present Illness   A 62 year old patient with a history of pancreatic cancer presents for a follow-up visit after a recent lung biopsy. The patient reports no complications or pain from the biopsy, but mentions a persistent deep cough. No other new symptoms, such as stomach issues, are reported and the patient feels well overall.  The lung biopsy confirmed the presence of adenocarcinoma, a common type of cancer that can occur in the lung and pancreas. The patient's previous pancreatic cancer was also adenocarcinoma, raising the question of whether the lung cancer is related to the previous pancreatic cancer. Additional tests have been requested to determine this.  The patient's PET scan from a month ago showed some soft tissue at the previous surgical site and a 1.3 cm nodule in the lung. The patient had previously undergone chemotherapy for the pancreatic cancer before and after surgery. 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         All other systems were reviewed with the patient and are negative.  MEDICAL  HISTORY:  Past Medical History:  Diagnosis Date    Anxiety    Cancer (HCC) 07/2020   pancreatic cancer   Depression    Diabetes mellitus without complication (HCC) 07/2020   pancreatic cancer   High cholesterol    History of blood transfusion    History of hiatal hernia    small   Hypertension    Stroke [facility])    age 8, no residual effect    SURGICAL HISTORY: Past Surgical History:  Procedure Laterality Date   BILIARY BRUSHING  08/22/2020   Procedure: BILIARY BRUSHING;  Surgeon: Vida Rigger, MD;  Location: [facility] ENDOSCOPY;  Service: Endoscopy;;   BILIARY STENT PLACEMENT N/A 08/22/2020   Procedure: BILIARY STENT PLACEMENT;  Surgeon: Vida Rigger, MD;  Location: [facility] ENDOSCOPY;  Service: Endoscopy;  Laterality: N/A;   BRONCHIAL BIOPSY  10/28/2023   Procedure: BRONCHIAL BIOPSIES;  Surgeon: Leslye Peer, MD;  Location: [facility] ENDOSCOPY;  Service: Pulmonary;;   BRONCHIAL BRUSHINGS  10/28/2023   Procedure: BRONCHIAL BRUSHINGS;  Surgeon: Leslye Peer, MD;  Location: [facility] ENDOSCOPY;  Service: Pulmonary;;   BRONCHIAL NEEDLE ASPIRATION BIOPSY  10/28/2023   Procedure: BRONCHIAL NEEDLE ASPIRATION BIOPSIES;  Surgeon: Leslye Peer, MD;  Location: [facility] ENDOSCOPY;  Service: Pulmonary;;   ENDOSCOPIC RETROGRADE CHOLANGIOPANCREATOGRAPHY (ERCP) WITH PROPOFOL N/A 08/22/2020   Procedure: ENDOSCOPIC RETROGRADE CHOLANGIOPANCREATOGRAPHY (ERCP) WITH PROPOFOL;  Surgeon: Vida Rigger, MD;  Location: [facility] ENDOSCOPY;  Service: Endoscopy;  Laterality: N/A;   ESOPHAGOGASTRODUODENOSCOPY (EGD) WITH PROPOFOL N/A 08/24/2020   Procedure: ESOPHAGOGASTRODUODENOSCOPY (EGD) WITH PROPOFOL;  Surgeon: Willis Modena, MD;  Location: [facility] ENDOSCOPY;  Service: Endoscopy;  Laterality: N/A;   FIDUCIAL MARKER PLACEMENT  10/28/2023   Procedure: FIDUCIAL MARKER PLACEMENT;  Surgeon: Leslye Peer, MD;  Location: [facility] ENDOSCOPY;  Service: Pulmonary;;   FINE NEEDLE ASPIRATION N/A 08/24/2020   Procedure: FINE NEEDLE ASPIRATION (FNA) LINEAR;  Surgeon: Willis Modena, MD;  Location: [facility] ENDOSCOPY;   Service: Endoscopy;  Laterality: N/A;   PORT-A-CATH REMOVAL N/A 12/08/2021   Procedure: REMOVAL PORT-A-CATH;  Surgeon: Fritzi Mandes, MD;  Location[facility] OR;  Service: General;  Laterality: N/A;   PORTACATH PLACEMENT Right 09/08/2020   Procedure: INSERTION PORT-A-CATH;  Surgeon: Fritzi Mandes, MD;  Location: [facility];  Service: General;  Laterality: Right;   SPHINCTEROTOMY  08/22/2020   Procedure: SPHINCTEROTOMY;  Surgeon: Vida Rigger, MD;  Location: [facility] ENDOSCOPY;  Service: Endoscopy;;   UPPER ESOPHAGEAL ENDOSCOPIC ULTRASOUND (EUS) N/A 08/24/2020   Procedure: UPPER ESOPHAGEAL ENDOSCOPIC ULTRASOUND (EUS);  Surgeon: Willis Modena, MD;  Location: [facility] ENDOSCOPY;  Service: Endoscopy;  Laterality: N/A;    I have reviewed the social history and family history with the patient and they are unchanged from previous note.  ALLERGIES:  is allergic to tyloxapol, oxycodone-acetaminophen, and poison ivy extract.  MEDICATIONS:  Current Outpatient Medications  Medication Sig Dispense Refill   acetaminophen (TYLENOL) 325 MG tablet Take 650 mg by mouth every 6 (six) hours as needed (pain).     busPIRone (BUSPAR) 5 MG tablet Take 5 mg by mouth daily.     citalopram (CELEXA) 40 MG tablet Take 40 mg by mouth every evening.     CREON 36000-114000 units CPEP capsule TAKE 2 CAPSULES BY MOUTH 3 TIMES A DAY WITH A MEAL. MAY ALSO TAKE 1 CAPSULE AS NEEDED WITH SNACKS 240 capsule 2   dexamethasone (DECADRON) 4 MG tablet Take 2 tablets (8 mg total) by mouth daily. Start the day after irinotecan chemotherapy for 2 days. Take with  food. 8 tablet 5   hydrochlorothiazide (MICROZIDE) 12.5 MG capsule Take 12.5 mg by mouth in the morning.     lidocaine-prilocaine (EMLA) cream Apply to affected area once 30 g 3   magnesium oxide (MAG-OX) 400 (241.3 Mg) MG tablet Take 1 tablet (400 mg total) by mouth every evening.     ondansetron (ZOFRAN) 8 MG tablet Take 1 tablet (8 mg total) by mouth every 8 (eight) hours as  needed for nausea or vomiting. Start on the third day after irinotecan 30 tablet 1   potassium chloride (KLOR-CON) 10 MEQ tablet TAKE 1 TABLET BY MOUTH EVERY DAY 90 tablet 1   prochlorperazine (COMPAZINE) 10 MG tablet Take 1 tablet (10 mg total) by mouth every 6 (six) hours as needed for nausea or vomiting. 30 tablet 1   simvastatin (ZOCOR) 80 MG tablet Take 80 mg by mouth every evening.     No current facility-administered medications for this visit.    PHYSICAL EXAMINATION: ECOG PERFORMANCE STATUS: 0 - Asymptomatic  Vitals:   10/31/23 0817  BP: 122/70  Pulse: 72  Resp: 15  Temp: 97.8 F (36.6 C)  SpO2: 100%   Wt Readings from Last 3 Encounters:  10/31/23 132 lb 1.6 oz (59.9 kg)  10/28/23 130 lb (59 kg)  10/25/23 131 lb 3.2 oz (59.5 kg)     GENERAL:alert, no distress and comfortable SKIN: skin color, texture, turgor are normal, no rashes or significant lesions EYES: normal, Conjunctiva are pink and non-injected, sclera clear NECK: supple, thyroid normal size, non-tender, without nodularity LYMPH:  no palpable lymphadenopathy in the cervical, axillary  LUNGS: clear to auscultation and percussion with normal breathing effort HEART: regular rate & rhythm and no murmurs and no lower extremity edema ABDOMEN:abdomen soft, non-tender and normal bowel sounds Musculoskeletal:no cyanosis of digits and no clubbing  NEURO: alert & oriented x 3 with fluent speech, no focal motor/sensory deficits  LABORATORY DATA:  I have reviewed the data as listed    Latest Ref Rng & Units 10/31/2023    7:52 AM 10/07/2023    7:41 AM 09/18/2023    7:49 AM  CBC  WBC 4.0 - 10.5 K/uL 7.3  6.8  6.4   Hemoglobin 12.0 - 15.0 g/dL 13.0  86.5  78.4   Hematocrit 36.0 - 46.0 % 42.2  39.4  39.0   Platelets 150 - 400 K/uL 317  250  262         Latest Ref Rng & Units 10/31/2023    7:52 AM 10/07/2023    7:41 AM 09/18/2023    7:49 AM  CMP  Glucose 70 - 99 mg/dL 696  295  97   BUN 8 - 23 mg/dL 15  16   14    Creatinine 0.44 - 1.00 mg/dL 2.84  1.32  4.40   Sodium 135 - 145 mmol/L 141  141  143   Potassium 3.5 - 5.1 mmol/L 4.5  3.9  3.8   Chloride 98 - 111 mmol/L 103  106  107   CO2 22 - 32 mmol/L 29  27  28    Calcium 8.9 - 10.3 mg/dL 9.3  8.8  8.9   Total Protein 6.5 - 8.1 g/dL 7.3  6.7  6.5   Total Bilirubin <1.2 mg/dL 0.9  1.0  0.8   Alkaline Phos 38 - 126 U/L 79  64  89   AST 15 - 41 U/L 43  35  41   ALT 0 - 44 U/L 43  34  67       RADIOGRAPHIC STUDIES: I have personally reviewed the radiological images as listed and agreed with the findings in the report. No results found.    Orders Placed This Encounter  Procedures   Consent Attestation for Oncology Treatment    The patient is informed of risks, benefits, side-effects of the prescribed oncology treatment. Potential short term and long term side effects and response rates discussed. After a long discussion, the patient made informed decision to proceed.:   Yes   CBC with Differential (Cancer Center Only)    Standing Status:   Future    Expected Date:   11/14/2023    Expiration Date:   11/13/2024   CMP (Cancer Center only)    Standing Status:   Future    Expected Date:   11/14/2023    Expiration Date:   11/13/2024   CBC with Differential (Cancer Center Only)    Standing Status:   Future    Expected Date:   11/28/2023    Expiration Date:   11/27/2024   CMP (Cancer Center only)    Standing Status:   Future    Expected Date:   11/28/2023    Expiration Date:   11/27/2024   CBC with Differential (Cancer Center Only)    Standing Status:   Future    Expected Date:   12/12/2023    Expiration Date:   12/11/2024   CMP (Cancer Center only)    Standing Status:   Future    Expected Date:   12/12/2023    Expiration Date:   12/11/2024   CBC with Differential (Cancer Center Only)    Standing Status:   Future    Expected Date:   12/26/2023    Expiration Date:   12/25/2024   CMP (Cancer Center only)    Standing Status:   Future    Expected  Date:   12/26/2023    Expiration Date:   12/25/2024   CBC with Differential (Cancer Center Only)    Standing Status:   Future    Expected Date:   01/09/2024    Expiration Date:   01/08/2025   CMP (Cancer Center only)    Standing Status:   Future    Expected Date:   01/09/2024    Expiration Date:   01/08/2025   CBC with Differential (Cancer Center Only)    Standing Status:   Future    Expected Date:   01/23/2024    Expiration Date:   01/22/2025   CMP (Cancer Center only)    Standing Status:   Future    Expected Date:   01/23/2024    Expiration Date:   01/22/2025   [facility] PHYSICIAN COMMUNICATION 1    0 Number of doses of oxaliplatin received at [facility] or outside facility. signature of Provider. If patient has received greater than 5 doses of oxaliplatin, the following pre-medications should be ordered: dexamethasone, diphenhydramine, and formulary histamine H2 antagonist. If patient cannot tolerate oral histamine H2 antagonist, IV may be given.   All questions were answered. The patient knows to call the clinic with any problems, questions or concerns. No barriers to learning was detected. The total time spent in the appointment was 40 minutes.     Malachy Mood, MD 10/31/2023

## 2023-10-31 NOTE — Assessment & Plan Note (Signed)
stage IIB, [name], [name] -Diagnosed 08/24/20 on EUS with Dr Dulce Sellar, which showed mass at head of pancreas, s/p stenting, with SMV and PV invasion, biopsy confirmed adenocarcinoma with few malignant-appearing LNs in peripancreatic and porta hepatis region. Staging was negative for metastatic disease.  -she completed neoadjuvant chemo FOLFIRINOX q2 weeks 09/12/20 - 12/29/20. -s/p whipple surgery by Dr Flonnie Hailstone on 02/08/21, path showed no residual disease.  -due to the high recurrence risk of pancreatic cancer, she is under close surveillance.  -Her PET scan from September 30, 2023 showed hypermetabolic soft tissue mass at the previous Whipple surgical site, and a small mildly hypermetabolic nodule in the left lung, concerning for metastatic recurrence. -lung biopsy 10/28/2023 confirmed adenocarcinoma, IHC pending to conform the origin

## 2023-11-01 ENCOUNTER — Ambulatory Visit: Payer: Self-pay | Admitting: Surgery

## 2023-11-01 LAB — CYTOLOGY - NON PAP

## 2023-11-01 NOTE — H&P (Signed)
History of Present Illness: Jacqueline Tyler is a 62 y.o. female who is seen today for port placement.  She has a history of pain attic cancer, for which she underwent a Whipple at [facility] in March 2022.  Her surgical pathology showed a complete pathologic response to neoadjuvant chemotherapy.  Her port was removed in January 2023.  She has been undergoing close surveillance, and had a CT scan on 10/29 that showed a mass in the remnant pancreas at the resection margin, as well as a lung nodule, both highly suspicious for recurrent disease.  She had a follow-up PET scan on Levan/11, which showed metabolic uptake in the pancreatic mass and in the lung nodule.  She had a biopsy of the lung nodule on 12/9, which confirmed adenocarcinoma.  IHC is pending to differentiate a lung primary versus metastatic pancreatic cancer, although metastatic recurrent pancreatic cancer is highly suspected.  She has already seen Dr. Blake Divine with plans to start NARILIFOX on 12/26.  She presents today to discuss port placement.  She says she feels well and denies any abdominal pain or other symptoms.  CA 19-9 on 11/18 was elevated to 189.         Review of Systems: A complete review of systems was obtained from the patient.  I have reviewed this information and discussed as appropriate with the patient.  See HPI as well for other ROS.       Medical History: Past Medical History Past Medical History: Diagnosis Date  Anxiety    History of cancer    History of stroke    Hypertension        Problem List Patient Active Problem List Diagnosis  Port-A-Cath in place  History of pancreatic cancer      Past Surgical History Past Surgical History: Procedure Laterality Date  PANCREATICODUODENECTOMY W/PANCREATICOJEJUNOSTOMY WHIPPLE      Portacath placement          Allergies Allergies Allergen Reactions  Oxycodone-Acetaminophen Nausea     Patient was unsure / doesn't recall taking     Poison Ivy Extract Hives      Patient states HIGHLY allergice Patient states HIGHLY allergice    Tyloxapol Other (See Comments)     Nausea  Nausea  Nausea         Medications Ordered Prior to Encounter Current Outpatient Medications on File Prior to Visit Medication Sig Dispense Refill  citalopram (CELEXA) 40 MG tablet Take 40 mg by mouth every evening      hydroCHLOROthiazide (MICROZIDE) 12.5 mg capsule Take 1 capsule by mouth once daily      potassium chloride (KLOR-CON) 10 MEQ ER tablet Take 1 tablet by mouth once daily      simvastatin (ZOCOR) 80 MG tablet Take 80 mg by mouth at bedtime      cloNIDine HCL (CATAPRES) 0.1 MG tablet Take 0.1 mg by mouth at bedtime (Patient not taking: Reported on 11/01/2023)        No current facility-administered medications on file prior to visit.      Family History Family History Problem Relation Age of Onset  High blood pressure (Hypertension) Mother    Coronary Artery Disease (Blocked arteries around heart) Mother    High blood pressure (Hypertension) Father    High blood pressure (Hypertension) Sister        Tobacco Use History Social History    Tobacco Use Smoking Status Never Smokeless Tobacco Never      Social History Social History  Socioeconomic History  Marital status: Unknown Tobacco Use  Smoking status: Never  Smokeless tobacco: Never Vaping Use  Vaping status: Never Used Substance and Sexual Activity  Alcohol use: Never  Drug use: Never    Social Drivers of Health    Food Insecurity: No Food Insecurity (09/25/2022)   Received from [employer], [facility] visits prior to 01/19/2023.   Hunger Vital Sign    Worried About Running Out of Food in the Last Year: Never true    Ran Out of Food in the Last Year: Never true Transportation Needs: No Transportation Needs (09/25/2022)   Received from [facility], [facility] visits prior to 01/19/2023.   PRAPARE - Scientist, research (physical sciences) (Medical): No    Lack of Transportation (Non-Medical): No Housing Stability: Low Risk  (09/25/2022)   Received from [facility] visits prior to 01/19/2023.   Housing Stability Vital Sign    Unable to Pay for Housing in the Last Year: No    Number of Places Lived in the Last Year: 1    In the last 12 months, was there a time when you did not have a steady place to sleep or slept in a shelter (including now)?: No      Objective:     Vitals:   11/01/23 1509 BP: 124/78 Pulse: 64 Temp: 36.8 C (98.2 F) SpO2: 99% Weight: 59 kg (130 lb) Height: 154.9 cm (5\' 1" )   Body mass index is 24.56 kg/m.   Physical Exam Vitals reviewed.  Constitutional:      General: She is not in acute distress.    Appearance: Normal appearance.  HENT:     Head: Normocephalic and atraumatic.  Eyes:     General: No scleral icterus.    Conjunctiva/sclera: Conjunctivae normal.  Pulmonary:     Effort: Pulmonary effort is normal. No respiratory distress.     Comments: Well-healed scar on right upper chest wall. Skin:    General: Skin is warm and dry.  Neurological:     General: No focal deficit present.     Mental Status: She is alert and oriented to person, place, and time.              Assessment and Plan:   Assessment Diagnoses and all orders for this visit:   Recurrent adenocarcinoma of pancreas (CMS/HHS-HCC)       This is a 62 year old female with a history of pancreatic adenocarcinoma, treated with a Whipple and chemotherapy in 2022, now with local recurrence and likely metastatic disease to the lung.  She is to begin systemic chemotherapy and was referred for port placement.  I reviewed the details of Port-A-Cath insertion and the benefits and risks including bleeding, infection, and pneumothorax.  Will likely place a left subclavian port as her previous port was on the right side.  She expressed understanding and agrees to proceed with surgery.  She will  be scheduled for port placement next week.  All questions were answered.  Sophronia Simas, MD [facility] Surgery General, Hepatobiliary and Pancreatic Surgery 11/01/23 3:45 PM

## 2023-11-01 NOTE — H&P (View-Only) (Signed)
History of Present Illness: Jacqueline Tyler is a 62 y.o. female who is seen today for port placement.  She has a history of pain attic cancer, for which she underwent a Whipple at [facility] in March 2022.  Her surgical pathology showed a complete pathologic response to neoadjuvant chemotherapy.  Her port was removed in January 2023.  She has been undergoing close surveillance, and had a CT scan on 10/29 that showed a mass in the remnant pancreas at the resection margin, as well as a lung nodule, both highly suspicious for recurrent disease.  She had a follow-up PET scan on Levan/11, which showed metabolic uptake in the pancreatic mass and in the lung nodule.  She had a biopsy of the lung nodule on 12/9, which confirmed adenocarcinoma.  IHC is pending to differentiate a lung primary versus metastatic pancreatic cancer, although metastatic recurrent pancreatic cancer is highly suspected.  She has already seen Dr. Blake Divine with plans to start NARILIFOX on 12/26.  She presents today to discuss port placement.  She says she feels well and denies any abdominal pain or other symptoms.  CA 19-9 on 11/18 was elevated to 189.         Review of Systems: A complete review of systems was obtained from the patient.  I have reviewed this information and discussed as appropriate with the patient.  See HPI as well for other ROS.       Medical History: Past Medical History Past Medical History: Diagnosis Date  Anxiety    History of cancer    History of stroke    Hypertension        Problem List Patient Active Problem List Diagnosis  Port-A-Cath in place  History of pancreatic cancer      Past Surgical History Past Surgical History: Procedure Laterality Date  PANCREATICODUODENECTOMY W/PANCREATICOJEJUNOSTOMY WHIPPLE      Portacath placement          Allergies Allergies Allergen Reactions  Oxycodone-Acetaminophen Nausea     Patient was unsure / doesn't recall taking     Poison Ivy Extract Hives      Patient states HIGHLY allergice Patient states HIGHLY allergice    Tyloxapol Other (See Comments)     Nausea  Nausea  Nausea         Medications Ordered Prior to Encounter Current Outpatient Medications on File Prior to Visit Medication Sig Dispense Refill  citalopram (CELEXA) 40 MG tablet Take 40 mg by mouth every evening      hydroCHLOROthiazide (MICROZIDE) 12.5 mg capsule Take 1 capsule by mouth once daily      potassium chloride (KLOR-CON) 10 MEQ ER tablet Take 1 tablet by mouth once daily      simvastatin (ZOCOR) 80 MG tablet Take 80 mg by mouth at bedtime      cloNIDine HCL (CATAPRES) 0.1 MG tablet Take 0.1 mg by mouth at bedtime (Patient not taking: Reported on 11/01/2023)        No current facility-administered medications on file prior to visit.      Family History Family History Problem Relation Age of Onset  High blood pressure (Hypertension) Mother    Coronary Artery Disease (Blocked arteries around heart) Mother    High blood pressure (Hypertension) Father    High blood pressure (Hypertension) Sister        Tobacco Use History Social History    Tobacco Use Smoking Status Never Smokeless Tobacco Never      Social History Social History  Socioeconomic History  Marital status: Unknown Tobacco Use  Smoking status: Never  Smokeless tobacco: Never Vaping Use  Vaping status: Never Used Substance and Sexual Activity  Alcohol use: Never  Drug use: Never    Social Drivers of Health    Food Insecurity: No Food Insecurity (09/25/2022)   Received from [employer], [facility] visits prior to 01/19/2023.   Hunger Vital Sign    Worried About Running Out of Food in the Last Year: Never true    Ran Out of Food in the Last Year: Never true Transportation Needs: No Transportation Needs (09/25/2022)   Received from [facility], [facility] visits prior to 01/19/2023.   PRAPARE - Scientist, research (physical sciences) (Medical): No    Lack of Transportation (Non-Medical): No Housing Stability: Low Risk  (09/25/2022)   Received from [facility] visits prior to 01/19/2023.   Housing Stability Vital Sign    Unable to Pay for Housing in the Last Year: No    Number of Places Lived in the Last Year: 1    In the last 12 months, was there a time when you did not have a steady place to sleep or slept in a shelter (including now)?: No      Objective:     Vitals:   11/01/23 1509 BP: 124/78 Pulse: 64 Temp: 36.8 C (98.2 F) SpO2: 99% Weight: 59 kg (130 lb) Height: 154.9 cm (5\' 1" )   Body mass index is 24.56 kg/m.   Physical Exam Vitals reviewed.  Constitutional:      General: She is not in acute distress.    Appearance: Normal appearance.  HENT:     Head: Normocephalic and atraumatic.  Eyes:     General: No scleral icterus.    Conjunctiva/sclera: Conjunctivae normal.  Pulmonary:     Effort: Pulmonary effort is normal. No respiratory distress.     Comments: Well-healed scar on right upper chest wall. Skin:    General: Skin is warm and dry.  Neurological:     General: No focal deficit present.     Mental Status: She is alert and oriented to person, place, and time.              Assessment and Plan:   Assessment Diagnoses and all orders for this visit:   Recurrent adenocarcinoma of pancreas (CMS/HHS-HCC)       This is a 62 year old female with a history of pancreatic adenocarcinoma, treated with a Whipple and chemotherapy in 2022, now with local recurrence and likely metastatic disease to the lung.  She is to begin systemic chemotherapy and was referred for port placement.  I reviewed the details of Port-A-Cath insertion and the benefits and risks including bleeding, infection, and pneumothorax.  Will likely place a left subclavian port as her previous port was on the right side.  She expressed understanding and agrees to proceed with surgery.  She will  be scheduled for port placement next week.  All questions were answered.  Sophronia Simas, MD [facility] Surgery General, Hepatobiliary and Pancreatic Surgery 11/01/23 3:45 PM

## 2023-11-02 ENCOUNTER — Other Ambulatory Visit: Payer: Self-pay

## 2023-11-05 ENCOUNTER — Ambulatory Visit: Payer: No Typology Code available for payment source | Admitting: Acute Care

## 2023-11-05 ENCOUNTER — Encounter (HOSPITAL_COMMUNITY): Payer: Self-pay

## 2023-11-05 ENCOUNTER — Encounter: Payer: Self-pay | Admitting: Acute Care

## 2023-11-05 ENCOUNTER — Encounter (HOSPITAL_COMMUNITY)
Admission: RE | Admit: 2023-11-05 | Discharge: 2023-11-05 | Disposition: A | Discharge status: Home / Self Care | Payer: No Typology Code available for payment source | Source: Ambulatory Visit | Attending: Surgery | Admitting: Surgery

## 2023-11-05 ENCOUNTER — Other Ambulatory Visit: Payer: Self-pay

## 2023-11-05 VITALS — BP 104/67 | HR 75 | Ht 61.0 in | Wt 128.8 lb

## 2023-11-05 VITALS — Ht 61.0 in | Wt 130.0 lb

## 2023-11-05 DIAGNOSIS — Z8507 Personal history of malignant neoplasm of pancreas: Secondary | ICD-10-CM

## 2023-11-05 DIAGNOSIS — C3412 Malignant neoplasm of upper lobe, left bronchus or lung: Secondary | ICD-10-CM | POA: Diagnosis not present

## 2023-11-05 DIAGNOSIS — E119 Type 2 diabetes mellitus without complications: Secondary | ICD-10-CM

## 2023-11-05 DIAGNOSIS — C3490 Malignant neoplasm of unspecified part of unspecified bronchus or lung: Secondary | ICD-10-CM

## 2023-11-05 HISTORY — DX: Anemia, unspecified: D64.9

## 2023-11-05 LAB — SIGNATERA ONLY (NATERA MANAGED)

## 2023-11-05 NOTE — Progress Notes (Signed)
History of Present Illness Jacqueline Tyler is a 62 y.o. female never smoker with history of pancreatic cancer, s/p whipple surgery by Dr Flonnie Hailstone on 02/08/21, referred to Dr. Delton Coombes 10/2023  for consideration of robotic bronchoscopy with biopsies after surveillance imaging showed recurrent pancreatic disease and new lung nodule.  Synopsis Patient is a 62 year old individual, never smoker, with a history of stage 2B pancreatic cancer, initially diagnosed in 2021. She has been under close surveillance with regular imaging. A recent PET scan performed on 09/30/2023 revealed a hypermetabolic soft tissue mass at the site of her previous Whipple procedure and a hypermetabolic nodule in the left lung. The patient was not experiencing any new symptoms at the time of the scan. The lung nodule was not present on previous imaging, suggesting it is a new development. The patient is currently considering a biopsy to further investigate these findings.   The patient has been in regular contact with her oncologist, Dr. Mosetta Putt, who has been managing her pancreatic cancer. She has been undergoing regular lab work, with a recent phlebotomy performed at home. The patient is scheduled to review these results, along with the results of the proposed biopsy, with Dr. Mosetta Putt in the near future.   Pt was eager to proceed with bronchoscopy with biopsies. She underwent Flexible video fiberoptic bronchoscopy with robotic assistance and biopsies on 10/28/2023. She is here to review cytology results and to be evaluated for post procedure complications.  11/05/2023 Pt. Presents for follow up. She states she has done well after the procedure. She did cough and have a hoarse voice for 3 days. The cough was non productive. The cough has resolved over the last 3 days. She denies bleeding, fever, discolored secretions not dyspnea. No adverse reaction to the anesthesia.   We have discussed her cytology results. The left upper lobe nodule was positive  for adenocarcinoma . The morphology and immunophenotype are most consistent with  metastatic carcinoma including pancreatic carcinoma. This is what Dr. Mosetta Putt suspected. Patient has port placement scheduled for 12/18, and her first chemo is scheduled for 11/14/2023. We did discuss that radiation may be an option for treatment of the lung nodule in conjunction with the chemotherapy. Pt. Wanted to speak with Dr. Mosetta Putt before any referrals were made.  She has follow up scheduled.   Test Results: Cytology A. LUNG, LUL NODULE, FINE NEEDLE ASPIRATION AND BIOPSIES:  - Adenocarcinoma  - See comment   B. LUNG, LUL NODULE, BRUSHING:  - No malignant cells identified  - Benign bronchial cells   The morphology and immunophenotype are most consistent with  metastatic carcinoma including pancreatic carcinoma.   RADIOLOGY PET scan: Hypermetabolic soft tissue mass at previous Whipple surgical site; hypermetabolic nodule in left lung; no enlarged axillary, mediastinal, or hilar lymphadenopathy; irregular 1.3 cm anterior left upper lobe pulmonary nodule with low-level uptake; ill-defined 1.9 cm soft tissue mass at superior margin of Whipple procedure surgical site (09/30/2023) CT chest, abdomen, and pelvis: Nodule in left upper lobe (09/17/2023)     Latest Ref Rng & Units 10/31/2023    7:52 AM 10/07/2023    7:41 AM 09/18/2023    7:49 AM  CBC  WBC 4.0 - 10.5 K/uL 7.3  6.8  6.4   Hemoglobin 12.0 - 15.0 g/dL 59.5  63.8  75.6   Hematocrit 36.0 - 46.0 % 42.2  39.4  39.0   Platelets 150 - 400 K/uL 317  250  262        Latest Ref  Rng & Units 10/31/2023    7:52 AM 10/07/2023    7:41 AM 09/18/2023    7:49 AM  BMP  Glucose 70 - 99 mg/dL 409  811  97   BUN 8 - 23 mg/dL 15  16  14    Creatinine 0.44 - 1.00 mg/dL 9.14  7.82  9.56   Sodium 135 - 145 mmol/L 141  141  143   Potassium 3.5 - 5.1 mmol/L 4.5  3.9  3.8   Chloride 98 - 111 mmol/L 103  106  107   CO2 22 - 32 mmol/L 29  27  28    Calcium 8.9 - 10.3  mg/dL 9.3  8.8  8.9     BNP No results found for: "BNP"  ProBNP No results found for: "PROBNP"  PFT No results found for: "FEV1PRE", "FEV1POST", "FVCPRE", "FVCPOST", "TLC", "DLCOUNC", "PREFEV1FVCRT", "PSTFEV1FVCRT"  DG Chest Port 1 View Result Date: 10/28/2023 CLINICAL DATA:  Status post bronchoscopy and biopsy. EXAM: PORTABLE CHEST 1 VIEW COMPARISON:  Chest radiograph dated 09/08/2020 and CT dated 09/17/2023. FINDINGS: A biopsy clip noted associated with a small left find opacity in the left upper lobe. No pleural effusion or pneumothorax. The cardiac silhouette is within normal limits. No acute osseous pathology. IMPRESSION: 1. No pneumothorax. 2. Biopsy clip associated with a small left upper lobe opacity. Electronically Signed   By: Elgie Collard M.D.   On: 10/28/2023 17:44   DG C-ARM BRONCHOSCOPY Result Date: 10/28/2023 C-ARM BRONCHOSCOPY: Fluoroscopy was utilized by the requesting physician.  No radiographic interpretation.     Past medical hx Past Medical History:  Diagnosis Date   Anemia    Anxiety    Cancer (HCC) 07/2020   pancreatic cancer   Depression    Diabetes mellitus without complication (HCC) 07/2020   pancreatic cancer   High cholesterol    History of blood transfusion    History of hiatal hernia    small   Hypertension    Stroke [facility])    age 55, no residual effect     Social History   Tobacco Use   Smoking status: Never   Smokeless tobacco: Never  Vaping Use   Vaping status: Never Used  Substance Use Topics   Alcohol use: Never   Drug use: Never    Jacqueline Tyler reports that she has never smoked. She has never used smokeless tobacco. She reports that she does not drink alcohol and does not use drugs.  Tobacco Cessation: Never smoker   Past surgical hx, Family hx, Social hx all reviewed.  Current Outpatient Medications on File Prior to Visit  Medication Sig   acetaminophen (TYLENOL) 325 MG tablet Take 650 mg by mouth every 6 (six) hours as  needed for moderate pain (pain score 4-6).   busPIRone (BUSPAR) 5 MG tablet Take 5 mg by mouth daily.   citalopram (CELEXA) 40 MG tablet Take 40 mg by mouth every evening.   CREON 36000-114000 units CPEP capsule TAKE 2 CAPSULES BY MOUTH 3 TIMES A DAY WITH A MEAL. MAY ALSO TAKE 1 CAPSULE AS NEEDED WITH SNACKS   dexamethasone (DECADRON) 4 MG tablet Take 2 tablets (8 mg total) by mouth daily. Start the day after irinotecan chemotherapy for 2 days. Take with food.   hydrochlorothiazide (MICROZIDE) 12.5 MG capsule Take 12.5 mg by mouth in the morning.   lidocaine-prilocaine (EMLA) cream Apply to affected area once   magnesium oxide (MAG-OX) 400 (241.3 Mg) MG tablet Take 1 tablet (400 mg total) by mouth  every evening.   ondansetron (ZOFRAN) 8 MG tablet Take 1 tablet (8 mg total) by mouth every 8 (eight) hours as needed for nausea or vomiting. Start on the third day after irinotecan   potassium chloride (KLOR-CON) 10 MEQ tablet TAKE 1 TABLET BY MOUTH EVERY DAY (Patient taking differently: Take 10 mEq by mouth 2 (two) times daily.)   prochlorperazine (COMPAZINE) 10 MG tablet Take 1 tablet (10 mg total) by mouth every 6 (six) hours as needed for nausea or vomiting.   simvastatin (ZOCOR) 80 MG tablet Take 80 mg by mouth every evening.   No current facility-administered medications on file prior to visit.     Allergies  Allergen Reactions   Tyloxapol Other (See Comments)    Nausea  Tylox    Oxycodone-Acetaminophen Nausea Only    Patient was unsure / doesn't recall taking    Poison Ivy Extract Hives    Patient states HIGHLY allergice    Review Of Systems:  Constitutional:   No  weight loss, night sweats,  Fevers, chills, fatigue, or  lassitude.  HEENT:   No headaches,  Difficulty swallowing,  Tooth/dental problems, or  Sore throat,                No sneezing, itching, ear ache, nasal congestion, post nasal drip,   CV:  No chest pain,  Orthopnea, PND, swelling in lower extremities, anasarca,  dizziness, palpitations, syncope.   GI  No heartburn, indigestion, abdominal pain, nausea, vomiting, diarrhea, change in bowel habits, loss of appetite, bloody stools.   Resp: No shortness of breath with exertion or at rest.  No excess mucus, no productive cough,  No non-productive cough,  No coughing up of blood.  No change in color of mucus.  No wheezing.  No chest wall deformity  Skin: no rash or lesions.  GU: no dysuria, change in color of urine, no urgency or frequency.  No flank pain, no hematuria   MS:  No joint pain or swelling.  No decreased range of motion.  No back pain.  Psych:  No change in mood or affect. No depression or anxiety.  No memory loss.   Vital Signs BP 104/67 (BP Location: Left Arm, Cuff Size: Normal)   Pulse 75   Ht 5\' 1"  (1.549 m)   Wt 128 lb 12.8 oz (58.4 kg)   SpO2 98%   BMI 24.34 kg/m    Physical Exam:  General- No distress,  A&Ox3, pleasant ENT: No sinus tenderness, TM clear, pale nasal mucosa, no oral exudate,no post nasal drip, no LAN Cardiac: S1, S2, regular rate and rhythm, no murmur Chest: No wheeze/ rales/ dullness; no accessory muscle use, no nasal flaring, no sternal retractions Abd.: Soft Non-tender, ND, BS +, Body mass index is 24.34 kg/m.  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 This is what Dr. Mosetta Putt suspected. Plan is for chemotherapy. Port insertion is scheduled for 11/06/2023 The first infusion is scheduled for 11/14/2023.  You will also see Vincent Gros NP , and she will discuss treatment options with you. Radiation treatment to the lung may be  an option with  chemo, but we will let Dr. Mosetta Putt determine if this is needed.  Good luck with treatment. Please call if you have any questions.  Have a good holiday. Please contact office for sooner follow up if symptoms do not improve or worsen or seek emergency care    I spent 35 minutes dedicated to the care of this patient on the date of this encounter to include pre-visit review of records, face-to-face time with the patient discussing conditions above, post visit ordering of testing, clinical documentation with the electronic health record, making appropriate referrals as documented, and communicating necessary information to the patient's healthcare team.   Bevelyn Ngo, NP 11/05/2023  3:30 PM

## 2023-11-05 NOTE — Anesthesia Preprocedure Evaluation (Signed)
Anesthesia Evaluation  Patient identified by MRN, date of birth, ID band Patient awake    Reviewed: Allergy & Precautions, NPO status , Patient's Chart, lab work & pertinent test results  Airway Mallampati: II  TM Distance: >3 FB Neck ROM: Full    Dental no notable dental hx. (+) Teeth Intact, Dental Advisory Given   Pulmonary neg pulmonary ROS   Pulmonary exam normal breath sounds clear to auscultation       Cardiovascular hypertension, Normal cardiovascular exam Rhythm:Regular Rate:Normal     Neuro/Psych   Anxiety Depression    CVA    GI/Hepatic Neg liver ROS, hiatal hernia,GERD  ,,  Endo/Other  diabetes    Renal/GU      Musculoskeletal negative musculoskeletal ROS (+)    Abdominal   Peds  Hematology   Anesthesia Other Findings   Reproductive/Obstetrics                             Anesthesia Physical Anesthesia Plan  ASA: 2  Anesthesia Plan: General   Post-op Pain Management: Toradol IV (intra-op)*   Induction: Intravenous  PONV Risk Score and Plan: 4 or greater and TIVA, Treatment may vary due to age or medical condition, Midazolam and Ondansetron  Airway Management Planned: LMA  Additional Equipment: None  Intra-op Plan:   Post-operative Plan:   Informed Consent: I have reviewed the patients History and Physical, chart, labs and discussed the procedure including the risks, benefits and alternatives for the proposed anesthesia with the patient or authorized representative who has indicated his/her understanding and acceptance.     Dental advisory given  Plan Discussed with: CRNA and Anesthesiologist  Anesthesia Plan Comments:        Anesthesia Quick Evaluation

## 2023-11-05 NOTE — Progress Notes (Addendum)
COVID Vaccine Completed:  Yes  Date of COVID positive in last 90 days:  No  PCP - Hope Pigeon, DO Cardiologist - N/A Pulmonologist - Levy Pupa, MD  Chest x-ray - 10-28-23 Epic EKG - 10-28-23 Epic Stress Test - Yes, in the 1980s ECHO -  N/A Cardiac Cath -  N/A Pacemaker/ICD device last checked: Spinal Cord Stimulator: N/A  Bowel Prep -  N/A  Sleep Study -  N/A CPAP -   Patient states has elevated blood sugars due to pancreatic cancer Fasting Blood Sugar -  N/A Checks Blood Sugar - does not check   Last dose of GLP1 agonist-  N/A GLP1 instructions:  Hold 7 days before surgery    Last dose of SGLT-2 inhibitors-  N/A SGLT-2 instructions:  Hold 3 days before surgery   Blood Thinner Instructions:  N/A Aspirin Instructions: Last Dose:  Activity level:  Can go up a flight of stairs and perform activities of daily living without stopping and without symptoms of chest pain or shortness of breath.  Anesthesia review:  Hx of stroke, no deficits  Patient denies shortness of breath, fever, cough and chest pain at PAT appointment (completed over the phone)  Patient verbalized understanding of instructions that were given to them at the PAT appointment. Patient was also instructed that they will need to review over the PAT instructions again at home before surgery.

## 2023-11-05 NOTE — Patient Instructions (Addendum)
It is good to see you today. I am glad you have done well after the bronchoscopy. 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 This is what Dr. Mosetta Putt suspected. Plan is for chemotherapy. The first infusion is scheduled for 11/14/2023.  You will also see Vincent Gros NP , and she will discuss treatment options with you. Radiation treatment to the lung may be an option with chemo, but we will let Dr. Mosetta Putt determine if this is needed.  Good luck with treatment. Please call if you have any questions.  Have a good holiday. Please contact office for sooner follow up if symptoms do not improve or worsen or seek emergency care

## 2023-11-05 NOTE — Patient Instructions (Addendum)
SURGICAL WAITING ROOM VISITATION Patients having surgery or a procedure may have no more than 2 support people in the waiting area - these visitors may rotate.    Children under the age of 88 must have an adult with them who is not the patient.  If the patient needs to stay at the hospital during part of their recovery, the visitor guidelines for inpatient rooms apply. Pre-op nurse will coordinate an appropriate time for 1 support person to accompany patient in pre-op.  This support person may not rotate.    Please refer to the [facility] website for the visitor guidelines for Inpatients (after your surgery is over and you are in a regular room).       Your procedure is scheduled on: 11-06-23   Report to [facility] Main Entrance    Report to admitting at 8:15 AM   Call this number if you have problems the morning of surgery [phone]   Do not eat food :After Midnight.   After Midnight you may have the following liquids until 7:30 AM DAY OF SURGERY  Water Non-Citrus Juices (without pulp, NO RED-Apple, White grape, White cranberry) Black Coffee (NO MILK/CREAM OR CREAMERS, sugar ok)  Clear Tea (NO MILK/CREAM OR CREAMERS, sugar ok) regular and decaf                             Plain Jell-O (NO RED)                                           Fruit ices (not with fruit pulp, NO RED)                                     Popsicles (NO RED)                                                               Sports drinks like Gatorade (NO RED)                       If you have questions, please contact your surgeon's office.   FOLLOW ANY ADDITIONAL PRE OP INSTRUCTIONS YOU RECEIVED FROM YOUR SURGEON'S OFFICE!!!     Oral Hygiene is also important to reduce your risk of infection.                                    Remember - BRUSH YOUR TEETH THE MORNING OF SURGERY WITH YOUR REGULAR TOOTHPASTE   Do NOT smoke after Midnight   Take these medicines the morning of surgery with A SIP  OF WATER:   Buspirone  If needed Ondansetron, Compazine and Tylenol  Stop all vitamins and herbal supplements 7 days before surgery                              You may not have any metal on your body including hair pins,  jewelry, and body piercing             Do not wear make-up, lotions, powders, perfumes or deodorant  Do not wear nail polish including gel and S&S, artificial/acrylic nails, or any other type of covering on natural nails including finger and toenails. If you have artificial nails, gel coating, etc. that needs to be removed by a nail salon please have this removed prior to surgery or surgery may need to be canceled/ delayed if the surgeon/ anesthesia feels like they are unable to be safely monitored.   Do not shave  48 hours prior to surgery.    Do not bring valuables to the hospital. [facility] IS NOT RESPONSIBLE   FOR VALUABLES.   Contacts, dentures or bridgework may not be worn into surgery.  DO NOT BRING YOUR HOME MEDICATIONS TO THE HOSPITAL. PHARMACY WILL DISPENSE MEDICATIONS LISTED ON YOUR MEDICATION LIST TO YOU DURING YOUR ADMISSION IN THE HOSPITAL!    Patients discharged on the day of surgery will not be allowed to drive home.  Someone NEEDS to stay with you for the first 24 hours after anesthesia.              Please read over the following fact sheets you were given: IF YOU HAVE QUESTIONS ABOUT YOUR PRE-OP INSTRUCTIONS PLEASE CALL [phone] [facility]  If you received a COVID test during your pre-op visit  it is requested that you wear a mask when out in public, stay away from anyone that may not be feeling well and notify your surgeon if you develop symptoms. If you test positive for Covid or have been in contact with anyone that has tested positive in the last 10 days please notify you surgeon.  [place] - Preparing for Surgery Before surgery, you can play an important role.  Because skin is not sterile, your skin needs to be as free of germs as possible.   You can reduce the number of germs on your skin by washing with CHG (chlorahexidine gluconate) soap before surgery.  CHG is an antiseptic cleaner which kills germs and bonds with the skin to continue killing germs even after washing. Please DO NOT use if you have an allergy to CHG or antibacterial soaps.  If your skin becomes reddened/irritated stop using the CHG and inform your nurse when you arrive at Short Stay. Do not shave (including legs and underarms) for at least 48 hours prior to the first CHG shower.  You may shave your face/neck.  Please follow these instructions carefully:  1.  Shower with CHG Soap the night before surgery and the  morning of surgery.  2.  If you choose to wash your hair, wash your hair first as usual with your normal  shampoo.  3.  After you shampoo, rinse your hair and body thoroughly to remove the shampoo.                             4.  Use CHG as you would any other liquid soap.  You can apply chg directly to the skin and wash.  Gently with a scrungie or clean washcloth.  5.  Apply the CHG Soap to your body ONLY FROM THE NECK DOWN.   Do   not use on face/ open                           Wound  or open sores. Avoid contact with eyes, ears mouth and   genitals (private parts).                       Wash face,  Genitals (private parts) with your normal soap.             6.  Wash thoroughly, paying special attention to the area where your    surgery  will be performed.  7.  Thoroughly rinse your body with warm water from the neck down.  8.  DO NOT shower/wash with your normal soap after using and rinsing off the CHG Soap.                9.  Pat yourself dry with a clean towel.            10.  Wear clean pajamas.            11.  Place clean sheets on your bed the night of your first shower and do not  sleep with pets. Day of Surgery : Do not apply any lotions/deodorants the morning of surgery.  Please wear clean clothes to the hospital/surgery center.  FAILURE TO  FOLLOW THESE INSTRUCTIONS MAY RESULT IN THE CANCELLATION OF YOUR SURGERY  PATIENT SIGNATURE_________________________________  NURSE SIGNATURE__________________________________  ________________________________________________________________________

## 2023-11-06 ENCOUNTER — Ambulatory Visit (HOSPITAL_COMMUNITY): Payer: No Typology Code available for payment source

## 2023-11-06 ENCOUNTER — Ambulatory Visit (HOSPITAL_BASED_OUTPATIENT_CLINIC_OR_DEPARTMENT_OTHER): Payer: No Typology Code available for payment source | Admitting: Anesthesiology

## 2023-11-06 ENCOUNTER — Ambulatory Visit (HOSPITAL_COMMUNITY): Payer: No Typology Code available for payment source | Admitting: Anesthesiology

## 2023-11-06 ENCOUNTER — Other Ambulatory Visit: Payer: Self-pay

## 2023-11-06 ENCOUNTER — Telehealth: Payer: Self-pay

## 2023-11-06 ENCOUNTER — Encounter (HOSPITAL_COMMUNITY)
Admission: RE | Disposition: A | Discharge status: Home / Self Care | Payer: Self-pay | Source: Home / Self Care | Attending: Surgery

## 2023-11-06 ENCOUNTER — Ambulatory Visit (HOSPITAL_COMMUNITY)
Admission: RE | Admit: 2023-11-06 | Discharge: 2023-11-06 | Disposition: A | Discharge status: Home / Self Care | Payer: No Typology Code available for payment source | Attending: Surgery | Admitting: Surgery

## 2023-11-06 ENCOUNTER — Encounter (HOSPITAL_COMMUNITY): Payer: Self-pay | Admitting: Surgery

## 2023-11-06 DIAGNOSIS — C259 Malignant neoplasm of pancreas, unspecified: Secondary | ICD-10-CM | POA: Diagnosis present

## 2023-11-06 DIAGNOSIS — Z9221 Personal history of antineoplastic chemotherapy: Secondary | ICD-10-CM | POA: Diagnosis not present

## 2023-11-06 DIAGNOSIS — E119 Type 2 diabetes mellitus without complications: Secondary | ICD-10-CM

## 2023-11-06 HISTORY — PX: PORTACATH PLACEMENT: SHX2246

## 2023-11-06 LAB — GLUCOSE, CAPILLARY
Glucose-Capillary: 90 mg/dL (ref 70–99)
Glucose-Capillary: 108 mg/dL — ABNORMAL HIGH (ref 70–99)

## 2023-11-06 LAB — HEMOGLOBIN A1C
Hgb A1c MFr Bld: 5.6 % (ref 4.8–5.6)
Mean Plasma Glucose: 114.02 mg/dL

## 2023-11-06 SURGERY — INSERTION, TUNNELED CENTRAL VENOUS DEVICE, WITH PORT
Anesthesia: General

## 2023-11-06 MED ORDER — CHLORHEXIDINE GLUCONATE 0.12 % MT SOLN
15.0000 mL | Freq: Once | OROMUCOSAL | Status: AC
  Administered 2023-11-06: 15 mL via OROMUCOSAL

## 2023-11-06 MED ORDER — CHLORHEXIDINE GLUCONATE 0.12 % MT SOLN: 15.0000 mL | Freq: Once | OROMUCOSAL | Status: DC

## 2023-11-06 MED ORDER — OXYCODONE HCL 5 MG/5ML PO SOLN
5.0000 mg | Freq: Once | ORAL | Status: DC | PRN
Start: 2023-11-06 — End: 2023-11-06

## 2023-11-06 MED ORDER — LIDOCAINE HCL (PF) 2 % IJ SOLN
INTRAMUSCULAR | Status: AC
  Filled 2023-11-06: qty 5

## 2023-11-06 MED ORDER — HEPARIN 6000 UNIT IRRIGATION SOLUTION
Status: DC | PRN
  Administered 2023-11-06: 1

## 2023-11-06 MED ORDER — 0.9 % SODIUM CHLORIDE (POUR BTL) OPTIME: TOPICAL | Status: DC | PRN

## 2023-11-06 MED ORDER — AMISULPRIDE (ANTIEMETIC) 5 MG/2ML IV SOLN: 10.0000 mg | Freq: Once | INTRAVENOUS | Status: DC | PRN

## 2023-11-06 MED ORDER — MIDAZOLAM HCL 2 MG/2ML IJ SOLN
INTRAMUSCULAR | Status: AC
  Filled 2023-11-06: qty 2

## 2023-11-06 MED ORDER — PROPOFOL 10 MG/ML IV BOLUS
INTRAVENOUS | Status: DC | PRN
  Administered 2023-11-06: 160 mg via INTRAVENOUS

## 2023-11-06 MED ORDER — EPHEDRINE SULFATE-NACL 50-0.9 MG/10ML-% IV SOSY
PREFILLED_SYRINGE | INTRAVENOUS | Status: DC | PRN
  Administered 2023-11-06: 5 mg via INTRAVENOUS

## 2023-11-06 MED ORDER — ACETAMINOPHEN 10 MG/ML IV SOLN: 1000.0000 mg | Freq: Once | INTRAVENOUS | Status: DC | PRN

## 2023-11-06 MED ORDER — HEPARIN SOD (PORK) LOCK FLUSH 100 UNIT/ML IV SOLN
INTRAVENOUS | Status: AC
  Filled 2023-11-06: qty 5

## 2023-11-06 MED ORDER — ONDANSETRON HCL 4 MG/2ML IJ SOLN: 4.0000 mg | Freq: Once | INTRAMUSCULAR | Status: DC | PRN

## 2023-11-06 MED ORDER — PROPOFOL 500 MG/50ML IV EMUL
INTRAVENOUS | Status: DC | PRN
  Administered 2023-11-06: 125 ug/kg/min via INTRAVENOUS

## 2023-11-06 MED ORDER — HEPARIN SOD (PORK) LOCK FLUSH 100 UNIT/ML IV SOLN
INTRAVENOUS | Status: DC | PRN
  Administered 2023-11-06: 500 [IU] via INTRAVENOUS

## 2023-11-06 MED ORDER — DEXAMETHASONE SODIUM PHOSPHATE 10 MG/ML IJ SOLN
INTRAMUSCULAR | Status: DC | PRN
  Administered 2023-11-06: 10 mg via INTRAVENOUS

## 2023-11-06 MED ORDER — BUPIVACAINE-EPINEPHRINE 0.25% -1:200000 IJ SOLN
INTRAMUSCULAR | Status: AC
  Filled 2023-11-06: qty 1

## 2023-11-06 MED ORDER — OXYCODONE HCL 5 MG PO TABS: 5.0000 mg | ORAL_TABLET | Freq: Once | ORAL | Status: DC | PRN

## 2023-11-06 MED ORDER — ACETAMINOPHEN 500 MG PO TABS
1000.0000 mg | ORAL_TABLET | Freq: Once | ORAL | Status: AC
  Administered 2023-11-06: 1000 mg via ORAL
  Filled 2023-11-06: qty 2

## 2023-11-06 MED ORDER — LACTATED RINGERS IV SOLN: INTRAVENOUS | Status: DC

## 2023-11-06 MED ORDER — LIDOCAINE 2% (20 MG/ML) 5 ML SYRINGE
INTRAMUSCULAR | Status: DC | PRN
  Administered 2023-11-06: 80 mg via INTRAVENOUS

## 2023-11-06 MED ORDER — HEPARIN 6000 UNIT IRRIGATION SOLUTION
Freq: Once | Status: DC
  Filled 2023-11-06: qty 500

## 2023-11-06 MED ORDER — HYDROMORPHONE HCL 1 MG/ML IJ SOLN: 0.2500 mg | INTRAMUSCULAR | Status: DC | PRN

## 2023-11-06 MED ORDER — ORAL CARE MOUTH RINSE: 15.0000 mL | Freq: Once | OROMUCOSAL | Status: AC

## 2023-11-06 MED ORDER — PROPOFOL 1000 MG/100ML IV EMUL
INTRAVENOUS | Status: AC
  Filled 2023-11-06: qty 100

## 2023-11-06 MED ORDER — EPHEDRINE 5 MG/ML INJ
INTRAVENOUS | Status: AC
  Filled 2023-11-06: qty 5

## 2023-11-06 MED ORDER — ROCURONIUM BROMIDE 10 MG/ML (PF) SYRINGE
PREFILLED_SYRINGE | INTRAVENOUS | Status: AC
  Filled 2023-11-06: qty 10

## 2023-11-06 MED ORDER — PHENYLEPHRINE 80 MCG/ML (10ML) SYRINGE FOR IV PUSH (FOR BLOOD PRESSURE SUPPORT)
PREFILLED_SYRINGE | INTRAVENOUS | Status: DC | PRN
  Administered 2023-11-06: 80 ug via INTRAVENOUS
  Administered 2023-11-06: 160 ug via INTRAVENOUS

## 2023-11-06 MED ORDER — MIDAZOLAM HCL 2 MG/2ML IJ SOLN
INTRAMUSCULAR | Status: DC | PRN
  Administered 2023-11-06: .5 mg via INTRAVENOUS
  Administered 2023-11-06: 1 mg via INTRAVENOUS

## 2023-11-06 MED ORDER — CEFAZOLIN SODIUM-DEXTROSE 2-4 GM/100ML-% IV SOLN
2.0000 g | INTRAVENOUS | Status: AC
  Administered 2023-11-06: 2 g via INTRAVENOUS
  Filled 2023-11-06: qty 100

## 2023-11-06 MED ORDER — BUPIVACAINE-EPINEPHRINE 0.25% -1:200000 IJ SOLN
INTRAMUSCULAR | Status: DC | PRN
  Administered 2023-11-06: 17 mL

## 2023-11-06 MED ORDER — ORAL CARE MOUTH RINSE: 15.0000 mL | Freq: Once | OROMUCOSAL | Status: DC

## 2023-11-06 SURGICAL SUPPLY — 35 items
BAG DECANTER FOR FLEXI CONT (MISCELLANEOUS) ×1 IMPLANT
BENZOIN TINCTURE PRP APPL 2/3 (GAUZE/BANDAGES/DRESSINGS) IMPLANT
BLADE SURG 15 STRL LF DISP TIS (BLADE) ×1 IMPLANT
BLADE SURG SZ11 CARB STEEL (BLADE) ×1 IMPLANT
CHLORAPREP W/TINT 26 (MISCELLANEOUS) ×1 IMPLANT
CLSR STERI-STRIP ANTIMIC 1/2X4 (GAUZE/BANDAGES/DRESSINGS) IMPLANT
COVER SURGICAL LIGHT HANDLE (MISCELLANEOUS) ×1 IMPLANT
DERMABOND ADVANCED .7 DNX12 (GAUZE/BANDAGES/DRESSINGS) IMPLANT
DERMABOND ADVANCED .7 DNX6 (GAUZE/BANDAGES/DRESSINGS) IMPLANT
DRAPE C-ARM 42X120 X-RAY (DRAPES) ×1 IMPLANT
DRAPE LAPAROSCOPIC ABDOMINAL (DRAPES) ×1 IMPLANT
DRAPE UTILITY XL STRL (DRAPES) ×1 IMPLANT
ELECT REM PT RETURN 15FT ADLT (MISCELLANEOUS) ×1 IMPLANT
GAUZE 4X4 16PLY ~~LOC~~+RFID DBL (SPONGE) ×1 IMPLANT
GAUZE SPONGE 4X4 12PLY STRL (GAUZE/BANDAGES/DRESSINGS) ×1 IMPLANT
GLOVE BIOGEL PI IND STRL 6 (GLOVE) ×1 IMPLANT
GLOVE BIOGEL PI MICRO STRL 5.5 (GLOVE) ×1 IMPLANT
GOWN STRL REUS W/ TWL LRG LVL3 (GOWN DISPOSABLE) ×1 IMPLANT
KIT BASIN OR (CUSTOM PROCEDURE TRAY) ×1 IMPLANT
KIT PORT POWER 8FR ISP CVUE (Port) ×1 IMPLANT
KIT TURNOVER KIT A (KITS) IMPLANT
NDL HYPO 22X1.5 SAFETY MO (MISCELLANEOUS) ×1 IMPLANT
NEEDLE HYPO 22X1.5 SAFETY MO (MISCELLANEOUS) ×1
PACK BASIC VI WITH GOWN DISP (CUSTOM PROCEDURE TRAY) ×1 IMPLANT
PENCIL SMOKE EVACUATOR (MISCELLANEOUS) ×1 IMPLANT
SET PORT ACCESS POWERLOC (SET/KITS/TRAYS/PACK) IMPLANT
SPIKE FLUID TRANSFER (MISCELLANEOUS) ×1 IMPLANT
SUT MNCRL AB 4-0 PS2 18 (SUTURE) ×1 IMPLANT
SUT PROLENE 2 0 SH DA (SUTURE) ×2 IMPLANT
SUT VIC AB 2-0 SH 18 (SUTURE) IMPLANT
SUT VIC AB 2-0 SH 27X BRD (SUTURE) IMPLANT
SUT VIC AB 3-0 SH 27X BRD (SUTURE) ×1 IMPLANT
SYR 20ML ECCENTRIC (SYRINGE) ×1 IMPLANT
SYR 20ML LL LF (SYRINGE) ×1 IMPLANT
TOWEL OR 17X26 10 PK STRL BLUE (TOWEL DISPOSABLE) ×1 IMPLANT

## 2023-11-06 NOTE — Interval H&P Note (Signed)
History and Physical Interval Note:  11/06/2023 9:26 AM  Jacqueline Tyler  has presented today for surgery, with the diagnosis of PANCREATIC CANCER.  The various methods of treatment have been discussed with the patient and family. After consideration of risks, benefits and other options for treatment, the patient has consented to  Procedure(s) with comments: INSERTION PORT-A-CATH (N/A) - LMA as a surgical intervention.  The patient's history has been reviewed, patient examined, no change in status, stable for surgery.  I have reviewed the patient's chart and labs.  Questions were answered to the patient's satisfaction.     Fritzi Mandes

## 2023-11-06 NOTE — Anesthesia Postprocedure Evaluation (Signed)
Anesthesia Post Note  Patient: Jacqueline Tyler  Procedure(s) Performed: INSERTION PORT-A-CATH     Patient location during evaluation: PACU Anesthesia Type: General Level of consciousness: awake and alert Pain management: pain level controlled Vital Signs Assessment: post-procedure vital signs reviewed and stable Respiratory status: spontaneous breathing, nonlabored ventilation, respiratory function stable and patient connected to nasal cannula oxygen Cardiovascular status: blood pressure returned to baseline and stable Postop Assessment: no apparent nausea or vomiting Anesthetic complications: no   No notable events documented.  Last Vitals:  Vitals:   11/06/23 1200 11/06/23 1215  BP: (!) 107/58 103/89  Pulse: 63 70  Resp: 11 14  Temp:    SpO2: 100% 100%    Last Pain:  Vitals:   11/06/23 1215  TempSrc:   PainSc: 0-No pain                 Trevor Iha

## 2023-11-06 NOTE — Discharge Instructions (Signed)
SURGERY DISCHARGE INSTRUCTIONS: PORT-A-CATH PLACEMENT  Activity You may resume your usual activities as tolerated Ok to shower in 24 hours, but do not bathe or submerge incision underwater for 2 weeks. Do not drive while taking narcotic pain medication.  Wound Care Your incision is covered with skin glue called Dermabond. This will peel off on its own over time. You may shower and allow warm soapy water to run over your incisions. Gently pat dry. Do not submerge your incision underwater. Monitor your incision for any new redness, tenderness, or drainage. You may start using your port in 48 hours. Do not apply EMLA cream directly over the Dermabond (skin glue).  When to Call us: Fever greater than 100.5 New redness, drainage, or swelling at incision site Severe pain, nausea, or vomiting Shortness of breath, difficulty breathing  For questions or concerns, please call the [facility] Surgery office at [phone].

## 2023-11-06 NOTE — Telephone Encounter (Signed)
Spoke with pt regarding her GCSF injection per staff message received from Jorene Guest on 11/05/2023.  Pt stated previously (a few years ago) she had to get her GCSF w/in her home.  Pt wanted to know if this is still the case.  Asked pt if she still have the same insurance she had 3 years ago.  Pt stated "NO".  Stated based off what the pt's treatment plan stated w/in EPIC, the pt will get Nuelasta 6mg  injection here w/in [facility].  Stated the injections in EPIC is showing approved.  Pt was glad to hear the news and had no further questions at this time.

## 2023-11-06 NOTE — Transfer of Care (Signed)
Immediate Anesthesia Transfer of Care Note  Patient: Jacqueline Tyler  Procedure(s) Performed: INSERTION PORT-A-CATH  Patient Location: PACU  Anesthesia Type:General  Level of Consciousness: awake, alert , and oriented  Airway & Oxygen Therapy: Patient Spontanous Breathing and Patient connected to face mask oxygen  Post-op Assessment: Report given to RN and Post -op Vital signs reviewed and stable  Post vital signs: Reviewed and stable  Last Vitals:  Vitals Value Taken Time  BP    Temp    Pulse 78 11/06/23 1117  Resp 20 11/06/23 1117  SpO2 100 % 11/06/23 1117  Vitals shown include unfiled device data.  Last Pain:  Vitals:   11/06/23 0852  TempSrc:   PainSc: 0-No pain         Complications: No notable events documented.

## 2023-11-06 NOTE — Op Note (Signed)
Date: 11/06/23  Patient: Jacqueline Tyler MRN: [id number]  Preoperative Diagnosis: Recurrent pancreatic adenocarcinoma Postoperative Diagnosis: Same  Procedure: Port-a-cath insertion  Surgeon: Sophronia Simas, MD  EBL: Minimal  Anesthesia: General LMA  Specimens: None  Indications: Ms. Rusch is a 62 yo female with a history of pancreatic adenocarcinoma, treated with chemotherapy and a Whipple procedure in 2022. She now has evidence of recurrent disease, and was referred for portacath insertion to begin systemic chemotherapy. After a discussion of the risks and benefits, she agreed to proceed.  Findings: 8-Fr single-lumen power port place via the left subclavian vein under fluoroscopic guidance. Total catheter length of 18cm.  Procedure details: Informed consent was obtained in the preoperative area prior to the procedure. The patient was brought to the operating room and placed on the table in the supine position. General anesthesia was induced and appropriate lines and drains were placed for intraoperative monitoring. Perioperative antibiotics were administered per SCIP guidelines. The chest and neck were prepped and draped in the usual sterile fashion. A pre-procedure timeout was taken verifying patient identity, surgical site and procedure to be performed.  The patient was placed in Trendelenberg and the left subclavian vein was accessed with a large-bore needle. A guidewire was inserted and advanced, and position in the SVC was confirmed fluoroscopically. The needle was removed. Next a skin incision was made on the left upper chest wall, incorporating the wire exit site, and a subcutaneous pocket was created with cautery. The port and catheter were then flushed and brought onto the field. Three 2-0 prolene sutures were used to secure the port in the subcutaneous pocket, but the sutures were not tied down. The port was placed in the pocket and the attached cathether was measured using fluoro - it was  placed over the skin adjacent to the guidewire, and marked externally at the cavoatrial junction. The catheter was then cut at this location, which was at 18cm. The dilator and sheath were then advanced over the guidewire under fluoroscopic guidance, and the wire and dilator were removed. The end of the catheter was inserted through the sheath and advanced, and the sheath was peeled away. The port was then accessed with a Huber needle, and blood was aspirated and the port was flushed with heparinized saline. A final fluoroscopic image confirmed appropriate position of the catheter tip within the SVC, without kinking of the catheter. The prolene sutures were tied down. The deep dermis was closed with interrupted 3-0 Vicryl sutures. A final flush of 500 units heparin (100 units/mL) was given via the port. The skin was closed with a running subcuticular 4-0 monocryl suture. Dermabond was applied.  The patient tolerated the procedure well with no apparent complications. All counts were correct x2 at the end of the procedure. The patient was extubated and taken to PACU in stable condition.  Sophronia Simas, MD 11/06/23 11:10 AM

## 2023-11-06 NOTE — Anesthesia Procedure Notes (Signed)
Procedure Name: LMA Insertion Date/Time: 11/06/2023 10:27 AM  Performed by: [place], Clydene Pugh, CRNAPre-anesthesia Checklist: Patient identified, Emergency Drugs available, Suction available and Patient being monitored Patient Re-evaluated:Patient Re-evaluated prior to induction Oxygen Delivery Method: Circle system utilized Preoxygenation: Pre-oxygenation with 100% oxygen Induction Type: IV induction Ventilation: Mask ventilation without difficulty LMA: LMA inserted LMA Size: 4.0 Number of attempts: 1 Airway Equipment and Method: Bite block Placement Confirmation: positive ETCO2 Tube secured with: Tape Dental Injury: Teeth and Oropharynx as per pre-operative assessment

## 2023-11-07 ENCOUNTER — Encounter (HOSPITAL_COMMUNITY): Payer: Self-pay | Admitting: Surgery

## 2023-11-07 ENCOUNTER — Telehealth: Payer: Self-pay

## 2023-11-07 NOTE — Telephone Encounter (Addendum)
Called patient to relay message below as  per Dr. Mosetta Putt. Patient voiced full understanding.    ----- Message from Malachy Mood sent at 11/07/2023 10:10 AM EST ----- Please let pt know know her lung biopsy was consistent with pancreatic cancer metastasis based on IHC studies. Those results were not back when I saw her, thx   Malachy Mood

## 2023-11-10 ENCOUNTER — Emergency Department (HOSPITAL_BASED_OUTPATIENT_CLINIC_OR_DEPARTMENT_OTHER): Payer: No Typology Code available for payment source

## 2023-11-10 ENCOUNTER — Encounter (HOSPITAL_BASED_OUTPATIENT_CLINIC_OR_DEPARTMENT_OTHER): Payer: Self-pay

## 2023-11-10 ENCOUNTER — Emergency Department (HOSPITAL_BASED_OUTPATIENT_CLINIC_OR_DEPARTMENT_OTHER)
Admission: EM | Admit: 2023-11-10 | Discharge: 2023-11-10 | Disposition: A | Discharge status: Home / Self Care | Payer: No Typology Code available for payment source | Attending: Emergency Medicine | Admitting: Emergency Medicine

## 2023-11-10 ENCOUNTER — Other Ambulatory Visit: Payer: Self-pay

## 2023-11-10 DIAGNOSIS — M25562 Pain in left knee: Secondary | ICD-10-CM | POA: Diagnosis present

## 2023-11-10 DIAGNOSIS — I10 Essential (primary) hypertension: Secondary | ICD-10-CM | POA: Insufficient documentation

## 2023-11-10 DIAGNOSIS — X501XXA Overexertion from prolonged static or awkward postures, initial encounter: Secondary | ICD-10-CM | POA: Insufficient documentation

## 2023-11-10 DIAGNOSIS — E119 Type 2 diabetes mellitus without complications: Secondary | ICD-10-CM | POA: Diagnosis not present

## 2023-11-10 DIAGNOSIS — S83412A Sprain of medial collateral ligament of left knee, initial encounter: Secondary | ICD-10-CM | POA: Diagnosis not present

## 2023-11-10 MED ORDER — OXYCODONE-ACETAMINOPHEN 5-325 MG PO TABS
1.0000 | ORAL_TABLET | Freq: Once | ORAL | Status: AC
  Administered 2023-11-10: 1 via ORAL
  Filled 2023-11-10: qty 1

## 2023-11-10 MED ORDER — DICLOFENAC SODIUM 50 MG PO TBEC: 50.0000 mg | DELAYED_RELEASE_TABLET | Freq: Two times a day (BID) | ORAL | 0 refills | Status: AC

## 2023-11-10 MED ORDER — OXYCODONE-ACETAMINOPHEN 5-325 MG PO TABS: 1.0000 | ORAL_TABLET | Freq: Four times a day (QID) | ORAL | 0 refills | Status: DC | PRN

## 2023-11-10 MED ORDER — NAPROXEN 250 MG PO TABS
500.0000 mg | ORAL_TABLET | Freq: Once | ORAL | Status: AC
  Administered 2023-11-10: 500 mg via ORAL
  Filled 2023-11-10: qty 2

## 2023-11-10 NOTE — Discharge Instructions (Signed)
Thank you for allowing [place] to be a part of your care today.  You were evaluated in the ED for left knee pain.  Your x-ray was negative for fracture or dislocation.  I suspect that you have sustained a bad sprain to your knee.  You are provided with a hinged knee brace to use for support.  You are also provided with crutches to use for weightbearing purposes.  I encourage you to slowly begin weightbearing on your leg as tolerated.  Elevate your leg a few times a day and ice it for 20 minutes at a time.  Always use a barrier between your skin and the ice to avoid skin injury.  Do this for the next 24 to 48 hours.  After this time, transition to warm compresses or heat to the knee, 2-3 times per day, for 20 minutes at a time.  I have prescribed you a prescription of anti-inflammatory to take for pain and inflammation.  Please take as prescribed.  I also prescribed you a short course of pain medicine to take for severe or breakthrough pain.  Do not consume alcohol, drive, operate heavy machinery, or perform other tests that require to be completely alert and awake while taking this medication as it is often sedating.  Follow-up with orthopedics as needed.  I encourage you to follow-up with orthopedics especially if your knee does not begin improving with conservative treatment.  Return to the ED if you develop sudden worsening of your symptoms or if you have any new concerns.

## 2023-11-10 NOTE — ED Provider Notes (Signed)
[place] [facility] Provider Note   CSN: [id number] Arrival date & time: 11/10/23  1906     History  Chief Complaint  Patient presents with   Knee Injury    Jacqueline Tyler is a 62 y.o. female with past medical history significant for diabetes, hypertension, hyperlipidemia, cancer, stroke, depression, anemia presents to the ED complaining of left knee pain.  Patient reports that she was sitting on the ground and when she got up off of the floor her knee twisted and she felt a pop.  She is currently complaining of pain on the inside of the knee with some swelling.  No prior surgeries on this knee.  Denies any other injuries.         Home Medications Prior to Admission medications   Medication Sig Start Date End Date Taking? Authorizing Provider  diclofenac (VOLTAREN) 50 MG EC tablet Take 1 tablet (50 mg total) by mouth 2 (two) times daily for 10 days. 11/10/23 11/20/23 Yes Jossiah Smoak R, PA-C  oxyCODONE-acetaminophen (PERCOCET/ROXICET) 5-325 MG tablet Take 1 tablet by mouth every 6 (six) hours as needed for severe pain (pain score 7-10). 11/10/23  Yes Jermany Sundell R, PA-C  acetaminophen (TYLENOL) 325 MG tablet Take 650 mg by mouth every 6 (six) hours as needed for moderate pain (pain score 4-6).    [provider]  busPIRone (BUSPAR) 5 MG tablet Take 5 mg by mouth daily.    [provider]  citalopram (CELEXA) 40 MG tablet Take 40 mg by mouth every evening. 08/08/20   [provider]  CREON 36000-114000 units CPEP capsule TAKE 2 CAPSULES BY MOUTH 3 TIMES A DAY WITH A MEAL. MAY ALSO TAKE 1 CAPSULE AS NEEDED WITH SNACKS 09/20/23   Malachy Mood, MD  dexamethasone (DECADRON) 4 MG tablet Take 2 tablets (8 mg total) by mouth daily. Start the day after irinotecan chemotherapy for 2 days. Take with food. 10/31/23   Malachy Mood, MD  hydrochlorothiazide (MICROZIDE) 12.5 MG capsule Take 12.5 mg by mouth in the morning. 10/19/21   [provider]  lidocaine-prilocaine (EMLA) cream Apply to affected area once 10/31/23   Malachy Mood, MD  magnesium oxide (MAG-OX) 400 (241.3 Mg) MG tablet Take 1 tablet (400 mg total) by mouth every evening. 10/28/23   Leslye Peer, MD  ondansetron (ZOFRAN) 8 MG tablet Take 1 tablet (8 mg total) by mouth every 8 (eight) hours as needed for nausea or vomiting. Start on the third day after irinotecan 10/31/23   Malachy Mood, MD  potassium chloride (KLOR-CON) 10 MEQ tablet TAKE 1 TABLET BY MOUTH EVERY DAY Patient taking differently: Take 10 mEq by mouth 2 (two) times daily. 09/24/23   Pollyann Samples, NP  prochlorperazine (COMPAZINE) 10 MG tablet Take 1 tablet (10 mg total) by mouth every 6 (six) hours as needed for nausea or vomiting. 10/31/23   Malachy Mood, MD  simvastatin (ZOCOR) 80 MG tablet Take 80 mg by mouth every evening. 07/25/20   [provider]      Allergies    Tyloxapol, Oxycodone-acetaminophen, and Poison ivy extract    Review of Systems   Review of Systems  Musculoskeletal:  Positive for arthralgias (L knee pain) and joint swelling.    Physical Exam Updated Vital Signs BP 134/72 (BP Location: Right Arm)   Pulse 70   Temp 98.3 F (36.8 C) (Oral)   Resp 17   Wt 58.1 kg   SpO2 99%  BMI 25.00 kg/m  Physical Exam Vitals and nursing note reviewed.  Constitutional:      General: She is not in acute distress.    Appearance: Normal appearance. She is not ill-appearing or diaphoretic.  Cardiovascular:     Rate and Rhythm: Normal rate and regular rhythm.  Pulmonary:     Effort: Pulmonary effort is normal.  Musculoskeletal:     Left knee: Swelling (medial aspect) present. No effusion, erythema, ecchymosis, bony tenderness or crepitus. Decreased range of motion (cannot fully extend). Tenderness present over the medial joint line and MCL. Normal alignment. Normal pulse.  Skin:    General: Skin is warm and dry.     Capillary Refill: Capillary refill takes less than 2  seconds.  Neurological:     Mental Status: She is alert. Mental status is at baseline.  Psychiatric:        Mood and Affect: Mood normal.        Behavior: Behavior normal.     ED Results / Procedures / Treatments   Labs (all labs ordered are listed, but only abnormal results are displayed) Labs Reviewed - No data to display  EKG None  Radiology DG Knee Complete 4 Views Left Result Date: 11/10/2023 CLINICAL DATA:  Knee injury with pain. EXAM: LEFT KNEE - COMPLETE 4+ VIEW COMPARISON:  None Available. FINDINGS: No evidence of fracture, dislocation, or joint effusion. 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 Soft tissues are unremarkable. IMPRESSION: No acute osseous injury. Electronically Signed   By: Romona Curls M.D.   On: 11/10/2023 19:59    Procedures Procedures    Medications Ordered in ED Medications  naproxen (NAPROSYN) tablet 500 mg (500 mg Oral Given 11/10/23 2144)  oxyCODONE-acetaminophen (PERCOCET/ROXICET) 5-325 MG per tablet 1 tablet (1 tablet Oral Given 11/10/23 2144)    ED Course/ Medical Decision Making/ A&P                                 Medical Decision Making Amount and/or Complexity of Data Reviewed Radiology: ordered.  Risk Prescription drug management.   This patient presents to the ED with chief complaint(s) of left knee pain.  The complaint involves an extensive differential diagnosis and also carries with it a high risk of complications and morbidity.    The differential diagnosis includes knee sprain, fracture, or dislocation  Initial Assessment:   Exam significant for mild swelling appreciated along the medial aspect of the left knee.  There is tenderness in this area.  No appreciable laxity.  Patient does have difficulty with full extension of the knee.  No patellar tendon or patella tenderness.  No obvious gross deformities or patellar dislocation.  No tenderness along the lateral aspect of the knee.  Skin is warm and dry without  overlying skin changes.  Independent interpretation of imaging: Patient had left knee x-ray ordered in triage.  There is minimal amount of osteoarthritis, but no evidence of dislocation or fracture.  Treatment and Reassessment: Patient was fitted with a hinged knee brace and provided with crutches for weightbearing.  Patient was also given a dose of naproxen and oxycodone while in the ED.  Disposition:   Will send patient home on a prescription strength anti-inflammatory to help with what I suspect is a ligamentous injury to the left knee.  Patient also provided with orthopedic information for follow-up should this not improve with conservative measures and treatment.  Discussed abortive care measures for the  treatment of left knee pain including RICE.  Will also send patient home on a short course of pain medicine to take for severe or breakthrough pain.  The patient has been appropriately medically screened and/or stabilized in the ED. I have low suspicion for any other emergent medical condition which would require further screening, evaluation or treatment in the ED or require inpatient management. At time of discharge the patient is hemodynamically stable and in no acute distress. I have discussed work-up results and diagnosis with patient and answered all questions. Patient is agreeable with discharge plan. We discussed strict return precautions for returning to the emergency department and they verbalized understanding.           Final Clinical Impression(s) / ED Diagnoses Final diagnoses:  Sprain of medial collateral ligament of left knee, initial encounter    Rx / DC Orders ED Discharge Orders          Ordered    oxyCODONE-acetaminophen (PERCOCET/ROXICET) 5-325 MG tablet  Every 6 hours PRN        11/10/23 2145    diclofenac (VOLTAREN) 50 MG EC tablet  2 times daily        11/10/23 2145              Lenard Simmer, PA-C 11/10/23 2147    Alvira Monday,  MD 11/11/23 1232

## 2023-11-10 NOTE — ED Triage Notes (Signed)
Pt arrives with c/o left knee injury that happened today. Per pt, she was trying to get out of the floor and her knee twisted and her knee "popped". No deformity noted.

## 2023-11-13 NOTE — Progress Notes (Unsigned)
Patient Care Team: Leola Brazil, DO as PCP - General (Internal Medicine) Malachy Mood, MD as Consulting Physician (Oncology) Fritzi Mandes, MD as Consulting Physician (General Surgery)  Clinic Day:  11/14/2023  Referring physician: Malachy Mood, MD  ASSESSMENT & PLAN:   Assessment & Plan: Pancreatic cancer [facility]) stage IIB, [name], [name] -Diagnosed 08/24/20 on EUS with Dr Dulce Sellar, which showed mass at head of pancreas, s/p stenting, with SMV and PV invasion, biopsy confirmed adenocarcinoma with few malignant-appearing LNs in peripancreatic and porta hepatis region. Staging was negative for metastatic disease.  -she completed neoadjuvant chemo FOLFIRINOX q2 weeks 09/12/20 - 12/29/20. -s/p whipple surgery by Dr Flonnie Hailstone on 02/08/21, path showed no residual disease.  -due to the high recurrence risk of pancreatic cancer, she is under close surveillance.  -Her PET scan from September 30, 2023 showed hypermetabolic soft tissue mass at the previous Whipple surgical site, and a small mildly hypermetabolic nodule in the left lung, concerning for metastatic recurrence. -lung biopsy 10/28/2023 confirmed adenocarcinoma, IHC pending to conform the origin  -11/05/2023-biopsy consistent with primary pancreatic adenocarcinoma.  Port placed 11/06/2023. -11/14/2023 -cycle 1 day 1 NARILIFOX chemotherapy.   Plan: Labs reviewed  -CBC showing WBC 8.4; Hgb 11.6; Hct 35.3; Plt 257; Anc 5.3 -CMP - K 3.5; glucose 107; BUN 16; Creatinine 0.82; eGFR > 60; Ca 8.7; LFTs normal.   Labs and patient condition are adequate for treatment. Proceed with cycle 1 day 1 NARILIFOX Labs/flush, follow-up, and treatment in 2 weeks.  Appointment scheduled for 11/28/2023.  The patient understands the plans discussed today and is in agreement with them.  She knows to contact our office if she develops concerns prior to her next appointment.  I provided 25 minutes of face-to-face time during this encounter and > 50% was spent counseling  as documented under my assessment and plan.    Carlean Jews, NP   CANCER CENTER [facility] - A DEPT OF [facility] [address] [place] [address] Dept: [phone] Dept Fax: [phone]   No orders of the defined types were placed in this encounter.     CHIEF COMPLAINT:  CC: Follow-up of pancreatic cancer  Current Treatment:  NARILIFOX every 2 weeks  INTERVAL HISTORY:  Venis is here today for repeat clinical assessment.  Last visit was 2024.  On 10/31/2023.  Reviewed lung biopsy results which did confirm adenocarcinoma of the lung.  This is metastasis from previous pancreatic adenocarcinoma.  She had port Placed on 11/06/2023. First chemotherapy treatment with NARILIFOX on  11/14/2023.  She denies chest pain, chest pressure, or shortness of breath. She denies headaches or visual disturbances. She denies abdominal pain, nausea, vomiting, or changes in bowel or bladder habits.   She denies fevers or chills. She denies pain. Her appetite is good. Her weight has increased 3 pounds over last week .  I have reviewed the past medical history, past surgical history, social history and family history with the patient and they are unchanged from previous note.  ALLERGIES:  is allergic to tyloxapol, oxycodone-acetaminophen, and poison ivy extract.  MEDICATIONS:  Current Outpatient Medications  Medication Sig Dispense Refill   acetaminophen (TYLENOL) 325 MG tablet Take 650 mg by mouth every 6 (six) hours as needed for moderate pain (pain score 4-6).     busPIRone (BUSPAR) 5 MG tablet Take 5 mg by mouth daily.     citalopram (CELEXA) 40 MG tablet Take 40 mg by mouth every  evening.     CREON 36000-114000 units CPEP capsule TAKE 2 CAPSULES BY MOUTH 3 TIMES A DAY WITH A MEAL. MAY ALSO TAKE 1 CAPSULE AS NEEDED WITH SNACKS 240 capsule 2   dexamethasone (DECADRON) 4 MG tablet Take 2 tablets (8 mg total) by mouth daily. Start the day after  irinotecan chemotherapy for 2 days. Take with food. 8 tablet 5   diclofenac (VOLTAREN) 50 MG EC tablet Take 1 tablet (50 mg total) by mouth 2 (two) times daily for 10 days. 20 tablet 0   hydrochlorothiazide (MICROZIDE) 12.5 MG capsule Take 12.5 mg by mouth in the morning.     lidocaine-prilocaine (EMLA) cream Apply to affected area once 30 g 3   magnesium oxide (MAG-OX) 400 (241.3 Mg) MG tablet Take 1 tablet (400 mg total) by mouth every evening.     ondansetron (ZOFRAN) 8 MG tablet Take 1 tablet (8 mg total) by mouth every 8 (eight) hours as needed for nausea or vomiting. Start on the third day after irinotecan 30 tablet 1   oxyCODONE-acetaminophen (PERCOCET/ROXICET) 5-325 MG tablet Take 1 tablet by mouth every 6 (six) hours as needed for severe pain (pain score 7-10). 12 tablet 0   potassium chloride (KLOR-CON) 10 MEQ tablet TAKE 1 TABLET BY MOUTH EVERY DAY (Patient taking differently: Take 10 mEq by mouth 2 (two) times daily.) 90 tablet 1   prochlorperazine (COMPAZINE) 10 MG tablet Take 1 tablet (10 mg total) by mouth every 6 (six) hours as needed for nausea or vomiting. 30 tablet 1   simvastatin (ZOCOR) 80 MG tablet Take 80 mg by mouth every evening.     No current facility-administered medications for this visit.    HISTORY OF PRESENT ILLNESS:   Oncology History Overview Note  Cancer Staging Pancreatic cancer [facility]) Staging form: Exocrine Pancreas, AJCC 8th Edition - Clinical stage from 08/24/2020: Stage IIB (cT2, cN1, cM0) - Signed by Malachy Mood, MD on 08/30/2020 Stage prefix: Initial diagnosis    Pancreatic cancer (HCC)  08/20/2020 Imaging   CT AP 08/20/20  IMPRESSION: 1. 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 Collateral circulation is noted. There is moderate intrahepatic and extrahepatic biliary  dilatation which appears to be due to the pancreatic head mass. Pancreatic ductal dilatation is noted as well. 2. Probable 2.7 cm uterine fibroid. 3. Aortic atherosclerosis.   Aortic Atherosclerosis (ICD10-I70.0).   08/20/2020 Tumor Marker   Ca19-9 - 927   08/22/2020 Procedure   ERCP by Dr Ewing Schlein 08/22/20  IMPRESSION - The major papilla appeared normal. - A biliary sphincterotomy was performed. - Cells for cytology obtained in the lower third and middle of the main duct. - One plastic stent was placed into the common bile duct.   FINAL MICROSCOPIC DIAGNOSIS:  - No malignant cells identified  - Benign reactive/reparative changes   08/24/2020 Cancer Staging   Staging form: Exocrine Pancreas, AJCC 8th Edition - Clinical stage from 08/24/2020: Stage IIB (cT2, cN1, cM0) - Signed by Malachy Mood, MD on 08/30/2020   08/24/2020 Procedure   EUS by Dr Dulce Sellar 08/24/20  IMPRESSION - There was no sign of significant pathology in the ampulla. - A few malignant-appearing lymph nodes were visualized in the peripancreatic region and porta hepatis region. - One stent was visualized endosonographically in the common bile duct. - A  mass was identified in the pancreatic head. Tissue was obtained from this exam. The preliminary diagnosis is consistent with adenocarcinoma. Invasion into SMV/PV seen. Lymphadenopathy noted. This was staged T3 N1 Mx by endosonographic criteria. Fine needle aspiration performed.   08/24/2020 Initial Biopsy   FINAL MICROSCOPIC DIAGNOSIS: 08/24/20 - Malignant cells consistent with adenocarcinoma    08/30/2020 Initial Diagnosis   Pancreatic cancer (HCC)   09/05/2020 Imaging   CT Chest  IMPRESSION: 1. Stable 2.7 cm infiltrating pancreatic head mass with borderline enlarged peripancreatic lymph nodes. 2. No findings for pulmonary metastatic disease. 3. Small hiatal hernia. 4. Aortic atherosclerosis.   Aortic Atherosclerosis (ICD10-I70.0).   09/08/2020 Procedure    INSERTION PORT-A-CATH by Dr Freida Busman and Donell Beers    09/12/2020 - 12/29/2020 Chemotherapy   Neoadjuvant FOLFIRINOX q2 weeks starting 09/12/20-12/29/20    12/02/2020 Imaging   CT CAP  IMPRESSION: 1. Previously noted mass of the central pancreatic head is almost entirely resolved, difficult to discretely appreciate on current examination. Findings are consistent with treatment response. There remains obstruction of the pancreatic duct near the head neck junction with mild prominence of the pancreatic duct, measuring up to 5 mm, with atrophy of the distal pancreatic parenchyma. 2. The portal vein, splenic vein, and superior mesenteric vein are now widely patent, previously effaced at the confluence by mass effect. 3. Interval placement of common bile duct stent, tip positioned in the distal duodenum, with relief of previously seen biliary ductal dilatation. 4. 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 Following the bifurcation of the celiac mesenteric trunk, the celiac axis appears to directly traverse the vicinity of the mass, although there does appear to be a fat plane about the vessel. 5. The distal small bowel and colon are diffusely somewhat hyperenhancing and inflamed appearing with vascular combing and a tethered appearance of the distal small bowel. This appearance generally suggests inflammatory bowel disease such as Crohn's disease. Correlate with referable clinical history, if present. No evidence of obstruction or other acute complication. 6. Hepatic steatosis. 7. Aortic atherosclerosis.   12/28/2020 Genetic Testing   Negative hereditary cancer genetic testing: no pathogenic variants detected in [employer] Common Hereditary Cancers Panel.  The report date is December 28, 2020.    The Common Hereditary Cancers Panel  offered by [facility] includes sequencing and/or deletion duplication testing of the following 47 genes: APC, ATM, AXIN2, BARD1, BMPR1A, BRCA1, BRCA2, BRIP1, CDH1, CDK4, CDKN2A (p14ARF), CDKN2A (p16INK4a), CHEK2, CTNNA1, DICER1, EPCAM (Deletion/duplication testing only), GREM1 (promoter region deletion/duplication testing only), GREM1, HOXB13, KIT, MEN1, MLH1, MSH2, MSH3, MSH6, MUTYH, NBN, NF1, NHTL1, PALB2, PDGFRA, PMS2, POLD1, POLE, PTEN, RAD50, RAD51C, RAD51D, SDHA, SDHB, SDHC, SDHD, SMAD4, SMARCA4. STK11, TP53, TSC1, TSC2, and VHL.  The following genes were evaluated for sequence changes only: SDHA and HOXB13 c.251G>A variant only.   01/17/2021 Imaging   CT CAP from [facility] IMPRESSION Small lesion in the pancreatic head measuring approximately 1.1 x 0.8 cm without evidence of vascular involvement or metastasis consistent with previously described, biopsy-proven pancreatic adenocarcinoma.   02/08/2021 Surgery   Whipple Surgery with Dr Deveron Furlong  Final Pathologic Diagnosis      A.  GALLBLADDER, CHOLECYSTECTOMY: Chronic cholecystitis. No malignancy identified.   B.  BILE DUCT STENT, REMOVAL (GROSS ONLY DIAGNOSIS): Stent, see gross description.   C.  WHIPPLE RESECTION: No residual malignancy identified. Chronic and acute inflammation with granulation  tissue reaction, fibrosis and features of chronic pancreatitis, suggestive of therapy effect. Fourteen lymph nodes, negative for metastasis (0/14). Margins negative for malignancy.        06/07/2021 Imaging   CT AP  IMPRESSION: 1. No current findings of recurrent malignancy. Interval Whipple procedure with expected postoperative findings. 2. A 3.5 cm stent is present in the dorsal pancreatic duct. No duct dilatation. There is an approximately 1.5 mm lucency centrally along this stent shown on image 43 series 7, possibilities include stent fracture, two separate stents, or an intentional radial lucency in this type of stent. 3. Small type 1  hiatal hernia. Mild distal esophageal wall thickening may reflect low-grade esophagitis. 4.  Aortic Atherosclerosis (ICD10-I70.0). 5.  Prominent stool throughout the colon favors constipation. 6. Uterine fibroid. 7. 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   11/14/2023 -  Chemotherapy   Patient is on Treatment Plan : PANCREAS NALIRIFOX D1, 15 Q28D     Port-A-Cath in place      REVIEW OF SYSTEMS:   Constitutional: Denies fevers, chills or abnormal weight loss Eyes: Denies blurriness of vision Ears, nose, mouth, throat, and face: Denies mucositis or sore throat Respiratory: Denies cough, dyspnea or wheezes Cardiovascular: Denies palpitation, chest discomfort or lower extremity swelling Gastrointestinal:  Denies nausea, heartburn or change in bowel habits Skin: Denies abnormal skin rashes Lymphatics: Denies new lymphadenopathy or easy bruising Neurological:Denies numbness, tingling or new weaknesses Behavioral/Psych: Mood is stable, no new changes  All other systems were reviewed with the patient and are negative.   VITALS:   Today's Vitals   11/14/23 0924 11/14/23 0927  BP: 119/62   Pulse: 69   Resp: 15   Temp: (!) 97.5 F (36.4 C)   TempSrc: Temporal   SpO2: 99%   Weight: 131 lb 9.6 oz (59.7 kg)   PainSc:  0-No pain   Body mass index is 25.7 kg/m.    Wt Readings from Last 3 Encounters:  11/14/23 131 lb 9.6 oz (59.7 kg)  11/10/23 128 lb (58.1 kg)  11/06/23 128 lb 12 oz (58.4 kg)    Body mass index is 25.7 kg/m.  Performance status (ECOG): 1 - Symptomatic but completely ambulatory  PHYSICAL EXAM:   GENERAL:alert, no distress and comfortable SKIN: skin color, texture, turgor are normal, no rashes or significant lesions EYES: normal, Conjunctiva are pink and non-injected, sclera clear OROPHARYNX:no exudate, no erythema and lips, buccal mucosa, and tongue normal  NECK: supple, thyroid normal size, non-tender,  without nodularity LYMPH:  no palpable lymphadenopathy in the cervical, axillary or inguinal LUNGS: clear to auscultation and percussion with normal breathing effort HEART: regular rate & rhythm and no murmurs and no lower extremity edema ABDOMEN:abdomen soft, non-tender and normal bowel sounds Musculoskeletal:no cyanosis of digits and no clubbing  NEURO: alert & oriented x 3 with fluent speech, no focal motor/sensory deficits  LABORATORY DATA:  I have reviewed the data as listed    Component Value Date/Time   NA 139 11/14/2023 0858   K 3.5 11/14/2023 0858   CL 106 11/14/2023 0858   CO2 26 11/14/2023 0858   GLUCOSE 107 (H) 11/14/2023 0858   BUN 16 11/14/2023 0858   CREATININE 0.82 11/14/2023 0858   CALCIUM 8.7 (L) 11/14/2023 0858   PROT 6.6 11/14/2023 0858   ALBUMIN 3.8 11/14/2023 0858   AST 20 11/14/2023 0858   ALT 27 11/14/2023 0858   ALKPHOS 77 11/14/2023 0858   BILITOT 0.9 11/14/2023 9604  GFRNONAA >60 11/14/2023 0858   GFRAA >60 08/23/2020 0606   Lab Results  Component Value Date   WBC 8.4 11/14/2023   NEUTROABS 5.5 11/14/2023   HGB 11.6 (L) 11/14/2023   HCT 35.3 (L) 11/14/2023   MCV 85.9 11/14/2023   PLT 257 11/14/2023      RADIOGRAPHIC STUDIES: DG Knee Complete 4 Views Left Result Date: 11/10/2023 CLINICAL DATA:  Knee injury with pain. EXAM: LEFT KNEE - COMPLETE 4+ VIEW COMPARISON:  None Available. FINDINGS: No evidence of fracture, dislocation, or joint effusion. 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 Soft tissues are unremarkable. IMPRESSION: No acute osseous injury. Electronically Signed   By: Romona Curls M.D.   On: 11/10/2023 19:59   DG CHEST PORT 1 VIEW Result Date: 11/06/2023 CLINICAL DATA:  Port-A-Cath placement. EXAM: PORTABLE CHEST 1 VIEW COMPARISON:  10/28/2023 FINDINGS: The lungs are clear without focal pneumonia, edema, pneumothorax or pleural effusion. Localization clip again noted suprahilar. Left upper lobe pulmonary lesion seen on  the previous study is less well demonstrated today. Left-sided Port-A-Cath is new in the interval with tip overlying the distal SVC level. No acute bony abnormality. Telemetry leads overlie the chest. IMPRESSION: Left-sided Port-A-Cath tip overlies the distal SVC level. No pneumothorax. Electronically Signed   By: Kennith Center M.D.   On: 11/06/2023 13:24   DG C-Arm 1-60 Min-No Report Result Date: 11/06/2023 Fluoroscopy was utilized by the requesting physician.  No radiographic interpretation.   DG Chest Port 1 View Result Date: 10/28/2023 CLINICAL DATA:  Status post bronchoscopy and biopsy. EXAM: PORTABLE CHEST 1 VIEW COMPARISON:  Chest radiograph dated 09/08/2020 and CT dated 09/17/2023. FINDINGS: A biopsy clip noted associated with a small left find opacity in the left upper lobe. No pleural effusion or pneumothorax. The cardiac silhouette is within normal limits. No acute osseous pathology. IMPRESSION: 1. No pneumothorax. 2. Biopsy clip associated with a small left upper lobe opacity. Electronically Signed   By: Elgie Collard M.D.   On: 10/28/2023 17:44   DG C-ARM BRONCHOSCOPY Result Date: 10/28/2023 C-ARM BRONCHOSCOPY: Fluoroscopy was utilized by the requesting physician.  No radiographic interpretation.

## 2023-11-13 NOTE — Assessment & Plan Note (Signed)
stage IIB, [name], [name] -Diagnosed 08/24/20 on EUS with Dr Dulce Sellar, which showed mass at head of pancreas, s/p stenting, with SMV and PV invasion, biopsy confirmed adenocarcinoma with few malignant-appearing LNs in peripancreatic and porta hepatis region. Staging was negative for metastatic disease.  -she completed neoadjuvant chemo FOLFIRINOX q2 weeks 09/12/20 - 12/29/20. -s/p whipple surgery by Dr Flonnie Hailstone on 02/08/21, path showed no residual disease.  -due to the high recurrence risk of pancreatic cancer, she is under close surveillance.  -Her PET scan from September 30, 2023 showed hypermetabolic soft tissue mass at the previous Whipple surgical site, and a small mildly hypermetabolic nodule in the left lung, concerning for metastatic recurrence. -lung biopsy 10/28/2023 confirmed adenocarcinoma, IHC pending to conform the origin  -11/05/2023-biopsy consistent with primary pancreatic adenocarcinoma.  Port placed 11/06/2023. -11/14/2023 -cycle 1 day 1 NARILIFOX chemotherapy.

## 2023-11-14 ENCOUNTER — Inpatient Hospital Stay: Payer: No Typology Code available for payment source

## 2023-11-14 ENCOUNTER — Telehealth: Payer: Self-pay

## 2023-11-14 ENCOUNTER — Inpatient Hospital Stay (HOSPITAL_BASED_OUTPATIENT_CLINIC_OR_DEPARTMENT_OTHER): Payer: No Typology Code available for payment source | Admitting: Nurse Practitioner

## 2023-11-14 ENCOUNTER — Encounter: Payer: Self-pay | Admitting: Nurse Practitioner

## 2023-11-14 VITALS — BP 119/62 | HR 69 | Temp 97.5°F | Resp 15 | Wt 131.6 lb

## 2023-11-14 DIAGNOSIS — C250 Malignant neoplasm of head of pancreas: Secondary | ICD-10-CM

## 2023-11-14 DIAGNOSIS — Z5111 Encounter for antineoplastic chemotherapy: Secondary | ICD-10-CM | POA: Diagnosis not present

## 2023-11-14 LAB — CBC WITH DIFFERENTIAL (CANCER CENTER ONLY)
Abs Immature Granulocytes: 0.03 10*3/uL (ref 0.00–0.07)
Basophils Absolute: 0 10*3/uL (ref 0.0–0.1)
Basophils Relative: 0 %
Eosinophils Absolute: 0.3 10*3/uL (ref 0.0–0.5)
Eosinophils Relative: 3 %
HCT: 35.3 % — ABNORMAL LOW (ref 36.0–46.0)
Hemoglobin: 11.6 g/dL — ABNORMAL LOW (ref 12.0–15.0)
Immature Granulocytes: 0 %
Lymphocytes Relative: 19 %
Lymphs Abs: 1.6 10*3/uL (ref 0.7–4.0)
MCH: 28.2 pg (ref 26.0–34.0)
MCHC: 32.9 g/dL (ref 30.0–36.0)
MCV: 85.9 fL (ref 80.0–100.0)
Monocytes Absolute: 1 10*3/uL (ref 0.1–1.0)
Monocytes Relative: 12 %
Neutro Abs: 5.5 10*3/uL (ref 1.7–7.7)
Neutrophils Relative %: 66 %
Platelet Count: 257 10*3/uL (ref 150–400)
RBC: 4.11 MIL/uL (ref 3.87–5.11)
RDW: 13.2 % (ref 11.5–15.5)
WBC Count: 8.4 10*3/uL (ref 4.0–10.5)
nRBC: 0 % (ref 0.0–0.2)

## 2023-11-14 LAB — CMP (CANCER CENTER ONLY)
ALT: 27 U/L (ref 0–44)
AST: 20 U/L (ref 15–41)
Albumin: 3.8 g/dL (ref 3.5–5.0)
Alkaline Phosphatase: 77 U/L (ref 38–126)
Anion gap: 7 (ref 5–15)
BUN: 16 mg/dL (ref 8–23)
CO2: 26 mmol/L (ref 22–32)
Calcium: 8.7 mg/dL — ABNORMAL LOW (ref 8.9–10.3)
Chloride: 106 mmol/L (ref 98–111)
Creatinine: 0.82 mg/dL (ref 0.44–1.00)
GFR, Estimated: >60 mL/min (ref >60)
Glucose, Bld: 107 mg/dL — ABNORMAL HIGH (ref 70–99)
Potassium: 3.5 mmol/L (ref 3.5–5.1)
Sodium: 139 mmol/L (ref 135–145)
Total Bilirubin: 0.9 mg/dL (ref <1.2)
Total Protein: 6.6 g/dL (ref 6.5–8.1)

## 2023-11-14 MED ORDER — DEXTROSE 5 % IV SOLN
400.0000 mg/m2 | Freq: Once | INTRAVENOUS | Status: AC
  Administered 2023-11-14: 648 mg via INTRAVENOUS
  Filled 2023-11-14: qty 32.4

## 2023-11-14 MED ORDER — SODIUM CHLORIDE 0.9 % IV SOLN
50.0000 mg/m2 | Freq: Once | INTRAVENOUS | Status: AC
  Administered 2023-11-14: 81.7 mg via INTRAVENOUS
  Filled 2023-11-14: qty 19

## 2023-11-14 MED ORDER — DEXTROSE 5 % IV SOLN
INTRAVENOUS | Status: DC
Start: 2023-11-14 — End: 2023-11-14

## 2023-11-14 MED ORDER — PALONOSETRON HCL INJECTION 0.25 MG/5ML
0.2500 mg | Freq: Once | INTRAVENOUS | Status: AC
  Administered 2023-11-14: 0.25 mg via INTRAVENOUS
  Filled 2023-11-14: qty 5

## 2023-11-14 MED ORDER — ATROPINE SULFATE 1 MG/ML IV SOLN
0.5000 mg | Freq: Once | INTRAVENOUS | Status: AC | PRN
  Administered 2023-11-14: 0.5 mg via INTRAVENOUS
  Filled 2023-11-14: qty 1

## 2023-11-14 MED ORDER — SODIUM CHLORIDE 0.9 % IV SOLN
2400.0000 mg/m2 | INTRAVENOUS | Status: DC
  Administered 2023-11-14: 3900 mg via INTRAVENOUS
  Filled 2023-11-14: qty 78

## 2023-11-14 MED ORDER — DEXAMETHASONE SODIUM PHOSPHATE 10 MG/ML IJ SOLN
10.0000 mg | Freq: Once | INTRAMUSCULAR | Status: AC
Start: 2023-11-14 — End: 2023-11-14
  Administered 2023-11-14: 10 mg via INTRAVENOUS
  Filled 2023-11-14: qty 1

## 2023-11-14 MED ORDER — DEXTROSE 5 % IV SOLN
60.0000 mg/m2 | Freq: Once | INTRAVENOUS | Status: AC
  Administered 2023-11-14: 100 mg via INTRAVENOUS
  Filled 2023-11-14: qty 20

## 2023-11-14 NOTE — Patient Instructions (Signed)
[facility] - A DEPT OF [facility]  Discharge Instructions: Thank you for choosing [facility] to provide your oncology and hematology care.   If you have a lab appointment with the Cancer Center, please go directly to the Cancer Center and check in at the registration area.   Wear comfortable clothing and clothing appropriate for easy access to any Portacath or PICC line.   We strive to give you quality time with your provider. You may need to reschedule your appointment if you arrive late (15 or more minutes).  Arriving late affects you and other patients whose appointments are after yours.  Also, if you miss three or more appointments without notifying the office, you may be dismissed from the clinic at the provider's discretion.      For prescription refill requests, have your pharmacy contact our office and allow 72 hours for refills to be completed.    Today you received the following chemotherapy and/or immunotherapy agents irinotecan liposome, leucovorin, fluorourcil, oxaliplatin      To help prevent nausea and vomiting after your treatment, we encourage you to take your nausea medication as directed.  BELOW ARE SYMPTOMS THAT SHOULD BE REPORTED IMMEDIATELY: *FEVER GREATER THAN 100.4 F (38 C) OR HIGHER *CHILLS OR SWEATING *NAUSEA AND VOMITING THAT IS NOT CONTROLLED WITH YOUR NAUSEA MEDICATION *UNUSUAL SHORTNESS OF BREATH *UNUSUAL BRUISING OR BLEEDING *URINARY PROBLEMS (pain or burning when urinating, or frequent urination) *BOWEL PROBLEMS (unusual diarrhea, constipation, pain near the anus) TENDERNESS IN MOUTH AND THROAT WITH OR WITHOUT PRESENCE OF ULCERS (sore throat, sores in mouth, or a toothache) UNUSUAL RASH, SWELLING OR PAIN  UNUSUAL VAGINAL DISCHARGE OR ITCHING   Items with * indicate a potential emergency and should be followed up as soon as possible or go to the Emergency Department if any problems should occur.  Please  show the CHEMOTHERAPY ALERT CARD or IMMUNOTHERAPY ALERT CARD at check-in to the Emergency Department and triage nurse.  Should you have questions after your visit or need to cancel or reschedule your appointment, please contact [facility] - A DEPT OF [facility]  Dept: [phone]  and follow the prompts.  Office hours are 8:00 a.m. to 4:30 p.m. Monday - Friday. Please note that voicemails left after 4:00 p.m. may not be returned until the following business day.  We are closed weekends and major holidays. You have access to a nurse at all times for urgent questions. Please call the main number to the clinic Dept: [phone] and follow the prompts.   For any non-urgent questions, you may also contact your provider using MyChart. We now offer e-Visits for anyone 65 and older to request care online for non-urgent symptoms. For details visit [email or web address].   Also download the MyChart app! Go to the app store, search "MyChart", open the app, select [facility], and log in with your MyChart username and password.

## 2023-11-14 NOTE — Telephone Encounter (Signed)
Notified Patient of completion of Leave and Disability Forms. Fax transmission confirmation received. Copy of forms placed for pick-up as requested by Patient. No other needs or concerns voiced at this time.

## 2023-11-15 ENCOUNTER — Telehealth: Payer: Self-pay | Admitting: *Deleted

## 2023-11-15 ENCOUNTER — Other Ambulatory Visit: Payer: Self-pay

## 2023-11-15 NOTE — Telephone Encounter (Signed)
Called pt to see how she did post chemo.  She reports doing well except had some diarrhea & took imodium & it helped.  She knows her appts & how to reach [place] if needed.

## 2023-11-15 NOTE — Telephone Encounter (Signed)
-----   Message from Nurse Guilford Shi sent at 11/14/2023  3:46 PM EST ----- Regarding: FT chemo FENG Pt able to complete chemo. Oxaliplatin, 5FU, irinotecan liposome, leucovorin. Mosetta Putt

## 2023-11-16 ENCOUNTER — Inpatient Hospital Stay: Payer: No Typology Code available for payment source

## 2023-11-16 VITALS — BP 122/64 | HR 76 | Temp 98.2°F | Resp 17

## 2023-11-16 DIAGNOSIS — Z5111 Encounter for antineoplastic chemotherapy: Secondary | ICD-10-CM | POA: Diagnosis not present

## 2023-11-16 DIAGNOSIS — C250 Malignant neoplasm of head of pancreas: Secondary | ICD-10-CM

## 2023-11-16 MED ORDER — SODIUM CHLORIDE 0.9% FLUSH
10.0000 mL | INTRAVENOUS | Status: DC | PRN
  Administered 2023-11-16: 10 mL

## 2023-11-16 MED ORDER — HEPARIN SOD (PORK) LOCK FLUSH 100 UNIT/ML IV SOLN
500.0000 [IU] | Freq: Once | INTRAVENOUS | Status: AC | PRN
  Administered 2023-11-16: 500 [IU]

## 2023-11-16 MED ORDER — PEGFILGRASTIM INJECTION 6 MG/0.6ML ~~LOC~~
6.0000 mg | PREFILLED_SYRINGE | Freq: Once | SUBCUTANEOUS | Status: AC
Start: 2023-11-16 — End: 2023-11-16
  Administered 2023-11-16: 6 mg via SUBCUTANEOUS
  Filled 2023-11-16: qty 0.6

## 2023-11-21 ENCOUNTER — Encounter: Payer: Self-pay | Admitting: Hematology

## 2023-11-27 ENCOUNTER — Other Ambulatory Visit: Payer: Self-pay | Admitting: Nurse Practitioner

## 2023-11-27 ENCOUNTER — Encounter: Payer: Self-pay | Admitting: Hematology

## 2023-11-27 NOTE — Progress Notes (Signed)
 Patient called stating her cancer has returned and was asking about financial assistance. At this time patient does not qualify to apply for the grant due to being insured and not on government assistance. She verbalized understanding.  She has my card for any additional financial questions or concerns.

## 2023-11-27 NOTE — Assessment & Plan Note (Signed)
 stage IIB, [name], [name] -Diagnosed 08/24/20 on EUS with Dr Burnette, which showed mass at head of pancreas, s/p stenting, with SMV and PV invasion, biopsy confirmed adenocarcinoma with few malignant-appearing LNs in peripancreatic and porta hepatis region. Staging was negative for metastatic disease.  -she completed neoadjuvant chemo FOLFIRINOX q2 weeks 09/12/20 - 12/29/20. -s/p whipple surgery by Dr Debora on 02/08/21, path showed no residual disease.  -due to the high recurrence risk of pancreatic cancer, she is under close surveillance.  -Her PET scan from September 30, 2023 showed hypermetabolic soft tissue mass at the previous Whipple surgical site, and a small mildly hypermetabolic nodule in the left lung, concerning for metastatic recurrence. -lung biopsy 10/28/2023 confirmed adenocarcinoma, IHC consistent with metastatic pancreatic ca.  -she started chemo FOLFIRI with liposomal irinotecan  on 11/14/23

## 2023-11-28 ENCOUNTER — Inpatient Hospital Stay: Payer: BC Managed Care – PPO

## 2023-11-28 ENCOUNTER — Inpatient Hospital Stay (HOSPITAL_BASED_OUTPATIENT_CLINIC_OR_DEPARTMENT_OTHER): Payer: BC Managed Care – PPO | Admitting: Hematology

## 2023-11-28 ENCOUNTER — Encounter: Payer: Self-pay | Admitting: Hematology

## 2023-11-28 ENCOUNTER — Inpatient Hospital Stay: Payer: BC Managed Care – PPO | Attending: Hematology

## 2023-11-28 VITALS — BP 118/58 | HR 63 | Temp 98.7°F | Resp 15 | Wt 126.9 lb

## 2023-11-28 DIAGNOSIS — Z5189 Encounter for other specified aftercare: Secondary | ICD-10-CM | POA: Diagnosis not present

## 2023-11-28 DIAGNOSIS — C250 Malignant neoplasm of head of pancreas: Secondary | ICD-10-CM | POA: Insufficient documentation

## 2023-11-28 DIAGNOSIS — Z95828 Presence of other vascular implants and grafts: Secondary | ICD-10-CM

## 2023-11-28 DIAGNOSIS — D649 Anemia, unspecified: Secondary | ICD-10-CM | POA: Diagnosis not present

## 2023-11-28 DIAGNOSIS — Z5111 Encounter for antineoplastic chemotherapy: Secondary | ICD-10-CM | POA: Diagnosis present

## 2023-11-28 LAB — CMP (CANCER CENTER ONLY)
ALT: 11 U/L (ref 0–44)
AST: 13 U/L — ABNORMAL LOW (ref 15–41)
Albumin: 3.4 g/dL — ABNORMAL LOW (ref 3.5–5.0)
Alkaline Phosphatase: 89 U/L (ref 38–126)
Anion gap: 7 (ref 5–15)
BUN: 14 mg/dL (ref 8–23)
CO2: 27 mmol/L (ref 22–32)
Calcium: 8.3 mg/dL — ABNORMAL LOW (ref 8.9–10.3)
Chloride: 103 mmol/L (ref 98–111)
Creatinine: 0.9 mg/dL (ref 0.44–1.00)
GFR, Estimated: >60 mL/min (ref >60)
Glucose, Bld: 154 mg/dL — ABNORMAL HIGH (ref 70–99)
Potassium: 3 mmol/L — ABNORMAL LOW (ref 3.5–5.1)
Sodium: 137 mmol/L (ref 135–145)
Total Bilirubin: 0.5 mg/dL (ref 0.0–1.2)
Total Protein: 5.9 g/dL — ABNORMAL LOW (ref 6.5–8.1)

## 2023-11-28 LAB — CBC WITH DIFFERENTIAL (CANCER CENTER ONLY)
Abs Immature Granulocytes: 0.09 10*3/uL — ABNORMAL HIGH (ref 0.00–0.07)
Basophils Absolute: 0 10*3/uL (ref 0.0–0.1)
Basophils Relative: 1 %
Eosinophils Absolute: 0 10*3/uL (ref 0.0–0.5)
Eosinophils Relative: 1 %
HCT: 33.7 % — ABNORMAL LOW (ref 36.0–46.0)
Hemoglobin: 11.7 g/dL — ABNORMAL LOW (ref 12.0–15.0)
Immature Granulocytes: 2 %
Lymphocytes Relative: 26 %
Lymphs Abs: 1.6 10*3/uL (ref 0.7–4.0)
MCH: 28.7 pg (ref 26.0–34.0)
MCHC: 34.7 g/dL (ref 30.0–36.0)
MCV: 82.8 fL (ref 80.0–100.0)
Monocytes Absolute: 0.6 10*3/uL (ref 0.1–1.0)
Monocytes Relative: 9 %
Neutro Abs: 3.7 10*3/uL (ref 1.7–7.7)
Neutrophils Relative %: 61 %
Platelet Count: 315 10*3/uL (ref 150–400)
RBC: 4.07 MIL/uL (ref 3.87–5.11)
RDW: 12.8 % (ref 11.5–15.5)
WBC Count: 6 10*3/uL (ref 4.0–10.5)
nRBC: 0 % (ref 0.0–0.2)

## 2023-11-28 MED ORDER — PALONOSETRON HCL INJECTION 0.25 MG/5ML
0.2500 mg | Freq: Once | INTRAVENOUS | Status: AC
  Administered 2023-11-28: 0.25 mg via INTRAVENOUS
  Filled 2023-11-28: qty 5

## 2023-11-28 MED ORDER — DEXAMETHASONE SODIUM PHOSPHATE 10 MG/ML IJ SOLN
10.0000 mg | Freq: Once | INTRAMUSCULAR | Status: AC
  Administered 2023-11-28: 10 mg via INTRAVENOUS
  Filled 2023-11-28: qty 1

## 2023-11-28 MED ORDER — SODIUM CHLORIDE 0.9 % IV SOLN
2400.0000 mg/m2 | INTRAVENOUS | Status: DC
  Administered 2023-11-28: 3900 mg via INTRAVENOUS
  Filled 2023-11-28: qty 78

## 2023-11-28 MED ORDER — SODIUM CHLORIDE 0.9 % IV SOLN
50.0000 mg/m2 | Freq: Once | INTRAVENOUS | Status: AC
  Administered 2023-11-28: 81.7 mg via INTRAVENOUS
  Filled 2023-11-28: qty 19

## 2023-11-28 MED ORDER — FAMOTIDINE IN NACL 20-0.9 MG/50ML-% IV SOLN
20.0000 mg | Freq: Once | INTRAVENOUS | Status: AC
  Administered 2023-11-28: 20 mg via INTRAVENOUS
  Filled 2023-11-28: qty 50

## 2023-11-28 MED ORDER — LEUCOVORIN CALCIUM INJECTION 350 MG
400.0000 mg/m2 | Freq: Once | INTRAMUSCULAR | Status: AC
  Administered 2023-11-28: 648 mg via INTRAVENOUS
  Filled 2023-11-28: qty 32.4

## 2023-11-28 MED ORDER — LORATADINE 10 MG PO TABS
10.0000 mg | ORAL_TABLET | Freq: Once | ORAL | Status: AC
  Administered 2023-11-28: 10 mg via ORAL
  Filled 2023-11-28: qty 1

## 2023-11-28 MED ORDER — DEXTROSE 5 % IV SOLN: INTRAVENOUS | Status: DC

## 2023-11-28 MED ORDER — SODIUM CHLORIDE 0.9% FLUSH
10.0000 mL | INTRAVENOUS | Status: DC | PRN
Start: 2023-11-28 — End: 2023-11-28
  Administered 2023-11-28: 10 mL

## 2023-11-28 MED ORDER — OXALIPLATIN CHEMO INJECTION 100 MG/20ML
60.0000 mg/m2 | Freq: Once | INTRAVENOUS | Status: AC
  Administered 2023-11-28: 100 mg via INTRAVENOUS
  Filled 2023-11-28: qty 20

## 2023-11-28 MED ORDER — ATROPINE SULFATE 1 MG/ML IV SOLN
0.5000 mg | Freq: Once | INTRAVENOUS | Status: AC | PRN
  Administered 2023-11-28: 0.5 mg via INTRAVENOUS
  Filled 2023-11-28: qty 1

## 2023-11-28 NOTE — Patient Instructions (Signed)
 [facility] - A DEPT OF [facility]  Discharge Instructions: Thank you for choosing [facility] to provide your oncology and hematology care.   If you have a lab appointment with the Cancer Center, please go directly to the Cancer Center and check in at the registration area.   Wear comfortable clothing and clothing appropriate for easy access to any Portacath or PICC line.   We strive to give you quality time with your provider. You may need to reschedule your appointment if you arrive late (15 or more minutes).  Arriving late affects you and other patients whose appointments are after yours.  Also, if you miss three or more appointments without notifying the office, you may be dismissed from the clinic at the provider's discretion.      For prescription refill requests, have your pharmacy contact our office and allow 72 hours for refills to be completed.    Today you received the following chemotherapy and/or immunotherapy agents Irinotecan  Liposome, Leucovorin , Oxaliplatin , Fluorourcil.    To help prevent nausea and vomiting after your treatment, we encourage you to take your nausea medication as directed.  BELOW ARE SYMPTOMS THAT SHOULD BE REPORTED IMMEDIATELY: *FEVER GREATER THAN 100.4 F (38 C) OR HIGHER *CHILLS OR SWEATING *NAUSEA AND VOMITING THAT IS NOT CONTROLLED WITH YOUR NAUSEA MEDICATION *UNUSUAL SHORTNESS OF BREATH *UNUSUAL BRUISING OR BLEEDING *URINARY PROBLEMS (pain or burning when urinating, or frequent urination) *BOWEL PROBLEMS (unusual diarrhea, constipation, pain near the anus) TENDERNESS IN MOUTH AND THROAT WITH OR WITHOUT PRESENCE OF ULCERS (sore throat, sores in mouth, or a toothache) UNUSUAL RASH, SWELLING OR PAIN  UNUSUAL VAGINAL DISCHARGE OR ITCHING   Items with * indicate a potential emergency and should be followed up as soon as possible or go to the Emergency Department if any problems should occur.  Please show  the CHEMOTHERAPY ALERT CARD or IMMUNOTHERAPY ALERT CARD at check-in to the Emergency Department and triage nurse.  Should you have questions after your visit or need to cancel or reschedule your appointment, please contact [facility] - A DEPT OF [facility]  Dept: [phone]  and follow the prompts.  Office hours are 8:00 a.m. to 4:30 p.m. Monday - Friday. Please note that voicemails left after 4:00 p.m. may not be returned until the following business day.  We are closed weekends and major holidays. You have access to a nurse at all times for urgent questions. Please call the main number to the clinic Dept: [phone] and follow the prompts.   For any non-urgent questions, you may also contact your provider using MyChart. We now offer e-Visits for anyone 62 and older to request care online for non-urgent symptoms. For details visit [email or web address].   Also download the MyChart app! Go to the app store, search MyChart, open the app, select [place], and log in with your MyChart username and password.

## 2023-11-28 NOTE — Progress Notes (Signed)
 [facility]   [phone] [phone]) [phone]   Clinic Follow up Note   Patient Care Team: [name], DO as PCP - General (Internal Medicine) Lanny Callander, MD as Consulting Physician (Oncology) Dasie Leonor CROME, MD as Consulting Physician (General Surgery)  Date of Service:  11/28/2023  CHIEF COMPLAINT: f/u of recurrent pancreatic cancer  CURRENT THERAPY:  Chemotherapy NALIRIFOX every 2 weeks  Oncology History   Pancreatic cancer (HCC) stage IIB, [name], [name] -Diagnosed 08/24/20 on EUS with Dr Burnette, which showed mass at head of pancreas, s/p stenting, with SMV and PV invasion, biopsy confirmed adenocarcinoma with few malignant-appearing LNs in peripancreatic and porta hepatis region. Staging was negative for metastatic disease.  -she completed neoadjuvant chemo FOLFIRINOX q2 weeks 09/12/20 - 12/29/20. -s/p whipple surgery by Dr Debora on 02/08/21, path showed no residual disease.  -due to the high recurrence risk of pancreatic cancer, she is under close surveillance.  -Her PET scan from September 30, 2023 showed hypermetabolic soft tissue mass at the previous Whipple surgical site, and a small mildly hypermetabolic nodule in the left lung, concerning for metastatic recurrence. -lung biopsy 10/28/2023 confirmed adenocarcinoma, IHC consistent with metastatic pancreatic ca.  -she started chemo FOLFIRI with liposomal irinotecan  on 11/14/23      Assessment and Plan   Pancreatic cancer  Undergoing chemotherapy with nausea and diarrhea post-first cycle, resulting in a five-pound weight loss. Mild anemia (hemoglobin 11.7). Advised on proactive use of anti-nausea and anti-diarrheal medications and nutritional supplements to prevent further weight loss. Discussed potential dose reduction if significant weight loss occurs in the next cycle. - Administer Compazine  for nausea, one in the morning and as needed, especially on days 3-5 post-chemotherapy - Use Imodium at  the onset of loose bowel movements - Consume nutritional supplements like Ensure or Boost, starting with samples to determine tolerance - Monitor weight and consider dose reduction if significant weight loss occurs in the next cycle - Proceed with scheduled chemotherapy infusion at 11:00 AM - Follow up in two weeks  Skin Reaction Localized itchy spots resembling bug bites, likely a reaction to chemotherapy. - Apply hydrocortisone cream to affected areas  General Health Maintenance Taking a high-dose weekly vitamin D supplement prescribed by Dr. Juliene. - Continue high-dose weekly vitamin D supplement.     Plan Lab reviewed, adequate for treatment, will proceed with second cycle chemotherapy today and continue every 2 weeks -f/u in 2 weeks     SUMMARY OF ONCOLOGIC HISTORY: Oncology History Overview Note  Cancer Staging Pancreatic cancer [facility]) Staging form: Exocrine Pancreas, AJCC 8th Edition - Clinical stage from 08/24/2020: Stage IIB (cT2, cN1, cM0) - Signed by Lanny Callander, MD on 08/30/2020 Stage prefix: Initial diagnosis    Pancreatic cancer (HCC)  08/20/2020 Imaging   CT AP 08/20/20  IMPRESSION: 1. 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 Collateral circulation is noted. There is moderate intrahepatic and extrahepatic biliary dilatation which appears to be due to the pancreatic head mass. Pancreatic ductal dilatation is noted as well. 2. Probable 2.7 cm uterine fibroid. 3. Aortic atherosclerosis.   Aortic Atherosclerosis (ICD10-I70.0).   08/20/2020 Tumor Marker   Ca19-9 - 927   08/22/2020 Procedure   ERCP by Dr Rosalie 08/22/20  IMPRESSION - The major papilla appeared normal. - A biliary sphincterotomy was performed. -  Cells for cytology obtained in the lower third and middle of the main  duct. - One plastic stent was placed into the common bile duct.   FINAL MICROSCOPIC DIAGNOSIS:  - No malignant cells identified  - Benign reactive/reparative changes   08/24/2020 Cancer Staging   Staging form: Exocrine Pancreas, AJCC 8th Edition - Clinical stage from 08/24/2020: Stage IIB (cT2, cN1, cM0) - Signed by Lanny Callander, MD on 08/30/2020   08/24/2020 Procedure   EUS by Dr burnette 08/24/20  IMPRESSION - There was no sign of significant pathology in the ampulla. - A few malignant-appearing lymph nodes were visualized in the peripancreatic region and porta hepatis region. - One stent was visualized endosonographically in the common bile duct. - A mass was identified in the pancreatic head. Tissue was obtained from this exam. The preliminary diagnosis is consistent with adenocarcinoma. Invasion into SMV/PV seen. Lymphadenopathy noted. This was staged T3 N1 Mx by endosonographic criteria. Fine needle aspiration performed.   08/24/2020 Initial Biopsy   FINAL MICROSCOPIC DIAGNOSIS: 08/24/20 - Malignant cells consistent with adenocarcinoma    08/30/2020 Initial Diagnosis   Pancreatic cancer (HCC)   09/05/2020 Imaging   CT Chest  IMPRESSION: 1. Stable 2.7 cm infiltrating pancreatic head mass with borderline enlarged peripancreatic lymph nodes. 2. No findings for pulmonary metastatic disease. 3. Small hiatal hernia. 4. Aortic atherosclerosis.   Aortic Atherosclerosis (ICD10-I70.0).   09/08/2020 Procedure   INSERTION PORT-A-CATH by Dr Dasie and Aron    09/12/2020 - 12/29/2020 Chemotherapy   Neoadjuvant FOLFIRINOX q2 weeks starting 09/12/20-12/29/20    12/02/2020 Imaging   CT CAP  IMPRESSION: 1. Previously noted mass of the central pancreatic head is almost entirely resolved, difficult to discretely appreciate on current examination. Findings are consistent with treatment response. There remains obstruction of the pancreatic duct near the head neck junction with mild  prominence of the pancreatic duct, measuring up to 5 mm, with atrophy of the distal pancreatic parenchyma. 2. The portal vein, splenic vein, and superior mesenteric vein are now widely patent, previously effaced at the confluence by mass effect. 3. Interval placement of common bile duct stent, tip positioned in the distal duodenum, with relief of previously seen biliary ductal dilatation. 4. 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 Following the bifurcation of the celiac mesenteric trunk, the celiac axis appears to directly traverse the vicinity of the mass, although there does appear to be a fat plane about the vessel. 5. The distal small bowel and colon are diffusely somewhat hyperenhancing and inflamed appearing with vascular combing and a tethered appearance of the distal small bowel. This appearance generally suggests inflammatory bowel disease such as Crohn's disease. Correlate with referable clinical history, if present. No evidence of obstruction or other acute complication. 6. Hepatic steatosis. 7. Aortic atherosclerosis.   12/28/2020 Genetic Testing   Negative hereditary cancer genetic testing: no pathogenic variants detected in [employer] Common Hereditary Cancers Panel.  The report date is December 28, 2020.    The Common Hereditary Cancers Panel offered by [employer] includes sequencing and/or deletion duplication testing of the following 47 genes: APC, ATM, AXIN2, BARD1, BMPR1A, BRCA1, BRCA2, BRIP1, CDH1, CDK4, CDKN2A (p14ARF), CDKN2A (p16INK4a), CHEK2, CTNNA1, DICER1, EPCAM (Deletion/duplication testing only), GREM1 (promoter region deletion/duplication testing only), GREM1, HOXB13, KIT, MEN1, MLH1, MSH2, MSH3, MSH6, MUTYH, NBN, NF1, NHTL1, PALB2, PDGFRA, PMS2, POLD1, POLE, PTEN, RAD50, RAD51C, RAD51D,  SDHA, SDHB, SDHC,  SDHD, SMAD4, SMARCA4. STK11, TP53, TSC1, TSC2, and VHL.  The following genes were evaluated for sequence changes only: SDHA and HOXB13 c.251G>A variant only.   01/17/2021 Imaging   CT CAP [facility] IMPRESSION Small lesion in the pancreatic head measuring approximately 1.1 x 0.8 cm without evidence of vascular involvement or metastasis consistent with previously described, biopsy-proven pancreatic adenocarcinoma.   02/08/2021 Surgery   Whipple Surgery with Dr Abran Kallman  Final Pathologic Diagnosis      A.  GALLBLADDER, CHOLECYSTECTOMY: Chronic cholecystitis. No malignancy identified.   B.  BILE DUCT STENT, REMOVAL (GROSS ONLY DIAGNOSIS): Stent, see gross description.   C.  WHIPPLE RESECTION: No residual malignancy identified. Chronic and acute inflammation with granulation tissue reaction, fibrosis and features of chronic pancreatitis, suggestive of therapy effect. Fourteen lymph nodes, negative for metastasis (0/14). Margins negative for malignancy.        06/07/2021 Imaging   CT AP  IMPRESSION: 1. No current findings of recurrent malignancy. Interval Whipple procedure with expected postoperative findings. 2. A 3.5 cm stent is present in the dorsal pancreatic duct. No duct dilatation. There is an approximately 1.5 mm lucency centrally along this stent shown on image 43 series 7, possibilities include stent fracture, two separate stents, or an intentional radial lucency in this type of stent. 3. Small type 1 hiatal hernia. Mild distal esophageal wall thickening may reflect low-grade esophagitis. 4.  Aortic Atherosclerosis (ICD10-I70.0). 5.  Prominent stool throughout the colon favors constipation. 6. Uterine fibroid. 7. 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   11/14/2023 -  Chemotherapy   Patient is on Treatment Plan : PANCREAS NALIRIFOX D1, 15 Q28D     Port-A-Cath in place     Discussed the use of AI scribe software for  clinical note transcription with the patient, who gave verbal consent to proceed.  History of Present Illness   The patient, a 63 year old female with a history of recurrent cancer, presents for a follow-up after her first cycle of chemotherapy. She reports tolerating the treatment better than previous ones, being able to walk out of the hospital independently. However, she experienced nausea and vomiting after the procedure, which she managed with anti-nausea medication. She also reports diarrhea, which has been a significant issue, but it has slowed down to once a day. She has been taking Imodium for this. She lost five pounds after the treatment due to decreased appetite and inability to eat for two days. Despite these side effects, she was able to function the first week after chemotherapy. She also mentions having a port for chemotherapy, which was placed a month ago. She noticed some itchy spots around the port site, which she describes as looking like bug bites.         All other systems were reviewed with the patient and are negative.  MEDICAL HISTORY:  Past Medical History:  Diagnosis Date   Anemia    Anxiety    Cancer (HCC) 07/2020   pancreatic cancer   Depression    Diabetes mellitus without complication (HCC) 07/2020   pancreatic cancer   High cholesterol    History of blood transfusion    History of hiatal hernia    small   Hypertension    Stroke [facility])    age 8, no residual effect    SURGICAL HISTORY: Past Surgical History:  Procedure Laterality Date   BILIARY BRUSHING  08/22/2020   Procedure: BILIARY BRUSHING;  Surgeon: Magod,  Oliva, MD;  Location: [facility];  Service: Endoscopy;;   BILIARY STENT PLACEMENT N/A 08/22/2020   Procedure: BILIARY STENT PLACEMENT;  Surgeon: Rosalie Oliva, MD;  [facility];  Service: Endoscopy;  Laterality: N/A;   BRONCHIAL BIOPSY  10/28/2023   Procedure: BRONCHIAL BIOPSIES;  Surgeon: Shelah Lamar RAMAN, MD;  Location: [facility] END[facility]COPY;   Service: Pulmonary;;   BRONCHIAL BRUSHINGS  10/28/2023   Procedure: BRONCHIAL BRUSHINGS;  Surgeon: Shelah Lamar RAMAN, MD;  Location: [facility]OSCOPY;  Service: Pulmonary;;   BRONCHIAL NEEDLE ASPIRATION BIOPSY  10/28/2023   Procedure: BRONCHIAL NEEDLE ASPIRATION BIOPSIES;  Surgeon: Shelah Lamar RAMAN, MD;  Location: [facility];  Service: Pulmonary;;   ENDOSCOPIC RETROGRADE CHOLANGIOPANCREATOGRAPHY (ERCP) WITH PROPOFOL  N/A 08/22/2020   Procedure: ENDOSCOPIC RETROGRADE CHOLANGIOPANCREATOGRAPHY (ERCP) WITH PROPOFOL ;  Surgeon: Rosalie Oliva, MD;  Location[facility];  Service: Endoscopy;  Laterality: N/A;   ESOPHAGOGASTRODUODENOSCOPY (EGD) WITH PROPOFOL  N/A 08/24/2020   Procedure: ESOPHAGOGASTRODUODENOSCOPY (EGD) WITH PROPOFOL ;  Surgeon: Burnette Fallow, MD;  Location: [facility];  Service: Endoscopy;  Laterality: N/A;   FIDUCIAL MARKER PLACEMENT  10/28/2023   Procedure: FIDUCIAL MARKER PLACEMENT;  Surgeon: Shelah Lamar RAMAN, MD;  Location: [facility] ENDOSCOPY;  Service: Pulmonary;;   FINE NEEDLE ASPIRATION N/A 08/24/2020   Procedure: FINE NEEDLE ASPIRATION (FNA) LINEAR;  Surgeon: Burnette Fallow, MD;  Location[facility];  Service: Endoscopy;  Laterality: N/A;   PORT-A-CATH REMOVAL N/A 12/08/2021   Procedure: REMOVAL PORT-A-CATH;  Surgeon: Dasie Leonor CROME, MD;  Location: [facility] OR;  Service: General;  Laterality: N/A;   PORTACATH PLACEMENT Right 09/08/2020   Procedure: INSERTION PORT-A-CATH;  Surgeon: Dasie Leonor CROME, MD;  Location: [facility];  Service: General;  Laterality: Right;   PORTACATH PLACEMENT N/A 11/06/2023   Procedure: INSERTION PORT-A-CATH;  Surgeon: Dasie Leonor CROME, MD;  Location[facility];  Service: General;  Laterality: N/A;  LMA   SPHINCTEROTOMY  08/22/2020   Procedure: ANNETT;  Surgeon: Rosalie Oliva, MD;  Location: [facility];  Service: Endoscopy;;   UPPER ESOPHAGEAL ENDOSCOPIC ULTRASOUND (EUS) N/A 08/24/2020   Procedure: UPPER ESOPHAGEAL ENDOSCOPIC ULTRASOUND (EUS);  Surgeon:  Burnette Fallow, MD;  Location: [facility];  Service: Endoscopy;  Laterality: N/A;    I have reviewed the social history and family history with the patient and they are unchanged from previous note.  ALLERGIES:  is allergic to tyloxapol, oxycodone -acetaminophen , and poison ivy extract.  MEDICATIONS:  Current Outpatient Medications  Medication Sig Dispense Refill   acetaminophen  (TYLENOL ) 325 MG tablet Take 650 mg by mouth every 6 (six) hours as needed for moderate pain (pain score 4-6).     busPIRone (BUSPAR) 5 MG tablet Take 5 mg by mouth daily.     citalopram  (CELEXA ) 40 MG tablet Take 40 mg by mouth every evening.     CREON  36000-114000 units CPEP capsule TAKE 2 CAPSULES BY MOUTH 3 TIMES A DAY WITH A MEAL. MAY ALSO TAKE 1 CAPSULE AS NEEDED WITH SNACKS 240 capsule 2   dexamethasone  (DECADRON ) 4 MG tablet Take 2 tablets (8 mg total) by mouth daily. Start the day after irinotecan  chemotherapy for 2 days. Take with food. 8 tablet 5   hydrochlorothiazide  (MICROZIDE ) 12.5 MG capsule Take 12.5 mg by mouth in the morning.     lidocaine -prilocaine  (EMLA ) cream Apply to affected area once 30 g 3   magnesium  oxide (MAG-OX) 400 (241.3 Mg) MG tablet Take 1 tablet (400 mg total) by mouth every evening.     ondansetron  (ZOFRAN ) 8 MG tablet Take 1 tablet (8  mg total) by mouth every 8 (eight) hours as needed for nausea or vomiting. Start on the third day after irinotecan  30 tablet 1   oxyCODONE -acetaminophen  (PERCOCET/ROXICET) 5-325 MG tablet Take 1 tablet by mouth every 6 (six) hours as needed for severe pain (pain score 7-10). 12 tablet 0   potassium chloride  (KLOR-CON ) 10 MEQ tablet TAKE 1 TABLET BY MOUTH EVERY DAY (Patient taking differently: Take 10 mEq by mouth 2 (two) times daily.) 90 tablet 1   prochlorperazine  (COMPAZINE ) 10 MG tablet Take 1 tablet (10 mg total) by mouth every 6 (six) hours as needed for nausea or vomiting. 30 tablet 1   simvastatin (ZOCOR) 80 MG tablet Take 80 mg by mouth every  evening.     No current facility-administered medications for this visit.   Facility-Administered Medications Ordered in Other Visits  Medication Dose Route Frequency Provider Last Rate Last Admin   dextrose  5 % solution   Intravenous Continuous Lanny Callander, MD 10 mL/hr at 11/28/23 1127 New Bag at 11/28/23 1127   fluorouracil  (ADRUCIL ) 3,900 mg in sodium chloride  0.9 % 72 mL chemo infusion  2,400 mg/m2 (Treatment Plan Recorded) Intravenous 1 day or 1 dose Lanny Callander, MD       leucovorin  648 mg in dextrose  5 % 250 mL infusion  400 mg/m2 (Treatment Plan Recorded) Intravenous Once Lanny Callander, MD 141 mL/hr at 11/28/23 1427 648 mg at 11/28/23 1427   oxaliplatin  (ELOXATIN ) 100 mg in dextrose  5 % 500 mL chemo infusion  60 mg/m2 (Treatment Plan Recorded) Intravenous Once Lanny Callander, MD 260 mL/hr at 11/28/23 1424 100 mg at 11/28/23 1424    PHYSICAL EXAMINATION: ECOG PERFORMANCE STATUS: 1 - Symptomatic but completely ambulatory  Vitals:   11/28/23 1003  BP: (!) 118/58  Pulse: 63  Resp: 15  Temp: 98.7 F (37.1 C)  SpO2: 100%   Wt Readings from Last 3 Encounters:  11/28/23 126 lb 14.4 oz (57.6 kg)  11/14/23 131 lb 9.6 oz (59.7 kg)  11/10/23 128 lb (58.1 kg)     GENERAL:alert, no distress and comfortable SKIN: skin color, texture, turgor are normal, no rashes or significant lesions except a few skin rash on the lateral left chest wall EYES: normal, Conjunctiva are pink and non-injected, sclera clear NECK: supple, thyroid normal size, non-tender, without nodularity LYMPH:  no palpable lymphadenopathy in the cervical, axillary  LUNGS: clear to auscultation and percussion with normal breathing effort HEART: regular rate & rhythm and no murmurs and no lower extremity edema ABDOMEN:abdomen soft, non-tender and normal bowel sounds Musculoskeletal:no cyanosis of digits and no clubbing  NEURO: alert & oriented x 3 with fluent speech, no focal motor/sensory deficits    LABORATORY DATA:  I have  reviewed the data as listed    Latest Ref Rng & Units 11/28/2023    9:34 AM 11/14/2023    8:58 AM 10/31/2023    7:52 AM  CBC  WBC 4.0 - 10.5 K/uL 6.0  8.4  7.3   Hemoglobin 12.0 - 15.0 g/dL 88.2  88.3  86.4   Hematocrit 36.0 - 46.0 % 33.7  35.3  42.2   Platelets 150 - 400 K/uL 315  257  317         Latest Ref Rng & Units 11/28/2023    9:34 AM 11/14/2023    8:58 AM 10/31/2023    7:52 AM  CMP  Glucose 70 - 99 mg/dL 845  892  804   BUN 8 - 23 mg/dL 14  16  15   Creatinine 0.44 - 1.00 mg/dL 9.09  9.17  9.03   Sodium 135 - 145 mmol/L 137  139  141   Potassium 3.5 - 5.1 mmol/L 3.0  3.5  4.5   Chloride 98 - 111 mmol/L 103  106  103   CO2 22 - 32 mmol/L 27  26  29    Calcium  8.9 - 10.3 mg/dL 8.3  8.7  9.3   Total Protein 6.5 - 8.1 g/dL 5.9  6.6  7.3   Total Bilirubin 0.0 - 1.2 mg/dL 0.5  0.9  0.9   Alkaline Phos 38 - 126 U/L 89  77  79   AST 15 - 41 U/L 13  20  43   ALT 0 - 44 U/L 11  27  43       RADIOGRAPHIC STUDIES: I have personally reviewed the radiological images as listed and agreed with the findings in the report. No results found.    No orders of the defined types were placed in this encounter.  All questions were answered. The patient knows to call the clinic with any problems, questions or concerns. No barriers to learning was detected. The total time spent in the appointment was 30 minutes.     Onita Mattock, MD 11/28/2023

## 2023-11-30 ENCOUNTER — Inpatient Hospital Stay: Payer: BC Managed Care – PPO

## 2023-11-30 VITALS — BP 128/63 | HR 63 | Temp 98.2°F | Resp 18

## 2023-11-30 DIAGNOSIS — C250 Malignant neoplasm of head of pancreas: Secondary | ICD-10-CM

## 2023-11-30 LAB — CANCER ANTIGEN 19-9: CA 19-9: 216 U/mL — ABNORMAL HIGH (ref 0–35)

## 2023-11-30 MED ORDER — PEGFILGRASTIM-JMDB 6 MG/0.6ML ~~LOC~~ SOSY
6.0000 mg | PREFILLED_SYRINGE | Freq: Once | SUBCUTANEOUS | Status: AC
Start: 2023-11-30 — End: 2023-11-30
  Administered 2023-11-30: 6 mg via SUBCUTANEOUS

## 2023-11-30 MED ORDER — HEPARIN SOD (PORK) LOCK FLUSH 100 UNIT/ML IV SOLN
500.0000 [IU] | Freq: Once | INTRAVENOUS | Status: AC | PRN
  Administered 2023-11-30: 500 [IU]

## 2023-11-30 MED ORDER — SODIUM CHLORIDE 0.9% FLUSH
10.0000 mL | INTRAVENOUS | Status: DC | PRN
  Administered 2023-11-30: 10 mL

## 2023-12-09 ENCOUNTER — Telehealth: Payer: Self-pay

## 2023-12-09 ENCOUNTER — Other Ambulatory Visit: Payer: Self-pay

## 2023-12-09 NOTE — Telephone Encounter (Signed)
Patient called in stating she has had diarrhea and some vomiting over the weekend. She has been taking only one imodium after each bowel movement. I instructed patient to take 2 imodium after each bowel movement and not to exceed 4 doses in a day. She has Pedialyte to drink to keep her hydrated I made patient aware of eating a BRAT diet to make it easier to take meds. She stated she would take the imodium as instructed and drink the Pedialyte and would call us tomorrow if it was no better.

## 2023-12-11 NOTE — Assessment & Plan Note (Addendum)
stage IIB, [name], [name] -Diagnosed 08/24/20 on EUS with Dr Dulce Sellar, which showed mass at head of pancreas, s/p stenting, with SMV and PV invasion, biopsy confirmed adenocarcinoma with few malignant-appearing LNs in peripancreatic and porta hepatis region. Staging was negative for metastatic disease.  -she completed neoadjuvant chemo FOLFIRINOX q2 weeks 09/12/20 - 12/29/20. -s/p whipple surgery by Dr Flonnie Hailstone on 02/08/21, path showed no residual disease.  -due to the high recurrence risk of pancreatic cancer, she is under close surveillance.  -Her PET scan from September 30, 2023 showed hypermetabolic soft tissue mass at the previous Whipple surgical site, and a small mildly hypermetabolic nodule in the left lung, concerning for metastatic recurrence. -lung biopsy 10/28/2023 confirmed adenocarcinoma, IHC consistent with metastatic pancreatic ca.  -she started chemo FOLFIRI with liposomal irinotecan on 11/14/23  -- Had been having nausea and vomiting with a 5 pound weight loss in first 2 weeks of treatment. -severe diarrhea and nausea continued after 2nd treatment. She had additional 5 pounds weight loss.  -12/12/2023 -defer treatment for 1 week and proceed with cycle 2 day 1 with reduced dose liposomal irinotecan to 50%. Will consider changing to irinotecan if diarrhea and severe fatigue continue with additional dose reduction.

## 2023-12-11 NOTE — Progress Notes (Addendum)
Patient Care Team: Leola Brazil, DO as PCP - General (Internal Medicine) Malachy Mood, MD as Consulting Physician (Oncology) Fritzi Mandes, MD as Consulting Physician (General Surgery)  Clinic Day:  12/12/2023  Referring physician: Malachy Mood, MD  ASSESSMENT & PLAN:   Assessment & Plan: Pancreatic cancer [facility]) stage IIB, [name], [name] -Diagnosed 08/24/20 on EUS with Dr Dulce Sellar, which showed mass at head of pancreas, s/p stenting, with SMV and PV invasion, biopsy confirmed adenocarcinoma with few malignant-appearing LNs in peripancreatic and porta hepatis region. Staging was negative for metastatic disease.  -she completed neoadjuvant chemo FOLFIRINOX q2 weeks 09/12/20 - 12/29/20. -s/p whipple surgery by Dr Flonnie Hailstone on 02/08/21, path showed no residual disease.  -due to the high recurrence risk of pancreatic cancer, she is under close surveillance.  -Her PET scan from September 30, 2023 showed hypermetabolic soft tissue mass at the previous Whipple surgical site, and a small mildly hypermetabolic nodule in the left lung, concerning for metastatic recurrence. -lung biopsy 10/28/2023 confirmed adenocarcinoma, IHC consistent with metastatic pancreatic ca.  -she started chemo FOLFIRI with liposomal irinotecan on 11/14/23  -- Had been having nausea and vomiting with a 5 pound weight loss in first 2 weeks of treatment. -severe diarrhea and nausea continued after 2nd treatment. She had additional 5 pounds weight loss.  -12/12/2023 -defer treatment for 1 week and proceed with cycle 2 day 1 with reduced dose liposomal irinotecan to 50%. Will consider changing to irinotecan if diarrhea and severe fatigue continue with additional dose reduction.    Plan: Patient seen with Dr. Mosetta Putt today.  Labs reviewed  -CBC showing WBC 6.9; Hgb 11.3; Hct 33.6; Plt 324; Anc 4.9 -CMP - K 3.1; glucose 138; BUN 12; Creatinine 0.87; eGFR >60; Ca 8.1; LFTs normal.   Due to severe diarrhea and fatigue, along with 5 pound  weight loss since 2nd dose chemotherapy, will postpone today's treatment for 1 week.  Advised BRAT diet and plenty of liquids.  Will add mirtazapine 7.5 mg every evening to help improve appetite.  Labs and treatment in one week with reduced dose liposomal Irinotecan to 50%.  Will change to irinotecan if severe diarrhea and weight loss continue after additional dose reduction liposomal irinotecan.  Patient's port de-accessed.  Will notify patient of time to arrive next week for labs and infusion.  Plan to follow up via phone visit a week after next treatment to evaluate symptoms.  Patient and her husband both voice understanding of current plan.   The patient understands the plans discussed today and is in agreement with them.  She knows to contact our office if she develops concerns prior to her next appointment.  I provided 25 minutes of face-to-face time during this encounter and > 50% was spent counseling as documented under my assessment and plan.    Carlean Jews, NP  [facility] - A DEPT OF [facility] [address] [place] [address] Dept: [phone] Dept Fax: [phone]   No orders of the defined types were placed in this encounter.     CHIEF COMPLAINT:  CC: pancreatic cancer   Current Treatment:  chemotherapy NALIRIFOX every 2 weeks  INTERVAL HISTORY:  Jacqueline Tyler is here today for repeat clinical assessment. She last saw Dr. Mosetta Putt 11/28/2023.  Was having nausea and diarrhea post cycle.  Had a 5 pound weight loss after first cycle along with mild anemia.  Was discussed with her potential dose reduction  if significant weight loss occurs over these past 2 weeks.  She did continue to have severe diarrhea and vomiting despite using imodium and prescribed antiemetics. She had additional 5 pound weight loss since last treatment. She feels very fatigued. Anemia is mild, persistent, and stable. Will postpone today's  treatment for 1 week and proceed with Cycle 2 day 1 with reduced dose liposomal irinotecan next Thursday. Consider changing to irinotecan if severe diarrhea continues after additional dose reduction. Home supportive measures were reviewed with her.  Today begin cycle 2 day 1 NALIRIFOX. She denies fevers or chills. She denies pain. Her appetite is good. Her weight has decreased 5 pounds over last 2 weeks . She has lost total of 10 pounds since starting treatment with NALIRIFOX on 11/13/2024.   I have reviewed the past medical history, past surgical history, social history and family history with the patient and they are unchanged from previous note.  ALLERGIES:  is allergic to tyloxapol, oxycodone-acetaminophen, and poison ivy extract.  MEDICATIONS:  Current Outpatient Medications  Medication Sig Dispense Refill   acetaminophen (TYLENOL) 325 MG tablet Take 650 mg by mouth every 6 (six) hours as needed for moderate pain (pain score 4-6).     busPIRone (BUSPAR) 5 MG tablet Take 5 mg by mouth daily.     citalopram (CELEXA) 40 MG tablet Take 40 mg by mouth every evening.     CREON 36000-114000 units CPEP capsule TAKE 2 CAPSULES BY MOUTH 3 TIMES A DAY WITH A MEAL. MAY ALSO TAKE 1 CAPSULE AS NEEDED WITH SNACKS 240 capsule 2   dexamethasone (DECADRON) 4 MG tablet Take 2 tablets (8 mg total) by mouth daily. Start the day after irinotecan chemotherapy for 2 days. Take with food. 8 tablet 5   hydrochlorothiazide (MICROZIDE) 12.5 MG capsule Take 12.5 mg by mouth in the morning.     lidocaine-prilocaine (EMLA) cream Apply to affected area once 30 g 3   magnesium oxide (MAG-OX) 400 (241.3 Mg) MG tablet Take 1 tablet (400 mg total) by mouth every evening.     mirtazapine (REMERON) 7.5 MG tablet Take 1 tablet (7.5 mg total) by mouth at bedtime. 30 tablet 1   ondansetron (ZOFRAN) 8 MG tablet Take 1 tablet (8 mg total) by mouth every 8 (eight) hours as needed for nausea or vomiting. Start on the third day after  irinotecan 30 tablet 1   oxyCODONE-acetaminophen (PERCOCET/ROXICET) 5-325 MG tablet Take 1 tablet by mouth every 6 (six) hours as needed for severe pain (pain score 7-10). 12 tablet 0   potassium chloride (KLOR-CON) 10 MEQ tablet TAKE 1 TABLET BY MOUTH EVERY DAY (Patient taking differently: Take 10 mEq by mouth 2 (two) times daily.) 90 tablet 1   prochlorperazine (COMPAZINE) 10 MG tablet Take 1 tablet (10 mg total) by mouth every 6 (six) hours as needed for nausea or vomiting. 30 tablet 1   simvastatin (ZOCOR) 80 MG tablet Take 80 mg by mouth every evening.     Current Facility-Administered Medications  Medication Dose Route Frequency Provider Last Rate Last Admin   sodium chloride flush (NS) 0.9 % injection 10 mL  10 mL Intracatheter PRN Malachy Mood, MD   10 mL at 12/12/23 1026    HISTORY OF PRESENT ILLNESS:   Oncology History Overview Note  Cancer Staging Pancreatic cancer [facility]) Staging form: Exocrine Pancreas, AJCC 8th Edition - Clinical stage from 08/24/2020: Stage IIB (cT2, cN1, cM0) - Signed by Malachy Mood, MD on 08/30/2020 Stage prefix: Initial diagnosis  Pancreatic cancer (HCC)  08/20/2020 Imaging   CT AP 08/20/20  IMPRESSION: 1. 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 Collateral circulation is noted. There is moderate intrahepatic and extrahepatic biliary dilatation which appears to be due to the pancreatic head mass. Pancreatic ductal dilatation is noted as well. 2. Probable 2.7 cm uterine fibroid. 3. Aortic atherosclerosis.   Aortic Atherosclerosis (ICD10-I70.0).   08/20/2020 Tumor Marker   Ca19-9 - 927   08/22/2020 Procedure   ERCP by Dr Ewing Schlein 08/22/20  IMPRESSION - The major papilla appeared normal. - A biliary sphincterotomy was performed. - Cells for cytology obtained in the lower  third and middle of the main duct. - One plastic stent was placed into the common bile duct.   FINAL MICROSCOPIC DIAGNOSIS:  - No malignant cells identified  - Benign reactive/reparative changes   08/24/2020 Cancer Staging   Staging form: Exocrine Pancreas, AJCC 8th Edition - Clinical stage from 08/24/2020: Stage IIB (cT2, cN1, cM0) - Signed by Malachy Mood, MD on 08/30/2020   08/24/2020 Procedure   EUS by Dr Dulce Sellar 08/24/20  IMPRESSION - There was no sign of significant pathology in the ampulla. - A few malignant-appearing lymph nodes were visualized in the peripancreatic region and porta hepatis region. - One stent was visualized endosonographically in the common bile duct. - A mass was identified in the pancreatic head. Tissue was obtained from this exam. The preliminary diagnosis is consistent with adenocarcinoma. Invasion into SMV/PV seen. Lymphadenopathy noted. This was staged T3 N1 Mx by endosonographic criteria. Fine needle aspiration performed.   08/24/2020 Initial Biopsy   FINAL MICROSCOPIC DIAGNOSIS: 08/24/20 - Malignant cells consistent with adenocarcinoma    08/30/2020 Initial Diagnosis   Pancreatic cancer (HCC)   09/05/2020 Imaging   CT Chest  IMPRESSION: 1. Stable 2.7 cm infiltrating pancreatic head mass with borderline enlarged peripancreatic lymph nodes. 2. No findings for pulmonary metastatic disease. 3. Small hiatal hernia. 4. Aortic atherosclerosis.   Aortic Atherosclerosis (ICD10-I70.0).   09/08/2020 Procedure   INSERTION PORT-A-CATH by Dr Freida Busman and Donell Beers    09/12/2020 - 12/29/2020 Chemotherapy   Neoadjuvant FOLFIRINOX q2 weeks starting 09/12/20-12/29/20    12/02/2020 Imaging   CT CAP  IMPRESSION: 1. Previously noted mass of the central pancreatic head is almost entirely resolved, difficult to discretely appreciate on current examination. Findings are consistent with treatment response. There remains obstruction of the pancreatic duct near the head  neck junction with mild prominence of the pancreatic duct, measuring up to 5 mm, with atrophy of the distal pancreatic parenchyma. 2. The portal vein, splenic vein, and superior mesenteric vein are now widely patent, previously effaced at the confluence by mass effect. 3. Interval placement of common bile duct stent, tip positioned in the distal duodenum, with relief of previously seen biliary ductal dilatation. 4. 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 Following the bifurcation of the celiac mesenteric trunk, the celiac axis appears to directly traverse the vicinity of the mass, although there does appear to be a fat plane about the vessel. 5. The distal small bowel and colon are diffusely somewhat hyperenhancing and inflamed appearing with vascular combing and a tethered appearance of the distal small  bowel. This appearance generally suggests inflammatory bowel disease such as Crohn's disease. Correlate with referable clinical history, if present. No evidence of obstruction or other acute complication. 6. Hepatic steatosis. 7. Aortic atherosclerosis.   12/28/2020 Genetic Testing   Negative hereditary cancer genetic testing: no pathogenic variants detected in [facility] Common Hereditary Cancers Panel.  The report date is December 28, 2020.    The Common Hereditary Cancers Panel offered by [facility] includes sequencing and/or deletion duplication testing of the following 47 genes: APC, ATM, AXIN2, BARD1, BMPR1A, BRCA1, BRCA2, BRIP1, CDH1, CDK4, CDKN2A (p14ARF), CDKN2A (p16INK4a), CHEK2, CTNNA1, DICER1, EPCAM (Deletion/duplication testing only), GREM1 (promoter region deletion/duplication testing only), GREM1, HOXB13, KIT, MEN1, MLH1, MSH2, MSH3, MSH6, MUTYH, NBN, NF1, NHTL1, PALB2, PDGFRA, PMS2, POLD1, POLE, PTEN, RAD50, RAD51C,  RAD51D, SDHA, SDHB, SDHC, SDHD, SMAD4, SMARCA4. STK11, TP53, TSC1, TSC2, and VHL.  The following genes were evaluated for sequence changes only: SDHA and HOXB13 c.251G>A variant only.   01/17/2021 Imaging   CT CAP [facility] IMPRESSION Small lesion in the pancreatic head measuring approximately 1.1 x 0.8 cm without evidence of vascular involvement or metastasis consistent with previously described, biopsy-proven pancreatic adenocarcinoma.   02/08/2021 Surgery   Whipple Surgery with Dr Deveron Furlong  Final Pathologic Diagnosis      A.  GALLBLADDER, CHOLECYSTECTOMY: Chronic cholecystitis. No malignancy identified.   B.  BILE DUCT STENT, REMOVAL (GROSS ONLY DIAGNOSIS): Stent, see gross description.   C.  WHIPPLE RESECTION: No residual malignancy identified. Chronic and acute inflammation with granulation tissue reaction, fibrosis and features of chronic pancreatitis, suggestive of therapy effect. Fourteen lymph nodes, negative for metastasis (0/14). Margins negative for malignancy.        06/07/2021 Imaging   CT AP  IMPRESSION: 1. No current findings of recurrent malignancy. Interval Whipple procedure with expected postoperative findings. 2. A 3.5 cm stent is present in the dorsal pancreatic duct. No duct dilatation. There is an approximately 1.5 mm lucency centrally along this stent shown on image 43 series 7, possibilities include stent fracture, two separate stents, or an intentional radial lucency in this type of stent. 3. Small type 1 hiatal hernia. Mild distal esophageal wall thickening may reflect low-grade esophagitis. 4.  Aortic Atherosclerosis (ICD10-I70.0). 5.  Prominent stool throughout the colon favors constipation. 6. Uterine fibroid. 7. 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   11/14/2023 -  Chemotherapy   Patient is on Treatment Plan : PANCREAS NALIRIFOX D1, 15 Q28D     Port-A-Cath in place      REVIEW OF  SYSTEMS:   Constitutional: Denies fevers or chills. Has reduced appetite. 5 pound weight loss since last visit. Feels very fatigued.  Eyes: Denies blurriness of vision Ears, nose, mouth, throat, and face: Denies mucositis or sore throat Respiratory: Denies cough, dyspnea or wheezes Cardiovascular: Denies palpitation, chest discomfort or lower extremity swelling Gastrointestinal:  has had severe diarrhea and vomiting since last treatment 2 weeks ago Skin: Denies abnormal skin rashes Lymphatics: Denies new lymphadenopathy or easy bruising Neurological:Denies numbness, tingling or new weaknesses Behavioral/Psych: Mood is stable, no new changes  All other systems were reviewed with the patient and are negative.   VITALS:   Today's Vitals   12/12/23 0929 12/12/23 0930  BP: 115/72   Pulse: 98   Resp: 16   Temp: 98 F (36.7 C)   TempSrc: Temporal   SpO2: 100%   Weight: 121 lb 12.8 oz (55.2 kg)   PainSc:  0-No pain  Body mass index is 23.79 kg/m.   Wt Readings from Last 3 Encounters:  12/12/23 121 lb 12.8 oz (55.2 kg)  11/28/23 126 lb 14.4 oz (57.6 kg)  11/14/23 131 lb 9.6 oz (59.7 kg)    Body mass index is 23.79 kg/m.  Performance status (ECOG): 2 - Symptomatic, <50% confined to bed  PHYSICAL EXAM:   GENERAL:alert, no distress and comfortable. Appears fatigued.  SKIN: skin color, texture, turgor are normal, no rashes or significant lesions EYES: normal, Conjunctiva are pink and non-injected, sclera clear OROPHARYNX:no exudate, no erythema and lips, buccal mucosa, and tongue normal  NECK: supple, thyroid normal size, non-tender, without nodularity LYMPH:  no palpable lymphadenopathy in the cervical, axillary or inguinal LUNGS: clear to auscultation and percussion with normal breathing effort HEART: regular rate & rhythm and no murmurs and no lower extremity edema ABDOMEN:abdomen soft, non-tender and normal bowel sounds Musculoskeletal:no cyanosis of digits and no  clubbing  NEURO: alert & oriented x 3 with fluent speech, no focal motor/sensory deficits  LABORATORY DATA:  I have reviewed the data as listed    Component Value Date/Time   NA 134 (L) 12/12/2023 0904   K 3.1 (L) 12/12/2023 0904   CL 96 (L) 12/12/2023 0904   CO2 27 12/12/2023 0904   GLUCOSE 138 (H) 12/12/2023 0904   BUN 12 12/12/2023 0904   CREATININE 0.87 12/12/2023 0904   CALCIUM 8.1 (L) 12/12/2023 0904   PROT 5.4 (L) 12/12/2023 0904   ALBUMIN 3.0 (L) 12/12/2023 0904   AST 13 (L) 12/12/2023 0904   ALT 10 12/12/2023 0904   ALKPHOS 102 12/12/2023 0904   BILITOT 0.5 12/12/2023 0904   GFRNONAA >60 12/12/2023 0904   GFRAA >60 08/23/2020 0606    Lab Results  Component Value Date   WBC 6.9 12/12/2023   NEUTROABS 4.9 12/12/2023   HGB 11.3 (L) 12/12/2023   HCT 33.6 (L) 12/12/2023   MCV 83.8 12/12/2023   PLT 324 12/12/2023    Addendum I have seen the patient, examined her. I agree with the assessment and and plan and have edited the notes.   Miyana has lost 10 pounds since last cycle chemotherapy, had a significant diarrhea, which finally improved a few days ago.  She is still very fatigued, we will postpone her chemotherapy for week.  We discussed symptom management, and dose reduction on next cycle.  All questions were answered, I spent a total of 25 minutes for her visit today, more than 50% time on face-to-face counseling.  Malachy Mood MD 12/12/2023

## 2023-12-12 ENCOUNTER — Inpatient Hospital Stay: Payer: BC Managed Care – PPO

## 2023-12-12 ENCOUNTER — Inpatient Hospital Stay (HOSPITAL_BASED_OUTPATIENT_CLINIC_OR_DEPARTMENT_OTHER): Payer: BC Managed Care – PPO | Admitting: Nurse Practitioner

## 2023-12-12 ENCOUNTER — Telehealth: Payer: Self-pay | Admitting: Hematology

## 2023-12-12 VITALS — BP 115/72 | HR 98 | Temp 98.0°F | Resp 16 | Wt 121.8 lb

## 2023-12-12 DIAGNOSIS — C250 Malignant neoplasm of head of pancreas: Secondary | ICD-10-CM | POA: Diagnosis not present

## 2023-12-12 DIAGNOSIS — Z95828 Presence of other vascular implants and grafts: Secondary | ICD-10-CM

## 2023-12-12 LAB — CBC WITH DIFFERENTIAL (CANCER CENTER ONLY)
Abs Immature Granulocytes: 0.29 10*3/uL — ABNORMAL HIGH (ref 0.00–0.07)
Basophils Absolute: 0.1 10*3/uL (ref 0.0–0.1)
Basophils Relative: 1 %
Eosinophils Absolute: 0 10*3/uL (ref 0.0–0.5)
Eosinophils Relative: 1 %
HCT: 33.6 % — ABNORMAL LOW (ref 36.0–46.0)
Hemoglobin: 11.3 g/dL — ABNORMAL LOW (ref 12.0–15.0)
Immature Granulocytes: 4 %
Lymphocytes Relative: 13 %
Lymphs Abs: 0.9 10*3/uL (ref 0.7–4.0)
MCH: 28.2 pg (ref 26.0–34.0)
MCHC: 33.6 g/dL (ref 30.0–36.0)
MCV: 83.8 fL (ref 80.0–100.0)
Monocytes Absolute: 0.8 10*3/uL (ref 0.1–1.0)
Monocytes Relative: 11 %
Neutro Abs: 4.9 10*3/uL (ref 1.7–7.7)
Neutrophils Relative %: 70 %
Platelet Count: 324 10*3/uL (ref 150–400)
RBC: 4.01 MIL/uL (ref 3.87–5.11)
RDW: 13.6 % (ref 11.5–15.5)
Smear Review: NORMAL
WBC Count: 6.9 10*3/uL (ref 4.0–10.5)
nRBC: 0 % (ref 0.0–0.2)

## 2023-12-12 LAB — CMP (CANCER CENTER ONLY)
ALT: 10 U/L (ref 0–44)
AST: 13 U/L — ABNORMAL LOW (ref 15–41)
Albumin: 3 g/dL — ABNORMAL LOW (ref 3.5–5.0)
Alkaline Phosphatase: 102 U/L (ref 38–126)
Anion gap: 11 (ref 5–15)
BUN: 12 mg/dL (ref 8–23)
CO2: 27 mmol/L (ref 22–32)
Calcium: 8.1 mg/dL — ABNORMAL LOW (ref 8.9–10.3)
Chloride: 96 mmol/L — ABNORMAL LOW (ref 98–111)
Creatinine: 0.87 mg/dL (ref 0.44–1.00)
GFR, Estimated: >60 mL/min (ref >60)
Glucose, Bld: 138 mg/dL — ABNORMAL HIGH (ref 70–99)
Potassium: 3.1 mmol/L — ABNORMAL LOW (ref 3.5–5.1)
Sodium: 134 mmol/L — ABNORMAL LOW (ref 135–145)
Total Bilirubin: 0.5 mg/dL (ref 0.0–1.2)
Total Protein: 5.4 g/dL — ABNORMAL LOW (ref 6.5–8.1)

## 2023-12-12 MED ORDER — SODIUM CHLORIDE 0.9% FLUSH
10.0000 mL | INTRAVENOUS | Status: DC | PRN
Start: 2023-12-12 — End: 2023-12-13
  Administered 2023-12-12: 10 mL

## 2023-12-12 MED ORDER — HEPARIN SOD (PORK) LOCK FLUSH 100 UNIT/ML IV SOLN
500.0000 [IU] | Freq: Once | INTRAVENOUS | Status: AC | PRN
  Administered 2023-12-12: 500 [IU]

## 2023-12-12 MED ORDER — MIRTAZAPINE 7.5 MG PO TABS: 7.5000 mg | ORAL_TABLET | Freq: Every day | ORAL | 1 refills | Status: DC

## 2023-12-12 MED ORDER — SODIUM CHLORIDE 0.9% FLUSH
10.0000 mL | INTRAVENOUS | Status: DC | PRN
  Administered 2023-12-12: 10 mL

## 2023-12-12 NOTE — Telephone Encounter (Signed)
Per Dr Mosetta Putt, Pt de accessed Treatment canceled for today.

## 2023-12-13 ENCOUNTER — Encounter: Payer: Self-pay | Admitting: Nurse Practitioner

## 2023-12-13 ENCOUNTER — Encounter: Payer: Self-pay | Admitting: Hematology

## 2023-12-14 ENCOUNTER — Inpatient Hospital Stay: Payer: BC Managed Care – PPO

## 2023-12-17 ENCOUNTER — Inpatient Hospital Stay: Payer: BC Managed Care – PPO

## 2023-12-17 ENCOUNTER — Other Ambulatory Visit: Payer: Self-pay

## 2023-12-24 ENCOUNTER — Telehealth: Payer: BC Managed Care – PPO | Admitting: Nurse Practitioner

## 2023-12-25 NOTE — Assessment & Plan Note (Addendum)
 stage IIB, [name], [name], [name] -Diagnosed 08/24/20 on EUS with Dr Dulce Sellar, which showed mass at head of pancreas, s/p stenting, with SMV and PV invasion, biopsy confirmed adenocarcinoma with few malignant-appearing LNs in peripancreatic and porta hepatis region. Staging was negative for metastatic disease.  -she completed neoadjuvant chemo FOLFIRINOX q2 weeks 09/12/20 - 12/29/20. -s/p whipple surgery by Dr Flonnie Hailstone on 02/08/21, path showed no residual disease.  -due to the high recurrence risk of pancreatic cancer, she is under close surveillance.  -Her PET scan from September 30, 2023 showed hypermetabolic soft tissue mass at the previous Whipple surgical site, and a small mildly hypermetabolic nodule in the left lung, concerning for metastatic recurrence. -lung biopsy 10/28/2023 confirmed adenocarcinoma, IHC consistent with metastatic pancreatic ca.  -she started chemo NALIRIFOX  on 11/14/23, had a significant diarrhea, required dose reduction.

## 2023-12-26 ENCOUNTER — Inpatient Hospital Stay: Payer: BC Managed Care – PPO | Admitting: Licensed Clinical Social Worker

## 2023-12-26 ENCOUNTER — Other Ambulatory Visit: Payer: Self-pay

## 2023-12-26 ENCOUNTER — Encounter: Payer: Self-pay | Admitting: Hematology

## 2023-12-26 ENCOUNTER — Inpatient Hospital Stay: Payer: Self-pay

## 2023-12-26 ENCOUNTER — Inpatient Hospital Stay (HOSPITAL_BASED_OUTPATIENT_CLINIC_OR_DEPARTMENT_OTHER): Payer: BC Managed Care – PPO | Admitting: Hematology

## 2023-12-26 ENCOUNTER — Inpatient Hospital Stay: Payer: BC Managed Care – PPO | Attending: Hematology

## 2023-12-26 VITALS — BP 140/82 | HR 82 | Temp 98.0°F | Resp 16 | Wt 122.3 lb

## 2023-12-26 DIAGNOSIS — C250 Malignant neoplasm of head of pancreas: Secondary | ICD-10-CM | POA: Insufficient documentation

## 2023-12-26 DIAGNOSIS — Z5111 Encounter for antineoplastic chemotherapy: Secondary | ICD-10-CM | POA: Diagnosis present

## 2023-12-26 DIAGNOSIS — I10 Essential (primary) hypertension: Secondary | ICD-10-CM | POA: Insufficient documentation

## 2023-12-26 DIAGNOSIS — E876 Hypokalemia: Secondary | ICD-10-CM | POA: Insufficient documentation

## 2023-12-26 DIAGNOSIS — Z79899 Other long term (current) drug therapy: Secondary | ICD-10-CM | POA: Diagnosis not present

## 2023-12-26 DIAGNOSIS — Z95828 Presence of other vascular implants and grafts: Secondary | ICD-10-CM

## 2023-12-26 DIAGNOSIS — Z90411 Acquired partial absence of pancreas: Secondary | ICD-10-CM | POA: Insufficient documentation

## 2023-12-26 LAB — CMP (CANCER CENTER ONLY)
ALT: 224 U/L — ABNORMAL HIGH (ref 0–44)
AST: 58 U/L — ABNORMAL HIGH (ref 15–41)
Albumin: 3 g/dL — ABNORMAL LOW (ref 3.5–5.0)
Alkaline Phosphatase: 248 U/L — ABNORMAL HIGH (ref 38–126)
Anion gap: 5 (ref 5–15)
BUN: 8 mg/dL (ref 8–23)
CO2: 32 mmol/L (ref 22–32)
Calcium: 8 mg/dL — ABNORMAL LOW (ref 8.9–10.3)
Chloride: 104 mmol/L (ref 98–111)
Creatinine: 0.83 mg/dL (ref 0.44–1.00)
GFR, Estimated: >60 mL/min (ref >60)
Glucose, Bld: 128 mg/dL — ABNORMAL HIGH (ref 70–99)
Potassium: 2.9 mmol/L — ABNORMAL LOW (ref 3.5–5.1)
Sodium: 141 mmol/L (ref 135–145)
Total Bilirubin: 1 mg/dL (ref 0.0–1.2)
Total Protein: 5.3 g/dL — ABNORMAL LOW (ref 6.5–8.1)

## 2023-12-26 LAB — CBC WITH DIFFERENTIAL (CANCER CENTER ONLY)
Abs Immature Granulocytes: 0.16 10*3/uL — ABNORMAL HIGH (ref 0.00–0.07)
Basophils Absolute: 0.1 10*3/uL (ref 0.0–0.1)
Basophils Relative: 1 %
Eosinophils Absolute: 0.1 10*3/uL (ref 0.0–0.5)
Eosinophils Relative: 1 %
HCT: 34.2 % — ABNORMAL LOW (ref 36.0–46.0)
Hemoglobin: 11.5 g/dL — ABNORMAL LOW (ref 12.0–15.0)
Immature Granulocytes: 2 %
Lymphocytes Relative: 18 %
Lymphs Abs: 1.8 10*3/uL (ref 0.7–4.0)
MCH: 28.5 pg (ref 26.0–34.0)
MCHC: 33.6 g/dL (ref 30.0–36.0)
MCV: 84.9 fL (ref 80.0–100.0)
Monocytes Absolute: 1 10*3/uL (ref 0.1–1.0)
Monocytes Relative: 10 %
Neutro Abs: 6.7 10*3/uL (ref 1.7–7.7)
Neutrophils Relative %: 68 %
Platelet Count: 330 10*3/uL (ref 150–400)
RBC: 4.03 MIL/uL (ref 3.87–5.11)
RDW: 16.4 % — ABNORMAL HIGH (ref 11.5–15.5)
WBC Count: 9.9 10*3/uL (ref 4.0–10.5)
nRBC: 0 % (ref 0.0–0.2)

## 2023-12-26 MED ORDER — SODIUM CHLORIDE 0.9% FLUSH
10.0000 mL | INTRAVENOUS | Status: DC | PRN
  Administered 2023-12-26: 10 mL

## 2023-12-26 MED ORDER — SODIUM CHLORIDE 0.9 % IV SOLN
2400.0000 mg/m2 | INTRAVENOUS | Status: DC
  Administered 2023-12-26: 3900 mg via INTRAVENOUS
  Filled 2023-12-26: qty 78

## 2023-12-26 MED ORDER — ATROPINE SULFATE 1 MG/ML IV SOLN
0.5000 mg | Freq: Once | INTRAVENOUS | Status: AC | PRN
  Administered 2023-12-26: 0.5 mg via INTRAVENOUS
  Filled 2023-12-26: qty 1

## 2023-12-26 MED ORDER — DEXAMETHASONE SODIUM PHOSPHATE 10 MG/ML IJ SOLN
10.0000 mg | Freq: Once | INTRAMUSCULAR | Status: AC
  Administered 2023-12-26: 10 mg via INTRAVENOUS
  Filled 2023-12-26: qty 1

## 2023-12-26 MED ORDER — DEXTROSE 5 % IV SOLN: INTRAVENOUS | Status: DC

## 2023-12-26 MED ORDER — FAMOTIDINE IN NACL 20-0.9 MG/50ML-% IV SOLN
20.0000 mg | Freq: Once | INTRAVENOUS | Status: AC
  Administered 2023-12-26: 20 mg via INTRAVENOUS
  Filled 2023-12-26: qty 50

## 2023-12-26 MED ORDER — SODIUM CHLORIDE 0.9% FLUSH: 10.0000 mL | INTRAVENOUS | Status: DC | PRN

## 2023-12-26 MED ORDER — LORATADINE 10 MG PO TABS
10.0000 mg | ORAL_TABLET | Freq: Once | ORAL | Status: AC
  Administered 2023-12-26: 10 mg via ORAL
  Filled 2023-12-26: qty 1

## 2023-12-26 MED ORDER — OXALIPLATIN CHEMO INJECTION 100 MG/20ML
50.0000 mg/m2 | Freq: Once | INTRAVENOUS | Status: AC
  Administered 2023-12-26: 80 mg via INTRAVENOUS
  Filled 2023-12-26: qty 16

## 2023-12-26 MED ORDER — LEUCOVORIN CALCIUM INJECTION 350 MG
400.0000 mg/m2 | Freq: Once | INTRAVENOUS | Status: AC
  Administered 2023-12-26: 648 mg via INTRAVENOUS
  Filled 2023-12-26: qty 32.4

## 2023-12-26 MED ORDER — HEPARIN SOD (PORK) LOCK FLUSH 100 UNIT/ML IV SOLN: 500.0000 [IU] | Freq: Once | INTRAVENOUS | Status: DC | PRN

## 2023-12-26 MED ORDER — PALONOSETRON HCL INJECTION 0.25 MG/5ML
0.2500 mg | Freq: Once | INTRAVENOUS | Status: AC
  Administered 2023-12-26: 0.25 mg via INTRAVENOUS
  Filled 2023-12-26: qty 5

## 2023-12-26 MED ORDER — SODIUM CHLORIDE 0.9 % IV SOLN
35.0000 mg/m2 | Freq: Once | INTRAVENOUS | Status: AC
  Administered 2023-12-26: 55.9 mg via INTRAVENOUS
  Filled 2023-12-26: qty 13

## 2023-12-26 NOTE — Progress Notes (Signed)
 [facility]   [phone] [phone]   Clinic Follow up Note   Patient Care Team: [name], DO as PCP - General (Internal Medicine) Lanny Callander, MD as Consulting Physician (Oncology) Dasie Leonor CROME, MD as Consulting Physician (General Surgery)  Date of Service:  12/26/2023  CHIEF COMPLAINT: f/u of pancreatic cancer  CURRENT THERAPY:  NALIRIFOX EVERY 2 WEEKS   Oncology History   Pancreatic cancer (HCC) stage IIB, [name], [name], [name] -Diagnosed 08/24/20 on EUS with Dr Burnette, which showed mass at head of pancreas, s/p stenting, with SMV and PV invasion, biopsy confirmed adenocarcinoma with few malignant-appearing LNs in peripancreatic and porta hepatis region. Staging was negative for metastatic disease.  -she completed neoadjuvant chemo FOLFIRINOX q2 weeks 09/12/20 - 12/29/20. -s/p whipple surgery by Dr Debora on 02/08/21, path showed no residual disease.  -due to the high recurrence risk of pancreatic cancer, she is under close surveillance.  -Her PET scan from September 30, 2023 showed hypermetabolic soft tissue mass at the previous Whipple surgical site, and a small mildly hypermetabolic nodule in the left lung, concerning for metastatic recurrence. -lung biopsy 10/28/2023 confirmed adenocarcinoma, IHC consistent with metastatic pancreatic ca.  -she started chemo NALIRIFOX  on 11/14/23, had a significant diarrhea, required dose reduction.    Assessment and Plan    Metastatic Pancreatic Cancer Stage IV pancreatic cancer with lung metastasis confirmed by biopsy. Prognosis is poor with low chance of cure.  Patient was not aware that she has stage IV disease, I reviewed the scan , biopsy with her and her husband again and discussed the overall incurable nature of her disease.  We did discuss the role of consolidative radiation if she has good response to chemo, to allow her to take a chemo break  in the future -currently on reduced-dose chemotherapy due to side effects (diarrhea, nausea). Treatment aims to control cancer and improve quality of life. Radiation therapy may be considered if chemotherapy response is favorable. - Continue reduced-dose irinotecan  chemotherapy - Monitor for diarrhea and nausea - Postpone CT scan to mid-March to evaluate efficacy - Schedule follow-up scans every three months - Consider radiation therapy if chemotherapy response is favorable - Refer to radiation oncology if beneficial - Provide supportive care and counseling through social worker  Nutritional Support Maintaining weight at 122 lbs with improved appetite and intake. Following a high-protein, high-carbohydrate diet. On Creon  and metyrapone; metyrapone causing vivid dreams. 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 Advised to apply for long-term disability and consider early Social Security benefits. Encouraged to seek social worker support and enroll in Medicare later this year. - Apply for long-term disability - Consider early Social Security benefits - Buyer, retail for emotional and social support - Ensure Medicare enrollment later this year  Plan -Lab reviewed, adequate for treatment, will proceed dose reduced chemotherapy today - Schedule next chemotherapy session in two weeks - Reschedule CT scan to mid-March - Follow-up appointments on February 20 and March 6 - Monitor treatment response and adjust regimen as needed.  -Refer her to social worker Devere for counseling and social benefit discussion      SUMMARY OF ONCOLOGIC HISTORY: Oncology History Overview Note  Cancer Staging Pancreatic cancer [facility]) Staging form: Exocrine Pancreas,  AJCC 8th Edition -  Clinical stage from 08/24/2020: Stage IIB (cT2, cN1, cM0) - Signed by Lanny Callander, MD on 08/30/2020 Stage prefix: Initial diagnosis    Pancreatic cancer (HCC)  08/20/2020 Imaging   CT AP 08/20/20  IMPRESSION: 1. 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 Collateral circulation is noted. There is moderate intrahepatic and extrahepatic biliary dilatation which appears to be due to the pancreatic head mass. Pancreatic ductal dilatation is noted as well. 2. Probable 2.7 cm uterine fibroid. 3. Aortic atherosclerosis.   Aortic Atherosclerosis (ICD10-I70.0).   08/20/2020 Tumor Marker   Ca19-9 - 927   08/22/2020 Procedure   ERCP by Dr Rosalie 08/22/20  IMPRESSION - The major papilla appeared normal. - A biliary sphincterotomy was performed. - Cells for cytology obtained in the lower third and middle of the main duct. - One plastic stent was placed into the common bile duct.   FINAL MICROSCOPIC DIAGNOSIS:  - No malignant cells identified  - Benign reactive/reparative changes   08/24/2020 Cancer Staging   Staging form: Exocrine Pancreas, AJCC 8th Edition - Clinical stage from 08/24/2020: Stage IIB (cT2, cN1, cM0) - Signed by Lanny Callander, MD on 08/30/2020   08/24/2020 Procedure   EUS by Dr burnette 08/24/20  IMPRESSION - There was no sign of significant pathology in the ampulla. - A few malignant-appearing lymph nodes were visualized in the peripancreatic region and porta hepatis region. - One stent was visualized endosonographically in the common bile duct. - A mass was identified in the pancreatic head. Tissue was obtained from this exam. The preliminary diagnosis is consistent with adenocarcinoma. Invasion into SMV/PV seen. Lymphadenopathy noted. This was staged T3 N1 Mx by endosonographic  criteria. Fine needle aspiration performed.   08/24/2020 Initial Biopsy   FINAL MICROSCOPIC DIAGNOSIS: 08/24/20 - Malignant cells consistent with adenocarcinoma    08/30/2020 Initial Diagnosis   Pancreatic cancer (HCC)   09/05/2020 Imaging   CT Chest  IMPRESSION: 1. Stable 2.7 cm infiltrating pancreatic head mass with borderline enlarged peripancreatic lymph nodes. 2. No findings for pulmonary metastatic disease. 3. Small hiatal hernia. 4. Aortic atherosclerosis.   Aortic Atherosclerosis (ICD10-I70.0).   09/08/2020 Procedure   INSERTION PORT-A-CATH by Dr Dasie and Aron    09/12/2020 - 12/29/2020 Chemotherapy   Neoadjuvant FOLFIRINOX q2 weeks starting 09/12/20-12/29/20    12/02/2020 Imaging   CT CAP  IMPRESSION: 1. Previously noted mass of the central pancreatic head is almost entirely resolved, difficult to discretely appreciate on current examination. Findings are consistent with treatment response. There remains obstruction of the pancreatic duct near the head neck junction with mild prominence of the pancreatic duct, measuring up to 5 mm, with atrophy of the distal pancreatic parenchyma. 2. The portal vein, splenic vein, and superior mesenteric vein are now widely patent, previously effaced at the confluence by mass effect. 3. Interval placement of common bile duct stent, tip positioned in the distal duodenum, with relief of previously seen biliary ductal dilatation. 4. 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 Following the bifurcation of the celiac mesenteric trunk, the celiac axis appears to directly traverse the vicinity of the mass, although there does appear to be a fat plane about the vessel. 5. The  distal small bowel and colon are diffusely somewhat hyperenhancing and inflamed appearing with  vascular combing and a tethered appearance of the distal small bowel. This appearance generally suggests inflammatory bowel disease such as Crohn's disease. Correlate with referable clinical history, if present. No evidence of obstruction or other acute complication. 6. Hepatic steatosis. 7. Aortic atherosclerosis.   12/28/2020 Genetic Testing   Negative hereditary cancer genetic testing: no pathogenic variants detected in [facility] Common Hereditary Cancers Panel.  The report date is December 28, 2020.    The Common Hereditary Cancers Panel offered by [facility] includes sequencing and/or deletion duplication testing of the following 47 genes: APC, ATM, AXIN2, BARD1, BMPR1A, BRCA1, BRCA2, BRIP1, CDH1, CDK4, CDKN2A (p14ARF), CDKN2A (p16INK4a), CHEK2, CTNNA1, DICER1, EPCAM (Deletion/duplication testing only), GREM1 (promoter region deletion/duplication testing only), GREM1, HOXB13, KIT, MEN1, MLH1, MSH2, MSH3, MSH6, MUTYH, NBN, NF1, NHTL1, PALB2, PDGFRA, PMS2, POLD1, POLE, PTEN, RAD50, RAD51C, RAD51D, SDHA, SDHB, SDHC, SDHD, SMAD4, SMARCA4. STK11, TP53, TSC1, TSC2, and VHL.  The following genes were evaluated for sequence changes only: SDHA and HOXB13 c.251G>A variant only.   01/17/2021 Imaging   CT CAP from [facility] IMPRESSION Small lesion in the pancreatic head measuring approximately 1.1 x 0.8 cm without evidence of vascular involvement or metastasis consistent with previously described, biopsy-proven pancreatic adenocarcinoma.   02/08/2021 Surgery   Whipple Surgery with Dr Abran Kallman  Final Pathologic Diagnosis      A.  GALLBLADDER, CHOLECYSTECTOMY: Chronic cholecystitis. No malignancy identified.   B.  BILE DUCT STENT, REMOVAL (GROSS ONLY DIAGNOSIS): Stent, see gross description.   C.  WHIPPLE RESECTION: No residual malignancy identified. Chronic and acute inflammation with granulation tissue reaction, fibrosis and features of chronic pancreatitis, suggestive of therapy effect. Fourteen lymph  nodes, negative for metastasis (0/14). Margins negative for malignancy.        06/07/2021 Imaging   CT AP  IMPRESSION: 1. No current findings of recurrent malignancy. Interval Whipple procedure with expected postoperative findings. 2. A 3.5 cm stent is present in the dorsal pancreatic duct. No duct dilatation. There is an approximately 1.5 mm lucency centrally along this stent shown on image 43 series 7, possibilities include stent fracture, two separate stents, or an intentional radial lucency in this type of stent. 3. Small type 1 hiatal hernia. Mild distal esophageal wall thickening may reflect low-grade esophagitis. 4.  Aortic Atherosclerosis (ICD10-I70.0). 5.  Prominent stool throughout the colon favors constipation. 6. Uterine fibroid. 7. 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   11/14/2023 -  Chemotherapy   Patient is on Treatment Plan : PANCREAS NALIRIFOX D1, 15 Q28D     Port-A-Cath in place     Discussed the use of AI scribe software for clinical note transcription with the patient, who gave verbal consent to proceed.  History of Present Illness   The patient, a 63 year old with a history of pancreatic cancer, presents for a follow-up visit. She reports feeling better and has been able to maintain her weight at 122 pounds. She has been adhering to a high protein, high carbohydrate diet to maintain her weight. She reports that her diarrhea has resolved. She has not experienced any vomiting since her last visit.  The patient is currently on a lower dose chemotherapy regimen. She has missed one treatment session but is willing to continue with the lower dose regimen.  The patient also reports that she is not currently working and is on short-term disability. She is concerned about her insurance situation and  is not sure if she can return to work while undergoing chemotherapy. She has long-term disability insurance and is  considering applying for it.  The patient is also taking metyrapone, which has helped her appetite but has caused vivid dreams. She is also taking Creon .         All other systems were reviewed with the patient and are negative.  MEDICAL HISTORY:  Past Medical History:  Diagnosis Date   Anemia    Anxiety    Cancer (HCC) 07/2020   pancreatic cancer   Depression    Diabetes mellitus without complication (HCC) 07/2020   pancreatic cancer   High cholesterol    History of blood transfusion    History of hiatal hernia    small   Hypertension    Stroke [facility])    age 29, no residual effect    SURGICAL HISTORY: Past Surgical History:  Procedure Laterality Date   BILIARY BRUSHING  08/22/2020   Procedure: BILIARY BRUSHING;  Surgeon: Rosalie Kitchens, MD;  Location: [facility] ENDOSCOPY;  Service: Endoscopy;;   BILIARY STENT PLACEMENT N/A 08/22/2020   Procedure: BILIARY STENT PLACEMENT;  Surgeon: Rosalie Kitchens, MD;  Location: [facility] ENDOSCOPY;  Service: Endoscopy;  Laterality: N/A;   BRONCHIAL BIOPSY  10/28/2023   Procedure: BRONCHIAL BIOPSIES;  Surgeon: Shelah Lamar RAMAN, MD;  Location[facility] ENDOSCOPY;  Service: Pulmonary;;   BRONCHIAL BRUSHINGS  10/28/2023   Procedure: BRONCHIAL BRUSHINGS;  Surgeon: Shelah Lamar RAMAN, MD;  Location: [facility] ENDOSCOPY;  Service: Pulmonary;;   BRONCHIAL NEEDLE ASPIRATION BIOPSY  10/28/2023   Procedure: BRONCHIAL NEEDLE ASPIRATION BIOPSIES;  Surgeon: Shelah Lamar RAMAN, MD;  Location: [facility] ENDOSCOPY;  Service: Pulmonary;;   ENDOSCOPIC RETROGRADE CHOLANGIOPANCREATOGRAPHY (ERCP) WITH PROPOFOL  N/A 08/22/2020   Procedure: ENDOSCOPIC RETROGRADE CHOLANGIOPANCREATOGRAPHY (ERCP) WITH PROPOFOL ;  Surgeon: Rosalie Kitchens, MD;  Location: [facility] ENDOSCOPY;  Service: Endoscopy;  Laterality: N/A;   ESOPHAGOGASTRODUODENOSCOPY (EGD) WITH PROPOFOL  N/A 08/24/2020   Procedure: ESOPHAGOGASTRODUODENOSCOPY (EGD) WITH PROPOFOL ;  Surgeon: Burnette Fallow, MD;  Location: [facility] ENDOSCOPY;  Service: Endoscopy;  Laterality: N/A;    FIDUCIAL MARKER PLACEMENT  10/28/2023   Procedure: FIDUCIAL MARKER PLACEMENT;  Surgeon: Shelah Lamar RAMAN, MD;  Location: [facility] ENDOSCOPY;  Service: Pulmonary;;   FINE NEEDLE ASPIRATION N/A 08/24/2020   Procedure: FINE NEEDLE ASPIRATION (FNA) LINEAR;  Surgeon: Burnette Fallow, MD;  Location: [facility] ENDOSCOPY;  Service: Endoscopy;  Laterality: N/A;   PORT-A-CATH REMOVAL N/A 12/08/2021   Procedure: REMOVAL PORT-A-CATH;  Surgeon: Dasie Leonor CROME, MD;  Location[facility];  Service: General;  Laterality: N/A;   PORTACATH PLACEMENT Right 09/08/2020   Procedure: INSERTION PORT-A-CATH;  Surgeon: Dasie Leonor CROME, MD;  Location: [facility];  Service: General;  Laterality: Right;   PORTACATH PLACEMENT N/A 11/06/2023   Procedure: INSERTION PORT-A-CATH;  Surgeon: Dasie Leonor CROME, MD;  Location: [facility] ORS;  Service: General;  Laterality: N/A;  LMA   SPHINCTEROTOMY  08/22/2020   Procedure: ANNETT;  Surgeon: Rosalie Kitchens, MD;  Location: [facility] ENDOSCOPY;  Service: Endoscopy;;   UPPER ESOPHAGEAL ENDOSCOPIC ULTRASOUND (EUS) N/A 08/24/2020   Procedure: UPPER ESOPHAGEAL ENDOSCOPIC ULTRASOUND (EUS);  Surgeon: Burnette Fallow, MD;  Location: [facility];  Service: Endoscopy;  Laterality: N/A;    I have reviewed the social history and family history with the patient and they are unchanged from previous note.  ALLERGIES:  is allergic to tyloxapol, oxycodone -acetaminophen , and poison ivy extract.  MEDICATIONS:  Current Outpatient Medications  Medication Sig Dispense Refill   acetaminophen  (TYLENOL ) 325 MG tablet Take 650 mg by  mouth every 6 (six) hours as needed for moderate pain (pain score 4-6).     busPIRone (BUSPAR) 5 MG tablet Take 5 mg by mouth daily.     citalopram  (CELEXA ) 40 MG tablet Take 40 mg by mouth every evening.     CREON  36000-114000 units CPEP capsule TAKE 2 CAPSULES BY MOUTH 3 TIMES A DAY WITH A MEAL. MAY ALSO TAKE 1 CAPSULE AS NEEDED WITH SNACKS 240 capsule 2   dexamethasone  (DECADRON ) 4 MG  tablet Take 2 tablets (8 mg total) by mouth daily. Start the day after irinotecan  chemotherapy for 2 days. Take with food. 8 tablet 5   hydrochlorothiazide  (MICROZIDE ) 12.5 MG capsule Take 12.5 mg by mouth in the morning.     lidocaine -prilocaine  (EMLA ) cream Apply to affected area once 30 g 3   magnesium  oxide (MAG-OX) 400 (241.3 Mg) MG tablet Take 1 tablet (400 mg total) by mouth every evening.     mirtazapine  (REMERON ) 7.5 MG tablet Take 1 tablet (7.5 mg total) by mouth at bedtime. 30 tablet 1   ondansetron  (ZOFRAN ) 8 MG tablet Take 1 tablet (8 mg total) by mouth every 8 (eight) hours as needed for nausea or vomiting. Start on the third day after irinotecan  30 tablet 1   oxyCODONE -acetaminophen  (PERCOCET/ROXICET) 5-325 MG tablet Take 1 tablet by mouth every 6 (six) hours as needed for severe pain (pain score 7-10). 12 tablet 0   potassium chloride  (KLOR-CON ) 10 MEQ tablet TAKE 1 TABLET BY MOUTH EVERY DAY (Patient taking differently: Take 10 mEq by mouth 2 (two) times daily.) 90 tablet 1   prochlorperazine  (COMPAZINE ) 10 MG tablet Take 1 tablet (10 mg total) by mouth every 6 (six) hours as needed for nausea or vomiting. 30 tablet 1   simvastatin (ZOCOR) 80 MG tablet Take 80 mg by mouth every evening.     No current facility-administered medications for this visit.   Facility-Administered Medications Ordered in Other Visits  Medication Dose Route Frequency Provider Last Rate Last Admin   dextrose  5 % solution   Intravenous Continuous Lanny Callander, MD 10 mL/hr at 12/26/23 1117 New Bag at 12/26/23 1117   fluorouracil  (ADRUCIL ) 3,900 mg in sodium chloride  0.9 % 72 mL chemo infusion  2,400 mg/m2 (Treatment Plan Recorded) Intravenous 1 day or 1 dose Lanny Callander, MD       heparin  lock flush 100 unit/mL  500 Units Intracatheter Once PRN Lanny Callander, MD       leucovorin  648 mg in dextrose  5 % 250 mL infusion  400 mg/m2 (Treatment Plan Recorded) Intravenous Once Lanny Callander, MD 141 mL/hr at 12/26/23 1406 648 mg at  12/26/23 1406   oxaliplatin  (ELOXATIN ) 80 mg in dextrose  5 % 500 mL chemo infusion  50 mg/m2 (Treatment Plan Recorded) Intravenous Once Lanny Callander, MD 258 mL/hr at 12/26/23 1409 80 mg at 12/26/23 1409   sodium chloride  flush (NS) 0.9 % injection 10 mL  10 mL Intracatheter PRN Lanny Callander, MD        PHYSICAL EXAMINATION: ECOG PERFORMANCE STATUS: 1 - Symptomatic but completely ambulatory  Vitals:   12/26/23 1015  BP: (!) 140/82  Pulse: 82  Resp: 16  Temp: 98 F (36.7 C)  SpO2: 100%   Wt Readings from Last 3 Encounters:  12/26/23 55.5 kg  12/12/23 55.2 kg  11/28/23 57.6 kg     GENERAL:alert, no distress and comfortable SKIN: skin color, texture, turgor are normal, no rashes or significant lesions EYES: normal, Conjunctiva are pink and non-injected,  sclera clear NECK: supple, thyroid normal size, non-tender, without nodularity LYMPH:  no palpable lymphadenopathy in the cervical, axillary  LUNGS: clear to auscultation and percussion with normal breathing effort HEART: regular rate & rhythm and no murmurs and no lower extremity edema ABDOMEN:abdomen soft, non-tender and normal bowel sounds Musculoskeletal:no cyanosis of digits and no clubbing  NEURO: alert & oriented x 3 with fluent speech, no focal motor/sensory deficits    LABORATORY DATA:  I have reviewed the data as listed    Latest Ref Rng & Units 12/26/2023   10:11 AM 12/12/2023    9:04 AM 11/28/2023    9:34 AM  CBC  WBC 4.0 - 10.5 K/uL 9.9  6.9  6.0   Hemoglobin 12.0 - 15.0 g/dL 88.4  88.6  88.2   Hematocrit 36.0 - 46.0 % 34.2  33.6  33.7   Platelets 150 - 400 K/uL 330  324  315         Latest Ref Rng & Units 12/26/2023   10:11 AM 12/12/2023    9:04 AM 11/28/2023    9:34 AM  CMP  Glucose 70 - 99 mg/dL 871  861  845   BUN 8 - 23 mg/dL 8  12  14    Creatinine 0.44 - 1.00 mg/dL 9.16  9.12  9.09   Sodium 135 - 145 mmol/L 141  134  137   Potassium 3.5 - 5.1 mmol/L 2.9  3.1  3.0   Chloride 98 - 111 mmol/L 104  96  103    CO2 22 - 32 mmol/L 32  27  27   Calcium  8.9 - 10.3 mg/dL 8.0  8.1  8.3   Total Protein 6.5 - 8.1 g/dL 5.3  5.4  5.9   Total Bilirubin 0.0 - 1.2 mg/dL 1.0  0.5  0.5   Alkaline Phos 38 - 126 U/L 248  102  89   AST 15 - 41 U/L 58  13  13   ALT 0 - 44 U/L 224  10  11       RADIOGRAPHIC STUDIES: I have personally reviewed the radiological images as listed and agreed with the findings in the report. No results found.    No orders of the defined types were placed in this encounter.  All questions were answered. The patient knows to call the clinic with any problems, questions or concerns. No barriers to learning was detected. The total time spent in the appointment was 40 minutes.     Onita Mattock, MD 12/26/2023

## 2023-12-26 NOTE — Progress Notes (Signed)
 [facility] CSW Progress Note  Visual Merchandiser  met w/ pt and spouse in infusion.  Pt reports she has been approved through work for short term disability through March 20th.  Pt also has long term disability benefits through work.  Per pt when she underwent treatment in 21/22 she was approved for SSDI and after treatment was able to go back to work.  At the time pt's employer had an outside company that assisted employees w/ applying for [employer].  Pt is unsure if this benefit is still available.  CSW informed pt of the [facility] and will send a referral in on behalf of pt if needed.  Pt's spouse is retired.  Pt states she is hopeful she will not need long term disability and is optimistic she will beat cancer again.  CSW to remain available as appropriate to provide support throughout duration of treatment.        Devere JONELLE Manna, LCSW Clinical Social Worker [facility]

## 2023-12-27 ENCOUNTER — Other Ambulatory Visit: Payer: Self-pay

## 2023-12-28 ENCOUNTER — Inpatient Hospital Stay: Payer: BC Managed Care – PPO

## 2023-12-28 VITALS — BP 152/74 | HR 75 | Temp 98.3°F

## 2023-12-28 DIAGNOSIS — Z5111 Encounter for antineoplastic chemotherapy: Secondary | ICD-10-CM | POA: Diagnosis not present

## 2023-12-28 DIAGNOSIS — C250 Malignant neoplasm of head of pancreas: Secondary | ICD-10-CM

## 2023-12-28 MED ORDER — SODIUM CHLORIDE 0.9% FLUSH
10.0000 mL | INTRAVENOUS | Status: DC | PRN
  Administered 2023-12-28: 10 mL

## 2023-12-28 MED ORDER — HEPARIN SOD (PORK) LOCK FLUSH 100 UNIT/ML IV SOLN
500.0000 [IU] | Freq: Once | INTRAVENOUS | Status: AC | PRN
Start: 2023-12-28 — End: 2023-12-28
  Administered 2023-12-28: 500 [IU]

## 2023-12-28 MED ORDER — PEGFILGRASTIM-JMDB 6 MG/0.6ML ~~LOC~~ SOSY
6.0000 mg | PREFILLED_SYRINGE | Freq: Once | SUBCUTANEOUS | Status: AC
  Administered 2023-12-28: 6 mg via SUBCUTANEOUS
  Filled 2023-12-28: qty 0.6

## 2023-12-30 ENCOUNTER — Telehealth: Payer: Self-pay

## 2023-12-30 NOTE — Telephone Encounter (Signed)
 Pt advised to increase her oral potassium by 1 tab.  She is currently on 2 a day and will increase to 3 a day. Appt for 2/20 confirmed

## 2023-12-30 NOTE — Telephone Encounter (Signed)
-----   Message from Sonja [place] sent at 12/27/2023  9:06 PM EST ----- Please let pt know her K is low, please increase oral KCL by 1 tab /day, thx   Sonja [place]

## 2024-01-03 ENCOUNTER — Other Ambulatory Visit: Payer: Self-pay | Admitting: Nurse Practitioner

## 2024-01-07 ENCOUNTER — Other Ambulatory Visit (HOSPITAL_COMMUNITY): Payer: No Typology Code available for payment source

## 2024-01-08 NOTE — Assessment & Plan Note (Signed)
stage IIB, [name], [name], [name] -Diagnosed 08/24/20 on EUS with Dr Dulce Sellar, which showed mass at head of pancreas, s/p stenting, with SMV and PV invasion, biopsy confirmed adenocarcinoma with few malignant-appearing LNs in peripancreatic and porta hepatis region. Staging was negative for metastatic disease.  -she completed neoadjuvant chemo FOLFIRINOX q2 weeks 09/12/20 - 12/29/20. -s/p whipple surgery by Dr Flonnie Hailstone on 02/08/21, path showed no residual disease.  -due to the high recurrence risk of pancreatic cancer, she is under close surveillance.  -Her PET scan from September 30, 2023 showed hypermetabolic soft tissue mass at the previous Whipple surgical site, and a small mildly hypermetabolic nodule in the left lung, concerning for metastatic recurrence. -lung biopsy 10/28/2023 confirmed adenocarcinoma, IHC consistent with metastatic pancreatic ca.  -she started chemo NALIRIFOX  on 11/14/23, had a significant diarrhea, required dose reduction.

## 2024-01-09 ENCOUNTER — Other Ambulatory Visit: Payer: No Typology Code available for payment source

## 2024-01-09 ENCOUNTER — Ambulatory Visit: Payer: BC Managed Care – PPO | Admitting: Physician Assistant

## 2024-01-09 ENCOUNTER — Inpatient Hospital Stay: Payer: BC Managed Care – PPO

## 2024-01-09 ENCOUNTER — Inpatient Hospital Stay (HOSPITAL_BASED_OUTPATIENT_CLINIC_OR_DEPARTMENT_OTHER): Payer: BC Managed Care – PPO | Admitting: Hematology

## 2024-01-09 ENCOUNTER — Encounter: Payer: Self-pay | Admitting: Hematology

## 2024-01-09 ENCOUNTER — Ambulatory Visit: Payer: No Typology Code available for payment source | Admitting: Hematology

## 2024-01-09 VITALS — BP 121/63 | HR 89

## 2024-01-09 VITALS — BP 126/76 | HR 108 | Resp 16

## 2024-01-09 VITALS — BP 135/73 | HR 89 | Temp 97.7°F | Resp 15 | Wt 120.5 lb

## 2024-01-09 DIAGNOSIS — Z5111 Encounter for antineoplastic chemotherapy: Secondary | ICD-10-CM | POA: Diagnosis not present

## 2024-01-09 DIAGNOSIS — C250 Malignant neoplasm of head of pancreas: Secondary | ICD-10-CM

## 2024-01-09 DIAGNOSIS — Z95828 Presence of other vascular implants and grafts: Secondary | ICD-10-CM

## 2024-01-09 LAB — CMP (CANCER CENTER ONLY)
ALT: 24 U/L (ref 0–44)
AST: 24 U/L (ref 15–41)
Albumin: 3.2 g/dL — ABNORMAL LOW (ref 3.5–5.0)
Alkaline Phosphatase: 158 U/L — ABNORMAL HIGH (ref 38–126)
Anion gap: 7 (ref 5–15)
BUN: 11 mg/dL (ref 8–23)
CO2: 27 mmol/L (ref 22–32)
Calcium: 8.1 mg/dL — ABNORMAL LOW (ref 8.9–10.3)
Chloride: 107 mmol/L (ref 98–111)
Creatinine: 0.78 mg/dL (ref 0.44–1.00)
GFR, Estimated: >60 mL/min (ref >60)
Glucose, Bld: 145 mg/dL — ABNORMAL HIGH (ref 70–99)
Potassium: 2.7 mmol/L — CL (ref 3.5–5.1)
Sodium: 141 mmol/L (ref 135–145)
Total Bilirubin: 0.5 mg/dL (ref 0.0–1.2)
Total Protein: 5.2 g/dL — ABNORMAL LOW (ref 6.5–8.1)

## 2024-01-09 LAB — CBC WITH DIFFERENTIAL (CANCER CENTER ONLY)
Abs Immature Granulocytes: 0.41 10*3/uL — ABNORMAL HIGH (ref 0.00–0.07)
Basophils Absolute: 0.1 10*3/uL (ref 0.0–0.1)
Basophils Relative: 1 %
Eosinophils Absolute: 0.2 10*3/uL (ref 0.0–0.5)
Eosinophils Relative: 2 %
HCT: 31 % — ABNORMAL LOW (ref 36.0–46.0)
Hemoglobin: 10 g/dL — ABNORMAL LOW (ref 12.0–15.0)
Immature Granulocytes: 5 %
Lymphocytes Relative: 23 %
Lymphs Abs: 1.9 10*3/uL (ref 0.7–4.0)
MCH: 29 pg (ref 26.0–34.0)
MCHC: 32.3 g/dL (ref 30.0–36.0)
MCV: 89.9 fL (ref 80.0–100.0)
Monocytes Absolute: 0.7 10*3/uL (ref 0.1–1.0)
Monocytes Relative: 9 %
Neutro Abs: 5.2 10*3/uL (ref 1.7–7.7)
Neutrophils Relative %: 60 %
Platelet Count: 234 10*3/uL (ref 150–400)
RBC: 3.45 MIL/uL — ABNORMAL LOW (ref 3.87–5.11)
RDW: 18.2 % — ABNORMAL HIGH (ref 11.5–15.5)
WBC Count: 8.5 10*3/uL (ref 4.0–10.5)
nRBC: 0 % (ref 0.0–0.2)

## 2024-01-09 MED ORDER — HEPARIN SOD (PORK) LOCK FLUSH 100 UNIT/ML IV SOLN: 500.0000 [IU] | Freq: Once | INTRAVENOUS | Status: DC | PRN

## 2024-01-09 MED ORDER — OXALIPLATIN CHEMO INJECTION 100 MG/20ML
50.0000 mg/m2 | Freq: Once | INTRAVENOUS | Status: AC
  Administered 2024-01-09: 80 mg via INTRAVENOUS
  Filled 2024-01-09: qty 16

## 2024-01-09 MED ORDER — SODIUM CHLORIDE 0.9 % IV SOLN
2400.0000 mg/m2 | INTRAVENOUS | Status: DC
  Administered 2024-01-09: 3900 mg via INTRAVENOUS
  Filled 2024-01-09: qty 78

## 2024-01-09 MED ORDER — LEUCOVORIN CALCIUM INJECTION 350 MG
400.0000 mg/m2 | Freq: Once | INTRAVENOUS | Status: AC
  Administered 2024-01-09: 648 mg via INTRAVENOUS
  Filled 2024-01-09: qty 32.4

## 2024-01-09 MED ORDER — DEXTROSE 5 % IV SOLN: INTRAVENOUS | Status: DC

## 2024-01-09 MED ORDER — POTASSIUM CHLORIDE 10 MEQ/100ML IV SOLN
10.0000 meq | INTRAVENOUS | Status: AC
  Administered 2024-01-09: 10 meq via INTRAVENOUS
  Filled 2024-01-09 (×2): qty 100

## 2024-01-09 MED ORDER — SODIUM CHLORIDE 0.9% FLUSH
10.0000 mL | INTRAVENOUS | Status: DC | PRN
  Administered 2024-01-09: 10 mL

## 2024-01-09 MED ORDER — ATROPINE SULFATE 1 MG/ML IV SOLN
0.5000 mg | Freq: Once | INTRAVENOUS | Status: AC | PRN
  Administered 2024-01-09: 0.5 mg via INTRAVENOUS
  Filled 2024-01-09: qty 1

## 2024-01-09 MED ORDER — PROCHLORPERAZINE MALEATE 10 MG PO TABS: 10.0000 mg | ORAL_TABLET | Freq: Four times a day (QID) | ORAL | 2 refills | Status: DC | PRN

## 2024-01-09 MED ORDER — POTASSIUM CHLORIDE CRYS ER 20 MEQ PO TBCR: 20.0000 meq | EXTENDED_RELEASE_TABLET | Freq: Two times a day (BID) | ORAL | 1 refills | Status: DC

## 2024-01-09 MED ORDER — LORATADINE 10 MG PO TABS
10.0000 mg | ORAL_TABLET | Freq: Once | ORAL | Status: AC
  Administered 2024-01-09: 10 mg via ORAL
  Filled 2024-01-09: qty 1

## 2024-01-09 MED ORDER — DEXAMETHASONE SODIUM PHOSPHATE 10 MG/ML IJ SOLN
10.0000 mg | Freq: Once | INTRAMUSCULAR | Status: AC
  Administered 2024-01-09: 10 mg via INTRAVENOUS
  Filled 2024-01-09: qty 1

## 2024-01-09 MED ORDER — SODIUM CHLORIDE 0.9 % IV SOLN
35.0000 mg/m2 | Freq: Once | INTRAVENOUS | Status: AC
  Administered 2024-01-09: 55.9 mg via INTRAVENOUS
  Filled 2024-01-09: qty 13

## 2024-01-09 MED ORDER — FAMOTIDINE IN NACL 20-0.9 MG/50ML-% IV SOLN
20.0000 mg | Freq: Once | INTRAVENOUS | Status: AC
  Administered 2024-01-09: 20 mg via INTRAVENOUS
  Filled 2024-01-09: qty 50

## 2024-01-09 MED ORDER — PALONOSETRON HCL INJECTION 0.25 MG/5ML
0.2500 mg | Freq: Once | INTRAVENOUS | Status: AC
  Administered 2024-01-09: 0.25 mg via INTRAVENOUS
  Filled 2024-01-09: qty 5

## 2024-01-09 MED ORDER — SODIUM CHLORIDE 0.9% FLUSH: 10.0000 mL | INTRAVENOUS | Status: DC | PRN

## 2024-01-09 MED ORDER — FAMOTIDINE IN NACL 20-0.9 MG/50ML-% IV SOLN
20.0000 mg | Freq: Once | INTRAVENOUS | Status: AC | PRN
  Administered 2024-01-09: 20 mg via INTRAVENOUS

## 2024-01-09 NOTE — Progress Notes (Signed)
CRITICAL VALUE STICKER  CRITICAL VALUE: Potassium 2.7  RECEIVER (on-site recipient of call): Elease Etienne, RN  DATE & TIME NOTIFIED: 01/08/23 10:25  MESSENGER (representative from lab): Herbert Seta  MD NOTIFIED: Mosetta Putt  TIME OF NOTIFICATION:01/09/24 10:25  RESPONSE:

## 2024-01-09 NOTE — Patient Instructions (Addendum)
[facility] - A DEPT OF [facility]  Discharge Instructions: Thank you for choosing [facility] to provide your oncology and hematology care.   If you have a lab appointment with the Cancer Center, please go directly to the Cancer Center and check in at the registration area.   Wear comfortable clothing and clothing appropriate for easy access to any Portacath or PICC line.   We strive to give you quality time with your provider. You may need to reschedule your appointment if you arrive late (15 or more minutes).  Arriving late affects you and other patients whose appointments are after yours.  Also, if you miss three or more appointments without notifying the office, you may be dismissed from the clinic at the provider's discretion.      For prescription refill requests, have your pharmacy contact our office and allow 72 hours for refills to be completed.    Today you received the following chemotherapy and/or immunotherapy agents: Irinotecan liposomal, Oxaliplatin, Leucovorin, 5FU      To help prevent nausea and vomiting after your treatment, we encourage you to take your nausea medication as directed.  BELOW ARE SYMPTOMS THAT SHOULD BE REPORTED IMMEDIATELY: *FEVER GREATER THAN 100.4 F (38 C) OR HIGHER *CHILLS OR SWEATING *NAUSEA AND VOMITING THAT IS NOT CONTROLLED WITH YOUR NAUSEA MEDICATION *UNUSUAL SHORTNESS OF BREATH *UNUSUAL BRUISING OR BLEEDING *URINARY PROBLEMS (pain or burning when urinating, or frequent urination) *BOWEL PROBLEMS (unusual diarrhea, constipation, pain near the anus) TENDERNESS IN MOUTH AND THROAT WITH OR WITHOUT PRESENCE OF ULCERS (sore throat, sores in mouth, or a toothache) UNUSUAL RASH, SWELLING OR PAIN  UNUSUAL VAGINAL DISCHARGE OR ITCHING   Items with * indicate a potential emergency and should be followed up as soon as possible or go to the Emergency Department if any problems should occur.  Please show the  CHEMOTHERAPY ALERT CARD or IMMUNOTHERAPY ALERT CARD at check-in to the Emergency Department and triage nurse.  Should you have questions after your visit or need to cancel or reschedule your appointment, please contact [facility] - A DE[facility]  Dept: [phone]  and follow the prompts.  Office hours are 8:00 a.m. to 4:30 p.m. Monday - Friday. Please note that voicemails left after 4:00 p.m. may not be returned until the following business day.  We are closed weekends and major holidays. You have access to a nurse at all times for urgent questions. Please call the main number to the clinic Dept: [phone] and follow the prompts.   For any non-urgent questions, you may also contact your provider using MyChart. We now offer e-Visits for anyone 61 and older to request care online for non-urgent symptoms. For details visit [email or web address].   Also download the MyChart app! Go to the app store, search "MyChart", open the app, select [name], and log in with your MyChart username and password.  Hypokalemia Hypokalemia means that the amount of potassium in the blood is lower than normal. Potassium is a mineral (electrolyte) that helps regulate the amount of fluid in the body. It also stimulates muscle tightening (contraction) and helps nerves work properly. Normally, most of the body's potassium is inside cells, and only a very small amount is in the blood. Because the amount in the blood is so small, minor changes to potassium levels in the blood can be life-threatening. What are the causes? This condition may be  caused by: Antibiotic medicine. Diarrhea or vomiting. Taking too much of a medicine that helps you have a bowel movement (laxative) can cause diarrhea and lead to hypokalemia. Chronic kidney disease (CKD). Medicines that help the body get rid of excess fluid (diuretics). Eating disorders, such as anorexia or bulimia. Low magnesium levels  in the body. Sweating a lot. What are the signs or symptoms? Symptoms of this condition include: Weakness. Constipation. Fatigue. Muscle cramps. Mental confusion. Skipped heartbeats or irregular heartbeat (palpitations). Tingling or numbness. How is this diagnosed? This condition is diagnosed with a blood test. How is this treated? This condition may be treated by: Taking potassium supplements. Adjusting the medicines that you take. Eating more foods that contain a lot of potassium. If your potassium level is very low, you may need to get potassium through an IV and be monitored in the hospital. Follow these instructions at home: Eating and drinking  Eat a healthy diet. A healthy diet includes fresh fruits and vegetables, whole grains, healthy fats, and lean proteins. If told, eat more foods that contain a lot of potassium. These include: Nuts, such as peanuts and pistachios. Seeds, such as sunflower seeds and pumpkin seeds. Peas, lentils, and lima beans. Whole grain and bran cereals and breads. Fresh fruits and vegetables, such as apricots, avocado, bananas, cantaloupe, kiwi, oranges, tomatoes, asparagus, and potatoes. Juices, such as orange, tomato, and prune. Lean meats, including fish. Milk and milk products, such as yogurt. General instructions Take over-the-counter and prescription medicines only as told by your health care provider. This includes vitamins, natural food products, and supplements. Keep all follow-up visits. This is important. Contact a health care provider if: You have weakness that gets worse. You feel your heart pounding or racing. You vomit. You have diarrhea. You have diabetes and you have trouble keeping your blood sugar in your target range. Get help right away if: You have chest pain. You have shortness of breath. You have vomiting or diarrhea that lasts for more than 2 days. You faint. These symptoms may be an emergency. Get help right away.  Call 911. Do not wait to see if the symptoms will go away. Do not drive yourself to the hospital. Summary Hypokalemia means that the amount of potassium in the blood is lower than normal. This condition is diagnosed with a blood test. Hypokalemia may be treated by taking potassium supplements, adjusting the medicines that you take, or eating more foods that are high in potassium. If your potassium level is very low, you may need to get potassium through an IV and be monitored in the hospital. This information is not intended to replace advice given to you by your health care provider. Make sure you discuss any questions you have with your health care provider. Document Revised: 07/20/2021 Document Reviewed: 07/20/2021 Elsevier Patient Education  2024 [employer].

## 2024-01-09 NOTE — Progress Notes (Signed)
    DATE:  01/09/24                                        X CHEMO/IMMUNOTHERAPY REACTION           MD: Mosetta Putt   AGENT/BLOOD PRODUCT RECEIVING TODAY:        Chemotherapy NALIRIFOX every 2 weeks    AGENT/BLOOD PRODUCT RECEIVING IMMEDIATELY PRIOR TO REACTION:          Irinotecan liposome     Vitals:   01/09/24 1255 01/09/24 1300  BP: (!) 183/82 126/76  Pulse: (!) 128 (!) 108  Resp: 16   SpO2: 100% 100%      REACTION(S):           nausea, vomiting, burning in throat   PREMEDS:     Pepcid 20 mg IV, Decadron 10 mg IV, Claritin 10 mg PO, Aloxi 0.25 mg IV. Atropine 0.5 mg IV   INTERVENTION: Pepcid 20 mg IV   Review of Systems  Review of Systems  HENT:  Positive for sore throat.   Gastrointestinal:  Positive for nausea and vomiting.  All other systems reviewed and are negative.    Physical Exam  Physical Exam Vitals and nursing note reviewed.  Constitutional:      Appearance: She is not ill-appearing or toxic-appearing.  HENT:     Head: Normocephalic.  Eyes:     Conjunctiva/sclera: Conjunctivae normal.  Cardiovascular:     Rate and Rhythm: Regular rhythm. Tachycardia present.     Pulses: Normal pulses.     Heart sounds: Normal heart sounds.  Pulmonary:     Effort: Pulmonary effort is normal.     Breath sounds: Normal breath sounds.  Abdominal:     General: There is no distension.  Musculoskeletal:     Cervical back: Normal range of motion.  Skin:    General: Skin is warm and dry.  Neurological:     Mental Status: She is alert.     OUTCOME:                  Patient became symptomatic 20 minutes into infusion. Patient tolerated last treatment well. Symptoms started after eating a sandwich and chips, which she normally does not do. ?if symptoms are related to hypersensitivity reaction or change in her diet. Emergency medications were administered as documented above as a precaution. Patient returned to baseline. Oncologist notified and agrees to resume treatment.  Patient tolerated remainder of treatment.

## 2024-01-09 NOTE — Progress Notes (Signed)
Per Dr. Mosetta Putt, patient instructed to increase KCl supplement to 20 mEq (2 tablets) BID.  Patient verbalized understanding.

## 2024-01-09 NOTE — Progress Notes (Signed)
[facility]   Telephone:([phone]) [phone] [phone]   Clinic Follow up Note   Patient Care Team: Leola Brazil, DO as PCP - General (Internal Medicine) Malachy Mood, MD as Consulting Physician (Oncology) Fritzi Mandes, MD as Consulting Physician (General Surgery)  Date of Service:  01/09/2024  CHIEF COMPLAINT: f/u of pancreatic cancer  CURRENT THERAPY:  Chemotherapy NALIRIFOX every 2 weeks   Oncology History   Pancreatic cancer (HCC) stage IIB, [name], [name], [name] -Diagnosed 08/24/20 on EUS with Dr Dulce Sellar, which showed mass at head of pancreas, s/p stenting, with SMV and PV invasion, biopsy confirmed adenocarcinoma with few malignant-appearing LNs in peripancreatic and porta hepatis region. Staging was negative for metastatic disease.  -she completed neoadjuvant chemo FOLFIRINOX q2 weeks 09/12/20 - 12/29/20. -s/p whipple surgery by Dr Flonnie Hailstone on 02/08/21, path showed no residual disease.  -due to the high recurrence risk of pancreatic cancer, she is under close surveillance.  -Her PET scan from September 30, 2023 showed hypermetabolic soft tissue mass at the previous Whipple surgical site, and a small mildly hypermetabolic nodule in the left lung, concerning for metastatic recurrence. -lung biopsy 10/28/2023 confirmed adenocarcinoma, IHC consistent with metastatic pancreatic ca.  -she started chemo NALIRIFOX  on 11/14/23, had a significant diarrhea, required dose reduction.   Assessment and Plan    Pancreatic Cancer 63 year old female with pancreatic cancer undergoing chemotherapy. Reports reduced nausea with latest round. Hemoglobin at 10, no transfusion needed. Pending kidney and liver function tests. Tumor marker to be repeated every four weeks. CT scan scheduled for March 18. Next chemotherapy treatments on March 6 and March 20. Discussed monitoring tumor markers and CT scans for treatment efficacy. Patient  prefers to continue current regimen and has scheduled additional cycles. - Refill metyrapone prescription - Refill anti-nausea medication - Repeat tumor marker every four weeks - CT scan on March 18 - Next chemotherapy treatments on March 6 and March 20 - Review CT scan results on March 20 - Schedule additional chemotherapy cycles as needed  Hypertension Blood pressure noted to be elevated today. - Monitor blood pressure  Hypokalemia -Potassium 2.7 today, possible related to hydrochlorothiazide -She is on potassium 10 mill equal 3 times a day, will increase to 20 mill equal twice daily -Will give IV potassium today  Plan -Lab reviewed, adequate for treatment, will proceed same dose reduced chemotherapy and continue every 2 weeks -Restaging CT scan scheduled in 5 weeks -Follow-up in 2 weeks        SUMMARY OF ONCOLOGIC HISTORY: Oncology History Overview Note  Cancer Staging Pancreatic cancer [facility]) Staging form: Exocrine Pancreas, AJCC 8th Edition - Clinical stage from 08/24/2020: Stage IIB (cT2, cN1, cM0) - Signed by Malachy Mood, MD on 08/30/2020 Stage prefix: Initial diagnosis    Pancreatic cancer (HCC)  08/20/2020 Imaging   CT AP 08/20/20  IMPRESSION: 1. 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 Collateral circulation is noted. There is moderate intrahepatic and extrahepatic biliary dilatation which appears to be due to the pancreatic head mass. Pancreatic ductal dilatation is noted as well. 2. Probable 2.7 cm uterine fibroid. 3. Aortic atherosclerosis.   Aortic Atherosclerosis (ICD10-I70.0).   08/20/2020 Tumor Marker   Ca19-9 - 927   08/22/2020 Procedure   ERCP by Dr Ewing Schlein 08/22/20  IMPRESSION - The major papilla appeared normal. - A biliary sphincterotomy was performed. - Cells for  cytology obtained in the lower third and middle of the main duct. - One plastic stent was placed into the common bile duct.   FINAL MICROSCOPIC DIAGNOSIS:  - No malignant cells identified  - Benign reactive/reparative changes   08/24/2020 Cancer Staging   Staging form: Exocrine Pancreas, AJCC 8th Edition - Clinical stage from 08/24/2020: Stage IIB (cT2, cN1, cM0) - Signed by Malachy Mood, MD on 08/30/2020   08/24/2020 Procedure   EUS by Dr Dulce Sellar 08/24/20  IMPRESSION - There was no sign of significant pathology in the ampulla. - A few malignant-appearing lymph nodes were visualized in the peripancreatic region and porta hepatis region. - One stent was visualized endosonographically in the common bile duct. - A mass was identified in the pancreatic head. Tissue was obtained from this exam. The preliminary diagnosis is consistent with adenocarcinoma. Invasion into SMV/PV seen. Lymphadenopathy noted. This was staged T3 N1 Mx by endosonographic criteria. Fine needle aspiration performed.   08/24/2020 Initial Biopsy   FINAL MICROSCOPIC DIAGNOSIS: 08/24/20 - Malignant cells consistent with adenocarcinoma    08/30/2020 Initial Diagnosis   Pancreatic cancer (HCC)   09/05/2020 Imaging   CT Chest  IMPRESSION: 1. Stable 2.7 cm infiltrating pancreatic head mass with borderline enlarged peripancreatic lymph nodes. 2. No findings for pulmonary metastatic disease. 3. Small hiatal hernia. 4. Aortic atherosclerosis.   Aortic Atherosclerosis (ICD10-I70.0).   09/08/2020 Procedure   INSERTION PORT-A-CATH by Dr Freida Busman and Donell Beers    09/12/2020 - 12/29/2020 Chemotherapy   Neoadjuvant FOLFIRINOX q2 weeks starting 09/12/20-12/29/20    12/02/2020 Imaging   CT CAP  IMPRESSION: 1. Previously noted mass of the central pancreatic head is almost entirely resolved, difficult to discretely appreciate on current examination. Findings are consistent with treatment response. There remains obstruction of the  pancreatic duct near the head neck junction with mild prominence of the pancreatic duct, measuring up to 5 mm, with atrophy of the distal pancreatic parenchyma. 2. The portal vein, splenic vein, and superior mesenteric vein are now widely patent, previously effaced at the confluence by mass effect. 3. Interval placement of common bile duct stent, tip positioned in the distal duodenum, with relief of previously seen biliary ductal dilatation. 4. 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 Following the bifurcation of the celiac mesenteric trunk, the celiac axis appears to directly traverse the vicinity of the mass, although there does appear to be a fat plane about the vessel. 5. The distal small bowel and colon are diffusely somewhat hyperenhancing and inflamed appearing with vascular combing and a tethered appearance of the distal small bowel. This appearance generally suggests inflammatory bowel disease such as Crohn's disease. Correlate with referable clinical history, if present. No evidence of obstruction or other acute complication. 6. Hepatic steatosis. 7. Aortic atherosclerosis.   12/28/2020 Genetic Testing   Negative hereditary cancer genetic testing: no pathogenic variants detected in [employer] Common Hereditary Cancers Panel.  The report date is December 28, 2020.    The Common Hereditary Cancers Panel offered by [employer] includes sequencing and/or deletion duplication testing of the following 47 genes: APC, ATM, AXIN2, BARD1, BMPR1A, BRCA1, BRCA2, BRIP1, CDH1, CDK4, CDKN2A (p14ARF), CDKN2A (p16INK4a), CHEK2, CTNNA1, DICER1, EPCAM (Deletion/duplication testing only), GREM1 (promoter region deletion/duplication testing only), GREM1, HOXB13, KIT, MEN1, MLH1, MSH2, MSH3,  MSH6, MUTYH, NBN, NF1, NHTL1, PALB2, PDGFRA, PMS2, POLD1,  POLE, PTEN, RAD50, RAD51C, RAD51D, SDHA, SDHB, SDHC, SDHD, SMAD4, SMARCA4. STK11, TP53, TSC1, TSC2, and VHL.  The following genes were evaluated for sequence changes only: SDHA and HOXB13 c.251G>A variant only.   01/17/2021 Imaging   CT CAP from [facility] IMPRESSION Small lesion in the pancreatic head measuring approximately 1.1 x 0.8 cm without evidence of vascular involvement or metastasis consistent with previously described, biopsy-proven pancreatic adenocarcinoma.   02/08/2021 Surgery   Whipple Surgery with Dr Deveron Furlong  Final Pathologic Diagnosis      A.  GALLBLADDER, CHOLECYSTECTOMY: Chronic cholecystitis. No malignancy identified.   B.  BILE DUCT STENT, REMOVAL (GROSS ONLY DIAGNOSIS): Stent, see gross description.   C.  WHIPPLE RESECTION: No residual malignancy identified. Chronic and acute inflammation with granulation tissue reaction, fibrosis and features of chronic pancreatitis, suggestive of therapy effect. Fourteen lymph nodes, negative for metastasis (0/14). Margins negative for malignancy.        06/07/2021 Imaging   CT AP  IMPRESSION: 1. No current findings of recurrent malignancy. Interval Whipple procedure with expected postoperative findings. 2. A 3.5 cm stent is present in the dorsal pancreatic duct. No duct dilatation. There is an approximately 1.5 mm lucency centrally along this stent shown on image 43 series 7, possibilities include stent fracture, two separate stents, or an intentional radial lucency in this type of stent. 3. Small type 1 hiatal hernia. Mild distal esophageal wall thickening may reflect low-grade esophagitis. 4.  Aortic Atherosclerosis (ICD10-I70.0). 5.  Prominent stool throughout the colon favors constipation. 6. Uterine fibroid. 7. 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   11/14/2023 -  Chemotherapy   Patient is on Treatment Plan : PANCREAS NALIRIFOX D1, 15 Q28D     Port-A-Cath in place      Discussed the use of AI scribe software for clinical note transcription with the patient, who gave verbal consent to proceed.  History of Present Illness   The patient is a 63 year old female with a known diagnosis of pancreatic cancer, currently undergoing chemotherapy. She reports an improvement in her overall condition, with this round of chemotherapy being easier to tolerate. She has experienced less nausea, although she continues to take her prescribed medication at night to manage this symptom. She requests refills for her medications, including metyrapone and an unspecified medication for nausea. The patient's blood tests reveal a slightly lower hemoglobin level than her previous visit, but it is not significantly concerning. The patient has not reported any diarrhea.         All other systems were reviewed with the patient and are negative.  MEDICAL HISTORY:  Past Medical History:  Diagnosis Date   Anemia    Anxiety    Cancer (HCC) 07/2020   pancreatic cancer   Depression    Diabetes mellitus without complication (HCC) 07/2020   pancreatic cancer   High cholesterol    History of blood transfusion    History of hiatal hernia    small   Hypertension    Stroke [facility])    age 38, no residual effect    SURGICAL HISTORY: Past Surgical History:  Procedure Laterality Date   BILIARY BRUSHING  08/22/2020   Procedure: BILIARY BRUSHING;  Surgeon: Vida Rigger, MD;  Location: [facility] ENDOSCOPY;  Service: Endoscopy;;   BILIARY STENT PLACEMENT N/A 08/22/2020   Procedure: BILIARY STENT PLACEMENT;  Surgeon: Vida Rigger, MD;  Location: [facility] ENDOSCOPY;  Service: Endoscopy;  Laterality:  N/A;   BRONCHIAL BIOPSY  10/28/2023   Procedure: BRONCHIAL BIOPSIES;  Surgeon: Leslye Peer, MD;  Location: [facility] ENDOSCOPY;  Service: Pulmonary;;   BRONCHIAL BRUSHINGS  10/28/2023   Procedure: BRONCHIAL BRUSHINGS;  Surgeon: Leslye Peer, MD;  Location: [facility] ENDOSCOPY;  Service: Pulmonary;;   BRONCHIAL NEEDLE  ASPIRATION BIOPSY  10/28/2023   Procedure: BRONCHIAL NEEDLE ASPIRATION BIOPSIES;  Surgeon: Leslye Peer, MD;  Location: [facility] ENDOSCOPY;  Service: Pulmonary;;   ENDOSCOPIC RETROGRADE CHOLANGIOPANCREATOGRAPHY (ERCP) WITH PROPOFOL N/A 08/22/2020   Procedure: ENDOSCOPIC RETROGRADE CHOLANGIOPANCREATOGRAPHY (ERCP) WITH PROPOFOL;  Surgeon: Vida Rigger, MD;  Location: [facility] ENDOSCOPY;  Service: Endoscopy;  Laterality: N/A;   ESOPHAGOGASTRODUODENOSCOPY (EGD) WITH PROPOFOL N/A 08/24/2020   Procedure: ESOPHAGOGASTRODUODENOSCOPY (EGD) WITH PROPOFOL;  Surgeon: Willis Modena, MD;  Location: [facility] ENDOSCOPY;  Service: Endoscopy;  Laterality: N/A;   FIDUCIAL MARKER PLACEMENT  10/28/2023   Procedure: FIDUCIAL MARKER PLACEMENT;  Surgeon: Leslye Peer, MD;  Location: [facility] ENDOSCOPY;  Service: Pulmonary;;   FINE NEEDLE ASPIRATION N/A 08/24/2020   Procedure: FINE NEEDLE ASPIRATION (FNA) LINEAR;  Surgeon: Willis Modena, MD;  Location: [facility] ENDOSCOPY;  Service: Endoscopy;  Laterality: N/A;   PORT-A-CATH REMOVAL N/A 12/08/2021   Procedure: REMOVAL PORT-A-CATH;  Surgeon: Fritzi Mandes, MD;  Location: [facility] OR;  Service: General;  Laterality: N/A;   PORTACATH PLACEMENT Right 09/08/2020   Procedure: INSERTION PORT-A-CATH;  Surgeon: Fritzi Mandes, MD;  Location: [facility];  Service: General;  Laterality: Right;   PORTACATH PLACEMENT N/A 11/06/2023   Procedure: INSERTION PORT-A-CATH;  Surgeon: Fritzi Mandes, MD;  Location: [facility] ORS;  Service: General;  Laterality: N/A;  LMA   SPHINCTEROTOMY  08/22/2020   Procedure: Dennison Mascot;  Surgeon: Vida Rigger, MD;  Location: [facility] ENDOSCOPY;  Service: Endoscopy;;   UPPER ESOPHAGEAL ENDOSCOPIC ULTRASOUND (EUS) N/A 08/24/2020   Procedure: UPPER ESOPHAGEAL ENDOSCOPIC ULTRASOUND (EUS);  Surgeon: Willis Modena, MD;  Location: [facility] ENDOSCOPY;  Service: Endoscopy;  Laterality: N/A;    I have reviewed the social history and family history with the patient and they are unchanged from  previous note.  ALLERGIES:  is allergic to tyloxapol, oxycodone-acetaminophen, and poison ivy extract.  MEDICATIONS:  Current Outpatient Medications  Medication Sig Dispense Refill   potassium chloride SA (KLOR-CON M) 20 MEQ tablet Take 1 tablet (20 mEq total) by mouth 2 (two) times daily. 60 tablet 1   acetaminophen (TYLENOL) 325 MG tablet Take 650 mg by mouth every 6 (six) hours as needed for moderate pain (pain score 4-6).     busPIRone (BUSPAR) 5 MG tablet Take 5 mg by mouth daily.     citalopram (CELEXA) 40 MG tablet Take 40 mg by mouth every evening.     CREON 36000-114000 units CPEP capsule TAKE 2 CAPSULES BY MOUTH 3 TIMES A DAY WITH A MEAL. MAY ALSO TAKE 1 CAPSULE AS NEEDED WITH SNACKS 240 capsule 2   dexamethasone (DECADRON) 4 MG tablet Take 2 tablets (8 mg total) by mouth daily. Start the day after irinotecan chemotherapy for 2 days. Take with food. 8 tablet 5   hydrochlorothiazide (MICROZIDE) 12.5 MG capsule Take 12.5 mg by mouth in the morning.     lidocaine-prilocaine (EMLA) cream Apply to affected area once 30 g 3   magnesium oxide (MAG-OX) 400 (241.3 Mg) MG tablet Take 1 tablet (400 mg total) by mouth every evening.     mirtazapine (REMERON) 7.5 MG tablet TAKE 1 TABLET BY MOUTH AT BEDTIME. 90 tablet 1   ondansetron (ZOFRAN) 8  MG tablet Take 1 tablet (8 mg total) by mouth every 8 (eight) hours as needed for nausea or vomiting. Start on the third day after irinotecan 30 tablet 1   oxyCODONE-acetaminophen (PERCOCET/ROXICET) 5-325 MG tablet Take 1 tablet by mouth every 6 (six) hours as needed for severe pain (pain score 7-10). 12 tablet 0   potassium chloride (KLOR-CON) 10 MEQ tablet TAKE 1 TABLET BY MOUTH EVERY DAY (Patient taking differently: Take 10 mEq by mouth 2 (two) times daily.) 90 tablet 1   prochlorperazine (COMPAZINE) 10 MG tablet Take 1 tablet (10 mg total) by mouth every 6 (six) hours as needed for nausea or vomiting. 30 tablet 2   simvastatin (ZOCOR) 80 MG tablet Take 80  mg by mouth every evening.     No current facility-administered medications for this visit.   Facility-Administered Medications Ordered in Other Visits  Medication Dose Route Frequency Provider Last Rate Last Admin   atropine injection 0.5 mg  0.5 mg Intravenous Once PRN Malachy Mood, MD       dextrose 5 % solution   Intravenous Continuous Malachy Mood, MD 10 mL/hr at 01/09/24 1039 New Bag at 01/09/24 1039   famotidine (PEPCID) IVPB 20 mg premix  20 mg Intravenous Once Malachy Mood, MD       fluorouracil (ADRUCIL) 3,900 mg in sodium chloride 0.9 % 72 mL chemo infusion  2,400 mg/m2 (Treatment Plan Recorded) Intravenous 1 day or 1 dose Malachy Mood, MD       heparin lock flush 100 unit/mL  500 Units Intracatheter Once PRN Malachy Mood, MD       irinotecan LIPOSOME (ONIVYDE) 55.9 mg in sodium chloride 0.9 % 500 mL chemo infusion  35 mg/m2 (Treatment Plan Recorded) Intravenous Once Malachy Mood, MD       leucovorin 648 mg in dextrose 5 % 250 mL infusion  400 mg/m2 (Treatment Plan Recorded) Intravenous Once Malachy Mood, MD       oxaliplatin (ELOXATIN) 80 mg in dextrose 5 % 500 mL chemo infusion  50 mg/m2 (Treatment Plan Recorded) Intravenous Once Malachy Mood, MD       sodium chloride flush (NS) 0.9 % injection 10 mL  10 mL Intracatheter PRN Malachy Mood, MD        PHYSICAL EXAMINATION: ECOG PERFORMANCE STATUS: 1 - Symptomatic but completely ambulatory  Vitals:   01/09/24 1001  BP: 135/73  Pulse: 89  Resp: 15  Temp: 97.7 F (36.5 C)  SpO2: 99%   Wt Readings from Last 3 Encounters:  01/09/24 120 lb 8 oz (54.7 kg)  12/26/23 122 lb 4.8 oz (55.5 kg)  12/12/23 121 lb 12.8 oz (55.2 kg)     GENERAL:alert, no distress and comfortable SKIN: skin color, texture, turgor are normal, no rashes or significant lesions EYES: normal, Conjunctiva are pink and non-injected, sclera clear NECK: supple, thyroid normal size, non-tender, without nodularity LYMPH:  no palpable lymphadenopathy in the cervical, axillary  LUNGS: clear  to auscultation and percussion with normal breathing effort HEART: regular rate & rhythm and no murmurs and no lower extremity edema ABDOMEN:abdomen soft, non-tender and normal bowel sounds Musculoskeletal:no cyanosis of digits and no clubbing  NEURO: alert & oriented x 3 with fluent speech, no focal motor/sensory deficits    LABORATORY DATA:  I have reviewed the data as listed    Latest Ref Rng & Units 01/09/2024    9:33 AM 12/26/2023   10:11 AM 12/12/2023    9:04 AM  CBC  WBC 4.0 - 10.5  K/uL 8.5  9.9  6.9   Hemoglobin 12.0 - 15.0 g/dL 16.1  09.6  04.5   Hematocrit 36.0 - 46.0 % 31.0  34.2  33.6   Platelets 150 - 400 K/uL 234  330  324         Latest Ref Rng & Units 01/09/2024    9:33 AM 12/26/2023   10:11 AM 12/12/2023    9:04 AM  CMP  Glucose 70 - 99 mg/dL 409  811  914   BUN 8 - 23 mg/dL 11  8  12    Creatinine 0.44 - 1.00 mg/dL 7.82  9.56  2.13   Sodium 135 - 145 mmol/L 141  141  134   Potassium 3.5 - 5.1 mmol/L 2.7  2.9  3.1   Chloride 98 - 111 mmol/L 107  104  96   CO2 22 - 32 mmol/L 27  32  27   Calcium 8.9 - 10.3 mg/dL 8.1  8.0  8.1   Total Protein 6.5 - 8.1 g/dL 5.2  5.3  5.4   Total Bilirubin 0.0 - 1.2 mg/dL 0.5  1.0  0.5   Alkaline Phos 38 - 126 U/L 158  248  102   AST 15 - 41 U/L 24  58  13   ALT 0 - 44 U/L 24  224  10       RADIOGRAPHIC STUDIES: I have personally reviewed the radiological images as listed and agreed with the findings in the report. No results found.    Orders Placed This Encounter  Procedures   CA 19.9    Standing Status:   Standing    Number of Occurrences:   50    Expiration Date:   01/08/2025   CBC with Differential (Cancer Center Only)    Standing Status:   Future    Expected Date:   02/06/2024    Expiration Date:   02/05/2025   CMP (Cancer Center only)    Standing Status:   Future    Expected Date:   02/06/2024    Expiration Date:   02/05/2025   CBC with Differential (Cancer Center Only)    Standing Status:   Future    Expected  Date:   02/20/2024    Expiration Date:   02/19/2025   CMP (Cancer Center only)    Standing Status:   Future    Expected Date:   02/20/2024    Expiration Date:   02/19/2025   CBC with Differential (Cancer Center Only)    Standing Status:   Future    Expected Date:   03/05/2024    Expiration Date:   03/05/2025   CMP (Cancer Center only)    Standing Status:   Future    Expected Date:   03/05/2024    Expiration Date:   03/05/2025   CBC with Differential (Cancer Center Only)    Standing Status:   Future    Expected Date:   03/19/2024    Expiration Date:   03/19/2025   CMP (Cancer Center only)    Standing Status:   Future    Expected Date:   03/19/2024    Expiration Date:   03/19/2025   CBC with Differential (Cancer Center Only)    Standing Status:   Future    Expected Date:   04/02/2024    Expiration Date:   04/02/2025   CMP (Cancer Center only)    Standing Status:   Future    Expected Date:  04/02/2024    Expiration Date:   04/02/2025   CBC with Differential (Cancer Center Only)    Standing Status:   Future    Expected Date:   04/16/2024    Expiration Date:   04/16/2025   CMP (Cancer Center only)    Standing Status:   Future    Expected Date:   04/16/2024    Expiration Date:   04/16/2025   All questions were answered. The patient knows to call the clinic with any problems, questions or concerns. No barriers to learning was detected. The total time spent in the appointment was 25 minutes.     Malachy Mood, MD 01/09/2024

## 2024-01-09 NOTE — Progress Notes (Signed)
Hypersensitivity Reaction note  Date of event: 01/09/24 Time of event: 1251 Generic name of drug involved: Irinotecan Liposomal Name of provider notified of the hypersensitivity reaction: Dr. Mosetta Putt; Daphane Shepherd, PA-C Was agent that likely caused hypersensitivity reaction added to Allergies List within EMR? Yes Chain of events including reaction signs/symptoms, treatment administered, and outcome (e.g., drug resumed; drug discontinued; sent to Emergency Department; etc.)   1251: Patient reported burning sensation in throat following taking drink of water.  Infusion stopped.  Patient then reported nausea and vomited within several seconds.  Daphane Shepherd, PA, en route.  1253: IVF (D5W) started to gravity.  PA at bedside.  See MAR for details.  1256: Pepcid IV administered.  See MAR for details.  See Flowsheets for VS.  1300: Patient reported lessening of symptoms.  See Flowsheets for VS.  1305: Patient reported resolution of symptoms.  1334: Medication restarted at previous rate per Dr. Mosetta Putt.  Patient tolerated remainder of infusion without incident.  MD aware.  Bradd Burner, RN 01/09/2024 1:20 PM

## 2024-01-10 ENCOUNTER — Other Ambulatory Visit: Payer: Self-pay

## 2024-01-11 ENCOUNTER — Inpatient Hospital Stay: Payer: BC Managed Care – PPO

## 2024-01-11 ENCOUNTER — Other Ambulatory Visit: Payer: Self-pay

## 2024-01-11 VITALS — BP 159/84 | HR 79 | Temp 97.6°F | Resp 17

## 2024-01-11 DIAGNOSIS — Z5111 Encounter for antineoplastic chemotherapy: Secondary | ICD-10-CM | POA: Diagnosis not present

## 2024-01-11 DIAGNOSIS — C250 Malignant neoplasm of head of pancreas: Secondary | ICD-10-CM

## 2024-01-11 MED ORDER — PEGFILGRASTIM-JMDB 6 MG/0.6ML ~~LOC~~ SOSY
6.0000 mg | PREFILLED_SYRINGE | Freq: Once | SUBCUTANEOUS | Status: AC
  Administered 2024-01-11: 6 mg via SUBCUTANEOUS
  Filled 2024-01-11: qty 0.6

## 2024-01-11 MED ORDER — HEPARIN SOD (PORK) LOCK FLUSH 100 UNIT/ML IV SOLN
500.0000 [IU] | Freq: Once | INTRAVENOUS | Status: AC | PRN
  Administered 2024-01-11: 500 [IU]

## 2024-01-11 MED ORDER — SODIUM CHLORIDE 0.9% FLUSH
10.0000 mL | INTRAVENOUS | Status: DC | PRN
  Administered 2024-01-11: 10 mL

## 2024-01-13 ENCOUNTER — Other Ambulatory Visit (HOSPITAL_COMMUNITY): Payer: No Typology Code available for payment source

## 2024-01-17 ENCOUNTER — Encounter: Payer: Self-pay | Admitting: Hematology

## 2024-01-22 NOTE — Assessment & Plan Note (Signed)
 stage IIB, [name], [name], [name] -Diagnosed 08/24/20 on EUS with Dr Dulce Sellar, which showed mass at head of pancreas, s/p stenting, with SMV and PV invasion, biopsy confirmed adenocarcinoma with few malignant-appearing LNs in peripancreatic and porta hepatis region. Staging was negative for metastatic disease.  -she completed neoadjuvant chemo FOLFIRINOX q2 weeks 09/12/20 - 12/29/20. -s/p whipple surgery by Dr Flonnie Hailstone on 02/08/21, path showed no residual disease.  -due to the high recurrence risk of pancreatic cancer, she is under close surveillance.  -Her PET scan from September 30, 2023 showed hypermetabolic soft tissue mass at the previous Whipple surgical site, and a small mildly hypermetabolic nodule in the left lung, concerning for metastatic recurrence. -lung biopsy 10/28/2023 confirmed adenocarcinoma, IHC consistent with metastatic pancreatic ca.  -she started chemo NALIRIFOX  on 11/14/23, had a significant diarrhea, required dose reduction.

## 2024-01-23 ENCOUNTER — Inpatient Hospital Stay: Payer: BC Managed Care – PPO | Attending: Hematology

## 2024-01-23 ENCOUNTER — Inpatient Hospital Stay (HOSPITAL_BASED_OUTPATIENT_CLINIC_OR_DEPARTMENT_OTHER): Payer: BC Managed Care – PPO | Admitting: Hematology

## 2024-01-23 ENCOUNTER — Encounter: Payer: Self-pay | Admitting: Hematology

## 2024-01-23 ENCOUNTER — Inpatient Hospital Stay: Payer: BC Managed Care – PPO

## 2024-01-23 VITALS — BP 122/78 | HR 86 | Temp 98.5°F | Resp 17 | Wt 117.0 lb

## 2024-01-23 DIAGNOSIS — E876 Hypokalemia: Secondary | ICD-10-CM | POA: Diagnosis not present

## 2024-01-23 DIAGNOSIS — Z79899 Other long term (current) drug therapy: Secondary | ICD-10-CM | POA: Diagnosis not present

## 2024-01-23 DIAGNOSIS — Z90411 Acquired partial absence of pancreas: Secondary | ICD-10-CM | POA: Insufficient documentation

## 2024-01-23 DIAGNOSIS — F32A Depression, unspecified: Secondary | ICD-10-CM | POA: Insufficient documentation

## 2024-01-23 DIAGNOSIS — C250 Malignant neoplasm of head of pancreas: Secondary | ICD-10-CM | POA: Insufficient documentation

## 2024-01-23 DIAGNOSIS — C7800 Secondary malignant neoplasm of unspecified lung: Secondary | ICD-10-CM | POA: Diagnosis not present

## 2024-01-23 DIAGNOSIS — Z5111 Encounter for antineoplastic chemotherapy: Secondary | ICD-10-CM | POA: Diagnosis present

## 2024-01-23 DIAGNOSIS — Z95828 Presence of other vascular implants and grafts: Secondary | ICD-10-CM

## 2024-01-23 DIAGNOSIS — F419 Anxiety disorder, unspecified: Secondary | ICD-10-CM | POA: Diagnosis not present

## 2024-01-23 LAB — CMP (CANCER CENTER ONLY)
ALT: 18 U/L (ref 0–44)
AST: 23 U/L (ref 15–41)
Albumin: 3.3 g/dL — ABNORMAL LOW (ref 3.5–5.0)
Alkaline Phosphatase: 128 U/L — ABNORMAL HIGH (ref 38–126)
Anion gap: 6 (ref 5–15)
BUN: 10 mg/dL (ref 8–23)
CO2: 29 mmol/L (ref 22–32)
Calcium: 8.1 mg/dL — ABNORMAL LOW (ref 8.9–10.3)
Chloride: 107 mmol/L (ref 98–111)
Creatinine: 0.84 mg/dL (ref 0.44–1.00)
GFR, Estimated: >60 mL/min (ref >60)
Glucose, Bld: 109 mg/dL — ABNORMAL HIGH (ref 70–99)
Potassium: 3 mmol/L — ABNORMAL LOW (ref 3.5–5.1)
Sodium: 142 mmol/L (ref 135–145)
Total Bilirubin: 0.5 mg/dL (ref 0.0–1.2)
Total Protein: 5.6 g/dL — ABNORMAL LOW (ref 6.5–8.1)

## 2024-01-23 LAB — CBC WITH DIFFERENTIAL (CANCER CENTER ONLY)
Abs Immature Granulocytes: 0.22 10*3/uL — ABNORMAL HIGH (ref 0.00–0.07)
Basophils Absolute: 0 10*3/uL (ref 0.0–0.1)
Basophils Relative: 0 %
Eosinophils Absolute: 0.1 10*3/uL (ref 0.0–0.5)
Eosinophils Relative: 1 %
HCT: 29.6 % — ABNORMAL LOW (ref 36.0–46.0)
Hemoglobin: 9.6 g/dL — ABNORMAL LOW (ref 12.0–15.0)
Immature Granulocytes: 3 %
Lymphocytes Relative: 22 %
Lymphs Abs: 1.5 10*3/uL (ref 0.7–4.0)
MCH: 29.9 pg (ref 26.0–34.0)
MCHC: 32.4 g/dL (ref 30.0–36.0)
MCV: 92.2 fL (ref 80.0–100.0)
Monocytes Absolute: 0.8 10*3/uL (ref 0.1–1.0)
Monocytes Relative: 11 %
Neutro Abs: 4.3 10*3/uL (ref 1.7–7.7)
Neutrophils Relative %: 63 %
Platelet Count: 188 10*3/uL (ref 150–400)
RBC: 3.21 MIL/uL — ABNORMAL LOW (ref 3.87–5.11)
RDW: 20 % — ABNORMAL HIGH (ref 11.5–15.5)
WBC Count: 6.9 10*3/uL (ref 4.0–10.5)
nRBC: 0 % (ref 0.0–0.2)

## 2024-01-23 MED ORDER — DEXTROSE 5 % IV SOLN
400.0000 mg/m2 | Freq: Once | INTRAVENOUS | Status: AC
  Administered 2024-01-23: 648 mg via INTRAVENOUS
  Filled 2024-01-23: qty 32.4

## 2024-01-23 MED ORDER — ATROPINE SULFATE 1 MG/ML IV SOLN
0.5000 mg | Freq: Once | INTRAVENOUS | Status: AC | PRN
  Administered 2024-01-23: 0.5 mg via INTRAVENOUS
  Filled 2024-01-23: qty 1

## 2024-01-23 MED ORDER — SODIUM CHLORIDE 0.9 % IV SOLN
35.0000 mg/m2 | Freq: Once | INTRAVENOUS | Status: AC
  Administered 2024-01-23: 55.9 mg via INTRAVENOUS
  Filled 2024-01-23: qty 13

## 2024-01-23 MED ORDER — DEXTROSE 5 % IV SOLN: INTRAVENOUS | Status: DC

## 2024-01-23 MED ORDER — DEXTROSE 5 % IV SOLN
50.0000 mg/m2 | Freq: Once | INTRAVENOUS | Status: AC
  Administered 2024-01-23: 80 mg via INTRAVENOUS
  Filled 2024-01-23: qty 16

## 2024-01-23 MED ORDER — DEXAMETHASONE SODIUM PHOSPHATE 10 MG/ML IJ SOLN
10.0000 mg | Freq: Once | INTRAMUSCULAR | Status: AC
  Administered 2024-01-23: 10 mg via INTRAVENOUS
  Filled 2024-01-23: qty 1

## 2024-01-23 MED ORDER — FAMOTIDINE IN NACL 20-0.9 MG/50ML-% IV SOLN
20.0000 mg | Freq: Once | INTRAVENOUS | Status: AC
  Administered 2024-01-23: 20 mg via INTRAVENOUS
  Filled 2024-01-23: qty 50

## 2024-01-23 MED ORDER — SODIUM CHLORIDE 0.9 % IV SOLN
2400.0000 mg/m2 | INTRAVENOUS | Status: DC
  Administered 2024-01-23: 3900 mg via INTRAVENOUS
  Filled 2024-01-23: qty 78

## 2024-01-23 MED ORDER — PALONOSETRON HCL INJECTION 0.25 MG/5ML
0.2500 mg | Freq: Once | INTRAVENOUS | Status: AC
  Administered 2024-01-23: 0.25 mg via INTRAVENOUS
  Filled 2024-01-23: qty 5

## 2024-01-23 MED ORDER — SODIUM CHLORIDE 0.9% FLUSH
10.0000 mL | INTRAVENOUS | Status: DC | PRN
  Administered 2024-01-23: 10 mL

## 2024-01-23 MED ORDER — HEPARIN SOD (PORK) LOCK FLUSH 100 UNIT/ML IV SOLN
500.0000 [IU] | Freq: Once | INTRAVENOUS | Status: AC | PRN
  Administered 2024-01-23: 500 [IU]

## 2024-01-23 MED ORDER — LORATADINE 10 MG PO TABS
10.0000 mg | ORAL_TABLET | Freq: Once | ORAL | Status: AC
  Administered 2024-01-23: 10 mg via ORAL
  Filled 2024-01-23: qty 1

## 2024-01-23 NOTE — Patient Instructions (Signed)
 [facility] - A DEPT OF [facility]  Discharge Instructions: Thank you for choosing [facility] to provide your oncology and hematology care.   If you have a lab appointment with the Cancer Center, please go directly to the Cancer Center and check in at the registration area.   Wear comfortable clothing and clothing appropriate for easy access to any Portacath or PICC line.   We strive to give you quality time with your provider. You may need to reschedule your appointment if you arrive late (15 or more minutes).  Arriving late affects you and other patients whose appointments are after yours.  Also, if you miss three or more appointments without notifying the office, you may be dismissed from the clinic at the provider's discretion.      For prescription refill requests, have your pharmacy contact our office and allow 72 hours for refills to be completed.    Today you received the following chemotherapy and/or immunotherapy agents: Irinotecan liposomal, Oxaliplatin, Leucovorin, 5FU      To help prevent nausea and vomiting after your treatment, we encourage you to take your nausea medication as directed.  BELOW ARE SYMPTOMS THAT SHOULD BE REPORTED IMMEDIATELY: *FEVER GREATER THAN 100.4 F (38 C) OR HIGHER *CHILLS OR SWEATING *NAUSEA AND VOMITING THAT IS NOT CONTROLLED WITH YOUR NAUSEA MEDICATION *UNUSUAL SHORTNESS OF BREATH *UNUSUAL BRUISING OR BLEEDING *URINARY PROBLEMS (pain or burning when urinating, or frequent urination) *BOWEL PROBLEMS (unusual diarrhea, constipation, pain near the anus) TENDERNESS IN MOUTH AND THROAT WITH OR WITHOUT PRESENCE OF ULCERS (sore throat, sores in mouth, or a toothache) UNUSUAL RASH, SWELLING OR PAIN  UNUSUAL VAGINAL DISCHARGE OR ITCHING   Items with * indicate a potential emergency and should be followed up as soon as possible or go to the Emergency Department if any problems should occur.  Please show the  CHEMOTHERAPY ALERT CARD or IMMUNOTHERAPY ALERT CARD at check-in to the Emergency Department and triage nurse.  Should you have questions after your visit or need to cancel or reschedule your appointment, please contact [facility] - A DE[facility]  Dept: [phone]  and follow the prompts.  Office hours are 8:00 a.m. to 4:30 p.m. Monday - Friday. Please note that voicemails left after 4:00 p.m. may not be returned until the following business day.  We are closed weekends and major holidays. You have access to a nurse at all times for urgent questions. Please call the main number to the clinic Dept: [phone] and follow the prompts.   For any non-urgent questions, you may also contact your provider using MyChart. We now offer e-Visits for anyone 61 and older to request care online for non-urgent symptoms. For details visit [email or web address].   Also download the MyChart app! Go to the app store, search "MyChart", open the app, select [name], and log in with your MyChart username and password.  Hypokalemia Hypokalemia means that the amount of potassium in the blood is lower than normal. Potassium is a mineral (electrolyte) that helps regulate the amount of fluid in the body. It also stimulates muscle tightening (contraction) and helps nerves work properly. Normally, most of the body's potassium is inside cells, and only a very small amount is in the blood. Because the amount in the blood is so small, minor changes to potassium levels in the blood can be life-threatening. What are the causes? This condition may be  caused by: Antibiotic medicine. Diarrhea or vomiting. Taking too much of a medicine that helps you have a bowel movement (laxative) can cause diarrhea and lead to hypokalemia. Chronic kidney disease (CKD). Medicines that help the body get rid of excess fluid (diuretics). Eating disorders, such as anorexia or bulimia. Low magnesium levels  in the body. Sweating a lot. What are the signs or symptoms? Symptoms of this condition include: Weakness. Constipation. Fatigue. Muscle cramps. Mental confusion. Skipped heartbeats or irregular heartbeat (palpitations). Tingling or numbness. How is this diagnosed? This condition is diagnosed with a blood test. How is this treated? This condition may be treated by: Taking potassium supplements. Adjusting the medicines that you take. Eating more foods that contain a lot of potassium. If your potassium level is very low, you may need to get potassium through an IV and be monitored in the hospital. Follow these instructions at home: Eating and drinking  Eat a healthy diet. A healthy diet includes fresh fruits and vegetables, whole grains, healthy fats, and lean proteins. If told, eat more foods that contain a lot of potassium. These include: Nuts, such as peanuts and pistachios. Seeds, such as sunflower seeds and pumpkin seeds. Peas, lentils, and lima beans. Whole grain and bran cereals and breads. Fresh fruits and vegetables, such as apricots, avocado, bananas, cantaloupe, kiwi, oranges, tomatoes, asparagus, and potatoes. Juices, such as orange, tomato, and prune. Lean meats, including fish. Milk and milk products, such as yogurt. General instructions Take over-the-counter and prescription medicines only as told by your health care provider. This includes vitamins, natural food products, and supplements. Keep all follow-up visits. This is important. Contact a health care provider if: You have weakness that gets worse. You feel your heart pounding or racing. You vomit. You have diarrhea. You have diabetes and you have trouble keeping your blood sugar in your target range. Get help right away if: You have chest pain. You have shortness of breath. You have vomiting or diarrhea that lasts for more than 2 days. You faint. These symptoms may be an emergency. Get help right away.  Call 911. Do not wait to see if the symptoms will go away. Do not drive yourself to the hospital. Summary Hypokalemia means that the amount of potassium in the blood is lower than normal. This condition is diagnosed with a blood test. Hypokalemia may be treated by taking potassium supplements, adjusting the medicines that you take, or eating more foods that are high in potassium. If your potassium level is very low, you may need to get potassium through an IV and be monitored in the hospital. This information is not intended to replace advice given to you by your health care provider. Make sure you discuss any questions you have with your health care provider. Document Revised: 07/20/2021 Document Reviewed: 07/20/2021 Elsevier Patient Education  2024 [employer].

## 2024-01-23 NOTE — Progress Notes (Signed)
 [facility]   [phone] Fax:([phone]   Clinic Follow up Note   Patient Care Team: Leola Brazil, DO as PCP - General (Internal Medicine) Malachy Mood, MD as Consulting Physician (Oncology) Fritzi Mandes, MD as Consulting Physician (General Surgery)  Date of Service:  01/23/2024  CHIEF COMPLAINT: f/u of pancreatic cancer  CURRENT THERAPY:  First-line chemotherapy nalirifox  Oncology History   Pancreatic cancer [facility]) stage IIB, [name], [name], [name] -Diagnosed 08/24/20 on EUS with Dr Dulce Sellar, which showed mass at head of pancreas, s/p stenting, with SMV and PV invasion, biopsy confirmed adenocarcinoma with few malignant-appearing LNs in peripancreatic and porta hepatis region. Staging was negative for metastatic disease.  -she completed neoadjuvant chemo FOLFIRINOX q2 weeks 09/12/20 - 12/29/20. -s/p whipple surgery by Dr Flonnie Hailstone on 02/08/21, path showed no residual disease.  -due to the high recurrence risk of pancreatic cancer, she is under close surveillance.  -Her PET scan from September 30, 2023 showed hypermetabolic soft tissue mass at the previous Whipple surgical site, and a small mildly hypermetabolic nodule in the left lung, concerning for metastatic recurrence. -lung biopsy 10/28/2023 confirmed adenocarcinoma, IHC consistent with metastatic pancreatic ca.  -she started chemo NALIRIFOX  on 11/14/23, had a significant diarrhea, required dose reduction.   Assessment and Plan    Recurrent Pancreatic Cancer Follow-up for recurrent pancreatic cancer. Reports one episode of vomiting post-treatment, otherwise well. Manages fatigue post-treatment. Awaiting CT scan on March 18th to assess cancer status. Blood counts stable: hemoglobin 9.6, normal white count and platelets. Tolerating reduced chemotherapy dose better. Noted weight loss; encouraged to maintain or gain weight. On potassium supplementation  and takes Boost daily. Discussed that chemotherapy controls but does not cure cancer; stopping treatment likely leads to progression. Radiation therapy considered if applicable. - Continue current chemotherapy regimen - Order CT scan on March 18th - Schedule next treatment on March 20th - Monitor weight and increase Boost intake to two bottles daily - Request dietician follow-up - Complete and sign disability paperwork upon receipt - Discuss potential for radiation therapy if applicable - Follow-up in two weeks  Chemotherapy-Induced Nausea and Vomiting Experienced one episode of vomiting post-chemotherapy. Has adequate anti-nausea medication at home. - Ensure adequate supply of anti-nausea medication  Fatigue Reports manageable fatigue post-chemotherapy, able to perform most daily activities. - Monitor fatigue levels and adjust treatment as necessary  Hypokalemia On potassium supplementation, taking four tablets daily. - Continue current potassium supplementation regimen  General Health Maintenance Encouraged to maintain or gain weight, taking Boost daily. - Increase Boost intake to two bottles daily - Request dietician follow-up  Plan -Lab reviewed, adequate for treatment, will proceed to chemo today at the same reduced dose. -She is scheduled for CT scan on March 18th - Schedule next treatment on March 20th - Follow-up in two weeks.         SUMMARY OF ONCOLOGIC HISTORY: Oncology History Overview Note  Cancer Staging Pancreatic cancer [facility]) Staging form: Exocrine Pancreas, AJCC 8th Edition - Clinical stage from 08/24/2020: Stage IIB (cT2, cN1, cM0) - Signed by Malachy Mood, MD on 08/30/2020 Stage prefix: Initial diagnosis    Pancreatic cancer (HCC)  08/20/2020 Imaging   CT AP 08/20/20  IMPRESSION: 1. 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 This mass appears to be leading to occlusion of the superior mesenteric vein as  well as the proximal portion of the main portal  vein. Collateral circulation is noted. There is moderate intrahepatic and extrahepatic biliary dilatation which appears to be due to the pancreatic head mass. Pancreatic ductal dilatation is noted as well. 2. Probable 2.7 cm uterine fibroid. 3. Aortic atherosclerosis.   Aortic Atherosclerosis (ICD10-I70.0).   08/20/2020 Tumor Marker   Ca19-9 - 927   08/22/2020 Procedure   ERCP by Dr Ewing Schlein 08/22/20  IMPRESSION - The major papilla appeared normal. - A biliary sphincterotomy was performed. - Cells for cytology obtained in the lower third and middle of the main duct. - One plastic stent was placed into the common bile duct.   FINAL MICROSCOPIC DIAGNOSIS:  - No malignant cells identified  - Benign reactive/reparative changes   08/24/2020 Cancer Staging   Staging form: Exocrine Pancreas, AJCC 8th Edition - Clinical stage from 08/24/2020: Stage IIB (cT2, cN1, cM0) - Signed by Malachy Mood, MD on 08/30/2020   08/24/2020 Procedure   EUS by Dr Dulce Sellar 08/24/20  IMPRESSION - There was no sign of significant pathology in the ampulla. - A few malignant-appearing lymph nodes were visualized in the peripancreatic region and porta hepatis region. - One stent was visualized endosonographically in the common bile duct. - A mass was identified in the pancreatic head. Tissue was obtained from this exam. The preliminary diagnosis is consistent with adenocarcinoma. Invasion into SMV/PV seen. Lymphadenopathy noted. This was staged T3 N1 Mx by endosonographic criteria. Fine needle aspiration performed.   08/24/2020 Initial Biopsy   FINAL MICROSCOPIC DIAGNOSIS: 08/24/20 - Malignant cells consistent with adenocarcinoma    08/30/2020 Initial Diagnosis   Pancreatic cancer (HCC)   09/05/2020 Imaging   CT Chest  IMPRESSION: 1. Stable 2.7 cm infiltrating pancreatic head mass with borderline enlarged peripancreatic lymph nodes. 2. No findings for pulmonary  metastatic disease. 3. Small hiatal hernia. 4. Aortic atherosclerosis.   Aortic Atherosclerosis (ICD10-I70.0).   09/08/2020 Procedure   INSERTION PORT-A-CATH by Dr Freida Busman and Donell Beers    09/12/2020 - 12/29/2020 Chemotherapy   Neoadjuvant FOLFIRINOX q2 weeks starting 09/12/20-12/29/20    12/02/2020 Imaging   CT CAP  IMPRESSION: 1. Previously noted mass of the central pancreatic head is almost entirely resolved, difficult to discretely appreciate on current examination. Findings are consistent with treatment response. There remains obstruction of the pancreatic duct near the head neck junction with mild prominence of the pancreatic duct, measuring up to 5 mm, with atrophy of the distal pancreatic parenchyma. 2. The portal vein, splenic vein, and superior mesenteric vein are now widely patent, previously effaced at the confluence by mass effect. 3. Interval placement of common bile duct stent, tip positioned in the distal duodenum, with relief of previously seen biliary ductal dilatation. 4. 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 Following the bifurcation of the celiac mesenteric trunk, the celiac axis appears to directly traverse the vicinity of the mass, although there does appear to be a fat plane about the vessel. 5. The distal small bowel and colon are diffusely somewhat hyperenhancing and inflamed appearing with vascular combing and a tethered appearance of the distal small bowel. This appearance generally suggests inflammatory bowel disease such as Crohn's disease. Correlate with referable clinical history, if present. No evidence of obstruction or other acute complication. 6. Hepatic steatosis. 7. Aortic atherosclerosis.   12/28/2020 Genetic Testing   Negative hereditary cancer genetic testing: no pathogenic  variants detected in [employer] Common Hereditary  Cancers Panel.  The report date is December 28, 2020.    The Common Hereditary Cancers Panel offered by [employer] includes sequencing and/or deletion duplication testing of the following 47 genes: APC, ATM, AXIN2, BARD1, BMPR1A, BRCA1, BRCA2, BRIP1, CDH1, CDK4, CDKN2A (p14ARF), CDKN2A (p16INK4a), CHEK2, CTNNA1, DICER1, EPCAM (Deletion/duplication testing only), GREM1 (promoter region deletion/duplication testing only), GREM1, HOXB13, KIT, MEN1, MLH1, MSH2, MSH3, MSH6, MUTYH, NBN, NF1, NHTL1, PALB2, PDGFRA, PMS2, POLD1, POLE, PTEN, RAD50, RAD51C, RAD51D, SDHA, SDHB, SDHC, SDHD, SMAD4, SMARCA4. STK11, TP53, TSC1, TSC2, and VHL.  The following genes were evaluated for sequence changes only: SDHA and HOXB13 c.251G>A variant only.   01/17/2021 Imaging   CT CAP from [facility] IMPRESSION Small lesion in the pancreatic head measuring approximately 1.1 x 0.8 cm without evidence of vascular involvement or metastasis consistent with previously described, biopsy-proven pancreatic adenocarcinoma.   02/08/2021 Surgery   Whipple Surgery with Dr Deveron Furlong  Final Pathologic Diagnosis      A.  GALLBLADDER, CHOLECYSTECTOMY: Chronic cholecystitis. No malignancy identified.   B.  BILE DUCT STENT, REMOVAL (GROSS ONLY DIAGNOSIS): Stent, see gross description.   C.  WHIPPLE RESECTION: No residual malignancy identified. Chronic and acute inflammation with granulation tissue reaction, fibrosis and features of chronic pancreatitis, suggestive of therapy effect. Fourteen lymph nodes, negative for metastasis (0/14). Margins negative for malignancy.        06/07/2021 Imaging   CT AP  IMPRESSION: 1. No current findings of recurrent malignancy. Interval Whipple procedure with expected postoperative findings. 2. A 3.5 cm stent is present in the dorsal pancreatic duct. No duct dilatation. There is an approximately 1.5 mm lucency centrally along this stent shown on image 43  series 7, possibilities include stent fracture, two separate stents, or an intentional radial lucency in this type of stent. 3. Small type 1 hiatal hernia. Mild distal esophageal wall thickening may reflect low-grade esophagitis. 4.  Aortic Atherosclerosis (ICD10-I70.0). 5.  Prominent stool throughout the colon favors constipation. 6. Uterine fibroid. 7. 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   11/14/2023 -  Chemotherapy   Patient is on Treatment Plan : PANCREAS NALIRIFOX D1, 15 Q28D     Port-A-Cath in place     Discussed the use of AI scribe software for clinical note transcription with the patient, who gave verbal consent to proceed.  History of Present Illness   The patient, with a history of recurrent pancreatic cancer, presents for a follow-up visit. She reports feeling good overall, with each day getting "better and better." She experienced vomiting during the last treatment, which she attributes to eating a sandwich. She denies experiencing pain or diarrhea. She reports fatigue post-treatment but describes it as "not too bad," and she is still able to do most things she wants to do. She is currently taking potassium four times a day and has enough nausea medication at home. She also consumes one bottle of Boost daily and is open to increasing the intake. She is awaiting a CT scan and another treatment session. She has been in contact with [employer] regarding her leave and disability and is awaiting paperwork. She expresses strong faith and a positive outlook, expecting the cancer to be gone.         All other systems were reviewed with the patient and are negative.  MEDICAL HISTORY:  Past Medical History:  Diagnosis Date   Anemia    Anxiety    Cancer (HCC) 07/2020   pancreatic cancer   Depression  Diabetes mellitus without complication (HCC) 07/2020   pancreatic cancer   High cholesterol    History of blood transfusion     History of hiatal hernia    small   Hypertension    Stroke [facility])    age 78, no residual effect    SURGICAL HISTORY: Past Surgical History:  Procedure Laterality Date   BILIARY BRUSHING  08/22/2020   Procedure: BILIARY BRUSHING;  Surgeon: Vida Rigger, MD;  Location: [facility] ENDOSCOPY;  Service: Endoscopy;;   BILIARY STENT PLACEMENT N/A 08/22/2020   Procedure: BILIARY STENT PLACEMENT;  Surgeon: Vida Rigger, MD;  Location: [facility];  Service: Endoscopy;  Laterality: N/A;   BRONCHIAL BIOPSY  10/28/2023   Procedure: BRONCHIAL BIOPSIES;  Surgeon: Leslye Peer, MD;  Location: [facility] ENDOSCOPY;  Service: Pulmonary;;   BRONCHIAL BRUSHINGS  10/28/2023   Procedure: BRONCHIAL BRUSHINGS;  Surgeon: Leslye Peer, MD;  Location: [facility] ENDOSCOPY;  Service: Pulmonary;;   BRONCHIAL NEEDLE ASPIRATION BIOPSY  10/28/2023   Procedure: BRONCHIAL NEEDLE ASPIRATION BIOPSIES;  Surgeon: Leslye Peer, MD;  Location: [facility] ENDOSCOPY;  Service: Pulmonary;;   ENDOSCOPIC RETROGRADE CHOLANGIOPANCREATOGRAPHY (ERCP) WITH PROPOFOL N/A 08/22/2020   Procedure: ENDOSCOPIC RETROGRADE CHOLANGIOPANCREATOGRAPHY (ERCP) WITH PROPOFOL;  Surgeon: Vida Rigger, MD;  Location: [facility];  Service: Endoscopy;  Laterality: N/A;   ESOPHAGOGASTRODUODENOSCOPY (EGD) WITH PROPOFOL N/A 08/24/2020   Procedure: ESOPHAGOGASTRODUODENOSCOPY (EGD) WITH PROPOFOL;  Surgeon: Willis Modena, MD;  Location: [facility];  Service: Endoscopy;  Laterality: N/A;   FIDUCIAL MARKER PLACEMENT  10/28/2023   Procedure: FIDUCIAL MARKER PLACEMENT;  Surgeon: Leslye Peer, MD;  Location: [facility] ENDOSCOPY;  Service: Pulmonary;;   FINE NEEDLE ASPIRATION N/A 08/24/2020   Procedure: FINE NEEDLE ASPIRATION (FNA) LINEAR;  Surgeon: Willis Modena, MD;  Location: [facility]OS[facility];  Service: Endoscopy;  Laterality: N/A;   PORT-A-CATH REMOVAL N/A 12/08/2021   Procedure: REMOVAL PORT-A-CATH;  Surgeon: Fritzi Mandes, MD;  Location: [facility] OR;  Service: General;  Laterality: N/A;    PORTACATH PLACEMENT Right 09/08/2020   Procedure: INSERTION PORT-A-CATH;  Surgeon: Fritzi Mandes, MD;  Location: [facility];  Service: General;  Laterality: Right;   PORTACATH PLACEMENT N/A 11/06/2023   Procedure: INSERTION PORT-A-CATH;  Surgeon: Fritzi Mandes, MD;  Location: [facility] ORS;  Service: General;  Laterality: N/A;  LMA   SPHINCTEROTOMY  08/22/2020   Procedure: Dennison Mascot;  Surgeon: Vida Rigger, MD;  Location: [facility];  Service: Endoscopy;;   UPPER ESOPHAGEAL ENDOSCOPIC ULTRASOUND (EUS) N/A 08/24/2020   Procedure: UPPER ESOPHAGEAL ENDOSCOPIC ULTRASOUND (EUS);  Surgeon: Willis Modena, MD;  Location: [facility];  Service: Endoscopy;  Laterality: N/A;    I have reviewed the social history and family history with the patient and they are unchanged from previous note.  ALLERGIES:  is allergic to irinotecan liposome, tyloxapol, oxycodone-acetaminophen, and poison ivy extract.  MEDICATIONS:  Current Outpatient Medications  Medication Sig Dispense Refill   acetaminophen (TYLENOL) 325 MG tablet Take 650 mg by mouth every 6 (six) hours as needed for moderate pain (pain score 4-6).     busPIRone (BUSPAR) 5 MG tablet Take 5 mg by mouth daily.     citalopram (CELEXA) 40 MG tablet Take 40 mg by mouth every evening.     CREON 36000-114000 units CPEP capsule TAKE 2 CAPSULES BY MOUTH 3 TIMES A DAY WITH A MEAL. MAY ALSO TAKE 1 CAPSULE AS NEEDED WITH SNACKS 240 capsule 2   dexamethasone (DECADRON) 4 MG tablet Take 2 tablets (8 mg total) by mouth daily. Start the  day after irinotecan chemotherapy for 2 days. Take with food. 8 tablet 5   hydrochlorothiazide (MICROZIDE) 12.5 MG capsule Take 12.5 mg by mouth in the morning.     lidocaine-prilocaine (EMLA) cream Apply to affected area once 30 g 3   magnesium oxide (MAG-OX) 400 (241.3 Mg) MG tablet Take 1 tablet (400 mg total) by mouth every evening.     mirtazapine (REMERON) 7.5 MG tablet TAKE 1 TABLET BY MOUTH AT BEDTIME. 90  tablet 1   ondansetron (ZOFRAN) 8 MG tablet Take 1 tablet (8 mg total) by mouth every 8 (eight) hours as needed for nausea or vomiting. Start on the third day after irinotecan 30 tablet 1   oxyCODONE-acetaminophen (PERCOCET/ROXICET) 5-325 MG tablet Take 1 tablet by mouth every 6 (six) hours as needed for severe pain (pain score 7-10). 12 tablet 0   potassium chloride (KLOR-CON) 10 MEQ tablet TAKE 1 TABLET BY MOUTH EVERY DAY (Patient taking differently: Take 10 mEq by mouth 2 (two) times daily.) 90 tablet 1   potassium chloride SA (KLOR-CON M) 20 MEQ tablet Take 1 tablet (20 mEq total) by mouth 2 (two) times daily. 60 tablet 1   prochlorperazine (COMPAZINE) 10 MG tablet Take 1 tablet (10 mg total) by mouth every 6 (six) hours as needed for nausea or vomiting. 30 tablet 2   simvastatin (ZOCOR) 80 MG tablet Take 80 mg by mouth every evening.     No current facility-administered medications for this visit.   Facility-Administered Medications Ordered in Other Visits  Medication Dose Route Frequency Provider Last Rate Last Admin   dextrose 5 % solution   Intravenous Continuous Malachy Mood, MD   Stopped at 01/23/24 1620   fluorouracil (ADRUCIL) 3,900 mg in sodium chloride 0.9 % 72 mL chemo infusion  2,400 mg/m2 (Treatment Plan Recorded) Intravenous 1 day or 1 dose Malachy Mood, MD   Infusion Verify at 01/23/24 1640   sodium chloride flush (NS) 0.9 % injection 10 mL  10 mL Intracatheter PRN Malachy Mood, MD   10 mL at 01/23/24 1620    PHYSICAL EXAMINATION: ECOG PERFORMANCE STATUS: 1 - Symptomatic but completely ambulatory  Vitals:   01/23/24 1029  BP: 122/78  Pulse: 86  Resp: 17  Temp: 98.5 F (36.9 C)  SpO2: 95%   Wt Readings from Last 3 Encounters:  01/23/24 117 lb (53.1 kg)  01/09/24 120 lb 8 oz (54.7 kg)  12/26/23 122 lb 4.8 oz (55.5 kg)     GENERAL:alert, no distress and comfortable SKIN: skin color, texture, turgor are normal, no rashes or significant lesions EYES: normal, Conjunctiva are  pink and non-injected, sclera clear NECK: supple, thyroid normal size, non-tender, without nodularity LYMPH:  no palpable lymphadenopathy in the cervical, axillary  LUNGS: clear to auscultation and percussion with normal breathing effort HEART: regular rate & rhythm and no murmurs and no lower extremity edema ABDOMEN:abdomen soft, non-tender and normal bowel sounds Musculoskeletal:no cyanosis of digits and no clubbing  NEURO: alert & oriented x 3 with fluent speech, no focal motor/sensory deficits   LABORATORY DATA:  I have reviewed the data as listed    Latest Ref Rng & Units 01/23/2024    9:55 AM 01/09/2024    9:33 AM 12/26/2023   10:11 AM  CBC  WBC 4.0 - 10.5 K/uL 6.9  8.5  9.9   Hemoglobin 12.0 - 15.0 g/dL 9.6  11.9  14.7   Hematocrit 36.0 - 46.0 % 29.6  31.0  34.2   Platelets 150 -  400 K/uL 188  234  330         Latest Ref Rng & Units 01/23/2024    9:55 AM 01/09/2024    9:33 AM 12/26/2023   10:11 AM  CMP  Glucose 70 - 99 mg/dL 914  782  956   BUN 8 - 23 mg/dL 10  11  8    Creatinine 0.44 - 1.00 mg/dL 2.13  0.86  5.78   Sodium 135 - 145 mmol/L 142  141  141   Potassium 3.5 - 5.1 mmol/L 3.0  2.7  2.9   Chloride 98 - 111 mmol/L 107  107  104   CO2 22 - 32 mmol/L 29  27  32   Calcium 8.9 - 10.3 mg/dL 8.1  8.1  8.0   Total Protein 6.5 - 8.1 g/dL 5.6  5.2  5.3   Total Bilirubin 0.0 - 1.2 mg/dL 0.5  0.5  1.0   Alkaline Phos 38 - 126 U/L 128  158  248   AST 15 - 41 U/L 23  24  58   ALT 0 - 44 U/L 18  24  224       RADIOGRAPHIC STUDIES: I have personally reviewed the radiological images as listed and agreed with the findings in the report. No results found.    No orders of the defined types were placed in this encounter.  All questions were answered. The patient knows to call the clinic with any problems, questions or concerns. No barriers to learning was detected. The total time spent in the appointment was 25 minutes.     Malachy Mood, MD 01/23/2024

## 2024-01-24 LAB — CANCER ANTIGEN 19-9: CA 19-9: 7 U/mL (ref 0–35)

## 2024-01-25 ENCOUNTER — Ambulatory Visit: Payer: BC Managed Care – PPO

## 2024-01-25 ENCOUNTER — Inpatient Hospital Stay: Payer: BC Managed Care – PPO

## 2024-01-25 VITALS — BP 131/75 | HR 88 | Temp 98.3°F | Resp 16

## 2024-01-25 DIAGNOSIS — Z5111 Encounter for antineoplastic chemotherapy: Secondary | ICD-10-CM | POA: Diagnosis not present

## 2024-01-25 DIAGNOSIS — C250 Malignant neoplasm of head of pancreas: Secondary | ICD-10-CM

## 2024-01-25 MED ORDER — HEPARIN SOD (PORK) LOCK FLUSH 100 UNIT/ML IV SOLN
500.0000 [IU] | Freq: Once | INTRAVENOUS | Status: AC | PRN
  Administered 2024-01-25: 500 [IU]

## 2024-01-25 MED ORDER — SODIUM CHLORIDE 0.9% FLUSH
10.0000 mL | INTRAVENOUS | Status: DC | PRN
  Administered 2024-01-25: 10 mL

## 2024-01-25 MED ORDER — PEGFILGRASTIM-JMDB 6 MG/0.6ML ~~LOC~~ SOSY
6.0000 mg | PREFILLED_SYRINGE | Freq: Once | SUBCUTANEOUS | Status: AC
Start: 2024-01-25 — End: 2024-01-25
  Administered 2024-01-25: 6 mg via SUBCUTANEOUS

## 2024-02-02 ENCOUNTER — Other Ambulatory Visit: Payer: Self-pay | Admitting: Hematology

## 2024-02-03 ENCOUNTER — Telehealth: Payer: Self-pay

## 2024-02-03 ENCOUNTER — Other Ambulatory Visit: Payer: Self-pay | Admitting: Nurse Practitioner

## 2024-02-03 DIAGNOSIS — C250 Malignant neoplasm of head of pancreas: Secondary | ICD-10-CM

## 2024-02-03 NOTE — Telephone Encounter (Signed)
 Pt called requesting if Dr. Mosetta Putt could prescribe her something stronger for her anxiety and depression.  Pt stated she's having increased anxiety and fatigue.  Pt stated she's very depressed and doesn't have the motivation to get up and do things around her house.  Pt stated she's very weak and tired.  Pt also stated that she stopped taking the Buspirone (buspar) several months ago but restarted the Buspirone on 02/02/2024.  Pt stated the Buspirone is not working nor is the Citalopram (Celexa).  Stated since the pt just restarted the Buspirone on 02/02/2024, it takes up to 72 hours before medication is in pt's system to start feeling some relief.  Pt also stated that she doubled up on the Citalopram (Celexa) which this nurse instructed pt not to do without consulting with a provider.  Pt verbalized understanding.  Asked pt if she would like to speak with a mental health provider regarding her symptoms.  Pt was agreeable to seeing a mental health provider.  Stated this nurse will make Dr. Mosetta Putt and her Team aware of the pt's call.  Pt verbalized understanding and had no further questions at this time.  Reviewed pt's medication list and both medications have not been refilled by Dr. Mosetta Putt but by pt's PCP.

## 2024-02-04 ENCOUNTER — Ambulatory Visit (HOSPITAL_COMMUNITY)
Admission: RE | Admit: 2024-02-04 | Discharge: 2024-02-04 | Disposition: A | Discharge status: Home / Self Care | Payer: BC Managed Care – PPO | Source: Ambulatory Visit | Attending: Nurse Practitioner | Admitting: Nurse Practitioner

## 2024-02-04 ENCOUNTER — Telehealth: Payer: Self-pay

## 2024-02-04 DIAGNOSIS — C250 Malignant neoplasm of head of pancreas: Secondary | ICD-10-CM | POA: Insufficient documentation

## 2024-02-04 MED ORDER — HEPARIN SOD (PORK) LOCK FLUSH 100 UNIT/ML IV SOLN
500.0000 [IU] | Freq: Once | INTRAVENOUS | Status: AC
  Administered 2024-02-04: 500 [IU] via INTRAVENOUS

## 2024-02-04 MED ORDER — IOHEXOL 300 MG/ML  SOLN
80.0000 mL | Freq: Once | INTRAMUSCULAR | Status: AC | PRN
  Administered 2024-02-04: 80 mL via INTRAVENOUS

## 2024-02-04 MED ORDER — IOHEXOL 300 MG/ML  SOLN: 100.0000 mL | Freq: Once | INTRAMUSCULAR | Status: DC | PRN

## 2024-02-04 MED ORDER — HEPARIN SOD (PORK) LOCK FLUSH 100 UNIT/ML IV SOLN
INTRAVENOUS | Status: AC
  Filled 2024-02-04: qty 5

## 2024-02-04 NOTE — Telephone Encounter (Signed)
 Left VM for patient in regards to her disability forms being completed and faxed. Let the pt know if she has any questions to give the office a call.

## 2024-02-05 ENCOUNTER — Telehealth: Payer: Self-pay

## 2024-02-05 ENCOUNTER — Other Ambulatory Visit: Payer: Self-pay

## 2024-02-05 NOTE — Telephone Encounter (Signed)
 Spoke with pt today about her Disability forms. She stated that it was denied due to her ROI, being outdated. I emailed the patient a updated ROI to be signed. I stayed on the phone with her while she received the email to ensure that she has it. Pt will signed and email back to [facility].

## 2024-02-05 NOTE — Assessment & Plan Note (Addendum)
 stage IIB, [name], [name], [name] -Diagnosed 08/24/20 on EUS with Dr Dulce Sellar, which showed mass at head of pancreas, s/p stenting, with SMV and PV invasion, biopsy confirmed adenocarcinoma with few malignant-appearing LNs in peripancreatic and porta hepatis region. Staging was negative for metastatic disease.  -she completed neoadjuvant chemo FOLFIRINOX q2 weeks 09/12/20 - 12/29/20. -s/p whipple surgery by Dr Flonnie Hailstone on 02/08/21, path showed no residual disease.  -due to the high recurrence risk of pancreatic cancer, she is under close surveillance.  -Her PET scan from September 30, 2023 showed hypermetabolic soft tissue mass at the previous Whipple surgical site, and a small mildly hypermetabolic nodule in the left lung, concerning for metastatic recurrence. -lung biopsy 10/28/2023 confirmed adenocarcinoma, IHC consistent with metastatic pancreatic ca.  -she started chemo NALIRIFOX  on 11/14/23, had a significant diarrhea, required dose reduction. -restaging CT 02/03/2024 showed good partial response

## 2024-02-06 ENCOUNTER — Inpatient Hospital Stay (HOSPITAL_BASED_OUTPATIENT_CLINIC_OR_DEPARTMENT_OTHER): Payer: BC Managed Care – PPO | Admitting: Hematology

## 2024-02-06 ENCOUNTER — Inpatient Hospital Stay: Payer: BC Managed Care – PPO

## 2024-02-06 ENCOUNTER — Other Ambulatory Visit: Payer: Self-pay

## 2024-02-06 ENCOUNTER — Telehealth: Payer: Self-pay

## 2024-02-06 ENCOUNTER — Encounter: Payer: Self-pay | Admitting: Hematology

## 2024-02-06 VITALS — BP 110/78 | HR 95 | Temp 97.5°F | Resp 17 | Ht 60.0 in | Wt 109.3 lb

## 2024-02-06 DIAGNOSIS — E876 Hypokalemia: Secondary | ICD-10-CM | POA: Diagnosis not present

## 2024-02-06 DIAGNOSIS — C250 Malignant neoplasm of head of pancreas: Secondary | ICD-10-CM

## 2024-02-06 DIAGNOSIS — Z5111 Encounter for antineoplastic chemotherapy: Secondary | ICD-10-CM | POA: Diagnosis not present

## 2024-02-06 DIAGNOSIS — Z95828 Presence of other vascular implants and grafts: Secondary | ICD-10-CM

## 2024-02-06 LAB — CBC WITH DIFFERENTIAL (CANCER CENTER ONLY)
Abs Immature Granulocytes: 0.3 10*3/uL — ABNORMAL HIGH (ref 0.00–0.07)
Basophils Absolute: 0 10*3/uL (ref 0.0–0.1)
Basophils Relative: 1 %
Eosinophils Absolute: 0.1 10*3/uL (ref 0.0–0.5)
Eosinophils Relative: 1 %
HCT: 26.5 % — ABNORMAL LOW (ref 36.0–46.0)
Hemoglobin: 8.8 g/dL — ABNORMAL LOW (ref 12.0–15.0)
Immature Granulocytes: 5 %
Lymphocytes Relative: 16 %
Lymphs Abs: 0.9 10*3/uL (ref 0.7–4.0)
MCH: 30.4 pg (ref 26.0–34.0)
MCHC: 33.2 g/dL (ref 30.0–36.0)
MCV: 91.7 fL (ref 80.0–100.0)
Monocytes Absolute: 0.7 10*3/uL (ref 0.1–1.0)
Monocytes Relative: 12 %
Neutro Abs: 3.8 10*3/uL (ref 1.7–7.7)
Neutrophils Relative %: 65 %
Platelet Count: 181 10*3/uL (ref 150–400)
RBC: 2.89 MIL/uL — ABNORMAL LOW (ref 3.87–5.11)
RDW: 18.7 % — ABNORMAL HIGH (ref 11.5–15.5)
WBC Count: 5.8 10*3/uL (ref 4.0–10.5)
nRBC: 0 % (ref 0.0–0.2)

## 2024-02-06 LAB — CMP (CANCER CENTER ONLY)
ALT: 30 U/L (ref 0–44)
AST: 37 U/L (ref 15–41)
Albumin: 3 g/dL — ABNORMAL LOW (ref 3.5–5.0)
Alkaline Phosphatase: 115 U/L (ref 38–126)
Anion gap: 8 (ref 5–15)
BUN: 11 mg/dL (ref 8–23)
CO2: 30 mmol/L (ref 22–32)
Calcium: 8 mg/dL — ABNORMAL LOW (ref 8.9–10.3)
Chloride: 99 mmol/L (ref 98–111)
Creatinine: 0.92 mg/dL (ref 0.44–1.00)
GFR, Estimated: >60 mL/min (ref >60)
Glucose, Bld: 105 mg/dL — ABNORMAL HIGH (ref 70–99)
Potassium: 2.7 mmol/L — CL (ref 3.5–5.1)
Sodium: 137 mmol/L (ref 135–145)
Total Bilirubin: 0.7 mg/dL (ref 0.0–1.2)
Total Protein: 5.5 g/dL — ABNORMAL LOW (ref 6.5–8.1)

## 2024-02-06 MED ORDER — SODIUM CHLORIDE 0.9% FLUSH
10.0000 mL | INTRAVENOUS | Status: DC | PRN
  Administered 2024-02-06: 10 mL

## 2024-02-06 MED ORDER — POTASSIUM CHLORIDE 10 MEQ/100ML IV SOLN
10.0000 meq | INTRAVENOUS | Status: AC
  Administered 2024-02-06 (×3): 10 meq via INTRAVENOUS
  Filled 2024-02-06: qty 100

## 2024-02-06 MED ORDER — SODIUM CHLORIDE 0.9 % IV SOLN: Freq: Once | INTRAVENOUS | Status: AC

## 2024-02-06 NOTE — Progress Notes (Signed)
 DISCONTINUE ON PATHWAY REGIMEN - Pancreatic Adenocarcinoma     A cycle is every 14 days:     Oxaliplatin      Leucovorin      Irinotecan      Fluorouracil   **Always confirm dose/schedule in your pharmacy ordering system**  PRIOR TREATMENT: PANOS94: mFOLFIRINOX q14 Days x 4 Cycles  START ON PATHWAY REGIMEN - Pancreatic Adenocarcinoma     A cycle is every 28 days:     Nab-paclitaxel (protein bound)      Gemcitabine   **Always confirm dose/schedule in your pharmacy ordering system**  Patient Characteristics: Metastatic Disease, Second Line, MSS/pMMR or MSI Unknown, Fluoropyrimidine-Based Therapy First Line Therapeutic Status: Metastatic Disease Line of Therapy: Second Line Microsatellite/Mismatch Repair Status: MSS/pMMR Intent of Therapy: Non-Curative / Palliative Intent, Discussed with Patient

## 2024-02-06 NOTE — Progress Notes (Signed)
 Verbal order w/readback from Dr. Mosetta Putt for IV K+ d/t pt's K+ 2.7 today.  Order placed.

## 2024-02-06 NOTE — Progress Notes (Signed)
 Critical lab value reported K+ 2.7 today.  Dr. Mosetta Putt notified and verbal order given for IV K+ .

## 2024-02-06 NOTE — Progress Notes (Signed)
 [facility]   [phone] [phone]   Clinic Follow up Note   Patient Care Team: Leola Brazil, DO as PCP - General (Internal Medicine) Malachy Mood, MD as Consulting Physician (Oncology) Fritzi Mandes, MD as Consulting Physician (General Surgery)  Date of Service:  02/06/2024  CHIEF COMPLAINT: f/u of pancreatic cancer  CURRENT THERAPY:  NALIRIFOX  Oncology History   Pancreatic cancer [facility]) stage IIB, [name], [name], [name] -Diagnosed 08/24/20 on EUS with Dr Dulce Sellar, which showed mass at head of pancreas, s/p stenting, with SMV and PV invasion, biopsy confirmed adenocarcinoma with few malignant-appearing LNs in peripancreatic and porta hepatis region. Staging was negative for metastatic disease.  -she completed neoadjuvant chemo FOLFIRINOX q2 weeks 09/12/20 - 12/29/20. -s/p whipple surgery by Dr Flonnie Hailstone on 02/08/21, path showed no residual disease.  -due to the high recurrence risk of pancreatic cancer, she is under close surveillance.  -Her PET scan from September 30, 2023 showed hypermetabolic soft tissue mass at the previous Whipple surgical site, and a small mildly hypermetabolic nodule in the left lung, concerning for metastatic recurrence. -lung biopsy 10/28/2023 confirmed adenocarcinoma, IHC consistent with metastatic pancreatic ca.  -she started chemo NALIRIFOX  on 11/14/23, had a significant diarrhea, required dose reduction. -restaging CT 02/03/2024 showed good partial response    Assessment and Plan    Pancreatic cancer She is experiencing significant side effects from the current chemotherapy regimen, including weight loss, diarrhea, and hypokalemia. Despite these side effects, the CT scan shows a good response with smaller lung spots and reduced soft tissue mass. However, the current regimen is too toxic, and her body is not tolerating it well. The decision was made to discontinue the  current chemotherapy and consider a more tolerable regimen with gemcitabine and Abraxane after a recovery period. Radiation therapy may be considered after further chemotherapy if there is a better response, but the chance of recurrence remains high. The goal is to make the treatment more tolerable as she cannot continue with the current level of toxicity. - Cancel current chemotherapy treatment. - Administer IV fluids and potassium today. - Provide a 2-3 week break from chemotherapy to allow recovery. - Consider starting a new chemotherapy regimen with gemcitabine and Abraxane after the break. - Discuss potential radiation therapy with Dr. Mitzi Hansen.  Hypokalemia She has low potassium levels, likely due to diarrhea from chemotherapy. Current oral potassium supplementation is insufficient to maintain normal levels. Potassium levels are critically low at 2.7 mEq/L. - Administer IV potassium today. - Increase oral potassium dosage to two tablets three times a day after meals. - Monitor potassium levels and adjust oral supplementation as needed.  Anxiety She experiences anxiety related to chemotherapy side effects, which may be affecting her food intake. She is currently on Wellbutrin and Celexa, and had previously stopped taking buspirone but has resumed it. Anxiety management is crucial to improve her nutritional intake and overall well-being. - Continue current medications: Wellbutrin, Celexa, and buspirone. - Follow up with family doctor regarding anxiety management.  Plan -Lab and CT scan reviewed, she has had excellent partial response -Due to the significant toxicity, I will stop her current chemo regiment, cancer treatment today, will give IV fluids and IV potassium -Will switch her treatment to gemcitabine and Abraxane every 2 weeks, starting in 2 weeks. -Will review her case in GI tumor board to consider consolidation radiation.     SUMMARY OF ONCOLOGIC HISTORY: Oncology History  Overview Note  Cancer Staging Pancreatic cancer [facility]) Staging form: Exocrine Pancreas, AJCC 8th Edition - Clinical stage from 08/24/2020: Stage IIB (cT2, cN1, cM0) - Signed by Malachy Mood, MD on 08/30/2020 Stage prefix: Initial diagnosis    Pancreatic cancer (HCC)  08/20/2020 Imaging   CT AP 08/20/20  IMPRESSION: 1. 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 Collateral circulation is noted. There is moderate intrahepatic and extrahepatic biliary dilatation which appears to be due to the pancreatic head mass. Pancreatic ductal dilatation is noted as well. 2. Probable 2.7 cm uterine fibroid. 3. Aortic atherosclerosis.   Aortic Atherosclerosis (ICD10-I70.0).   08/20/2020 Tumor Marker   Ca19-9 - 927   08/22/2020 Procedure   ERCP by Dr Ewing Schlein 08/22/20  IMPRESSION - The major papilla appeared normal. - A biliary sphincterotomy was performed. - Cells for cytology obtained in the lower third and middle of the main duct. - One plastic stent was placed into the common bile duct.   FINAL MICROSCOPIC DIAGNOSIS:  - No malignant cells identified  - Benign reactive/reparative changes   08/24/2020 Cancer Staging   Staging form: Exocrine Pancreas, AJCC 8th Edition - Clinical stage from 08/24/2020: Stage IIB (cT2, cN1, cM0) - Signed by Malachy Mood, MD on 08/30/2020   08/24/2020 Procedure   EUS by Dr Dulce Sellar 08/24/20  IMPRESSION - There was no sign of significant pathology in the ampulla. - A few malignant-appearing lymph nodes were visualized in the peripancreatic region and porta hepatis region. - One stent was visualized endosonographically in the common bile duct. - A mass was identified in the pancreatic head. Tissue was obtained from this exam. The preliminary diagnosis is consistent with adenocarcinoma. Invasion  into SMV/PV seen. Lymphadenopathy noted. This was staged T3 N1 Mx by endosonographic criteria. Fine needle aspiration performed.   08/24/2020 Initial Biopsy   FINAL MICROSCOPIC DIAGNOSIS: 08/24/20 - Malignant cells consistent with adenocarcinoma    08/30/2020 Initial Diagnosis   Pancreatic cancer (HCC)   09/05/2020 Imaging   CT Chest  IMPRESSION: 1. Stable 2.7 cm infiltrating pancreatic head mass with borderline enlarged peripancreatic lymph nodes. 2. No findings for pulmonary metastatic disease. 3. Small hiatal hernia. 4. Aortic atherosclerosis.   Aortic Atherosclerosis (ICD10-I70.0).   09/08/2020 Procedure   INSERTION PORT-A-CATH by Dr Freida Busman and Donell Beers    09/12/2020 - 12/29/2020 Chemotherapy   Neoadjuvant FOLFIRINOX q2 weeks starting 09/12/20-12/29/20    12/02/2020 Imaging   CT CAP  IMPRESSION: 1. Previously noted mass of the central pancreatic head is almost entirely resolved, difficult to discretely appreciate on current examination. Findings are consistent with treatment response. There remains obstruction of the pancreatic duct near the head neck junction with mild prominence of the pancreatic duct, measuring up to 5 mm, with atrophy of the distal pancreatic parenchyma. 2. The portal vein, splenic vein, and superior mesenteric vein are now widely patent, previously effaced at the confluence by mass effect. 3. Interval placement of common bile duct stent, tip positioned in the distal duodenum, with relief of previously seen biliary ductal dilatation. 4. 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 Following the bifurcation of the celiac mesenteric trunk, the celiac axis appears to directly traverse the vicinity of the mass, although  there does appear to be a fat plane about the vessel. 5. The distal small  bowel and colon are diffusely somewhat hyperenhancing and inflamed appearing with vascular combing and a tethered appearance of the distal small bowel. This appearance generally suggests inflammatory bowel disease such as Crohn's disease. Correlate with referable clinical history, if present. No evidence of obstruction or other acute complication. 6. Hepatic steatosis. 7. Aortic atherosclerosis.   12/28/2020 Genetic Testing   Negative hereditary cancer genetic testing: no pathogenic variants detected in [employer] Common Hereditary Cancers Panel.  The report date is December 28, 2020.    The Common Hereditary Cancers Panel offered by [employer] includes sequencing and/or deletion duplication testing of the following 47 genes: APC, ATM, AXIN2, BARD1, BMPR1A, BRCA1, BRCA2, BRIP1, CDH1, CDK4, CDKN2A (p14ARF), CDKN2A (p16INK4a), CHEK2, CTNNA1, DICER1, EPCAM (Deletion/duplication testing only), GREM1 (promoter region deletion/duplication testing only), GREM1, HOXB13, KIT, MEN1, MLH1, MSH2, MSH3, MSH6, MUTYH, NBN, NF1, NHTL1, PALB2, PDGFRA, PMS2, POLD1, POLE, PTEN, RAD50, RAD51C, RAD51D, SDHA, SDHB, SDHC, SDHD, SMAD4, SMARCA4. STK11, TP53, TSC1, TSC2, and VHL.  The following genes were evaluated for sequence changes only: SDHA and HOXB13 c.251G>A variant only.   01/17/2021 Imaging   CT CAP from [facility] IMPRESSION Small lesion in the pancreatic head measuring approximately 1.1 x 0.8 cm without evidence of vascular involvement or metastasis consistent with previously described, biopsy-proven pancreatic adenocarcinoma.   02/08/2021 Surgery   Whipple Surgery with Dr Deveron Furlong  Final Pathologic Diagnosis      A.  GALLBLADDER, CHOLECYSTECTOMY: Chronic cholecystitis. No malignancy identified.   B.  BILE DUCT STENT, REMOVAL (GROSS ONLY DIAGNOSIS): Stent, see gross description.   C.  WHIPPLE RESECTION: No residual malignancy identified. Chronic and acute inflammation with granulation tissue reaction, fibrosis and  features of chronic pancreatitis, suggestive of therapy effect. Fourteen lymph nodes, negative for metastasis (0/14). Margins negative for malignancy.        06/07/2021 Imaging   CT AP  IMPRESSION: 1. No current findings of recurrent malignancy. Interval Whipple procedure with expected postoperative findings. 2. A 3.5 cm stent is present in the dorsal pancreatic duct. No duct dilatation. There is an approximately 1.5 mm lucency centrally along this stent shown on image 43 series 7, possibilities include stent fracture, two separate stents, or an intentional radial lucency in this type of stent. 3. Small type 1 hiatal hernia. Mild distal esophageal wall thickening may reflect low-grade esophagitis. 4.  Aortic Atherosclerosis (ICD10-I70.0). 5.  Prominent stool throughout the colon favors constipation. 6. Uterine fibroid. 7. 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   11/14/2023 -  Chemotherapy   Patient is on Treatment Plan : PANCREAS NALIRIFOX D1, 15 Q28D     Port-A-Cath in place     Discussed the use of AI scribe software for clinical note transcription with the patient, who gave verbal consent to proceed.  History of Present Illness   Jacqueline Tyler is a 63 year old female with pancreatic cancer who presents for follow-up.  She has significant weight loss, decreasing from 122 pounds to 109 pounds, due to poor food intake and low energy levels. Her intake is sporadic, sometimes only 50% of normal. She arrived in a wheelchair due to low energy.  She experiences diarrhea nearly every time she eats, with one to two loose stools per day, contributing to weight loss and low potassium levels. She takes oral potassium, two tablets twice a day, but levels remain low.  A recent CT scan shows  a good response to treatment, with smaller lung spots and reduced soft tissue at the surgical site. A couple of lymph nodes are slightly larger, possibly  reactive due to diarrhea. Her hemoglobin is 8.8, lower than before, but no transfusion is needed. Kidney function is normal, but protein levels are low, consistent with previous results.  Her medication regimen includes Wellbutrin and Celexa, taken regularly. She had stopped buspirone for about three months due to forgetfulness but has resumed it. She is in contact with her family doctor regarding her medications.         All other systems were reviewed with the patient and are negative.  MEDICAL HISTORY:  Past Medical History:  Diagnosis Date   Anemia    Anxiety    Cancer (HCC) 07/2020   pancreatic cancer   Depression    Diabetes mellitus without complication (HCC) 07/2020   pancreatic cancer   High cholesterol    History of blood transfusion    History of hiatal hernia    small   Hypertension    Stroke [facility])    age 44, no residual effect    SURGICAL HISTORY: Past Surgical History:  Procedure Laterality Date   BILIARY BRUSHING  08/22/2020   Procedure: BILIARY BRUSHING;  Surgeon: Vida Rigger, MD;  Location: [facility] ENDOSCOPY;  Service: Endoscopy;;   BILIARY STENT PLACEMENT N/A 08/22/2020   Procedure: BILIARY STENT PLACEMENT;  Surgeon: Vida Rigger, MD;  Location: [facility]OS[facility];  Service: Endoscopy;  Laterality: N/A;   BRONCHIAL BIOPSY  10/28/2023   Procedure: BRONCHIAL BIOPSIES;  Surgeon: Leslye Peer, MD;  Location: [facility] ENDOSCOPY;  Service: Pulmonary;;   BRONCHIAL BRUSHINGS  10/28/2023   Procedure: BRONCHIAL BRUSHINGS;  Surgeon: Leslye Peer, MD;  Location: [facility] ENDOSCOPY;  Service: Pulmonary;;   BRONCHIAL NEEDLE ASPIRATION BIOPSY  10/28/2023   Procedure: BRONCHIAL NEEDLE ASPIRATION BIOPSIES;  Surgeon: Leslye Peer, MD;  Location: [facility] ENDOSCOPY;  Service: Pulmonary;;   ENDOSCOPIC RETROGRADE CHOLANGIOPANCREATOGRAPHY (ERCP) WITH PROPOFOL N/A 08/22/2020   Procedure: ENDOSCOPIC RETROGRADE CHOLANGIOPANCREATOGRAPHY (ERCP) WITH PROPOFOL;  Surgeon: Vida Rigger, MD;  Location: [facility]OSCOPY;   Service: Endoscopy;  Laterality: N/A;   ESOPHAGOGASTRODUODENOSCOPY (EGD) WITH PROPOFOL N/A 08/24/2020   Procedure: ESOPHAGOGASTRODUODENOSCOPY (EGD) WITH PROPOFOL;  Surgeon: Willis Modena, MD;  Location: [facility] ENDOSCOPY;  Service: Endoscopy;  Laterality: N/A;   FIDUCIAL MARKER PLACEMENT  10/28/2023   Procedure: FIDUCIAL MARKER PLACEMENT;  Surgeon: Leslye Peer, MD;  Location: [facility] ENDOSCOPY;  Service: Pulmonary;;   FINE NEEDLE ASPIRATION N/A 08/24/2020   Procedure: FINE NEEDLE ASPIRATION (FNA) LINEAR;  Surgeon: Willis Modena, MD;  Location: [facility] ENDOSCOPY;  Service: Endoscopy;  Laterality: N/A;   PORT-A-CATH REMOVAL N/A 12/08/2021   Procedure: REMOVAL PORT-A-CATH;  Surgeon: Fritzi Mandes, MD;  Location: [facility] OR;  Service: General;  Laterality: N/A;   PORTACATH PLACEMENT Right 09/08/2020   Procedure: INSERTION PORT-A-CATH;  Surgeon: Fritzi Mandes, MD;  Location: [facility];  Service: General;  Laterality: Right;   PORTACATH PLACEMENT N/A 11/06/2023   Procedure: INSERTION PORT-A-CATH;  Surgeon: Fritzi Mandes, MD;  Location: [facility] ORS;  Service: General;  Laterality: N/A;  LMA   SPHINCTEROTOMY  08/22/2020   Procedure: Dennison Mascot;  Surgeon: Vida Rigger, MD;  Location: [facility] ENDOSCOPY;  Service: Endoscopy;;   UPPER ESOPHAGEAL ENDOSCOPIC ULTRASOUND (EUS) N/A 08/24/2020   Procedure: UPPER ESOPHAGEAL ENDOSCOPIC ULTRASOUND (EUS);  Surgeon: Willis Modena, MD;  Location: [facility] ENDOSCOPY;  Service: Endoscopy;  Laterality: N/A;    I have reviewed the social history and  family history with the patient and they are unchanged from previous note.  ALLERGIES:  is allergic to irinotecan liposome, tyloxapol, oxycodone-acetaminophen, and poison ivy extract.  MEDICATIONS:  Current Outpatient Medications  Medication Sig Dispense Refill   acetaminophen (TYLENOL) 325 MG tablet Take 650 mg by mouth every 6 (six) hours as needed for moderate pain (pain score 4-6).     busPIRone (BUSPAR) 5 MG tablet Take 5  mg by mouth daily.     citalopram (CELEXA) 40 MG tablet Take 40 mg by mouth every evening.     CREON 36000-114000 units CPEP capsule TAKE 2 CAPSULES BY MOUTH 3 TIMES A DAY WITH A MEAL. MAY ALSO TAKE 1 CAPSULE AS NEEDED WITH SNACKS 240 capsule 2   dexamethasone (DECADRON) 4 MG tablet Take 2 tablets (8 mg total) by mouth daily. Start the day after irinotecan chemotherapy for 2 days. Take with food. 8 tablet 5   hydrochlorothiazide (MICROZIDE) 12.5 MG capsule Take 12.5 mg by mouth in the morning.     lidocaine-prilocaine (EMLA) cream Apply to affected area once 30 g 3   magnesium oxide (MAG-OX) 400 (241.3 Mg) MG tablet Take 1 tablet (400 mg total) by mouth every evening.     mirtazapine (REMERON) 7.5 MG tablet TAKE 1 TABLET BY MOUTH AT BEDTIME. 90 tablet 1   ondansetron (ZOFRAN) 8 MG tablet Take 1 tablet (8 mg total) by mouth every 8 (eight) hours as needed for nausea or vomiting. Start on the third day after irinotecan 30 tablet 1   oxyCODONE-acetaminophen (PERCOCET/ROXICET) 5-325 MG tablet Take 1 tablet by mouth every 6 (six) hours as needed for severe pain (pain score 7-10). 12 tablet 0   potassium chloride (KLOR-CON) 10 MEQ tablet TAKE 1 TABLET BY MOUTH EVERY DAY (Patient taking differently: Take 10 mEq by mouth 2 (two) times daily.) 90 tablet 1   potassium chloride SA (KLOR-CON M) 20 MEQ tablet TAKE 1 TABLET BY MOUTH TWICE A DAY 180 tablet 1   prochlorperazine (COMPAZINE) 10 MG tablet Take 1 tablet (10 mg total) by mouth every 6 (six) hours as needed for nausea or vomiting. 30 tablet 2   simvastatin (ZOCOR) 80 MG tablet Take 80 mg by mouth every evening.     No current facility-administered medications for this visit.    PHYSICAL EXAMINATION: ECOG PERFORMANCE STATUS: 2 - Symptomatic, <50% confined to bed  Vitals:   02/06/24 1023  BP: 110/78  Pulse: 95  Resp: 17  Temp: (!) 97.5 F (36.4 C)  SpO2: 97%   Wt Readings from Last 3 Encounters:  02/06/24 109 lb 4.8 oz (49.6 kg)  01/23/24  117 lb (53.1 kg)  01/09/24 120 lb 8 oz (54.7 kg)     GENERAL:alert, no distress and comfortable SKIN: skin color, texture, turgor are normal, no rashes or significant lesions EYES: normal, Conjunctiva are pink and non-injected, sclera clear NECK: supple, thyroid normal size, non-tender, without nodularity LYMPH:  no palpable lymphadenopathy in the cervical, axillary  LUNGS: clear to auscultation and percussion with normal breathing effort HEART: regular rate & rhythm and no murmurs and no lower extremity edema ABDOMEN:abdomen soft, non-tender and normal bowel sounds Musculoskeletal:no cyanosis of digits and no clubbing  NEURO: alert & oriented x 3 with fluent speech, no focal motor/sensory deficits      LABORATORY DATA:  I have reviewed the data as listed    Latest Ref Rng & Units 02/06/2024    9:32 AM 01/23/2024    9:55 AM 01/09/2024  9:33 AM  CBC  WBC 4.0 - 10.5 K/uL 5.8  6.9  8.5   Hemoglobin 12.0 - 15.0 g/dL 8.8  9.6  16.1   Hematocrit 36.0 - 46.0 % 26.5  29.6  31.0   Platelets 150 - 400 K/uL 181  188  234         Latest Ref Rng & Units 02/06/2024    9:32 AM 01/23/2024    9:55 AM 01/09/2024    9:33 AM  CMP  Glucose 70 - 99 mg/dL 096  045  409   BUN 8 - 23 mg/dL 11  10  11    Creatinine 0.44 - 1.00 mg/dL 8.11  9.14  7.82   Sodium 135 - 145 mmol/L 137  142  141   Potassium 3.5 - 5.1 mmol/L 2.7  3.0  2.7   Chloride 98 - 111 mmol/L 99  107  107   CO2 22 - 32 mmol/L 30  29  27    Calcium 8.9 - 10.3 mg/dL 8.0  8.1  8.1   Total Protein 6.5 - 8.1 g/dL 5.5  5.6  5.2   Total Bilirubin 0.0 - 1.2 mg/dL 0.7  0.5  0.5   Alkaline Phos 38 - 126 U/L 115  128  158   AST 15 - 41 U/L 37  23  24   ALT 0 - 44 U/L 30  18  24        RADIOGRAPHIC STUDIES: I have personally reviewed the radiological images as listed and agreed with the findings in the report. No results found.    No orders of the defined types were placed in this encounter.  All questions were answered. The patient  knows to call the clinic with any problems, questions or concerns. No barriers to learning was detected. The total time spent in the appointment was 50 minutes.     Malachy Mood, MD 02/06/2024

## 2024-02-06 NOTE — Telephone Encounter (Signed)
 I called the pt to let her that I didn't receive her ROI by email. The pt stated that she was here in the building and that she had brought the ROI with her. So I was able to send the updated ROI to HIM so that her disability can be completed. I also was able to give her a copy of her APS.

## 2024-02-08 ENCOUNTER — Inpatient Hospital Stay: Payer: BC Managed Care – PPO

## 2024-02-12 ENCOUNTER — Other Ambulatory Visit: Payer: Self-pay

## 2024-02-13 ENCOUNTER — Telehealth: Payer: Self-pay

## 2024-02-13 ENCOUNTER — Other Ambulatory Visit: Payer: Self-pay

## 2024-02-13 ENCOUNTER — Encounter: Payer: Self-pay | Admitting: Hematology

## 2024-02-13 ENCOUNTER — Inpatient Hospital Stay: Admitting: Licensed Clinical Social Worker

## 2024-02-13 DIAGNOSIS — C250 Malignant neoplasm of head of pancreas: Secondary | ICD-10-CM

## 2024-02-13 NOTE — Telephone Encounter (Signed)
 Spoke with the patient. Instructions reviewed with the patient using teach back method. Questions invited and answered. Confirmed arrival time and that she will have a care partner. No further questions. Understands to call if she thinks of any questions. Thanks me for my call.

## 2024-02-13 NOTE — Progress Notes (Signed)
 The proposed treatment discussed in conference is for discussion purpose only and is not a binding recommendation.  The patients have not been physically examined, or presented with their treatment options.  Therefore, final treatment plans cannot be decided.

## 2024-02-13 NOTE — Progress Notes (Signed)
 [facility] CSW Progress Note  Visual merchandiser  received a request to contact pt regarding anxiety.  Pt reports experiencing increased anxiety recently, but realized she had stopping taking one of her anxiety medications.  Pt contacted her PCP and has resumed her regular medication regiment and reports anxiety is now better controlled.  Pt made aware counseling is available if at any point it made be beneficial.  Pt expressed appreciation for supportive services and will contact CSW if counseling is desired at some point in the future.  Pt also reports a twitching and tightness in her lip which will not stop.  Medical team informed and will follow up w/ pt.  CSW to remain available as appropriate throughout duration of treatment.        Rachel Moulds, LCSW Clinical Social Worker [facility]

## 2024-02-13 NOTE — Telephone Encounter (Signed)
 Patient is returning your call. Patient did request a call back. Please advise.

## 2024-02-13 NOTE — Telephone Encounter (Signed)
-----   Message from [facility] sent at 02/13/2024 10:12 AM EDT ----- Waynetta Sandy, Can you ensure the patient is aware of the date that we have set up for attempt at EUS fiducial placement? Thanks. GM ----- Message ----- From: Malachy Mood, MD Sent: 02/13/2024   9:58 AM EDT To: Dorothy Puffer, MD; Ronny Bacon, PA-C; #  I spoke with pt and she is interested in proceeding with RT.   Dr. Elesa Hacker nurse, please call pt again and let her know the time and location and time about her EUS on 4/10.  Jill Side, please schedule her consultation with you and John.  Mariahm, please cancel all her scheduled appointments, and schedule lab/flush and f/u in 3-4 weeks   I plan to hold chemo from now   Thanks  Terrace Arabia ----- Message ----- From: Lemar Lofty., MD Sent: 02/12/2024   8:12 AM EDT To: Dorothy Puffer, MD; Malachy Mood, MD; #  Beth, While Alexia Freestone is away, can you please hold an Upper EUS slot for this patient on 4/10 or 4/7? We will get an update from Oncology team if the patient agrees then we can work on updating the patient. Thanks. GM ----- Message ----- From: Malachy Mood, MD Sent: 02/12/2024   8:02 AM EDT To: Dorothy Puffer, MD; Lemar Lofty., MD; #  Wilkie Aye,  Please call pt and let her know the tumor board discussion. If she agrees with radiation and fiducial placement, please make the referrals and let us know.  Thanks   Terrace Arabia

## 2024-02-13 NOTE — Telephone Encounter (Signed)
 Called the patient. Voicemail. Left message of my call.

## 2024-02-14 ENCOUNTER — Telehealth: Payer: Self-pay | Admitting: Hematology

## 2024-02-14 NOTE — Telephone Encounter (Signed)
 Scheduled appointments per staff message. Talked with the patient and she is aware.

## 2024-02-19 ENCOUNTER — Ambulatory Visit
Admission: RE | Admit: 2024-02-19 | Discharge: 2024-02-19 | Disposition: A | Discharge status: Home / Self Care | Source: Ambulatory Visit | Attending: Radiation Oncology | Admitting: Radiation Oncology

## 2024-02-19 ENCOUNTER — Encounter: Payer: Self-pay | Admitting: Radiation Oncology

## 2024-02-19 ENCOUNTER — Other Ambulatory Visit: Payer: Self-pay | Admitting: Hematology

## 2024-02-19 ENCOUNTER — Ambulatory Visit

## 2024-02-19 VITALS — Ht 62.0 in | Wt 111.0 lb

## 2024-02-19 DIAGNOSIS — C250 Malignant neoplasm of head of pancreas: Secondary | ICD-10-CM

## 2024-02-19 NOTE — Progress Notes (Signed)
 Radiation Oncology         [phone] ________________________________  Initial Outpatient Consultation - Conducted via telephone at patient request.  I spoke with the patient to conduct this consult visit via telephone. The patient was notified in advance and was offered an in person or telemedicine meeting to allow for face to face communication but instead preferred to proceed with a telephone consult.   Name: Jacqueline Tyler        MRN: [id number]  Date of Service: 02/19/2024 DOB: [birth year]  WU:[id number], Rollene Rotunda, DO  Malachy Mood, MD     REFERRING PHYSICIAN: Malachy Mood, MD   DIAGNOSIS: The encounter diagnosis was Malignant neoplasm of head of pancreas [facility]).   HISTORY OF PRESENT ILLNESS: Jacqueline Tyler is a 63 y.o. female seen at the request of Dr. Mosetta Putt for a diagnosis of recurrent metastatic adenocarcinoma of the pancreas. The patient was diagnosed in October 2021 after undergoing EUS with Dr. Dulce Sellar with adenocarcinoma of the head of pancreas. Her CBD was stented and there was evidence of SMV and Portal Vein invasion as well as regional nodal involvement. She received neoadjuvant chemotherapy between 09/12/20-12/29/20 with evidence of response so she underwent Whipple surgery with Dr. Flonnie Hailstone at [facility] on 02/08/21 which showed no residual disease. She was followed in surveillance and a recent PET on 09/30/23 showed a soft tissue mass measuring 1.9 cm at the previous surgical site with an SUV of 6.1, and a small mildly hypermetabolic nodule in the LUL measuring 1.3 with SUV of 2.9.  No other disease was noted. Bronchoscopy on 10/28/23 of the LUL showed adenocarcinoma consistent with her pancreatic carcinoma in the FNA and biopsy specimens. She began NALIRIFOX chemotherapy on 11/14/23 and had toxicity from diarrhea, weight loss, and electrolyte disturbances. A repeat CT CAP on 02/04/24 showed improvement in the soft tissue changes in the surgical bed of the abdomen measuring 14 mm as opposed to 18-19 mm on  prior PET. The LUL nodule was reduced in size measuring 5 mm as opposed to 13 mm on PET. Her case was discussed in GI oncology conference and since the area in the surgical bed is focal, she is a possible candidate for stereotactic body radiotherapy (SBRT) to the and has plans to undergo EUS next Thursday with Dr. Meridee Score. She is contacted today by phone to discuss this style of therapy. She is planning to begin Gemzar/Abraxane at the conclusion of her radiotherapy.    PREVIOUS RADIATION THERAPY: No   PAST MEDICAL HISTORY:  Past Medical History:  Diagnosis Date   Anemia    Anxiety    Cancer (HCC) 07/2020   pancreatic cancer   Depression    Diabetes mellitus without complication (HCC) 07/2020   pancreatic cancer   High cholesterol    History of blood transfusion    History of hiatal hernia    small   Hypertension    Stroke [facility])    age 18, no residual effect       PAST SURGICAL HISTORY: Past Surgical History:  Procedure Laterality Date   BILIARY BRUSHING  08/22/2020   Procedure: BILIARY BRUSHING;  Surgeon: Vida Rigger, MD;  Location: [facility] ENDOSCOPY;  Service: Endoscopy;;   BILIARY STENT PLACEMENT N/A 08/22/2020   Procedure: BILIARY STENT PLACEMENT;  Surgeon: Vida Rigger, MD;  Location: [facility] ENDOSCOPY;  Service: Endoscopy;  Laterality: N/A;   BRONCHIAL BIOPSY  10/28/2023   Procedure: BRONCHIAL BIOPSIES;  Surgeon: Leslye Peer, MD;  Location: [facility] ENDOSCOPY;  Service: Pulmonary;;  BRONCHIAL BRUSHINGS  10/28/2023   Procedure: BRONCHIAL BRUSHINGS;  Surgeon: Leslye Peer, MD;  Location: [facility] ENDOSCOPY;  Service: Pulmonary;;   BRONCHIAL NEEDLE ASPIRATION BIOPSY  10/28/2023   Procedure: BRONCHIAL NEEDLE ASPIRATION BIOPSIES;  Surgeon: Leslye Peer, MD;  Location: [facility] ENDOSCOPY;  Service: Pulmonary;;   ENDOSCOPIC RETROGRADE CHOLANGIOPANCREATOGRAPHY (ERCP) WITH PROPOFOL N/A 08/22/2020   Procedure: ENDOSCOPIC RETROGRADE CHOLANGIOPANCREATOGRAPHY (ERCP) WITH PROPOFOL;  Surgeon: Vida Rigger,  MD;  Location: [facility] ENDOSCOPY;  Service: Endoscopy;  Laterality: N/A;   ESOPHAGOGASTRODUODENOSCOPY (EGD) WITH PROPOFOL N/A 08/24/2020   Procedure: ESOPHAGOGASTRODUODENOSCOPY (EGD) WITH PROPOFOL;  Surgeon: Willis Modena, MD;  Location: [facility] ENDOSCOPY;  Service: Endoscopy;  Laterality: N/A;   FIDUCIAL MARKER PLACEMENT  10/28/2023   Procedure: FIDUCIAL MARKER PLACEMENT;  Surgeon: Leslye Peer, MD;  Location: [facility] ENDOSCOPY;  Service: Pulmonary;;   FINE NEEDLE ASPIRATION N/A 08/24/2020   Procedure: FINE NEEDLE ASPIRATION (FNA) LINEAR;  Surgeon: Willis Modena, MD;  Location: [facility] ENDOSCOPY;  Service: Endoscopy;  Laterality: N/A;   PORT-A-CATH REMOVAL N/A 12/08/2021   Procedure: REMOVAL PORT-A-CATH;  Surgeon: Fritzi Mandes, MD;  Location: [facility] OR;  Service: General;  Laterality: N/A;   PORTACATH PLACEMENT Right 09/08/2020   Procedure: INSERTION PORT-A-CATH;  Surgeon: Fritzi Mandes, MD;  Location: [facility];  Service: General;  Laterality: Right;   PORTACATH PLACEMENT N/A 11/06/2023   Procedure: INSERTION PORT-A-CATH;  Surgeon: Fritzi Mandes, MD;  Location: [facility] ORS;  Service: General;  Laterality: N/A;  LMA   SPHINCTEROTOMY  08/22/2020   Procedure: Dennison Mascot;  Surgeon: Vida Rigger, MD;  Location: [facility] ENDOSCOPY;  Service: Endoscopy;;   UPPER ESOPHAGEAL ENDOSCOPIC ULTRASOUND (EUS) N/A 08/24/2020   Procedure: UPPER ESOPHAGEAL ENDOSCOPIC ULTRASOUND (EUS);  Surgeon: Willis Modena, MD;  Location: [facility];  Service: Endoscopy;  Laterality: N/A;     FAMILY HISTORY:  Family History  Problem Relation Age of Onset   CAD Mother    Cancer Cousin        paternal cousin; unknown type; dx >50   Other Cousin        paternal cousin; brain tumor     SOCIAL HISTORY:  reports that she has never smoked. She has never used smokeless tobacco. She reports that she does not drink alcohol and does not use drugs. The patient is married and lives in [place]. She is accompanied by her sister who  is in from out of town. She is on disability from working in [employer] sector.    ALLERGIES: Irinotecan liposome, Tyloxapol, Oxycodone-acetaminophen, and Poison ivy extract   MEDICATIONS:  Current Outpatient Medications  Medication Sig Dispense Refill   acetaminophen (TYLENOL) 325 MG tablet Take 650 mg by mouth every 6 (six) hours as needed for moderate pain (pain score 4-6).     busPIRone (BUSPAR) 5 MG tablet Take 5 mg by mouth daily.     citalopram (CELEXA) 40 MG tablet Take 40 mg by mouth every evening.     CREON 36000-114000 units CPEP capsule TAKE 2 CAPSULES BY MOUTH 3 TIMES A DAY WITH A MEAL. MAY ALSO TAKE 1 CAPSULE AS NEEDED WITH SNACKS 240 capsule 2   hydrochlorothiazide (MICROZIDE) 12.5 MG capsule Take 12.5 mg by mouth in the morning.     magnesium oxide (MAG-OX) 400 (241.3 Mg) MG tablet Take 1 tablet (400 mg total) by mouth every evening.     mirtazapine (REMERON) 7.5 MG tablet TAKE 1 TABLET BY MOUTH AT BEDTIME. 90 tablet 1   oxyCODONE-acetaminophen (PERCOCET/ROXICET) 5-325 MG tablet  Take 1 tablet by mouth every 6 (six) hours as needed for severe pain (pain score 7-10). 12 tablet 0   potassium chloride (KLOR-CON) 10 MEQ tablet TAKE 1 TABLET BY MOUTH EVERY DAY (Patient taking differently: Take 10 mEq by mouth 2 (two) times daily.) 90 tablet 1   potassium chloride SA (KLOR-CON M) 20 MEQ tablet TAKE 1 TABLET BY MOUTH TWICE A DAY 180 tablet 1   simvastatin (ZOCOR) 80 MG tablet Take 80 mg by mouth every evening.     No current facility-administered medications for this encounter.     REVIEW OF SYSTEMS: On review of systems, the patient reports that she is doing okay with some intermittent nausea, but no abdominal pain. She has been having weight loss due to diarrhea which is slowly improving, and trying to work on her appetite and oral intake since her taste has been effected poorly by chemotherapy. No other complaints are noted.      PHYSICAL EXAM:  Unable to assess due to  encounter type    ECOG = 1  0 - Asymptomatic (Fully active, able to carry on all predisease activities without restriction)  1 - Symptomatic but completely ambulatory (Restricted in physically strenuous activity but ambulatory and able to carry out work of a light or sedentary nature. For example, light housework, office work)  2 - Symptomatic, <50% in bed during the day (Ambulatory and capable of all self care but unable to carry out any work activities. Up and about more than 50% of waking hours)  3 - Symptomatic, >50% in bed, but not bedbound (Capable of only limited self-care, confined to bed or chair 50% or more of waking hours)  4 - Bedbound (Completely disabled. Cannot carry on any self-care. Totally confined to bed or chair)  5 - Death   Santiago Glad MM, Creech RH, Tormey DC, et al. [phone]). "Toxicity and response criteria of the [facility]". Am. Evlyn Clines. Oncol. 5 (6): 649-55    LABORATORY DATA:  Lab Results  Component Value Date   WBC 5.8 02/06/2024   HGB 8.8 (L) 02/06/2024   HCT 26.5 (L) 02/06/2024   MCV 91.7 02/06/2024   PLT 181 02/06/2024   Lab Results  Component Value Date   NA 137 02/06/2024   K 2.7 (LL) 02/06/2024   CL 99 02/06/2024   CO2 30 02/06/2024   Lab Results  Component Value Date   ALT 30 02/06/2024   AST 37 02/06/2024   ALKPHOS 115 02/06/2024   BILITOT 0.7 02/06/2024      RADIOGRAPHY: CT CHEST ABDOMEN PELVIS W CONTRAST Result Date: 02/04/2024 CLINICAL DATA:  History of pancreatic cancer, monitor. * Tracking Code: BO * EXAM: CT CHEST, ABDOMEN, AND PELVIS WITH CONTRAST TECHNIQUE: Multidetector CT imaging of the chest, abdomen and pelvis was performed following the standard protocol during bolus administration of intravenous contrast. RADIATION DOSE REDUCTION: This exam was performed according to the departmental dose-optimization program which includes automated exposure control, adjustment of the mA and/or kV according to patient  size and/or use of iterative reconstruction technique. CONTRAST:  80mL OMNIPAQUE IOHEXOL 300 MG/ML  SOLN COMPARISON:  Multiple priors including PET-CT September 30, 2023 and CT September 17, 2023. FINDINGS: CT CHEST FINDINGS Cardiovascular: Normal caliber thoracic aorta. Accessed left chest Port-A-Cath with tip near the superior cavoatrial junction. Normal size heart. Mediastinum/Nodes: No pathologically enlarged mediastinal, hilar or axillary lymph nodes. The esophagus is grossly unremarkable. Lungs/Pleura: Significant interval decrease in size the left upper lobe pulmonary nodule  which was hypermetabolic on prior examination now reflecting some wispy stranding with tiny clustered nodularity measuring up to 5 mm on image 36/10 previously measuring 13 mm. No new suspicious pulmonary nodules or masses. Musculoskeletal: No aggressive lytic or blastic lesion of bone. CT ABDOMEN PELVIS FINDINGS Hepatobiliary: Diffuse hepatic steatosis. Gallbladder is absent with surgical changes of Whipple procedure. No biliary ductal dilation. Pancreas: Surgical changes of prior Whipple procedure. Soft tissue nodularity which was hypermetabolic on prior PET-CT now measures 14 x 13 mm on image 33/2 previously 18 x 13 mm when remeasured for consistency. Stent in the pancreatic duct. Pancreatic atrophy without ductal dilation. Spleen: No splenomegaly. Adrenals/Urinary Tract: Bilateral adrenal glands appear normal. No hydronephrosis. Kidneys demonstrate symmetric enhancement. Stomach/Bowel: Surgical changes of prior Whipple procedure. Duodenal wall thickening with adjacent inflammatory stranding. No evidence of bowel obstruction. Vascular/Lymphatic: Aortic atherosclerosis. The portal, splenic and superior mesenteric veins are patent. Increased size of mesenteric lymph nodes measuring up to 7 mm in short axis on image 115/7 previously 3 mm additional perigastric lymph node measures 6 mm on image 98/7, previously 5 mm. Reproductive: Uterus and  bilateral adnexa are unremarkable. Other: Progressive mesenteric stranding. Musculoskeletal: No aggressive lytic or blastic lesion of bone. New L4 superior endplate compression deformity. Unchanged L1 superior endplate compression deformity. IMPRESSION: 1. Surgical changes of prior Whipple procedure with decreased size of the soft tissue nodularity along the proximal margin of the pancreatic body which was hypermetabolic on prior PET-CT. 2. Significant interval decrease in size of the left upper lobe pulmonary nodule which was hypermetabolic on prior examination now reflecting some wispy stranding with tiny clustered nodularity measuring up to 5 mm previously measuring 13 mm. 3. Increased size of mesenteric lymph nodes measuring up to 7 mm in short axis previously 3 mm additional perigastric lymph node measures 6 mm previously 5 mm. Findings are, suggest attention on follow-up imaging. 4. Duodenal wall thickening with adjacent inflammatory stranding, suggestive of duodenitis. 5. New L4 superior endplate compression deformity. Unchanged L1 superior endplate compression deformity. 6. Diffuse hepatic steatosis. 7. Aortic atherosclerosis. Electronically Signed   By: Maudry Mayhew M.D.   On: 02/04/2024 15:09       IMPRESSION/PLAN: 1. Recurrent metastatic [name] adenocarcinoma of the pancreas with local disease at the surgical bed and LUL metastasis. Dr. Mitzi Hansen discusses the pathology findings and reviews the nature of recurrent disease from pancreatic carcinoma. Dr. Mitzi Hansen reviews her imaging and she does have some mesenteric adenopathy that is indeterminate, either concerning for inflammatory changes versus disease, but at this point not conclusive for carcinoma. These areas will need to be followed. Given this, she appears to be a candidate for locally aggressive radiation with SBRT to the pancreas in 5 fractions and SBRT to the LUL nodule in 3-5 fractions.  We discussed the risks, benefits, short, and long term  effects of radiotherapy, as well as the palliative intent, and the patient is interested in proceeding. Dr. Mitzi Hansen discusses the delivery and logistics of radiotherapy and the need for IV contrast and prior placement of fiducial markers. She will have markers placed with Dr. Meridee Score on 02/27/24, simulation the week of 03/02/24, and we anticipate treatment to begin the week of 03/16/24 to both sites. She desires to delay resuming chemotherapy until after completing radiotherapy.     This encounter was conducted via telephone.  The patient has provided two factor identification and has given verbal consent for this type of encounter and has been advised to only accept a meeting of this  type in a secure network environment. The time spent during this encounter was 60 minutes including preparation, discussion, and coordination of the patient's care. The attendants for this meeting include Dr. Mitzi Hansen, Ronny Bacon  and Vic Ripper and her sister who was not named during the call.  During the encounter,   Dr. Mitzi Hansen, and Ronny Bacon were located at [facility] Radiation Oncology Department.  Jacqueline Tyler was located at home with her sister.    The above documentation reflects my direct findings during this shared patient visit. Please see the separate note by Dr. Mitzi Hansen on this date for the remainder of the patient's plan of care.    Osker Mason, [facility]   **Disclaimer: This note was dictated with voice recognition software. Similar sounding words can inadvertently be transcribed and this note may contain transcription errors which may not have been corrected upon publication of note.**

## 2024-02-19 NOTE — Progress Notes (Signed)
 Nursing interview for a diagnosis of  Malignant neoplasm of head of pancreas (HCC).   Patient identity verified x2.  Patient reports doing well. Patient denies any pancreatic related issues at this time.  Meaningful use complete.  Vitals- limited via phone- Ht 5\' 2"  (1.575 m)   Wt 110 lb (49.9 kg)   BMI 20.12 kg/m   This concludes the interaction.  Ruel Favors, LPN

## 2024-02-20 ENCOUNTER — Encounter (HOSPITAL_COMMUNITY): Payer: Self-pay | Admitting: Gastroenterology

## 2024-02-20 ENCOUNTER — Encounter: Admitting: Nutrition

## 2024-02-20 ENCOUNTER — Telehealth: Payer: Self-pay | Admitting: Gastroenterology

## 2024-02-20 ENCOUNTER — Inpatient Hospital Stay: Payer: BC Managed Care – PPO | Admitting: Nurse Practitioner

## 2024-02-20 ENCOUNTER — Inpatient Hospital Stay: Payer: BC Managed Care – PPO

## 2024-02-20 ENCOUNTER — Inpatient Hospital Stay: Admitting: Nutrition

## 2024-02-20 NOTE — Addendum Note (Signed)
 Encounter addended by: Birdena Crandall, LPN on: 11/24/1094 11:15 AM  Actions taken: Chief Complaint modified, Flowsheet accepted, Clinical Note Signed

## 2024-02-20 NOTE — Addendum Note (Signed)
 Encounter addended by: Birdena Crandall, LPN on: 4/0/9811 11:31 AM  Actions taken: Flowsheet accepted

## 2024-02-20 NOTE — Addendum Note (Signed)
 Encounter addended by: Birdena Crandall, LPN on: 07/20/4781 9:56 AM  Actions taken: Pend clinical note

## 2024-02-20 NOTE — Telephone Encounter (Addendum)
 Procedure:EUS Procedure date: 02/27/24 Procedure location: [facility] Arrival Time: 12:00 pm Spoke with the patient Y/N:   No, I left a detailed message for the patient to return call  02/20/24 @ 10:35 am No, I left a detailed message for the patient to return call 02/21/24 @ 3:35 pm No, I left a detailed message for the patient to return call  02/24/24 @ 11:58 I left a detailed message for the patient to return call on husband cell 02/24/24 @ 12:01     Any prep concerns? ___  Has the patient obtained the prep from the pharmacy ? No prep needed Do you have a care partner and transportation: ___ Any additional concerns? ___

## 2024-02-20 NOTE — Addendum Note (Signed)
 Encounter addended by: Birdena Crandall, LPN on: 07/20/4781 12:01 PM  Actions taken: Clinical Note Signed

## 2024-02-24 NOTE — Telephone Encounter (Signed)
Patient retuning call

## 2024-02-24 NOTE — Telephone Encounter (Signed)
 Returned call and made pt aware that we are calling to confirm upcoming appt for EUS.

## 2024-02-27 ENCOUNTER — Ambulatory Visit (HOSPITAL_COMMUNITY)
Admission: RE | Admit: 2024-02-27 | Discharge: 2024-02-27 | Disposition: A | Discharge status: Home / Self Care | Attending: Gastroenterology | Admitting: Gastroenterology

## 2024-02-27 ENCOUNTER — Ambulatory Visit (HOSPITAL_COMMUNITY)

## 2024-02-27 ENCOUNTER — Encounter (HOSPITAL_COMMUNITY)
Admission: RE | Disposition: A | Discharge status: Home / Self Care | Payer: Self-pay | Source: Home / Self Care | Attending: Gastroenterology

## 2024-02-27 ENCOUNTER — Encounter (HOSPITAL_COMMUNITY): Payer: Self-pay | Admitting: Gastroenterology

## 2024-02-27 ENCOUNTER — Other Ambulatory Visit: Payer: Self-pay

## 2024-02-27 DIAGNOSIS — K289 Gastrojejunal ulcer, unspecified as acute or chronic, without hemorrhage or perforation: Secondary | ICD-10-CM | POA: Diagnosis not present

## 2024-02-27 DIAGNOSIS — D490 Neoplasm of unspecified behavior of digestive system: Secondary | ICD-10-CM | POA: Insufficient documentation

## 2024-02-27 DIAGNOSIS — E119 Type 2 diabetes mellitus without complications: Secondary | ICD-10-CM | POA: Insufficient documentation

## 2024-02-27 DIAGNOSIS — I899 Noninfective disorder of lymphatic vessels and lymph nodes, unspecified: Secondary | ICD-10-CM

## 2024-02-27 DIAGNOSIS — K2289 Other specified disease of esophagus: Secondary | ICD-10-CM | POA: Insufficient documentation

## 2024-02-27 DIAGNOSIS — K449 Diaphragmatic hernia without obstruction or gangrene: Secondary | ICD-10-CM | POA: Insufficient documentation

## 2024-02-27 DIAGNOSIS — F419 Anxiety disorder, unspecified: Secondary | ICD-10-CM | POA: Diagnosis not present

## 2024-02-27 DIAGNOSIS — Z8507 Personal history of malignant neoplasm of pancreas: Secondary | ICD-10-CM | POA: Diagnosis not present

## 2024-02-27 DIAGNOSIS — C250 Malignant neoplasm of head of pancreas: Secondary | ICD-10-CM | POA: Diagnosis not present

## 2024-02-27 DIAGNOSIS — K8689 Other specified diseases of pancreas: Secondary | ICD-10-CM | POA: Diagnosis not present

## 2024-02-27 DIAGNOSIS — F32A Depression, unspecified: Secondary | ICD-10-CM | POA: Diagnosis not present

## 2024-02-27 DIAGNOSIS — I10 Essential (primary) hypertension: Secondary | ICD-10-CM | POA: Diagnosis not present

## 2024-02-27 DIAGNOSIS — Z8673 Personal history of transient ischemic attack (TIA), and cerebral infarction without residual deficits: Secondary | ICD-10-CM | POA: Insufficient documentation

## 2024-02-27 DIAGNOSIS — Z980 Intestinal bypass and anastomosis status: Secondary | ICD-10-CM | POA: Insufficient documentation

## 2024-02-27 DIAGNOSIS — K3189 Other diseases of stomach and duodenum: Secondary | ICD-10-CM | POA: Diagnosis not present

## 2024-02-27 DIAGNOSIS — D649 Anemia, unspecified: Secondary | ICD-10-CM | POA: Diagnosis not present

## 2024-02-27 DIAGNOSIS — K219 Gastro-esophageal reflux disease without esophagitis: Secondary | ICD-10-CM | POA: Insufficient documentation

## 2024-02-27 DIAGNOSIS — R935 Abnormal findings on diagnostic imaging of other abdominal regions, including retroperitoneum: Secondary | ICD-10-CM

## 2024-02-27 DIAGNOSIS — R933 Abnormal findings on diagnostic imaging of other parts of digestive tract: Secondary | ICD-10-CM | POA: Diagnosis not present

## 2024-02-27 HISTORY — PX: FIDUCIAL MARKER PLACEMENT: SHX6858

## 2024-02-27 HISTORY — PX: BIOPSY OF SKIN SUBCUTANEOUS TISSUE AND/OR MUCOUS MEMBRANE: SHX6741

## 2024-02-27 HISTORY — PX: EUS: SHX5427

## 2024-02-27 LAB — POCT I-STAT, CHEM 8
BUN: 3 mg/dL — ABNORMAL LOW (ref 8–23)
Calcium, Ion: 1.12 mmol/L — ABNORMAL LOW (ref 1.15–1.40)
Chloride: 105 mmol/L (ref 98–111)
Creatinine, Ser: 0.9 mg/dL (ref 0.44–1.00)
Glucose, Bld: 87 mg/dL (ref 70–99)
HCT: 29 % — ABNORMAL LOW (ref 36.0–46.0)
Hemoglobin: 9.9 g/dL — ABNORMAL LOW (ref 12.0–15.0)
Potassium: 3.2 mmol/L — ABNORMAL LOW (ref 3.5–5.1)
Sodium: 139 mmol/L (ref 135–145)
TCO2: 25 mmol/L (ref 22–32)

## 2024-02-27 SURGERY — ULTRASOUND, UPPER GI TRACT, ENDOSCOPIC
Anesthesia: Monitor Anesthesia Care

## 2024-02-27 MED ORDER — PROPOFOL 1000 MG/100ML IV EMUL
INTRAVENOUS | Status: AC
  Filled 2024-02-27: qty 100

## 2024-02-27 MED ORDER — PROPOFOL 10 MG/ML IV BOLUS
INTRAVENOUS | Status: DC | PRN
  Administered 2024-02-27: 30 mg via INTRAVENOUS

## 2024-02-27 MED ORDER — LIDOCAINE 2% (20 MG/ML) 5 ML SYRINGE
INTRAMUSCULAR | Status: DC | PRN
  Administered 2024-02-27: 100 mg via INTRAVENOUS

## 2024-02-27 MED ORDER — PROPOFOL 500 MG/50ML IV EMUL
INTRAVENOUS | Status: DC | PRN
  Administered 2024-02-27: 150 ug/kg/min via INTRAVENOUS

## 2024-02-27 MED ORDER — SODIUM CHLORIDE 0.9 % IV SOLN: INTRAVENOUS | Status: DC

## 2024-02-27 MED ORDER — PANTOPRAZOLE SODIUM 40 MG PO TBEC: 40.0000 mg | DELAYED_RELEASE_TABLET | Freq: Two times a day (BID) | ORAL | 11 refills | Status: AC

## 2024-02-27 SURGICAL SUPPLY — 1 items: fiducial IMPLANT

## 2024-02-27 NOTE — Anesthesia Postprocedure Evaluation (Signed)
 Anesthesia Post Note  Patient: Emrie Gayle Knisley  Procedure(s) Performed: ULTRASOUND, UPPER GI TRACT, ENDOSCOPIC BIOPSY, SKIN, SUBCUTANEOUS TISSUE, OR MUCOUS MEMBRANE INSERTION, FIDUCIAL MARKERS     Patient location during evaluation: Endoscopy Anesthesia Type: MAC Level of consciousness: awake and alert Pain management: pain level controlled Vital Signs Assessment: post-procedure vital signs reviewed and stable Respiratory status: spontaneous breathing, nonlabored ventilation and respiratory function stable Cardiovascular status: stable and blood pressure returned to baseline Postop Assessment: no apparent nausea or vomiting Anesthetic complications: no  No notable events documented.  Last Vitals:  Vitals:   02/27/24 1330 02/27/24 1340  BP: (!) 124/59 126/66  Pulse: 62 60  Resp: (!) 26 13  Temp:    SpO2: 99% 99%    Last Pain:  Vitals:   02/27/24 1340  TempSrc:   PainSc: 0-No pain                 Brodee Mauritz,W. EDMOND

## 2024-02-27 NOTE — Op Note (Signed)
 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 Estimated Blood Loss:     Estimated blood loss was minimal. Procedure:                Pre-Anesthesia Assessment:                           - Prior to the procedure, a History and Physical                            was performed, and patient medications and                            allergies were reviewed. The patient's tolerance of                            previous anesthesia was also reviewed. The risks                            and benefits of the procedure and the sedation                            options and risks were discussed with the patient.                            All questions were answered, and informed consent                            was obtained. Prior Anticoagulants: The patient has                            taken no anticoagulant or antiplatelet agents. ASA                            Grade  Assessment: III - A patient with severe                            systemic disease. After reviewing the risks and                            benefits, the patient was deemed in satisfactory                            condition to undergo the procedure.                           After obtaining informed consent, the endoscope was                            passed under direct  vision. Throughout the                            procedure, the patient's blood pressure, pulse, and                            oxygen saturations were monitored continuously. The                            [id number]-H190 ([id number]) Olympus endoscope was introduced                            through the mouth, and advanced to the second part                            of duodenum. The [id number] ([id number]) Olympus linear                            ultrasound scope was introduced through the mouth,                            and advanced to the duodenum for ultrasound                            examination from the stomach and duodenum. The                            upper EUS was accomplished without difficulty. The                            patient tolerated the procedure. Scope In: Scope Out: Findings:      ENDOSCOPIC FINDING: :      No gross lesions were noted in the entire esophagus.      The Z-line was irregular and was found 35 cm from the incisors.      A 4 cm hiatal hernia was present.      Patchy mildly erythematous mucosa without bleeding was found in the       entire examined stomach. Biopsies were taken with a cold forceps for  histology and Helicobacter pylori testing.      The pylorus was normal.      There was evidence of a widely patent duodenoenterostomy in the duodenal       bulb. This was characterized by healthy appearing mucosa.      Multiple non-bleeding linear ulcers with a clean ulcer bases (Forrest       Class III) were found approximately 5 cm distal to the anastomosis, and       what was presumed to be the afferent limb. The largest lesion was 15 mm       in largest dimension. Biopsies were taken with a cold forceps for       histology.      Normal mucosa was found in the rest of the visualized proximal jejunum.      ENDOSONOGRAPHIC FINDING: :      An irregular lesion was identified in the pancreatic body (just proximal       to the pancreatic duct stent). The  lesion was hypoechoic. The mass       measured 12 mm by 10 mm in maximal cross-sectional diameter. The outer       margins were irregular. An intact interface was seen between the mass       and the superior mesenteric artery and celiac trunk suggesting a lack of       invasion. The remainder of the pancreas was examined. The       endosonographic appearance of parenchyma and the upstream pancreatic       duct indicated no duct dilation, a pancreatic stent throughout the duct,       and significant parenchymal atrophy. Decision made to pursue fiducial       marker placement. Once the target lesion In the pancreatic body (just       proximal to the pancreatic stent noted) was identified a preloaded       marker in a 22 gauge needle was then deployed in the lesion. This was       repeated for a total of four markers (however only 3 markers looked to       have deployed appropriately).      No malignant-appearing lymph nodes were visualized in the left gastric       region (level 17), gastrohepatic ligament (level 18) and celiac region       (level 20).      Endosonographic imaging in the visualized portion of the liver showed no       mass.      The celiac region was visualized. Impression:               EGD impression:                           - No gross lesions in the entire esophagus. Z-line                            irregular, 35 cm from the incisors.                           - 4 cm hiatal hernia.                           - Erythematous mucosa in the stomach. Biopsied.                           -  Normal pylorus. Patient had pylorus sparing                            Whipple.                           - Widely patent duodenoenterostomy, characterized                            by healthy appearing mucosa was found.                           - Multiple non-bleeding jejunal ulcers with a clean                            ulcer base (Forrest Class III) found 5 cm distal                             and presumed afferent limb. Biopsied.                           - Normal mucosa was found in the rest of the                            visualized jejunum.                           EUS impression:                           - A lesion was identified in the pancreatic body.                            Tissue has not been obtained. However, the                            endosonographic appearance is consistent with                            adenocarcinoma. Fiducial markers were deployed.                           - Pancreatic atrophy noted throughout. Pancreatic                            stent noted within the duct.                           - No malignant-appearing lymph nodes were                            visualized in the left gastric region (level 17),                            gastrohepatic ligament (level 18) and celiac region                            (  level 20). Moderate Sedation:      Not Applicable - Patient had care per Anesthesia. Recommendation:           - The patient will be observed post-procedure,                            until all discharge criteria are met.                           - Discharge patient to home.                           - Patient has a contact number available for                            emergencies. The signs and symptoms of potential                            delayed complications were discussed with the                            patient. Return to normal activities tomorrow.                            Written discharge instructions were provided to the                            patient.                           - Low fat diet for next few days then if doing okay                            can advance as tolerated.                           - Monitor for signs/symptoms of bleeding,                            perforation, and infection. If issues please call                            our number to get further assistance as  needed.                           - Observe patient's clinical course.                           - Await path results.                           - Initiate Protonix 40 mg twice daily for 2 months                            then decrease to once daily.                           -  Follow-up with oncology and radiation oncology                            for next steps in evaluation and treatment.                           - The findings and recommendations were discussed                            with the patient.                           - The findings and recommendations were discussed                            with the patient's family. Procedure Code(s):        --- Professional ---                           [id number], Esophagogastroduodenoscopy, flexible,                            transoral; with transendoscopic ultrasound-guided                            transmural injection of diagnostic or therapeutic                            substance(s) (eg, anesthetic, neurolytic agent) or                            fiducial marker(s) (includes endoscopic ultrasound                            examination of the esophagus, stomach, and either                            the duodenum or a surgically altered stomach where                            the jejunum is examined distal to the anastomosis)                           43239, 59, Esophagogastroduodenoscopy, flexible,                            transoral; with biopsy, single or multiple Diagnosis Code(s):        --- Professional ---                           K22.89, Other specified disease of esophagus                           K44.9, Diaphragmatic hernia without obstruction or  gangrene                           K31.89, Other diseases of stomach and duodenum                           Z98.0, Intestinal bypass and anastomosis status                           K28.9, Gastrojejunal ulcer, unspecified as acute or                             chronic, without hemorrhage or perforation                           K86.89, Other specified diseases of pancreas                           I89.9, Noninfective disorder of lymphatic vessels                            and lymph nodes, unspecified                           C25.9, Malignant neoplasm of pancreas, unspecified                           R93.3, Abnormal findings on diagnostic imaging of                            other parts of digestive tract                           R93.5, Abnormal findings on diagnostic imaging of                            other abdominal regions, including retroperitoneum CPT copyright 2022 [employer] Association. All rights reserved. The codes documented in this report are preliminary and upon coder review may  be revised to meet current compliance requirements. Corliss Parish, MD 02/27/2024 1:26:46 PM Number of Addenda: 0

## 2024-02-27 NOTE — Discharge Instructions (Signed)

## 2024-02-27 NOTE — H&P (Signed)
 GASTROENTEROLOGY PROCEDURE H&P NOTE   Primary Care Physician: Leola Brazil, DO  HPI: Jacqueline Tyler is a 63 y.o. female who presents for EGD/EUS to evaluate and attempt fiducial placement for recurrent pancreatic malignancy post-Whipple.  Past Medical History:  Diagnosis Date   Anemia    Anxiety    Cancer (HCC) 07/2020   pancreatic cancer   Depression    Diabetes mellitus without complication (HCC) 07/2020   pancreatic cancer   High cholesterol    History of blood transfusion    History of hiatal hernia    small   Hypertension    Stroke [facility])    age 42, no residual effect   Past Surgical History:  Procedure Laterality Date   BILIARY BRUSHING  08/22/2020   Procedure: BILIARY BRUSHING;  Surgeon: Vida Rigger, MD;  Location: [facility] ENDOSCOPY;  Service: Endoscopy;;   BILIARY STENT PLACEMENT N/A 08/22/2020   Procedure: BILIARY STENT PLACEMENT;  Surgeon: Vida Rigger, MD;  Location: [facility] ENDOSCOPY;  Service: Endoscopy;  Laterality: N/A;   BRONCHIAL BIOPSY  10/28/2023   Procedure: BRONCHIAL BIOPSIES;  Surgeon: Leslye Peer, MD;  Location[facility] ENDOSCOPY;  Service: Pulmonary;;   BRONCHIAL BRUSHINGS  10/28/2023   Procedure: BRONCHIAL BRUSHINGS;  Surgeon: Leslye Peer, MD;  Location: [facility] ENDOSCOPY;  Service: Pulmonary;;   BRONCHIAL NEEDLE ASPIRATION BIOPSY  10/28/2023   Procedure: BRONCHIAL NEEDLE ASPIRATION BIOPSIES;  Surgeon: Leslye Peer, MD;  Location: [facility] ENDOSCOPY;  Service: Pulmonary;;   ENDOSCOPIC RETROGRADE CHOLANGIOPANCREATOGRAPHY (ERCP) WITH PROPOFOL N/A 08/22/2020   Procedure: ENDOSCOPIC RETROGRADE CHOLANGIOPANCREATOGRAPHY (ERCP) WITH PROPOFOL;  Surgeon: Vida Rigger, MD;  Location: [facility] ENDOSCOPY;  Service: Endoscopy;  Laterality: N/A;   ESOPHAGOGASTRODUODENOSCOPY (EGD) WITH PROPOFOL N/A 08/24/2020   Procedure: ESOPHAGOGASTRODUODENOSCOPY (EGD) WITH PROPOFOL;  Surgeon: Willis Modena, MD;  Location: [facility] ENDOSCOPY;  Service: Endoscopy;  Laterality: N/A;   FIDUCIAL MARKER PLACEMENT   10/28/2023   Procedure: FIDUCIAL MARKER PLACEMENT;  Surgeon: Leslye Peer, MD;  Location: [facility] ENDOSCOPY;  Service: Pulmonary;;   FINE NEEDLE ASPIRATION N/A 08/24/2020   Procedure: FINE NEEDLE ASPIRATION (FNA) LINEAR;  Surgeon: Willis Modena, MD;  Location: [facility] ENDOSCOPY;  Service: Endoscopy;  Laterality: N/A;   PORT-A-CATH REMOVAL N/A 12/08/2021   Procedure: REMOVAL PORT-A-CATH;  Surgeon: Fritzi Mandes, MD;  Location: [facility] OR;  Service: General;  Laterality: N/A;   PORTACATH PLACEMENT Right 09/08/2020   Procedure: INSERTION PORT-A-CATH;  Surgeon: Fritzi Mandes, MD;  Location: [facility];  Service: General;  Laterality: Right;   PORTACATH PLACEMENT N/A 11/06/2023   Procedure: INSERTION PORT-A-CATH;  Surgeon: Fritzi Mandes, MD;  Location: [facility] ORS;  Service: General;  Laterality: N/A;  LMA   SPHINCTEROTOMY  08/22/2020   Procedure: Dennison Mascot;  Surgeon: Vida Rigger, MD;  Location: [facility] ENDOSCOPY;  Service: Endoscopy;;   UPPER ESOPHAGEAL ENDOSCOPIC ULTRASOUND (EUS) N/A 08/24/2020   Procedure: UPPER ESOPHAGEAL ENDOSCOPIC ULTRASOUND (EUS);  Surgeon: Willis Modena, MD;  Location: [facility] ENDOSCOPY;  Service: Endoscopy;  Laterality: N/A;   No current facility-administered medications for this encounter.   No current facility-administered medications for this encounter. Allergies  Allergen Reactions   Irinotecan Liposome Nausea And Vomiting and Other (See Comments)    Patient c/o burning in throat, followed by N/V.  See Progress Note 01/09/24.   Tyloxapol Other (See Comments)    Nausea  Tylox    Oxycodone-Acetaminophen Nausea Only    Patient was unsure / doesn't recall taking    Poison Ivy Extract Hives    Patient  states HIGHLY allergice   Family History  Problem Relation Age of Onset   CAD Mother    Cancer Cousin        paternal cousin; unknown type; dx >50   Other Cousin        paternal cousin; brain tumor   Social History   Socioeconomic History   Marital status:  Married    Spouse name: Not on file   Number of children: 1   Years of education: Not on file   Highest education level: Not on file  Occupational History   Occupation: Advertising account planner   Tobacco Use   Smoking status: Never   Smokeless tobacco: Never  Vaping Use   Vaping status: Never Used  Substance and Sexual Activity   Alcohol use: Never   Drug use: Never   Sexual activity: Not on file  Other Topics Concern   Not on file  Social History Narrative   Not on file   Social Drivers of Health   Financial Resource Strain: Not on file  Food Insecurity: No Food Insecurity (02/19/2024)   Hunger Vital Sign    Worried About Running Out of Food in the Last Year: Never true    Ran Out of Food in the Last Year: Never true  Transportation Needs: No Transportation Needs (02/19/2024)   PRAPARE - Administrator, Civil Service (Medical): No    Lack of Transportation (Non-Medical): No  Physical Activity: Not on file  Stress: Not on file  Social Connections: Not on file  Intimate Partner Violence: Not At Risk (02/19/2024)   Humiliation, Afraid, Rape, and Kick questionnaire    Fear of Current or Ex-Partner: No    Emotionally Abused: No    Physically Abused: No    Sexually Abused: No    Physical Exam: There were no vitals filed for this visit. There is no height or weight on file to calculate BMI. GEN: NAD EYE: Sclerae anicteric ENT: MMM CV: Non-tachycardic GI: Soft, NT/ND NEURO:  Alert & Oriented x 3  Lab Results: No results for input(s): "WBC", "HGB", "HCT", "PLT" in the last 72 hours. BMET No results for input(s): "NA", "K", "CL", "CO2", "GLUCOSE", "BUN", "CREATININE", "CALCIUM" in the last 72 hours. LFT No results for input(s): "PROT", "ALBUMIN", "AST", "ALT", "ALKPHOS", "BILITOT", "BILIDIR", "IBILI" in the last 72 hours. PT/INR No results for input(s): "LABPROT", "INR" in the last 72 hours.   Impression / Plan: This is a 63 y.o.female who presents for EGD/EUS to  evaluate and attempt fiducial placement for recurrent pancreatic malignancy post-Whipple.  The risks of an EUS including intestinal perforation, bleeding, infection, aspiration, and medication effects were discussed as was the possibility it may not give a definitive diagnosis if a biopsy is performed.  When a biopsy of the pancreas is done as part of the EUS, there is an additional risk of pancreatitis at the rate of about 1-2%.  It was explained that procedure related pancreatitis is typically mild, although it can be severe and even life threatening, which is why we do not perform random pancreatic biopsies and only biopsy a lesion/area we feel is concerning enough to warrant the risk.   The risks and benefits of endoscopic evaluation/treatment were discussed with the patient and/or family; these include but are not limited to the risk of perforation, infection, bleeding, missed lesions, lack of diagnosis, severe illness requiring hospitalization, as well as anesthesia and sedation related illnesses.  The patient's history has been reviewed, patient examined, no  change in status, and deemed stable for procedure.  The patient and/or family is agreeable to proceed.    Corliss Parish, MD [facility] Advanced Endoscopy Office # [phone]

## 2024-02-27 NOTE — Anesthesia Preprocedure Evaluation (Addendum)
 Anesthesia Evaluation  Patient identified by MRN, date of birth, ID band Patient awake    Reviewed: Allergy & Precautions, H&P , NPO status , Patient's Chart, lab work & pertinent test results  Airway Mallampati: III  TM Distance: >3 FB Neck ROM: Full    Dental no notable dental hx. (+) Teeth Intact, Dental Advisory Given   Pulmonary neg pulmonary ROS   Pulmonary exam normal breath sounds clear to auscultation       Cardiovascular hypertension, Pt. on medications  Rhythm:Regular Rate:Normal     Neuro/Psych   Anxiety Depression    CVA    GI/Hepatic Neg liver ROS, hiatal hernia,GERD  ,,  Endo/Other  diabetes    Renal/GU negative Renal ROS  negative genitourinary   Musculoskeletal   Abdominal   Peds  Hematology  (+) Blood dyscrasia, anemia   Anesthesia Other Findings   Reproductive/Obstetrics negative OB ROS                             Anesthesia Physical Anesthesia Plan  ASA: 3  Anesthesia Plan: MAC   Post-op Pain Management: Minimal or no pain anticipated   Induction: Intravenous  PONV Risk Score and Plan: 2 and Propofol infusion  Airway Management Planned: Simple Face Mask and Natural Airway  Additional Equipment:   Intra-op Plan:   Post-operative Plan:   Informed Consent: I have reviewed the patients History and Physical, chart, labs and discussed the procedure including the risks, benefits and alternatives for the proposed anesthesia with the patient or authorized representative who has indicated his/her understanding and acceptance.     Dental advisory given  Plan Discussed with: CRNA  Anesthesia Plan Comments:        Anesthesia Quick Evaluation

## 2024-02-27 NOTE — Transfer of Care (Signed)
 Immediate Anesthesia Transfer of Care Note  Patient: Jacqueline Tyler  Procedure(s) Performed: ULTRASOUND, UPPER GI TRACT, ENDOSCOPIC BIOPSY, SKIN, SUBCUTANEOUS TISSUE, OR MUCOUS MEMBRANE INSERTION, FIDUCIAL MARKERS  Patient Location: PACU and Endoscopy Unit  Anesthesia Type:MAC  Level of Consciousness: drowsy  Airway & Oxygen Therapy: Patient Spontanous Breathing and Patient connected to face mask oxygen  Post-op Assessment: Report given to RN and Post -op Vital signs reviewed and stable  Post vital signs: Reviewed and stable  Last Vitals:  Vitals Value Taken Time  BP 118/60 02/27/24 1314  Temp 97   Pulse 57 02/27/24 1316  Resp 19 02/27/24 1316  SpO2 100 % 02/27/24 1316  Vitals shown include unfiled device data.  Last Pain:  Vitals:   02/27/24 1314  TempSrc:   PainSc: Asleep         Complications: No notable events documented.

## 2024-02-28 LAB — SURGICAL PATHOLOGY

## 2024-03-01 ENCOUNTER — Encounter (HOSPITAL_COMMUNITY): Payer: Self-pay | Admitting: Gastroenterology

## 2024-03-03 ENCOUNTER — Ambulatory Visit
Admission: RE | Admit: 2024-03-03 | Discharge: 2024-03-03 | Disposition: A | Discharge status: Home / Self Care | Source: Ambulatory Visit | Attending: Radiation Oncology | Admitting: Radiation Oncology

## 2024-03-03 ENCOUNTER — Other Ambulatory Visit: Payer: Self-pay | Admitting: Radiation Oncology

## 2024-03-03 VITALS — BP 126/66 | HR 65 | Temp 97.7°F | Resp 18 | Ht 61.0 in | Wt 114.0 lb

## 2024-03-03 DIAGNOSIS — C250 Malignant neoplasm of head of pancreas: Secondary | ICD-10-CM | POA: Insufficient documentation

## 2024-03-03 DIAGNOSIS — Z510 Encounter for antineoplastic radiation therapy: Secondary | ICD-10-CM | POA: Insufficient documentation

## 2024-03-03 MED ORDER — SODIUM CHLORIDE 0.9% FLUSH
10.0000 mL | Freq: Once | INTRAVENOUS | Status: AC
  Administered 2024-03-03: 10 mL via INTRAVENOUS

## 2024-03-03 MED ORDER — HEPARIN SOD (PORK) LOCK FLUSH 100 UNIT/ML IV SOLN
500.0000 [IU] | Freq: Once | INTRAVENOUS | Status: AC
  Administered 2024-03-03: 500 [IU] via INTRAVENOUS

## 2024-03-03 NOTE — Progress Notes (Signed)
 Has armband been applied?  Yes  Does patient have an allergy to IV contrast dye?: No   Has patient ever received premedication for IV contrast dye?: n/a   Does patient take metformin?: No  If patient does take metformin when was the last dose: n/a   Date of lab work: 02/06/2024 BUN: 11 CR: 0.92 eGfr: >60  IV site: Left Chest Port  Has IV site been added to flowsheet?  Yes  BP 126/66 (BP Location: Left Arm, Patient Position: Sitting)   Pulse 65   Temp 97.7 F (36.5 C) (Temporal)   Resp 18   Ht 5\' 1"  (1.549 m)   Wt 114 lb (51.7 kg)   SpO2 100%   BMI 21.54 kg/m

## 2024-03-04 NOTE — Assessment & Plan Note (Deleted)
 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 Staging was negative for metastatic disease.  -she completed neoadjuvant chemo FOLFIRINOX q2 weeks 09/12/20 - 12/29/20. -s/p whipple surgery by Dr Almira Armour on 02/08/21, path showed no residual disease.  -due to the high recurrence risk of pancreatic cancer, she is under close surveillance.  -Her PET scan from September 30, 2023 showed hypermetabolic soft tissue mass at the previous Whipple surgical site, and a small mildly hypermetabolic nodule in the left lung, concerning for metastatic recurrence. -lung biopsy 10/28/2023 confirmed adenocarcinoma, IHC consistent with metastatic pancreatic ca.  -she started chemo NALIRIFOX  on 11/14/23, had a significant diarrhea, required dose reduction. -restaging CT 02/03/2024 showed good partial response  -plan to proceed with consolidation chemo RT with xeloda

## 2024-03-05 ENCOUNTER — Other Ambulatory Visit: Payer: BC Managed Care – PPO

## 2024-03-05 ENCOUNTER — Ambulatory Visit: Payer: BC Managed Care – PPO

## 2024-03-05 ENCOUNTER — Telehealth: Payer: Self-pay | Admitting: Hematology

## 2024-03-05 ENCOUNTER — Encounter: Payer: Self-pay | Admitting: Gastroenterology

## 2024-03-05 ENCOUNTER — Other Ambulatory Visit

## 2024-03-05 ENCOUNTER — Ambulatory Visit: Admitting: Hematology

## 2024-03-05 ENCOUNTER — Ambulatory Visit

## 2024-03-05 ENCOUNTER — Ambulatory Visit: Payer: BC Managed Care – PPO | Admitting: Hematology

## 2024-03-05 DIAGNOSIS — C250 Malignant neoplasm of head of pancreas: Secondary | ICD-10-CM

## 2024-03-08 NOTE — Assessment & Plan Note (Signed)
 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 Staging was negative for metastatic disease.  -she completed neoadjuvant chemo FOLFIRINOX q2 weeks 09/12/20 - 12/29/20. -s/p whipple surgery by Dr Almira Armour on 02/08/21, path showed no residual disease.  -due to the high recurrence risk of pancreatic cancer, she is under close surveillance.  -Her PET scan from September 30, 2023 showed hypermetabolic soft tissue mass at the previous Whipple surgical site, and a small mildly hypermetabolic nodule in the left lung, concerning for metastatic recurrence. -lung biopsy 10/28/2023 confirmed adenocarcinoma, IHC consistent with metastatic pancreatic ca.  -she started chemo NALIRIFOX  on 11/14/23, had a significant diarrhea, required dose reduction. S/p 6 cycles  -restaging CT 02/03/2024 showed good partial response  -plan to proceed with consolidation chemo RT with xeloda on 03/09/2024

## 2024-03-09 ENCOUNTER — Inpatient Hospital Stay: Attending: Hematology

## 2024-03-09 ENCOUNTER — Inpatient Hospital Stay (HOSPITAL_BASED_OUTPATIENT_CLINIC_OR_DEPARTMENT_OTHER): Admitting: Hematology

## 2024-03-09 VITALS — BP 114/78 | HR 63 | Temp 97.6°F | Resp 20 | Ht 61.0 in | Wt 116.5 lb

## 2024-03-09 DIAGNOSIS — C250 Malignant neoplasm of head of pancreas: Secondary | ICD-10-CM

## 2024-03-09 DIAGNOSIS — L299 Pruritus, unspecified: Secondary | ICD-10-CM | POA: Diagnosis not present

## 2024-03-09 DIAGNOSIS — K219 Gastro-esophageal reflux disease without esophagitis: Secondary | ICD-10-CM | POA: Insufficient documentation

## 2024-03-09 DIAGNOSIS — Z9221 Personal history of antineoplastic chemotherapy: Secondary | ICD-10-CM | POA: Diagnosis not present

## 2024-03-09 DIAGNOSIS — C7800 Secondary malignant neoplasm of unspecified lung: Secondary | ICD-10-CM | POA: Diagnosis not present

## 2024-03-09 DIAGNOSIS — Z79899 Other long term (current) drug therapy: Secondary | ICD-10-CM | POA: Insufficient documentation

## 2024-03-09 DIAGNOSIS — Z90411 Acquired partial absence of pancreas: Secondary | ICD-10-CM | POA: Diagnosis not present

## 2024-03-09 LAB — CMP (CANCER CENTER ONLY)
ALT: 36 U/L (ref 0–44)
AST: 39 U/L (ref 15–41)
Albumin: 3.4 g/dL — ABNORMAL LOW (ref 3.5–5.0)
Alkaline Phosphatase: 108 U/L (ref 38–126)
Anion gap: 7 (ref 5–15)
BUN: 13 mg/dL (ref 8–23)
CO2: 25 mmol/L (ref 22–32)
Calcium: 8.5 mg/dL — ABNORMAL LOW (ref 8.9–10.3)
Chloride: 110 mmol/L (ref 98–111)
Creatinine: 0.86 mg/dL (ref 0.44–1.00)
GFR, Estimated: >60 mL/min (ref >60)
Glucose, Bld: 131 mg/dL — ABNORMAL HIGH (ref 70–99)
Potassium: 4 mmol/L (ref 3.5–5.1)
Sodium: 142 mmol/L (ref 135–145)
Total Bilirubin: 0.6 mg/dL (ref 0.0–1.2)
Total Protein: 6 g/dL — ABNORMAL LOW (ref 6.5–8.1)

## 2024-03-09 LAB — CBC WITH DIFFERENTIAL (CANCER CENTER ONLY)
Abs Immature Granulocytes: 0.02 10*3/uL (ref 0.00–0.07)
Basophils Absolute: 0 10*3/uL (ref 0.0–0.1)
Basophils Relative: 1 %
Eosinophils Absolute: 0.5 10*3/uL (ref 0.0–0.5)
Eosinophils Relative: 8 %
HCT: 30.6 % — ABNORMAL LOW (ref 36.0–46.0)
Hemoglobin: 9.8 g/dL — ABNORMAL LOW (ref 12.0–15.0)
Immature Granulocytes: 0 %
Lymphocytes Relative: 23 %
Lymphs Abs: 1.3 10*3/uL (ref 0.7–4.0)
MCH: 31.8 pg (ref 26.0–34.0)
MCHC: 32 g/dL (ref 30.0–36.0)
MCV: 99.4 fL (ref 80.0–100.0)
Monocytes Absolute: 0.6 10*3/uL (ref 0.1–1.0)
Monocytes Relative: 11 %
Neutro Abs: 3.3 10*3/uL (ref 1.7–7.7)
Neutrophils Relative %: 57 %
Platelet Count: 244 10*3/uL (ref 150–400)
RBC: 3.08 MIL/uL — ABNORMAL LOW (ref 3.87–5.11)
RDW: 15.8 % — ABNORMAL HIGH (ref 11.5–15.5)
WBC Count: 5.8 10*3/uL (ref 4.0–10.5)
nRBC: 0 % (ref 0.0–0.2)

## 2024-03-09 NOTE — Progress Notes (Signed)
 [facility]   [phone] [phone]   Clinic Follow up Note   Patient Care Team: Rodman Clam, DO as PCP - General (Internal Medicine) Sonja [place], MD as Consulting Physician (Oncology) Lujean Sake, MD as Consulting Physician (General Surgery)  Date of Service:  03/09/2024  CHIEF COMPLAINT: f/u of pancreas cancer  CURRENT THERAPY:  Pending consolidation radiation  Oncology History   Pancreatic cancer [facility]) stage IIB, [name], [name], [name] -Diagnosed 08/24/20 on EUS with Dr Kimble Pennant, which showed mass at head of pancreas, s/p stenting, with SMV and PV invasion, biopsy confirmed adenocarcinoma with few malignant-appearing LNs in peripancreatic and porta hepatis region. Staging was negative for metastatic disease.  -she completed neoadjuvant chemo FOLFIRINOX q2 weeks 09/12/20 - 12/29/20. -s/p whipple surgery by Dr Almira Armour on 02/08/21, path showed no residual disease.  -due to the high recurrence risk of pancreatic cancer, she is under close surveillance.  -Her PET scan from September 30, 2023 showed hypermetabolic soft tissue mass at the previous Whipple surgical site, and a small mildly hypermetabolic nodule in the left lung, concerning for metastatic recurrence. -lung biopsy 10/28/2023 confirmed adenocarcinoma, IHC consistent with metastatic pancreatic ca.  -she started chemo NALIRIFOX  on 11/14/23, had a significant diarrhea, required dose reduction. S/p 6 cycles  -restaging CT 02/03/2024 showed good partial response  -plan to proceed with consolidation chemo RT with xeloda on 03/09/2024   Assessment & Plan Recurrent pancreatic cancer She is undergoing follow-up for recurrent pancreatic cancer. Previous chemotherapy normalized her CA 19-9 tumor marker levels. She is scheduled to start SBRT on March 16, 2024, targeting both pancreatic and lung lesions. The treatment plan includes five sessions of  high-dose radiation, typically without concurrent oral chemotherapy. The prognosis remains guarded with a high likelihood of tumor recurrence, necessitating regular monitoring. The anticipated outcome of the radiation is not curative, with a high chance of tumor regrowth. If tumor markers rise or scans show growth, chemotherapy may be resumed. Given her previous difficulty with chemotherapy, a break is preferred unless aggressive treatment is necessary. - Proceed with SBRT starting March 16, 2024, targeting both pancreatic and lung lesions. - Monitor CA 19-9 tumor marker every six weeks. - Perform CT scans every three months, with potential for PET scans if insurance approves. - Consider resuming chemotherapy if tumor markers rise or if scans show tumor growth. - Schedule follow-up appointment in six weeks, around the beginning of June 2025.  Pruritus She reports generalized pruritus without rash, possibly due to xerosis or allergies. No rash was observed during the examination. Antihistamines may help alleviate symptoms. - Consider taking loratadine  or cetirizine for potential allergy-related pruritus.  Gastroesophageal reflux disease (GERD) She is taking pantoprazole  twice daily before meals to manage GERD and duodenal inflammation. The plan is to reduce the dosage to once daily after one month. - Continue pantoprazole  twice daily before meals for one month, then reduce to once daily.  Peripheral edema She reports peripheral edema in the legs, feet, and ankles, though it was not significantly observed during the examination.  Follow-up She is considering returning to work after her short-term disability ends on May 13, 2024. 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 The decision to return to work will depend on her tolerance to radiation therapy and overall health status. A letter for work clearance can be provided if needed. - Discuss work clearance at the follow-up appointment, with the  possibility of obtaining a letter  from Dr. Jeryl Moris if needed sooner.  Plan - She is scheduled to start SBRT radiation to both lung and local recurrence at the pancreas on April 28, a total of 5 sessions.  I do not recommend concurrent oral chemo Xeloda - Lab and follow-up in 6 weeks, plan to repeat CT in 3 months     SUMMARY OF ONCOLOGIC HISTORY: Oncology History Overview Note  Cancer Staging Pancreatic cancer [facility]) Staging form: Exocrine Pancreas, AJCC 8th Edition - Clinical stage from 08/24/2020: Stage IIB (cT2, cN1, cM0) - Signed by Sonja Lamar, MD on 08/30/2020 Stage prefix: Initial diagnosis    Pancreatic cancer (HCC)  08/20/2020 Imaging   CT AP 08/20/20  IMPRESSION: 1. 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 Collateral circulation is noted. There is moderate intrahepatic and extrahepatic biliary dilatation which appears to be due to the pancreatic head mass. Pancreatic ductal dilatation is noted as well. 2. Probable 2.7 cm uterine fibroid. 3. Aortic atherosclerosis.   Aortic Atherosclerosis (ICD10-I70.0).   08/20/2020 Tumor Marker   Ca19-9 - 927   08/22/2020 Procedure   ERCP by Dr Lavaughn Portland 08/22/20  IMPRESSION - The major papilla appeared normal. - A biliary sphincterotomy was performed. - Cells for cytology obtained in the lower third and middle of the main duct. - One plastic stent was placed into the common bile duct.   FINAL MICROSCOPIC DIAGNOSIS:  - No malignant cells identified  - Benign reactive/reparative changes   08/24/2020 Cancer Staging   Staging form: Exocrine Pancreas, AJCC 8th Edition - Clinical stage from 08/24/2020: Stage IIB (cT2, cN1, cM0) - Signed by Sonja [place], MD on 08/30/2020   08/24/2020 Procedure   EUS by Dr Kimble Pennant 08/24/20  IMPRESSION - There was no sign of  significant pathology in the ampulla. - A few malignant-appearing lymph nodes were visualized in the peripancreatic region and porta hepatis region. - One stent was visualized endosonographically in the common bile duct. - A mass was identified in the pancreatic head. Tissue was obtained from this exam. The preliminary diagnosis is consistent with adenocarcinoma. Invasion into SMV/PV seen. Lymphadenopathy noted. This was staged T3 N1 Mx by endosonographic criteria. Fine needle aspiration performed.   08/24/2020 Initial Biopsy   FINAL MICROSCOPIC DIAGNOSIS: 08/24/20 - Malignant cells consistent with adenocarcinoma    08/30/2020 Initial Diagnosis   Pancreatic cancer (HCC)   09/05/2020 Imaging   CT Chest  IMPRESSION: 1. Stable 2.7 cm infiltrating pancreatic head mass with borderline enlarged peripancreatic lymph nodes. 2. No findings for pulmonary metastatic disease. 3. Small hiatal hernia. 4. Aortic atherosclerosis.   Aortic Atherosclerosis (ICD10-I70.0).   09/08/2020 Procedure   INSERTION PORT-A-CATH by Dr Leighton Punches and Cherlynn Cornfield    09/12/2020 - 12/29/2020 Chemotherapy   Neoadjuvant FOLFIRINOX q2 weeks starting 09/12/20-12/29/20    12/02/2020 Imaging   CT CAP  IMPRESSION: 1. Previously noted mass of the central pancreatic head is almost entirely resolved, difficult to discretely appreciate on current examination. Findings are consistent with treatment response. There remains obstruction of the pancreatic duct near the head neck junction with mild prominence of the pancreatic duct, measuring up to 5 mm, with atrophy of the distal pancreatic parenchyma. 2. The portal vein, splenic vein, and superior mesenteric vein are now widely patent, previously effaced at the confluence by mass effect. 3. Interval placement of common bile  duct stent, tip positioned in the distal duodenum, with relief of previously seen biliary ductal dilatation. 4. 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 Following the bifurcation of the celiac mesenteric trunk, the celiac axis appears to directly traverse the vicinity of the mass, although there does appear to be a fat plane about the vessel. 5. The distal small bowel and colon are diffusely somewhat hyperenhancing and inflamed appearing with vascular combing and a tethered appearance of the distal small bowel. This appearance generally suggests inflammatory bowel disease such as Crohn's disease. Correlate with referable clinical history, if present. No evidence of obstruction or other acute complication. 6. Hepatic steatosis. 7. Aortic atherosclerosis.   12/28/2020 Genetic Testing   Negative hereditary cancer genetic testing: no pathogenic variants detected in [employer] Common Hereditary Cancers Panel.  The report date is December 28, 2020.    The Common Hereditary Cancers Panel offered by [employer] includes sequencing and/or deletion duplication testing of the following 47 genes: APC, ATM, AXIN2, BARD1, BMPR1A, BRCA1, BRCA2, BRIP1, CDH1, CDK4, CDKN2A (p14ARF), CDKN2A (p16INK4a), CHEK2, CTNNA1, DICER1, EPCAM (Deletion/duplication testing only), GREM1 (promoter region deletion/duplication testing only), GREM1, HOXB13, KIT, MEN1, MLH1, MSH2, MSH3, MSH6, MUTYH, NBN, NF1, NHTL1, PALB2, PDGFRA, PMS2, POLD1, POLE, PTEN, RAD50, RAD51C, RAD51D, SDHA, SDHB, SDHC, SDHD, SMAD4, SMARCA4. STK11, TP53, TSC1, TSC2, and VHL.  The following genes were evaluated for sequence changes only: SDHA and HOXB13 c.251G>A variant only.   01/17/2021 Imaging   CT CAP [facility] IMPRESSION Small lesion in the pancreatic head measuring approximately 1.1 x 0.8 cm without evidence of vascular involvement or metastasis consistent with previously described, biopsy-proven pancreatic adenocarcinoma.   02/08/2021 Surgery    Whipple Surgery with Dr Kirk Peper  Final Pathologic Diagnosis      A.  GALLBLADDER, CHOLECYSTECTOMY: Chronic cholecystitis. No malignancy identified.   B.  BILE DUCT STENT, REMOVAL (GROSS ONLY DIAGNOSIS): Stent, see gross description.   C.  WHIPPLE RESECTION: No residual malignancy identified. Chronic and acute inflammation with granulation tissue reaction, fibrosis and features of chronic pancreatitis, suggestive of therapy effect. Fourteen lymph nodes, negative for metastasis (0/14). Margins negative for malignancy.        06/07/2021 Imaging   CT AP  IMPRESSION: 1. No current findings of recurrent malignancy. Interval Whipple procedure with expected postoperative findings. 2. A 3.5 cm stent is present in the dorsal pancreatic duct. No duct dilatation. There is an approximately 1.5 mm lucency centrally along this stent shown on image 43 series 7, possibilities include stent fracture, two separate stents, or an intentional radial lucency in this type of stent. 3. Small type 1 hiatal hernia. Mild distal esophageal wall thickening may reflect low-grade esophagitis. 4.  Aortic Atherosclerosis (ICD10-I70.0). 5.  Prominent stool throughout the colon favors constipation. 6. Uterine fibroid. 7. 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   11/14/2023 - 01/25/2024 Chemotherapy   Patient is on Treatment Plan : PANCREAS NALIRIFOX D1, 15 Q28D     02/20/2024 - 02/20/2024 Chemotherapy   Patient is on Treatment Plan : PANCREATIC Abraxane D1,8,15 + Gemcitabine D1,8,15 q28d     Port-A-Cath in place     Discussed the use of AI scribe software for clinical note transcription with the patient, who gave verbal consent to proceed.  History of Present Illness A 63 year old patient with a history of recurrent pancreatic cancer  presents for follow-up. She reports an improvement in her general condition, including weight gain, increased appetite, and  partial return of taste. However, she also mentions occasional swelling in her legs and feet, and generalized itching. She denies any rash and attributes the itching to dry skin. She has been taking pantoprazole  twice daily before meals for inflammation and acid reflux, which she believes has been beneficial. The patient is considering returning to work after her short-term disability ends in June.     All other systems were reviewed with the patient and are negative.  MEDICAL HISTORY:  Past Medical History:  Diagnosis Date   Anemia    Anxiety    Cancer (HCC) 07/2020   pancreatic cancer   Depression    Diabetes mellitus without complication (HCC) 07/2020   pancreatic cancer   High cholesterol    History of blood transfusion    History of hiatal hernia    small   Hypertension    Stroke [facility])    age 30, no residual effect    SURGICAL HISTORY: Past Surgical History:  Procedure Laterality Date   BILIARY BRUSHING  08/22/2020   Procedure: BILIARY BRUSHING;  Surgeon: Ozell Blunt, MD;  Location: [facility] ENDOSCOPY;  Service: Endoscopy;;   BILIARY STENT PLACEMENT N/A 08/22/2020   Procedure: BILIARY STENT PLACEMENT;  Surgeon: Ozell Blunt, MD;  Location: [facility] ENDOSCOPY;  Service: Endoscopy;  Laterality: N/A;   BIOPSY OF SKIN SUBCUTANEOUS TISSUE AND/OR MUCOUS MEMBRANE  02/27/2024   Procedure: BIOPSY, SKIN, SUBCUTANEOUS TISSUE, OR MUCOUS MEMBRANE;  Surgeon: Mansouraty, Albino Alu., MD;  Location: [facility] ENDOSCOPY;  Service: Gastroenterology;;   BRONCHIAL BIOPSY  10/28/2023   Procedure: BRONCHIAL BIOPSIES;  Surgeon: Denson Flake, MD;  Location: [facility] ENDOSCOPY;  Service: Pulmonary;;   BRONCHIAL BRUSHINGS  10/28/2023   Procedure: BRONCHIAL BRUSHINGS;  Surgeon: Denson Flake, MD;  Location: [facility] ENDOSCOPY;  Service: Pulmonary;;   BRONCHIAL NEEDLE ASPIRATION BIOPSY  10/28/2023   Procedure: BRONCHIAL NEEDLE ASPIRATION BIOPSIES;  Surgeon: Denson Flake, MD;  Location: [facility] ENDOSCOPY;  Service: Pulmonary;;    ENDOSCOPIC RETROGRADE CHOLANGIOPANCREATOGRAPHY (ERCP) WITH PROPOFOL  N/A 08/22/2020   Procedure: ENDOSCOPIC RETROGRADE CHOLANGIOPANCREATOGRAPHY (ERCP) WITH PROPOFOL ;  Surgeon: Ozell Blunt, MD;  Location: [facility] ENDOSCOPY;  Service: Endoscopy;  Laterality: N/A;   ESOPHAGOGASTRODUODENOSCOPY (EGD) WITH PROPOFOL  N/A 08/24/2020   Procedure: ESOPHAGOGASTRODUODENOSCOPY (EGD) WITH PROPOFOL ;  Surgeon: Evangeline Hilts, MD;  Location: [facility] ENDOSCOPY;  Service: Endoscopy;  Laterality: N/A;   EUS N/A 02/27/2024   Procedure: ULTRASOUND, UPPER GI TRACT, ENDOSCOPIC;  Surgeon: Brice Campi Albino Alu., MD;  Location: [facility] ENDOSCOPY;  Service: Gastroenterology;  Laterality: N/A;   FIDUCIAL MARKER PLACEMENT  10/28/2023   Procedure: FIDUCIAL MARKER PLACEMENT;  Surgeon: Denson Flake, MD;  Location: [facility] ENDOSCOPY;  Service: Pulmonary;;   FIDUCIAL MARKER PLACEMENT  02/27/2024   Procedure: INSERTION, FIDUCIAL MARKERS;  Surgeon: Normie Becton., MD;  Location: [facility];  Service: Gastroenterology;;   FINE NEEDLE ASPIRATION N/A 08/24/2020   Procedure: FINE NEEDLE ASPIRATION (FNA) LINEAR;  Surgeon: Evangeline Hilts, MD;  Location: [facility] ENDOSCOPY;  Service: Endoscopy;  Laterality: N/A;   PORT-A-CATH REMOVAL N/A 12/08/2021   Procedure: REMOVAL PORT-A-CATH;  Surgeon: Lujean Sake, MD;  Location: [facility] OR;  Service: General;  Laterality: N/A;   PORTACATH PLACEMENT Right 09/08/2020   Procedure: INSERTION PORT-A-CATH;  Surgeon: Lujean Sake, MD;  Location: [facility];  Service: General;  Laterality: Right;   PORTACATH PLACEMENT N/A 11/06/2023   Procedure: INSERTION PORT-A-CATH;  Surgeon: Lujean Sake, MD;  Location: [facility] ORS;  Service: General;  Laterality: N/A;  LMA   SPHINCTEROTOMY  08/22/2020   Procedure: SPHINCTEROTOMY;  Surgeon: Ozell Blunt, MD;  Location: [facility] ENDOSCOPY;  Service: Endoscopy;;   UPPER ESOPHAGEAL ENDOSCOPIC ULTRASOUND (EUS) N/A 08/24/2020   Procedure: UPPER ESOPHAGEAL ENDOSCOPIC ULTRASOUND (EUS);   Surgeon: Evangeline Hilts, MD;  Location: [facility];  Service: Endoscopy;  Laterality: N/A;    I have reviewed the social history and family history with the patient and they are unchanged from previous note.  ALLERGIES:  is allergic to irinotecan  liposome, tyloxapol, oxycodone -acetaminophen , and poison ivy extract.  MEDICATIONS:  Current Outpatient Medications  Medication Sig Dispense Refill   acetaminophen  (TYLENOL ) 325 MG tablet Take 650 mg by mouth every 6 (six) hours as needed for moderate pain (pain score 4-6).     busPIRone (BUSPAR) 5 MG tablet Take 5 mg by mouth daily.     citalopram  (CELEXA ) 40 MG tablet Take 40 mg by mouth every evening.     CREON  36000-114000 units CPEP capsule TAKE 2 CAPSULES BY MOUTH 3 TIMES A DAY WITH A MEAL. MAY ALSO TAKE 1 CAPSULE AS NEEDED WITH SNACKS 240 capsule 2   hydrochlorothiazide (MICROZIDE) 12.5 MG capsule Take 12.5 mg by mouth in the morning.     magnesium  oxide (MAG-OX) 400 (241.3 Mg) MG tablet Take 1 tablet (400 mg total) by mouth every evening.     oxyCODONE -acetaminophen  (PERCOCET/ROXICET) 5-325 MG tablet Take 1 tablet by mouth every 6 (six) hours as needed for severe pain (pain score 7-10). 12 tablet 0   pantoprazole  (PROTONIX ) 40 MG tablet Take 1 tablet (40 mg total) by mouth 2 (two) times daily before a meal. Twice daily for 2 months then once daily thereafter. 60 tablet 11   potassium chloride  (KLOR-CON ) 10 MEQ tablet TAKE 1 TABLET BY MOUTH EVERY DAY (Patient taking differently: Take 10 mEq by mouth 2 (two) times daily.) 90 tablet 1   potassium chloride  SA (KLOR-CON  M) 20 MEQ tablet TAKE 1 TABLET BY MOUTH TWICE A DAY 180 tablet 1   simvastatin (ZOCOR) 80 MG tablet Take 80 mg by mouth every evening.     No current facility-administered medications for this visit.    PHYSICAL EXAMINATION: ECOG PERFORMANCE STATUS: 1 - Symptomatic but completely ambulatory  Vitals:   03/09/24 0934  BP: 114/78  Pulse: 63  Resp: 20  Temp: 97.6 F (36.4  C)  SpO2: 99%   Wt Readings from Last 3 Encounters:  03/09/24 116 lb 8 oz (52.8 kg)  03/03/24 114 lb (51.7 kg)  02/27/24 115 lb (52.2 kg)     GENERAL:alert, no distress and comfortable SKIN: skin color, texture, turgor are normal, no rashes or significant lesions EYES: normal, Conjunctiva are pink and non-injected, sclera clear NECK: supple, thyroid normal size, non-tender, without nodularity LYMPH:  no palpable lymphadenopathy in the cervical, axillary  LUNGS: clear to auscultation and percussion with normal breathing effort HEART: regular rate & rhythm and no murmurs and no lower extremity edema ABDOMEN:abdomen soft, non-tender and normal bowel sounds Musculoskeletal:no cyanosis of digits and no clubbing  NEURO: alert & oriented x 3 with fluent speech, no focal motor/sensory deficits  Physical Exam   LABORATORY DATA:  I have reviewed the data as listed    Latest Ref Rng & Units 03/09/2024    9:14 AM 02/27/2024   11:47 AM 02/06/2024    9:32 AM  CBC  WBC 4.0 - 10.5 K/uL 5.8   5.8   Hemoglobin 12.0 - 15.0  g/dL 9.8  9.9  8.8   Hematocrit 36.0 - 46.0 % 30.6  29.0  26.5   Platelets 150 - 400 K/uL 244   181         Latest Ref Rng & Units 03/09/2024    9:14 AM 02/27/2024   11:47 AM 02/06/2024    9:32 AM  CMP  Glucose 70 - 99 mg/dL 811  87  914   BUN 8 - 23 mg/dL 13  3  11    Creatinine 0.44 - 1.00 mg/dL 7.82  9.56  2.13   Sodium 135 - 145 mmol/L 142  139  137   Potassium 3.5 - 5.1 mmol/L 4.0  3.2  2.7   Chloride 98 - 111 mmol/L 110  105  99   CO2 22 - 32 mmol/L 25   30   Calcium  8.9 - 10.3 mg/dL 8.5   8.0   Total Protein 6.5 - 8.1 g/dL 6.0   5.5   Total Bilirubin 0.0 - 1.2 mg/dL 0.6   0.7   Alkaline Phos 38 - 126 U/L 108   115   AST 15 - 41 U/L 39   37   ALT 0 - 44 U/L 36   30       RADIOGRAPHIC STUDIES: I have personally reviewed the radiological images as listed and agreed with the findings in the report. No results found.    No orders of the defined types were  placed in this encounter.  All questions were answered. The patient knows to call the clinic with any problems, questions or concerns. No barriers to learning was detected. The total time spent in the appointment was 25 minutes.     Sonja [place], MD 03/09/2024

## 2024-03-10 LAB — CANCER ANTIGEN 19-9: CA 19-9: 13 U/mL (ref 0–35)

## 2024-03-13 DIAGNOSIS — Z510 Encounter for antineoplastic radiation therapy: Secondary | ICD-10-CM | POA: Diagnosis not present

## 2024-03-16 ENCOUNTER — Ambulatory Visit
Admission: RE | Admit: 2024-03-16 | Discharge: 2024-03-16 | Disposition: A | Discharge status: Home / Self Care | Source: Ambulatory Visit | Attending: Radiation Oncology | Admitting: Radiation Oncology

## 2024-03-16 ENCOUNTER — Other Ambulatory Visit: Payer: Self-pay

## 2024-03-16 DIAGNOSIS — Z510 Encounter for antineoplastic radiation therapy: Secondary | ICD-10-CM | POA: Diagnosis not present

## 2024-03-16 LAB — RAD ONC ARIA SESSION SUMMARY
Course Elapsed Days: 0
Plan Fractions Treated to Date: 1
Plan Fractions Treated to Date: 1
Plan Prescribed Dose Per Fraction: 10 Gy
Plan Prescribed Dose Per Fraction: 6.6 Gy
Plan Total Fractions Prescribed: 5
Plan Total Fractions Prescribed: 5
Plan Total Prescribed Dose: 33 Gy
Plan Total Prescribed Dose: 50 Gy
Reference Point Dosage Given to Date: 10 Gy
Reference Point Dosage Given to Date: 6.6 Gy
Reference Point Session Dosage Given: 10 Gy
Reference Point Session Dosage Given: 6.6 Gy
Session Number: 1

## 2024-03-17 ENCOUNTER — Ambulatory Visit: Admitting: Radiation Oncology

## 2024-03-18 ENCOUNTER — Ambulatory Visit
Admission: RE | Admit: 2024-03-18 | Discharge: 2024-03-18 | Disposition: A | Discharge status: Home / Self Care | Source: Ambulatory Visit | Attending: Radiation Oncology | Admitting: Radiation Oncology

## 2024-03-18 ENCOUNTER — Other Ambulatory Visit: Payer: Self-pay

## 2024-03-18 DIAGNOSIS — Z510 Encounter for antineoplastic radiation therapy: Secondary | ICD-10-CM | POA: Diagnosis not present

## 2024-03-18 LAB — RAD ONC ARIA SESSION SUMMARY
Course Elapsed Days: 2
Plan Fractions Treated to Date: 2
Plan Fractions Treated to Date: 2
Plan Prescribed Dose Per Fraction: 10 Gy
Plan Prescribed Dose Per Fraction: 6.6 Gy
Plan Total Fractions Prescribed: 5
Plan Total Fractions Prescribed: 5
Plan Total Prescribed Dose: 33 Gy
Plan Total Prescribed Dose: 50 Gy
Reference Point Dosage Given to Date: 13.2 Gy
Reference Point Dosage Given to Date: 20 Gy
Reference Point Session Dosage Given: 10 Gy
Reference Point Session Dosage Given: 6.6 Gy
Session Number: 2

## 2024-03-19 ENCOUNTER — Other Ambulatory Visit: Payer: BC Managed Care – PPO

## 2024-03-19 ENCOUNTER — Ambulatory Visit: Admitting: Radiation Oncology

## 2024-03-19 ENCOUNTER — Ambulatory Visit: Payer: BC Managed Care – PPO | Admitting: Nurse Practitioner

## 2024-03-19 ENCOUNTER — Ambulatory Visit: Payer: BC Managed Care – PPO

## 2024-03-19 ENCOUNTER — Ambulatory Visit

## 2024-03-20 ENCOUNTER — Ambulatory Visit
Admission: RE | Admit: 2024-03-20 | Discharge: 2024-03-20 | Disposition: A | Discharge status: Home / Self Care | Source: Ambulatory Visit | Attending: Radiation Oncology

## 2024-03-20 ENCOUNTER — Other Ambulatory Visit: Payer: Self-pay

## 2024-03-20 ENCOUNTER — Ambulatory Visit
Admission: RE | Admit: 2024-03-20 | Discharge: 2024-03-20 | Disposition: A | Discharge status: Home / Self Care | Source: Ambulatory Visit | Attending: Radiation Oncology | Admitting: Radiation Oncology

## 2024-03-20 DIAGNOSIS — C250 Malignant neoplasm of head of pancreas: Secondary | ICD-10-CM | POA: Diagnosis present

## 2024-03-20 DIAGNOSIS — Z510 Encounter for antineoplastic radiation therapy: Secondary | ICD-10-CM | POA: Diagnosis present

## 2024-03-20 LAB — RAD ONC ARIA SESSION SUMMARY
Course Elapsed Days: 4
Plan Fractions Treated to Date: 3
Plan Fractions Treated to Date: 3
Plan Prescribed Dose Per Fraction: 10 Gy
Plan Prescribed Dose Per Fraction: 6.6 Gy
Plan Total Fractions Prescribed: 5
Plan Total Fractions Prescribed: 5
Plan Total Prescribed Dose: 33 Gy
Plan Total Prescribed Dose: 50 Gy
Reference Point Dosage Given to Date: 19.8 Gy
Reference Point Dosage Given to Date: 30 Gy
Reference Point Session Dosage Given: 10 Gy
Reference Point Session Dosage Given: 6.6 Gy
Session Number: 3

## 2024-03-24 ENCOUNTER — Other Ambulatory Visit: Payer: Self-pay

## 2024-03-24 ENCOUNTER — Other Ambulatory Visit: Payer: Self-pay | Admitting: Nurse Practitioner

## 2024-03-24 ENCOUNTER — Ambulatory Visit
Admission: RE | Admit: 2024-03-24 | Discharge: 2024-03-24 | Disposition: A | Discharge status: Home / Self Care | Source: Ambulatory Visit | Attending: Radiation Oncology | Admitting: Radiation Oncology

## 2024-03-24 DIAGNOSIS — Z510 Encounter for antineoplastic radiation therapy: Secondary | ICD-10-CM | POA: Diagnosis not present

## 2024-03-24 LAB — RAD ONC ARIA SESSION SUMMARY
Course Elapsed Days: 8
Plan Fractions Treated to Date: 4
Plan Fractions Treated to Date: 4
Plan Prescribed Dose Per Fraction: 10 Gy
Plan Prescribed Dose Per Fraction: 6.6 Gy
Plan Total Fractions Prescribed: 5
Plan Total Fractions Prescribed: 5
Plan Total Prescribed Dose: 33 Gy
Plan Total Prescribed Dose: 50 Gy
Reference Point Dosage Given to Date: 26.4 Gy
Reference Point Dosage Given to Date: 40 Gy
Reference Point Session Dosage Given: 10 Gy
Reference Point Session Dosage Given: 6.6 Gy
Session Number: 4

## 2024-03-24 NOTE — Progress Notes (Signed)
  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 The Pancreatic bed will be treated to 33 Gy after Thursday's treatment, and she has to date received 26.4 Gy. The LUL nodule will be treated to 50 Gy after Thursday's treatment and she has to date received 40 Gy.   The patient has tolerated radiation. She has not had fatigue or nausea but some loose stool during therapy but feels like this is managable. She will finish treatment on Thursday.   The patient will receive a call in about one month from the radiation oncology department. She will continue follow up with Dr. Maryalice Smaller as well.      Shelvia Dick, PAC

## 2024-03-26 ENCOUNTER — Other Ambulatory Visit: Payer: Self-pay

## 2024-03-26 ENCOUNTER — Ambulatory Visit
Admission: RE | Admit: 2024-03-26 | Discharge: 2024-03-26 | Disposition: A | Discharge status: Home / Self Care | Source: Ambulatory Visit | Attending: Radiation Oncology | Admitting: Radiation Oncology

## 2024-03-26 DIAGNOSIS — Z510 Encounter for antineoplastic radiation therapy: Secondary | ICD-10-CM | POA: Diagnosis not present

## 2024-03-26 LAB — RAD ONC ARIA SESSION SUMMARY
Course Elapsed Days: 10
Plan Fractions Treated to Date: 5
Plan Fractions Treated to Date: 5
Plan Prescribed Dose Per Fraction: 10 Gy
Plan Prescribed Dose Per Fraction: 6.6 Gy
Plan Total Fractions Prescribed: 5
Plan Total Fractions Prescribed: 5
Plan Total Prescribed Dose: 33 Gy
Plan Total Prescribed Dose: 50 Gy
Reference Point Dosage Given to Date: 33 Gy
Reference Point Dosage Given to Date: 50 Gy
Reference Point Session Dosage Given: 10 Gy
Reference Point Session Dosage Given: 6.6 Gy
Session Number: 5

## 2024-03-27 NOTE — Radiation Completion Notes (Signed)
  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 with local disease at the surgical bed and LUL metastasis   Intent: Curative     ==========DELIVERED PLANS==========  First Treatment Date: 2024-03-16 Last Treatment Date: 2024-03-26   Plan Name: Pancreas_SBRT Site: Pancreas Technique: SBRT/SRT-IMRT Mode: Photon Dose Per Fraction: 6.6 Gy Prescribed Dose (Delivered / Prescribed): 33 Gy / 33 Gy Prescribed Fxs (Delivered / Prescribed): 5 / 5   Plan Name: Lung_L_SBRT Site: LUL Technique: SBRT/SRT-IMRT Mode: Photon Dose Per Fraction: 10 Gy Prescribed Dose (Delivered / Prescribed): 50 Gy / 50 Gy Prescribed Fxs (Delivered / Prescribed): 5 / 5     ==========ON TREATMENT VISIT DATES========== 2024-03-16, 2024-03-16, 2024-03-18, 2024-03-18, 2024-03-20, 2024-03-20, 2024-03-24, 2024-03-24, 2024-03-26, 2024-03-26    See weekly On Treatment Notes in Epic for details in the Media tab (listed as Progress notes on the On Treatment Visit Dates listed above). The patient tolerated radiation. She developed loose stool during treatment but no complaints of fatigue or nausea were noted.  The patient will receive a call in about one month from the radiation oncology department. She will continue follow up with Dr. Maryalice Smaller as well.      Shelvia Dick, PAC

## 2024-04-02 ENCOUNTER — Ambulatory Visit: Admitting: Hematology

## 2024-04-02 ENCOUNTER — Ambulatory Visit

## 2024-04-02 ENCOUNTER — Other Ambulatory Visit

## 2024-04-04 ENCOUNTER — Encounter

## 2024-04-20 NOTE — Assessment & Plan Note (Addendum)
 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 Staging was negative for metastatic disease.  -she completed neoadjuvant chemo FOLFIRINOX q2 weeks 09/12/20 - 12/29/20. -s/p whipple surgery by Dr Almira Armour on 02/08/21, path showed no residual disease.  -due to the high recurrence risk of pancreatic cancer, she is under close surveillance.  -Her PET scan from September 30, 2023 showed hypermetabolic soft tissue mass at the previous Whipple surgical site, and a small mildly hypermetabolic nodule in the left lung, concerning for metastatic recurrence. -lung biopsy 10/28/2023 confirmed adenocarcinoma, IHC consistent with metastatic pancreatic ca.  -she started chemo NALIRIFOX  on 11/14/23, had a significant diarrhea, required dose reduction. S/p 6 cycles  -restaging CT 02/03/2024 showed good partial response  -she started consolidation SBRT to lung and pancrease on 03/16/2024 and completed on 03/26/2024

## 2024-04-21 ENCOUNTER — Encounter: Payer: Self-pay | Admitting: Hematology

## 2024-04-21 ENCOUNTER — Inpatient Hospital Stay: Attending: Hematology

## 2024-04-21 ENCOUNTER — Inpatient Hospital Stay (HOSPITAL_BASED_OUTPATIENT_CLINIC_OR_DEPARTMENT_OTHER): Admitting: Hematology

## 2024-04-21 VITALS — BP 106/64 | HR 86 | Temp 98.2°F | Resp 15 | Ht 61.0 in | Wt 119.3 lb

## 2024-04-21 DIAGNOSIS — C250 Malignant neoplasm of head of pancreas: Secondary | ICD-10-CM | POA: Insufficient documentation

## 2024-04-21 DIAGNOSIS — Z923 Personal history of irradiation: Secondary | ICD-10-CM | POA: Diagnosis not present

## 2024-04-21 DIAGNOSIS — Z9221 Personal history of antineoplastic chemotherapy: Secondary | ICD-10-CM | POA: Insufficient documentation

## 2024-04-21 DIAGNOSIS — C7800 Secondary malignant neoplasm of unspecified lung: Secondary | ICD-10-CM | POA: Diagnosis not present

## 2024-04-21 DIAGNOSIS — Z95828 Presence of other vascular implants and grafts: Secondary | ICD-10-CM

## 2024-04-21 LAB — CBC WITH DIFFERENTIAL (CANCER CENTER ONLY)
Abs Immature Granulocytes: 0.01 10*3/uL (ref 0.00–0.07)
Basophils Absolute: 0 10*3/uL (ref 0.0–0.1)
Basophils Relative: 1 %
Eosinophils Absolute: 0.2 10*3/uL (ref 0.0–0.5)
Eosinophils Relative: 3 %
HCT: 32.2 % — ABNORMAL LOW (ref 36.0–46.0)
Hemoglobin: 10.7 g/dL — ABNORMAL LOW (ref 12.0–15.0)
Immature Granulocytes: 0 %
Lymphocytes Relative: 21 %
Lymphs Abs: 1 10*3/uL (ref 0.7–4.0)
MCH: 29.6 pg (ref 26.0–34.0)
MCHC: 33.2 g/dL (ref 30.0–36.0)
MCV: 89.2 fL (ref 80.0–100.0)
Monocytes Absolute: 0.5 10*3/uL (ref 0.1–1.0)
Monocytes Relative: 11 %
Neutro Abs: 3 10*3/uL (ref 1.7–7.7)
Neutrophils Relative %: 64 %
Platelet Count: 244 10*3/uL (ref 150–400)
RBC: 3.61 MIL/uL — ABNORMAL LOW (ref 3.87–5.11)
RDW: 13.2 % (ref 11.5–15.5)
WBC Count: 4.6 10*3/uL (ref 4.0–10.5)
nRBC: 0 % (ref 0.0–0.2)

## 2024-04-21 LAB — CMP (CANCER CENTER ONLY)
ALT: 12 U/L (ref 0–44)
AST: 17 U/L (ref 15–41)
Albumin: 4 g/dL (ref 3.5–5.0)
Alkaline Phosphatase: 76 U/L (ref 38–126)
Anion gap: 7 (ref 5–15)
BUN: 15 mg/dL (ref 8–23)
CO2: 25 mmol/L (ref 22–32)
Calcium: 9 mg/dL (ref 8.9–10.3)
Chloride: 109 mmol/L (ref 98–111)
Creatinine: 0.89 mg/dL (ref 0.44–1.00)
GFR, Estimated: >60 mL/min (ref >60)
Glucose, Bld: 144 mg/dL — ABNORMAL HIGH (ref 70–99)
Potassium: 3.8 mmol/L (ref 3.5–5.1)
Sodium: 141 mmol/L (ref 135–145)
Total Bilirubin: 0.8 mg/dL (ref 0.0–1.2)
Total Protein: 6.9 g/dL (ref 6.5–8.1)

## 2024-04-21 MED ORDER — HEPARIN SOD (PORK) LOCK FLUSH 100 UNIT/ML IV SOLN
500.0000 [IU] | Freq: Once | INTRAVENOUS | Status: AC | PRN
  Administered 2024-04-21: 500 [IU]

## 2024-04-21 MED ORDER — SODIUM CHLORIDE 0.9% FLUSH
10.0000 mL | INTRAVENOUS | Status: DC | PRN
  Administered 2024-04-21: 10 mL

## 2024-04-21 NOTE — Progress Notes (Signed)
 [facility]   [phone] [phone]   Clinic Follow up Note   Patient Care Team: Rodman Clam, DO as PCP - General (Internal Medicine) Sonja Shepherd, MD as Consulting Physician (Oncology) Lujean Sake, MD as Consulting Physician (General Surgery)  Date of Service:  04/21/2024  CHIEF COMPLAINT: f/u of pancreatic cancer  CURRENT THERAPY:  Observation  Oncology History   Pancreatic cancer [facility]) stage IIB, [name], [name], [name] -Diagnosed 08/24/20 on EUS with Dr Kimble Pennant, which showed mass at head of pancreas, s/p stenting, with SMV and PV invasion, biopsy confirmed adenocarcinoma with few malignant-appearing LNs in peripancreatic and porta hepatis region. Staging was negative for metastatic disease.  -she completed neoadjuvant chemo FOLFIRINOX q2 weeks 09/12/20 - 12/29/20. -s/p whipple surgery by Dr Almira Armour on 02/08/21, path showed no residual disease.  -due to the high recurrence risk of pancreatic cancer, she is under close surveillance.  -Her PET scan from September 30, 2023 showed hypermetabolic soft tissue mass at the previous Whipple surgical site, and a small mildly hypermetabolic nodule in the left lung, concerning for metastatic recurrence. -lung biopsy 10/28/2023 confirmed adenocarcinoma, IHC consistent with metastatic pancreatic ca.  -she started chemo NALIRIFOX  on 11/14/23, had a significant diarrhea, required dose reduction. S/p 6 cycles  -restaging CT 02/03/2024 showed good partial response  -she started consolidation SBRT to lung and pancrease on 03/16/2024 and completed on 03/26/2024  Assessment & Plan Recurrent pancreatic cancer Recurrent pancreatic cancer, recently completed chemotherapy and radiation therapy. Currently asymptomatic with good energy levels and weight gain. High risk of recurrence remains. Tumor marker results pending, with previous results normal. Discussed circulating  tumor DNA testing as an advanced diagnostic tool. CT scan planned for July to monitor for recurrence. Discussed potential need for future chemotherapy based on scan results and tumor marker levels. She desires to return to work with flexibility for potential future treatments. - Write a letter for return to work with flexibility for time off for potential future treatments. - Schedule CT scan for early July. - Plan follow-up appointment for the second week of July. - Monitor tumor marker levels and communicate results to her. - Consider circulating tumor DNA testing if indicated.  Plan - She has recovered well from chemotherapy and radiation - I wrote a letter for her to return to work - Follow-up on second week of July with lab, flush and CT scan 1 week before   SUMMARY OF ONCOLOGIC HISTORY: Oncology History Overview Note  Cancer Staging Pancreatic cancer [facility]) Staging form: Exocrine Pancreas, AJCC 8th Edition - Clinical stage from 08/24/2020: Stage IIB (cT2, cN1, cM0) - Signed by Sonja Boone, MD on 08/30/2020 Stage prefix: Initial diagnosis    Pancreatic cancer (HCC)  08/20/2020 Imaging   CT AP 08/20/20  IMPRESSION: 1. 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 Collateral circulation is noted. There is moderate intrahepatic and extrahepatic biliary dilatation which appears to be due to the pancreatic head mass. Pancreatic ductal dilatation is noted as well. 2. Probable 2.7 cm uterine fibroid. 3. Aortic atherosclerosis.   Aortic Atherosclerosis (ICD10-I70.0).   08/20/2020 Tumor Marker   Ca19-9 - 927   08/22/2020 Procedure   ERCP by Dr Lavaughn Portland 08/22/20  IMPRESSION - The major papilla appeared normal. - A biliary  sphincterotomy was performed. - Cells for cytology obtained in the lower third and middle of  the main duct. - One plastic stent was placed into the common bile duct.   FINAL MICROSCOPIC DIAGNOSIS:  - No malignant cells identified  - Benign reactive/reparative changes   08/24/2020 Cancer Staging   Staging form: Exocrine Pancreas, AJCC 8th Edition - Clinical stage from 08/24/2020: Stage IIB (cT2, cN1, cM0) - Signed by Sonja Glenbrook, MD on 08/30/2020   08/24/2020 Procedure   EUS by Dr Kimble Pennant 08/24/20  IMPRESSION - There was no sign of significant pathology in the ampulla. - A few malignant-appearing lymph nodes were visualized in the peripancreatic region and porta hepatis region. - One stent was visualized endosonographically in the common bile duct. - A mass was identified in the pancreatic head. Tissue was obtained from this exam. The preliminary diagnosis is consistent with adenocarcinoma. Invasion into SMV/PV seen. Lymphadenopathy noted. This was staged T3 N1 Mx by endosonographic criteria. Fine needle aspiration performed.   08/24/2020 Initial Biopsy   FINAL MICROSCOPIC DIAGNOSIS: 08/24/20 - Malignant cells consistent with adenocarcinoma    08/30/2020 Initial Diagnosis   Pancreatic cancer (HCC)   09/05/2020 Imaging   CT Chest  IMPRESSION: 1. Stable 2.7 cm infiltrating pancreatic head mass with borderline enlarged peripancreatic lymph nodes. 2. No findings for pulmonary metastatic disease. 3. Small hiatal hernia. 4. Aortic atherosclerosis.   Aortic Atherosclerosis (ICD10-I70.0).   09/08/2020 Procedure   INSERTION PORT-A-CATH by Dr Leighton Punches and Cherlynn Cornfield    09/12/2020 - 12/29/2020 Chemotherapy   Neoadjuvant FOLFIRINOX q2 weeks starting 09/12/20-12/29/20    12/02/2020 Imaging   CT CAP  IMPRESSION: 1. Previously noted mass of the central pancreatic head is almost entirely resolved, difficult to discretely appreciate on current examination. Findings are consistent with treatment response. There remains obstruction of the pancreatic duct near the head neck junction with  mild prominence of the pancreatic duct, measuring up to 5 mm, with atrophy of the distal pancreatic parenchyma. 2. The portal vein, splenic vein, and superior mesenteric vein are now widely patent, previously effaced at the confluence by mass effect. 3. Interval placement of common bile duct stent, tip positioned in the distal duodenum, with relief of previously seen biliary ductal dilatation. 4. 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 Following the bifurcation of the celiac mesenteric trunk, the celiac axis appears to directly traverse the vicinity of the mass, although there does appear to be a fat plane about the vessel. 5. The distal small bowel and colon are diffusely somewhat hyperenhancing and inflamed appearing with vascular combing and a tethered appearance of the distal small bowel. This appearance generally suggests inflammatory bowel disease such as Crohn's disease. Correlate with referable clinical history, if present. No evidence of obstruction or other acute complication. 6. Hepatic steatosis. 7. Aortic atherosclerosis.   12/28/2020 Genetic Testing   Negative hereditary cancer genetic testing: no pathogenic variants detected in [employer] Common Hereditary Cancers Panel.  The report date is December 28, 2020.    The Common Hereditary Cancers Panel offered by [facility] includes sequencing and/or deletion duplication testing of the following 47 genes: APC, ATM, AXIN2, BARD1, BMPR1A, BRCA1, BRCA2, BRIP1, CDH1, CDK4, CDKN2A (p14ARF), CDKN2A (p16INK4a), CHEK2, CTNNA1, DICER1, EPCAM (Deletion/duplication testing only), GREM1 (promoter region deletion/duplication testing only), GREM1, HOXB13, KIT, MEN1, MLH1, MSH2, MSH3, MSH6, MUTYH, NBN, NF1, NHTL1, PALB2, PDGFRA, PMS2, POLD1, POLE,  PTEN, RAD50, RAD51C, RAD51D, SDHA, SDHB,  SDHC, SDHD, SMAD4, SMARCA4. STK11, TP53, TSC1, TSC2, and VHL.  The following genes were evaluated for sequence changes only: SDHA and HOXB13 c.251G>A variant only.   01/17/2021 Imaging   CT [facility] from [facility] IMPRESSION Small lesion in the pancreatic head measuring approximately 1.1 x 0.8 cm without evidence of vascular involvement or metastasis consistent with previously described, biopsy-proven pancreatic adenocarcinoma.   02/08/2021 Surgery   Whipple Surgery with Dr Kirk Peper  Final Pathologic Diagnosis      A.  GALLBLADDER, CHOLECYSTECTOMY: Chronic cholecystitis. No malignancy identified.   B.  BILE DUCT STENT, REMOVAL (GROSS ONLY DIAGNOSIS): Stent, see gross description.   C.  WHIPPLE RESECTION: No residual malignancy identified. Chronic and acute inflammation with granulation tissue reaction, fibrosis and features of chronic pancreatitis, suggestive of therapy effect. Fourteen lymph nodes, negative for metastasis (0/14). Margins negative for malignancy.        06/07/2021 Imaging   CT AP  IMPRESSION: 1. No current findings of recurrent malignancy. Interval Whipple procedure with expected postoperative findings. 2. A 3.5 cm stent is present in the dorsal pancreatic duct. No duct dilatation. There is an approximately 1.5 mm lucency centrally along this stent shown on image 43 series 7, possibilities include stent fracture, two separate stents, or an intentional radial lucency in this type of stent. 3. Small type 1 hiatal hernia. Mild distal esophageal wall thickening may reflect low-grade esophagitis. 4.  Aortic Atherosclerosis (ICD10-I70.0). 5.  Prominent stool throughout the colon favors constipation. 6. Uterine fibroid. 7. 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   11/14/2023 - 01/25/2024 Chemotherapy   Patient is on Treatment Plan : PANCREAS NALIRIFOX D1, 15 Q28D     02/20/2024 - 02/20/2024 Chemotherapy   Patient is on Treatment  Plan : PANCREATIC Abraxane D1,8,15 + Gemcitabine D1,8,15 q28d     Port-A-Cath in place     Discussed the use of AI scribe software for clinical note transcription with the patient, who gave verbal consent to proceed.  History of Present Illness Jacqueline Tyler is a 63 year old female with recurrent pancreatic cancer who presents for follow-up after completing radiation therapy.  She completed radiation therapy four weeks ago and currently feels well with no residual issues. Her energy levels have normalized, and she has resumed her usual eating habits, resulting in a weight gain of ten pounds since March and three pounds since her last visit. There is no swelling of the legs, abdominal pain, or tenderness.     All other systems were reviewed with the patient and are negative.  MEDICAL HISTORY:  Past Medical History:  Diagnosis Date   Anemia    Anxiety    Cancer (HCC) 07/2020   pancreatic cancer   Depression    Diabetes mellitus without complication (HCC) 07/2020   pancreatic cancer   High cholesterol    History of blood transfusion    History of hiatal hernia    small   Hypertension    Stroke [facility])    age 34, no residual effect    SURGICAL HISTORY: Past Surgical History:  Procedure Laterality Date   BILIARY BRUSHING  08/22/2020   Procedure: BILIARY BRUSHING;  Surgeon: Ozell Blunt, MD;  Location: [facility] ENDOSCOPY;  Service: Endoscopy;;   BILIARY STENT PLACEMENT N/A 08/22/2020   Procedure: BILIARY STENT PLACEMENT;  Surgeon: Ozell Blunt, MD;  Location: [facility] ENDOSCOPY;  Service: Endoscopy;  Laterality: N/A;   BIOPSY OF SKIN SUBCUTANEOUS TISSUE  AND/OR MUCOUS MEMBRANE  02/27/2024   Procedure: BIOPSY, SKIN, SUBCUTANEOUS TISSUE, OR MUCOUS MEMBRANE;  Surgeon: Mansouraty, Albino Alu., MD;  Location: [facility] ENDOSCOPY;  Service: Gastroenterology;;   BRONCHIAL BIOPSY  10/28/2023   Procedure: BRONCHIAL BIOPSIES;  Surgeon: Denson Flake, MD;  Location: [facility] ENDOSCOPY;  Service: Pulmonary;;   BRONCHIAL  BRUSHINGS  10/28/2023   Procedure: BRONCHIAL BRUSHINGS;  Surgeon: Denson Flake, MD;  Location: [facility] ENDOSCOPY;  Service: Pulmonary;;   BRONCHIAL NEEDLE ASPIRATION BIOPSY  10/28/2023   Procedure: BRONCHIAL NEEDLE ASPIRATION BIOPSIES;  Surgeon: Denson Flake, MD;  Location: [facility] ENDOSCOPY;  Service: Pulmonary;;   ENDOSCOPIC RETROGRADE CHOLANGIOPANCREATOGRAPHY (ERCP) WITH PROPOFOL  N/A 08/22/2020   Procedure: ENDOSCOPIC RETROGRADE CHOLANGIOPANCREATOGRAPHY (ERCP) WITH PROPOFOL ;  Surgeon: Ozell Blunt, MD;  Location: [facility] ENDOSCOPY;  Service: Endoscopy;  Laterality: N/A;   ESOPHAGOGASTRODUODENOSCOPY (EGD) WITH PROPOFOL  N/A 08/24/2020   Procedure: ESOPHAGOGASTRODUODENOSCOPY (EGD) WITH PROPOFOL ;  Surgeon: Evangeline Hilts, MD;  Location: [facility] ENDOSCOPY;  Service: Endoscopy;  Laterality: N/A;   EUS N/A 02/27/2024   Procedure: ULTRASOUND, UPPER GI TRACT, ENDOSCOPIC;  Surgeon: Brice Campi Albino Alu., MD;  Location: [facility] ENDOSCOPY;  Service: Gastroenterology;  Laterality: N/A;   FIDUCIAL MARKER PLACEMENT  10/28/2023   Procedure: FIDUCIAL MARKER PLACEMENT;  Surgeon: Denson Flake, MD;  Location[facility] ENDOSCOPY;  Service: Pulmonary;;   FIDUCIAL MARKER PLACEMENT  02/27/2024   Procedure: INSERTION, FIDUCIAL MARKERS;  Surgeon: Normie Becton., MD;  Location: [facility] ENDOSCOPY;  Service: Gastroenterology;;   FINE NEEDLE ASPIRATION N/A 08/24/2020   Procedure: FINE NEEDLE ASPIRATION (FNA) LINEAR;  Surgeon: Evangeline Hilts, MD;  Location: [facility] ENDOSCOPY;  Service: Endoscopy;  Laterality: N/A;   PORT-A-CATH REMOVAL N/A 12/08/2021   Procedure: REMOVAL PORT-A-CATH;  Surgeon: Lujean Sake, MD;  Location: [facility] OR;  Service: General;  Laterality: N/A;   PORTACATH PLACEMENT Right 09/08/2020   Procedure: INSERTION PORT-A-CATH;  Surgeon: Lujean Sake, MD;  Location: [facility];  Service: General;  Laterality: Right;   PORTACATH PLACEMENT N/A 11/06/2023   Procedure: INSERTION PORT-A-CATH;  Surgeon: Lujean Sake, MD;   Location: [facility] ORS;  Service: General;  Laterality: N/A;  LMA   SPHINCTEROTOMY  08/22/2020   Procedure: [address];  Surgeon: Ozell Blunt, MD;  Location: [facility] ENDOSCOPY;  Service: Endoscopy;;   UPPER ESOPHAGEAL ENDOSCOPIC ULTRASOUND (EUS) N/A 08/24/2020   Procedure: UPPER ESOPHAGEAL ENDOSCOPIC ULTRASOUND (EUS);  Surgeon: Evangeline Hilts, MD;  Location: [facility];  Service: Endoscopy;  Laterality: N/A;    I have reviewed the social history and family history with the patient and they are unchanged from previous note.  ALLERGIES:  is allergic to irinotecan  liposome, tyloxapol, oxycodone -acetaminophen , and poison ivy extract.  MEDICATIONS:  Current Outpatient Medications  Medication Sig Dispense Refill   acetaminophen  (TYLENOL ) 325 MG tablet Take 650 mg by mouth every 6 (six) hours as needed for moderate pain (pain score 4-6).     busPIRone (BUSPAR) 5 MG tablet Take 5 mg by mouth daily.     citalopram  (CELEXA ) 40 MG tablet Take 40 mg by mouth every evening.     CREON  36000-114000 units CPEP capsule TAKE 2 CAPSULES BY MOUTH 3 TIMES A DAY WITH A MEAL. MAY ALSO TAKE 1 CAPSULE AS NEEDED WITH SNACKS 240 capsule 2   hydrochlorothiazide (MICROZIDE) 12.5 MG capsule Take 12.5 mg by mouth in the morning.     magnesium  oxide (MAG-OX) 400 (241.3 Mg) MG tablet Take 1 tablet (400 mg total) by mouth every evening.     oxyCODONE -acetaminophen  (PERCOCET/ROXICET) 5-325 MG tablet  Take 1 tablet by mouth every 6 (six) hours as needed for severe pain (pain score 7-10). 12 tablet 0   pantoprazole  (PROTONIX ) 40 MG tablet Take 1 tablet (40 mg total) by mouth 2 (two) times daily before a meal. Twice daily for 2 months then once daily thereafter. 60 tablet 11   potassium chloride  (KLOR-CON ) 10 MEQ tablet Take 1 tablet (10 mEq total) by mouth 2 (two) times daily. 60 tablet 1   potassium chloride  SA (KLOR-CON  M) 20 MEQ tablet TAKE 1 TABLET BY MOUTH TWICE A DAY 180 tablet 1   simvastatin (ZOCOR) 80 MG tablet Take 80 mg by  mouth every evening.     No current facility-administered medications for this visit.    PHYSICAL EXAMINATION: ECOG PERFORMANCE STATUS: 1 - Symptomatic but completely ambulatory  Vitals:   04/21/24 0954  BP: 106/64  Pulse: 86  Resp: 15  Temp: 98.2 F (36.8 C)  SpO2: 98%   Wt Readings from Last 3 Encounters:  04/21/24 119 lb 4.8 oz (54.1 kg)  03/09/24 116 lb 8 oz (52.8 kg)  03/03/24 114 lb (51.7 kg)     GENERAL:alert, no distress and comfortable SKIN: skin color, texture, turgor are normal, no rashes or significant lesions EYES: normal, Conjunctiva are pink and non-injected, sclera clear NECK: supple, thyroid normal size, non-tender, without nodularity LYMPH:  no palpable lymphadenopathy in the cervical, axillary  LUNGS: clear to auscultation and percussion with normal breathing effort HEART: regular rate & rhythm and no murmurs and no lower extremity edema ABDOMEN:abdomen soft, non-tender and normal bowel sounds Musculoskeletal:no cyanosis of digits and no clubbing  NEURO: alert & oriented x 3 with fluent speech, no focal motor/sensory deficits  Physical Exam    LABORATORY DATA:  I have reviewed the data as listed    Latest Ref Rng & Units 04/21/2024    9:42 AM 03/09/2024    9:14 AM 02/27/2024   11:47 AM  CBC  WBC 4.0 - 10.5 K/uL 4.6  5.8    Hemoglobin 12.0 - 15.0 g/dL 19.1  9.8  9.9   Hematocrit 36.0 - 46.0 % 32.2  30.6  29.0   Platelets 150 - 400 K/uL 244  244          Latest Ref Rng & Units 04/21/2024    9:42 AM 03/09/2024    9:14 AM 02/27/2024   11:47 AM  CMP  Glucose 70 - 99 mg/dL 478  295  87   BUN 8 - 23 mg/dL 15  13  3    Creatinine 0.44 - 1.00 mg/dL 6.21  3.08  6.57   Sodium 135 - 145 mmol/L 141  142  139   Potassium 3.5 - 5.1 mmol/L 3.8  4.0  3.2   Chloride 98 - 111 mmol/L 109  110  105   CO2 22 - 32 mmol/L 25  25    Calcium  8.9 - 10.3 mg/dL 9.0  8.5    Total Protein 6.5 - 8.1 g/dL 6.9  6.0    Total Bilirubin 0.0 - 1.2 mg/dL 0.8  0.6    Alkaline  Phos 38 - 126 U/L 76  108    AST 15 - 41 U/L 17  39    ALT 0 - 44 U/L 12  36        RADIOGRAPHIC STUDIES: I have personally reviewed the radiological images as listed and agreed with the findings in the report. No results found.    Orders Placed This Encounter  Procedures  CT CHEST ABDOMEN PELVIS W CONTRAST    Standing Status:   Future    Expected Date:   05/21/2024    Expiration Date:   04/21/2025    If indicated for the ordered procedure, I authorize the administration of contrast media per Radiology protocol:   Yes    Does the patient have a contrast media/X-ray dye allergy?:   No    Preferred imaging location?:   [facility]    If indicated for the ordered procedure, I authorize the administration of oral contrast media per Radiology protocol:   Yes   All questions were answered. The patient knows to call the clinic with any problems, questions or concerns. No barriers to learning was detected. The total time spent in the appointment was 30 minutes, including review of chart and various tests results, discussions about plan of care and coordination of care plan     Sonja [place], MD 04/21/2024

## 2024-04-22 ENCOUNTER — Other Ambulatory Visit: Payer: Self-pay

## 2024-04-22 ENCOUNTER — Telehealth: Payer: Self-pay

## 2024-04-22 LAB — CANCER ANTIGEN 19-9: CA 19-9: 36 U/mL — ABNORMAL HIGH (ref 0–35)

## 2024-04-22 NOTE — Telephone Encounter (Signed)
 Spoke with pt via telephone regarding tumor marker results.  Informed pt that Ca19.9 was 36 this week and Dr. Maryalice Smaller would like to continue to monitor.  Stated that Dr. Maryalice Smaller ordered for the pt to have a CT Scan 1 wk prior to next appt at which time labs will be drawn.  Stated Dr. Maryalice Smaller will discuss the results of both the CT Scan and the tumor marker results with pt in clinic as well as discuss the any changes in the pt's treatment/care plan.  Pt verbalized understanding and had no further questions or concerns at this time.

## 2024-05-21 ENCOUNTER — Ambulatory Visit (HOSPITAL_COMMUNITY)
Admission: RE | Admit: 2024-05-21 | Discharge: 2024-05-21 | Disposition: A | Discharge status: Home / Self Care | Source: Ambulatory Visit | Attending: Hematology | Admitting: Hematology

## 2024-05-21 ENCOUNTER — Inpatient Hospital Stay

## 2024-05-21 ENCOUNTER — Inpatient Hospital Stay: Attending: Hematology

## 2024-05-21 DIAGNOSIS — Z90411 Acquired partial absence of pancreas: Secondary | ICD-10-CM | POA: Diagnosis not present

## 2024-05-21 DIAGNOSIS — C250 Malignant neoplasm of head of pancreas: Secondary | ICD-10-CM | POA: Insufficient documentation

## 2024-05-21 DIAGNOSIS — C7800 Secondary malignant neoplasm of unspecified lung: Secondary | ICD-10-CM | POA: Diagnosis not present

## 2024-05-21 DIAGNOSIS — Z95828 Presence of other vascular implants and grafts: Secondary | ICD-10-CM

## 2024-05-21 LAB — CMP (CANCER CENTER ONLY)
ALT: 14 U/L (ref 0–44)
AST: 16 U/L (ref 15–41)
Albumin: 3.9 g/dL (ref 3.5–5.0)
Alkaline Phosphatase: 72 U/L (ref 38–126)
Anion gap: 7 (ref 5–15)
BUN: 17 mg/dL (ref 8–23)
CO2: 25 mmol/L (ref 22–32)
Calcium: 8.9 mg/dL (ref 8.9–10.3)
Chloride: 109 mmol/L (ref 98–111)
Creatinine: 1.03 mg/dL — ABNORMAL HIGH (ref 0.44–1.00)
GFR, Estimated: >60 mL/min (ref >60)
Glucose, Bld: 88 mg/dL (ref 70–99)
Potassium: 3.5 mmol/L (ref 3.5–5.1)
Sodium: 141 mmol/L (ref 135–145)
Total Bilirubin: 0.8 mg/dL (ref 0.0–1.2)
Total Protein: 6.6 g/dL (ref 6.5–8.1)

## 2024-05-21 LAB — CBC WITH DIFFERENTIAL (CANCER CENTER ONLY)
Abs Immature Granulocytes: 0.03 10*3/uL (ref 0.00–0.07)
Basophils Absolute: 0 10*3/uL (ref 0.0–0.1)
Basophils Relative: 1 %
Eosinophils Absolute: 0.2 10*3/uL (ref 0.0–0.5)
Eosinophils Relative: 3 %
HCT: 34.9 % — ABNORMAL LOW (ref 36.0–46.0)
Hemoglobin: 11.1 g/dL — ABNORMAL LOW (ref 12.0–15.0)
Immature Granulocytes: 1 %
Lymphocytes Relative: 17 %
Lymphs Abs: 1.1 10*3/uL (ref 0.7–4.0)
MCH: 28 pg (ref 26.0–34.0)
MCHC: 31.8 g/dL (ref 30.0–36.0)
MCV: 88.1 fL (ref 80.0–100.0)
Monocytes Absolute: 0.6 10*3/uL (ref 0.1–1.0)
Monocytes Relative: 10 %
Neutro Abs: 4.3 10*3/uL (ref 1.7–7.7)
Neutrophils Relative %: 68 %
Platelet Count: 256 10*3/uL (ref 150–400)
RBC: 3.96 MIL/uL (ref 3.87–5.11)
RDW: 13.7 % (ref 11.5–15.5)
WBC Count: 6.3 10*3/uL (ref 4.0–10.5)
nRBC: 0 % (ref 0.0–0.2)

## 2024-05-21 MED ORDER — HEPARIN SOD (PORK) LOCK FLUSH 100 UNIT/ML IV SOLN
500.0000 [IU] | Freq: Once | INTRAVENOUS | Status: AC
  Administered 2024-05-21: 500 [IU] via INTRAVENOUS

## 2024-05-21 MED ORDER — IOHEXOL 9 MG/ML PO SOLN
500.0000 mL | ORAL | Status: AC
  Administered 2024-05-21 (×2): 500 mL via ORAL

## 2024-05-21 MED ORDER — IOHEXOL 300 MG/ML  SOLN
100.0000 mL | Freq: Once | INTRAMUSCULAR | Status: AC | PRN
  Administered 2024-05-21: 100 mL via INTRAVENOUS

## 2024-05-21 MED ORDER — SODIUM CHLORIDE 0.9% FLUSH
10.0000 mL | INTRAVENOUS | Status: DC | PRN
  Administered 2024-05-21: 10 mL

## 2024-05-22 LAB — CANCER ANTIGEN 19-9: CA 19-9: 321 U/mL — ABNORMAL HIGH (ref 0–35)

## 2024-05-27 NOTE — Assessment & Plan Note (Signed)
 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 Staging was negative for metastatic disease.  -she completed neoadjuvant chemo FOLFIRINOX q2 weeks 09/12/20 - 12/29/20. -s/p whipple surgery by Dr Debora on 02/08/21, path showed no residual disease.  -due to the high recurrence risk of pancreatic cancer, she is under close surveillance.  -Her PET scan from September 30, 2023 showed hypermetabolic soft tissue mass at the previous Whipple surgical site, and a small mildly hypermetabolic nodule in the left lung, concerning for metastatic recurrence. -lung biopsy 10/28/2023 confirmed adenocarcinoma, IHC consistent with metastatic pancreatic ca.  -she started chemo NALIRIFOX  on 11/14/23, had a significant diarrhea, required dose reduction. S/p 6 cycles  -restaging CT 02/03/2024 showed good partial response  -she started consolidation SBRT to lung and pancrease on 03/16/2024 and completed on 03/26/2024 -CT 05/21/2024 showed  treatment response in pancreas and lung nodule, but also showed scattered new left upper lobe pulmonary nodules measure up to 6 mm concerning for progressive pulmonary metastatic disease.  Her tumor marker CA 19.9 dropped down to normal previously and is back to 300's now, concerning for disease progression. -I recommend starting chemotherapy again with gemcitabine and Abraxane every 2 weeks, due to her previous poor tolerance to chemo.

## 2024-05-28 ENCOUNTER — Other Ambulatory Visit: Payer: Self-pay

## 2024-05-28 ENCOUNTER — Inpatient Hospital Stay (HOSPITAL_BASED_OUTPATIENT_CLINIC_OR_DEPARTMENT_OTHER): Admitting: Hematology

## 2024-05-28 VITALS — BP 134/71 | HR 68 | Temp 98.9°F | Resp 17 | Ht 61.0 in | Wt 120.4 lb

## 2024-05-28 DIAGNOSIS — C250 Malignant neoplasm of head of pancreas: Secondary | ICD-10-CM | POA: Diagnosis not present

## 2024-05-28 NOTE — Progress Notes (Signed)
 [facility]   [phone] [phone]   Clinic Follow up Note   Patient Care Team: [name], DO as PCP - General (Internal Medicine) Lanny Callander, MD as Consulting Physician (Oncology) Dasie Leonor CROME, MD as Consulting Physician (General Surgery)  Date of Service:  05/28/2024  CHIEF COMPLAINT: f/u of recurrent pancreatic cancer  CURRENT THERAPY:  Observation  Oncology History   Pancreatic cancer [name], [name], [name], [name] -Diagnosed 08/24/20 on EUS with Dr Burnette, which showed mass at head of pancreas, s/p stenting, with SMV and PV invasion, biopsy confirmed adenocarcinoma with few malignant-appearing LNs in peripancreatic and porta hepatis region. Staging was negative for metastatic disease.  -she completed neoadjuvant chemo FOLFIRINOX q2 weeks 09/12/20 - 12/29/20. -s/p whipple surgery by Dr Debora on 02/08/21, path showed no residual disease.  -due to the high recurrence risk of pancreatic cancer, she is under close surveillance.  -Her PET scan from September 30, 2023 showed hypermetabolic soft tissue mass at the previous Whipple surgical site, and a small mildly hypermetabolic nodule in the left lung, concerning for metastatic recurrence. -lung biopsy 10/28/2023 confirmed adenocarcinoma, IHC consistent with metastatic pancreatic ca.  -she started chemo NALIRIFOX  on 11/14/23, had a significant diarrhea, required dose reduction. S/p 6 cycles  -restaging CT 02/03/2024 showed good partial response  -she started consolidation SBRT to lung and pancrease on 03/16/2024 and completed on 03/26/2024 -CT 05/21/2024 showed  treatment response in pancreas and lung nodule, but also showed scattered new left upper lobe pulmonary nodules measure up to 6 mm concerning for progressive pulmonary metastatic disease.  Her tumor marker CA 19.9 dropped down to normal previously and is back to 300's now, concerning  for disease progression. -I recommend starting chemotherapy again with gemcitabine and Abraxane every 2 weeks, due to her previous poor tolerance to chemo.  Assessment & Plan Recurrent pancreatic cancer Recurrent pancreatic cancer with early disease progression. CA19.9 tumor marker increased to 300, indicating potential tumor growth. CT scan shows shrinkage of previous soft tissue mass and lung lesion but reveals new small lung nodules, suggesting microscopic disease progression. - Schedule CT scan in mid to late September to assess disease progression. - Consider starting chemotherapy with gemcitabine and abraxane if scan shows disease progression or if CA19.9 continues to rise. - Discuss potential side effects of gemcitabine and abraxane, including fatigue, alopecia, and risk of infection. - Plan for chemotherapy administration every two weeks to allow more recovery time, considering previous chemotherapy tolerance. - Coordinate chemotherapy schedule to accommodate work, potentially on Thursdays or Fridays.  Chemotherapy side effects Previous chemotherapy regimen caused significant side effects, including neuropathy and taste changes, which have resolved. Current regimen of gemcitabine and abraxane is expected to be better tolerated, with less severe side effects compared to previous treatments. - Monitor for side effects of new chemotherapy regimen, including fatigue, alopecia, and risk of infection. - Adjust chemotherapy schedule to every two weeks to minimize side effects and improve tolerance.  [name], has returned to work - I personally reviewed her restaging CT scan images, no significant disease progression, except a few more tiny lung nodules.  However her tumor marker has increased significantly, likely microscopic disease progression. - We discussed restarting chemotherapy now versus waiting for 2 more months.  Due to her previous poor tolerance to  chemo, and how she feels now, we decided to hold chemotherapy now and repeat a CT  scan in 2 months.  She does not have gross disease progression on next CT scan, will restart chemotherapy after next visit. - Follow-up in 2 months with lab and CT scan.   SUMMARY OF ONCOLOGIC HISTORY: Oncology History Overview Note  Cancer Staging Pancreatic cancer [facility]) Staging form: Exocrine Pancreas, AJCC 8th Edition - Clinical stage from 08/24/2020: Stage IIB (cT2, cN1, cM0) - Signed by Lanny Callander, MD on 08/30/2020 Stage prefix: Initial diagnosis    Pancreatic cancer (HCC)  08/20/2020 Imaging   CT AP 08/20/20  IMPRESSION: 1. 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 Collateral circulation is noted. There is moderate intrahepatic and extrahepatic biliary dilatation which appears to be due to the pancreatic head mass. Pancreatic ductal dilatation is noted as well. 2. Probable 2.7 cm uterine fibroid. 3. Aortic atherosclerosis.   Aortic Atherosclerosis (ICD10-I70.0).   08/20/2020 Tumor Marker   Ca19-9 - 927   08/22/2020 Procedure   ERCP by Dr Rosalie 08/22/20  IMPRESSION - The major papilla appeared normal. - A biliary sphincterotomy was performed. - Cells for cytology obtained in the lower third and middle of the main duct. - One plastic stent was placed into the common bile duct.   FINAL MICROSCOPIC DIAGNOSIS:  - No malignant cells identified  - Benign reactive/reparative changes   08/24/2020 Cancer Staging   Staging form: Exocrine Pancreas, AJCC 8th Edition - Clinical stage from 08/24/2020: Stage IIB (cT2, cN1, cM0) - Signed by Lanny Callander, MD on 08/30/2020   08/24/2020 Procedure   EUS by Dr burnette 08/24/20  IMPRESSION - There was no sign of significant pathology in the ampulla. - A few malignant-appearing lymph  nodes were visualized in the peripancreatic region and porta hepatis region. - One stent was visualized endosonographically in the common bile duct. - A mass was identified in the pancreatic head. Tissue was obtained from this exam. The preliminary diagnosis is consistent with adenocarcinoma. Invasion into SMV/PV seen. Lymphadenopathy noted. This was staged T3 N1 Mx by endosonographic criteria. Fine needle aspiration performed.   08/24/2020 Initial Biopsy   FINAL MICROSCOPIC DIAGNOSIS: 08/24/20 - Malignant cells consistent with adenocarcinoma    08/30/2020 Initial Diagnosis   Pancreatic cancer (HCC)   09/05/2020 Imaging   CT Chest  IMPRESSION: 1. Stable 2.7 cm infiltrating pancreatic head mass with borderline enlarged peripancreatic lymph nodes. 2. No findings for pulmonary metastatic disease. 3. Small hiatal hernia. 4. Aortic atherosclerosis.   Aortic Atherosclerosis (ICD10-I70.0).   09/08/2020 Procedure   INSERTION PORT-A-CATH by Dr Dasie and Aron    09/12/2020 - 12/29/2020 Chemotherapy   Neoadjuvant FOLFIRINOX q2 weeks starting 09/12/20-12/29/20    12/02/2020 Imaging   CT CAP  IMPRESSION: 1. Previously noted mass of the central pancreatic head is almost entirely resolved, difficult to discretely appreciate on current examination. Findings are consistent with treatment response. There remains obstruction of the pancreatic duct near the head neck junction with mild prominence of the pancreatic duct, measuring up to 5 mm, with atrophy of the distal pancreatic parenchyma. 2. The portal vein, splenic vein, and superior mesenteric vein are now widely patent, previously effaced at the confluence by mass effect. 3. Interval placement of common bile duct stent, tip positioned in the distal duodenum, with relief of previously seen biliary ductal dilatation. 4. 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 Following the bifurcation of the celiac mesenteric trunk, the celiac axis appears to directly traverse the vicinity of the mass, although there does appear to be a fat plane about the vessel. 5. The distal small bowel and colon are diffusely somewhat hyperenhancing and inflamed appearing with vascular combing and a tethered appearance of the distal small bowel. This appearance generally suggests inflammatory bowel disease such as Crohn's disease. Correlate with referable clinical history, if present. No evidence of obstruction or other acute complication. 6. Hepatic steatosis. 7. Aortic atherosclerosis.   12/28/2020 Genetic Testing   Negative hereditary cancer genetic testing: no pathogenic variants detected in [employer] Common Hereditary Cancers Panel.  The report date is December 28, 2020.    The Common Hereditary Cancers Panel offered by [employer] includes sequencing and/or deletion duplication testing of the following 47 genes: APC, ATM, AXIN2, BARD1, BMPR1A, BRCA1, BRCA2, BRIP1, CDH1, CDK4, CDKN2A (p14ARF), CDKN2A (p16INK4a), CHEK2, CTNNA1, DICER1, EPCAM (Deletion/duplication testing only), GREM1 (promoter region deletion/duplication testing only), GREM1, HOXB13, KIT, MEN1, MLH1, MSH2, MSH3, MSH6, MUTYH, NBN, NF1, NHTL1, PALB2, PDGFRA, PMS2, POLD1, POLE, PTEN, RAD50, RAD51C, RAD51D, SDHA, SDHB, SDHC, SDHD, SMAD4, SMARCA4. STK11, TP53, TSC1, TSC2, and VHL.  The following genes were evaluated for sequence changes only: SDHA and HOXB13 c.251G>A variant only.   01/17/2021 Imaging   CT [facility] IMPRESSION Small lesion in the pancreatic head measuring approximately 1.1 x 0.8 cm without evidence of vascular involvement or metastasis consistent with previously described, biopsy-proven pancreatic adenocarcinoma.   02/08/2021 Surgery   Whipple Surgery with Dr Abran Kallman  Final Pathologic Diagnosis      A.   GALLBLADDER, CHOLECYSTECTOMY: Chronic cholecystitis. No malignancy identified.   B.  BILE DUCT STENT, REMOVAL (GROSS ONLY DIAGNOSIS): Stent, see gross description.   C.  WHIPPLE RESECTION: No residual malignancy identified. Chronic and acute inflammation with granulation tissue reaction, fibrosis and features of chronic pancreatitis, suggestive of therapy effect. Fourteen lymph nodes, negative for metastasis (0/14). Margins negative for malignancy.        06/07/2021 Imaging   CT AP  IMPRESSION: 1. No current findings of recurrent malignancy. Interval Whipple procedure with expected postoperative findings. 2. A 3.5 cm stent is present in the dorsal pancreatic duct. No duct dilatation. There is an approximately 1.5 mm lucency centrally along this stent shown on image 43 series 7, possibilities include stent fracture, two separate stents, or an intentional radial lucency in this type of stent. 3. Small type 1 hiatal hernia. Mild distal esophageal wall thickening may reflect low-grade esophagitis. 4.  Aortic Atherosclerosis (ICD10-I70.0). 5.  Prominent stool throughout the colon favors constipation. 6. Uterine fibroid. 7. 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   11/14/2023 - 01/25/2024 Chemotherapy   Patient is on Treatment Plan : PANCREAS NALIRIFOX D1, 15 Q28D     02/20/2024 - 02/20/2024 Chemotherapy   Patient is on Treatment Plan : PANCREATIC Abraxane D1,8,15 + Gemcitabine D1,8,15 q28d     Port-A-Cath in place     Discussed the use of AI scribe software for clinical note transcription with the patient, who gave verbal consent to proceed.  History of Present Illness Jacqueline Tyler is a 63 year old female with recurrent pancreatic cancer who presents for follow-up.  Her tumor marker CA19.9 has increased to 300. She reports feeling 'wonderful' and has no residual problems from  previous chemotherapy, such as neuropathy, numbness, or  tingling. Her taste buds have returned, and she has gained a few pounds since April, maintaining a weight between 120 to 125 pounds. She experiences no pain or stomach issues. She has been back to work since June 25th, working from home, which offers flexibility.     All other systems were reviewed with the patient and are negative.  MEDICAL HISTORY:  Past Medical History:  Diagnosis Date   Anemia    Anxiety    Cancer (HCC) 07/2020   pancreatic cancer   Depression    Diabetes mellitus without complication (HCC) 07/2020   pancreatic cancer   High cholesterol    History of blood transfusion    History of hiatal hernia    small   Hypertension    Stroke [facility])    age 89, no residual effect    SURGICAL HISTORY: Past Surgical History:  Procedure Laterality Date   BILIARY BRUSHING  08/22/2020   Procedure: BILIARY BRUSHING;  Surgeon: Rosalie Kitchens, MD;  Location: [facility] ENDOSCOPY;  Service: Endoscopy;;   BILIARY STENT PLACEMENT N/A 08/22/2020   Procedure: BILIARY STENT PLACEMENT;  Surgeon: Rosalie Kitchens, MD;  Location: [facility] ENDOSCOPY;  Service: Endoscopy;  Laterality: N/A;   BIOPSY OF SKIN SUBCUTANEOUS TISSUE AND/OR MUCOUS MEMBRANE  02/27/2024   Procedure: BIOPSY, SKIN, SUBCUTANEOUS TISSUE, OR MUCOUS MEMBRANE;  Surgeon: Mansouraty, Aloha Raddle., MD;  Location: [facility] ENDOSCOPY;  Service: Gastroenterology;;   BRONCHIAL BIOPSY  10/28/2023   Procedure: BRONCHIAL BIOPSIES;  Surgeon: Shelah Lamar RAMAN, MD;  Location: [facility] ENDOSCOPY;  Service: Pulmonary;;   BRONCHIAL BRUSHINGS  10/28/2023   Procedure: BRONCHIAL BRUSHINGS;  Surgeon: Shelah Lamar RAMAN, MD;  Location: [facility] ENDOSCOPY;  Service: Pulmonary;;   BRONCHIAL NEEDLE ASPIRATION BIOPSY  10/28/2023   Procedure: BRONCHIAL NEEDLE ASPIRATION BIOPSIES;  Surgeon: Shelah Lamar RAMAN, MD;  Location: [facility] ENDOSCOPY;  Service: Pulmonary;;   ENDOSCOPIC RETROGRADE CHOLANGIOPANCREATOGRAPHY (ERCP) WITH PROPOFOL  N/A 08/22/2020   Procedure: ENDOSCOPIC RETROGRADE CHOLANGIOPANCREATOGRAPHY  (ERCP) WITH PROPOFOL ;  Surgeon: Rosalie Kitchens, MD;  Location: [facility]OS[facility];  Service: Endoscopy;  Laterality: N/A;   ESOPHAGOGASTRODUODENOSCOPY (EGD) WITH PROPOFOL  N/A 08/24/2020   Procedure: ESOPHAGOGASTRODUODENOSCOPY (EGD) WITH PROPOFOL ;  Surgeon: Burnette Fallow, MD;  Location: [facility] ENDOSCOPY;  Service: Endoscopy;  Laterality: N/A;   EUS N/A 02/27/2024   Procedure: ULTRASOUND, UPPER GI TRACT, ENDOSCOPIC;  Surgeon: Wilhelmenia Aloha Raddle., MD;  Location: [facility] ENDOS[facility];  Service: Gastroenterology;  Laterality: N/A;   FIDUCIAL MARKER PLACEMENT  10/28/2023   Procedure: FIDUCIAL MARKER PLACEMENT;  Surgeon: Shelah Lamar RAMAN, MD;  Location: [facility] ENDOSCOPY;  Service: Pulmonary;;   FIDUCIAL MARKER PLACEMENT  02/27/2024   Procedure: INSERTION, FIDUCIAL MARKERS;  Surgeon: Wilhelmenia Aloha Raddle., MD;  Location: [facility];  Service: Gastroenterology;;   FINE NEEDLE ASPIRATION N/A 08/24/2020   Procedure: FINE NEEDLE ASPIRATION (FNA) LINEAR;  Surgeon: Burnette Fallow, MD;  Location: [facility]OS[facility];  Service: Endoscopy;  Laterality: N/A;   PORT-A-CATH REMOVAL N/A 12/08/2021   Procedure: REMOVAL PORT-A-CATH;  Surgeon: Dasie Leonor CROME, MD;  Location[facility] OR;  Service: General;  Laterality: N/A;   PORTACATH PLACEMENT Right 09/08/2020   Procedure: INSERTION PORT-A-CATH;  Surgeon: Dasie Leonor CROME, MD;  Location: [facility];  Service: General;  Laterality: Right;   PORTACATH PLACEMENT N/A 11/06/2023   Procedure: INSERTION PORT-A-CATH;  Surgeon: Dasie Leonor CROME, MD;  Location: [facility] ORS;  Service: General;  Laterality: N/A;  LMA   SPHINCTEROTOMY  08/22/2020   Procedure: ANNETT;  Surgeon: Rosalie Kitchens, MD;  Location:  [facility] ENDOSCOPY;  Service: Endoscopy;;   UPPER ESOPHAGEAL ENDOSCOPIC ULTRASOUND (EUS) N/A 08/24/2020   Procedure: UPPER ESOPHAGEAL ENDOSCOPIC ULTRASOUND (EUS);  Surgeon: Burnette Fallow, MD;  Location: [facility];  Service: Endoscopy;  Laterality: N/A;    I have reviewed the social history and  family history with the patient and they are unchanged from previous note.  ALLERGIES:  is allergic to irinotecan  liposome, tyloxapol, oxycodone -acetaminophen , and poison ivy extract.  MEDICATIONS:  Current Outpatient Medications  Medication Sig Dispense Refill   acetaminophen  (TYLENOL ) 325 MG tablet Take 650 mg by mouth every 6 (six) hours as needed for moderate pain (pain score 4-6).     busPIRone (BUSPAR) 5 MG tablet Take 5 mg by mouth daily.     citalopram  (CELEXA ) 40 MG tablet Take 40 mg by mouth every evening.     CREON  36000-114000 units CPEP capsule TAKE 2 CAPSULES BY MOUTH 3 TIMES A DAY WITH A MEAL. MAY ALSO TAKE 1 CAPSULE AS NEEDED WITH SNACKS 240 capsule 2   hydrochlorothiazide (MICROZIDE) 12.5 MG capsule Take 12.5 mg by mouth in the morning.     magnesium  oxide (MAG-OX) 400 (241.3 Mg) MG tablet Take 1 tablet (400 mg total) by mouth every evening.     oxyCODONE -acetaminophen  (PERCOCET/ROXICET) 5-325 MG tablet Take 1 tablet by mouth every 6 (six) hours as needed for severe pain (pain score 7-10). 12 tablet 0   pantoprazole  (PROTONIX ) 40 MG tablet Take 1 tablet (40 mg total) by mouth 2 (two) times daily before a meal. Twice daily for 2 months then once daily thereafter. 60 tablet 11   potassium chloride  (KLOR-CON ) 10 MEQ tablet Take 1 tablet (10 mEq total) by mouth 2 (two) times daily. 60 tablet 1   potassium chloride  SA (KLOR-CON  M) 20 MEQ tablet TAKE 1 TABLET BY MOUTH TWICE A DAY 180 tablet 1   simvastatin (ZOCOR) 80 MG tablet Take 80 mg by mouth every evening.     No current facility-administered medications for this visit.    PHYSICAL EXAMINATION: ECOG PERFORMANCE STATUS: 0 - Asymptomatic  Vitals:   05/28/24 1024  BP: 134/71  Pulse: 68  Resp: 17  Temp: 98.9 F (37.2 C)  SpO2: 99%   Wt Readings from Last 3 Encounters:  05/28/24 54.6 kg (120 lb 6.4 oz)  04/21/24 54.1 kg (119 lb 4.8 oz)  03/09/24 52.8 kg (116 lb 8 oz)     GENERAL:alert, no distress and  comfortable SKIN: skin color, texture, turgor are normal, no rashes or significant lesions EYES: normal, Conjunctiva are pink and non-injected, sclera clear NECK: supple, thyroid normal size, non-tender, without nodularity LYMPH:  no palpable lymphadenopathy in the cervical, axillary  LUNGS: clear to auscultation and percussion with normal breathing effort HEART: regular rate & rhythm and no murmurs and no lower extremity edema ABDOMEN:abdomen soft, non-tender and normal bowel sounds Musculoskeletal:no cyanosis of digits and no clubbing  NEURO: alert & oriented x 3 with fluent speech, no focal motor/sensory deficits  Physical Exam .  LABORATORY DATA:  I have reviewed the data as listed    Latest Ref Rng & Units 05/21/2024    7:37 AM 04/21/2024    9:42 AM 03/09/2024    9:14 AM  CBC  WBC 4.0 - 10.5 K/uL 6.3  4.6  5.8   Hemoglobin 12.0 - 15.0 g/dL 88.8  89.2  9.8   Hematocrit 36.0 - 46.0 % 34.9  32.2  30.6   Platelets 150 - 400 K/uL 256  244  244  Latest Ref Rng & Units 05/21/2024    7:37 AM 04/21/2024    9:42 AM 03/09/2024    9:14 AM  CMP  Glucose 70 - 99 mg/dL 88  855  868   BUN 8 - 23 mg/dL 17  15  13    Creatinine 0.44 - 1.00 mg/dL 8.96  9.10  9.13   Sodium 135 - 145 mmol/L 141  141  142   Potassium 3.5 - 5.1 mmol/L 3.5  3.8  4.0   Chloride 98 - 111 mmol/L 109  109  110   CO2 22 - 32 mmol/L 25  25  25    Calcium  8.9 - 10.3 mg/dL 8.9  9.0  8.5   Total Protein 6.5 - 8.1 g/dL 6.6  6.9  6.0   Total Bilirubin 0.0 - 1.2 mg/dL 0.8  0.8  0.6   Alkaline Phos 38 - 126 U/L 72  76  108   AST 15 - 41 U/L 16  17  39   ALT 0 - 44 U/L 14  12  36       RADIOGRAPHIC STUDIES: I have personally reviewed the radiological images as listed and agreed with the findings in the report. No results found.    Orders Placed This Encounter  Procedures   CT CHEST ABDOMEN PELVIS W CONTRAST    Standing Status:   Future    Expected Date:   07/29/2024    Expiration Date:   05/28/2025    If  indicated for the ordered procedure, I authorize the administration of contrast media per Radiology protocol:   Yes    Does the patient have a contrast media/X-ray dye allergy?:   No    Preferred imaging location?:   [facility]    If indicated for the ordered procedure, I authorize the administration of oral contrast media per Radiology protocol:   Yes   All questions were answered. The patient knows to call the clinic with any problems, questions or concerns. No barriers to learning was detected. The total time spent in the appointment was 30 minutes, including review of chart and various tests results, discussions about plan of care and coordination of care plan     Onita Mattock, MD 05/28/2024

## 2024-07-20 ENCOUNTER — Other Ambulatory Visit: Payer: Self-pay | Admitting: Hematology

## 2024-07-29 ENCOUNTER — Inpatient Hospital Stay: Attending: Hematology

## 2024-07-29 ENCOUNTER — Ambulatory Visit (HOSPITAL_COMMUNITY)
Admission: RE | Admit: 2024-07-29 | Discharge: 2024-07-29 | Disposition: A | Discharge status: Home / Self Care | Source: Ambulatory Visit | Attending: Hematology | Admitting: Hematology

## 2024-07-29 DIAGNOSIS — C7801 Secondary malignant neoplasm of right lung: Secondary | ICD-10-CM | POA: Insufficient documentation

## 2024-07-29 DIAGNOSIS — C250 Malignant neoplasm of head of pancreas: Secondary | ICD-10-CM | POA: Insufficient documentation

## 2024-07-29 DIAGNOSIS — D45 Polycythemia vera: Secondary | ICD-10-CM | POA: Insufficient documentation

## 2024-07-29 DIAGNOSIS — C7802 Secondary malignant neoplasm of left lung: Secondary | ICD-10-CM | POA: Diagnosis not present

## 2024-07-29 DIAGNOSIS — C786 Secondary malignant neoplasm of retroperitoneum and peritoneum: Secondary | ICD-10-CM | POA: Insufficient documentation

## 2024-07-29 LAB — CBC WITH DIFFERENTIAL (CANCER CENTER ONLY)
Abs Immature Granulocytes: 0.02 K/uL (ref 0.00–0.07)
Basophils Absolute: 0 K/uL (ref 0.0–0.1)
Basophils Relative: 1 %
Eosinophils Absolute: 0.1 K/uL (ref 0.0–0.5)
Eosinophils Relative: 1 %
HCT: 33.8 % — ABNORMAL LOW (ref 36.0–46.0)
Hemoglobin: 11 g/dL — ABNORMAL LOW (ref 12.0–15.0)
Immature Granulocytes: 0 %
Lymphocytes Relative: 12 %
Lymphs Abs: 0.9 K/uL (ref 0.7–4.0)
MCH: 28.6 pg (ref 26.0–34.0)
MCHC: 32.5 g/dL (ref 30.0–36.0)
MCV: 87.8 fL (ref 80.0–100.0)
Monocytes Absolute: 0.6 K/uL (ref 0.1–1.0)
Monocytes Relative: 9 %
Neutro Abs: 5.6 K/uL (ref 1.7–7.7)
Neutrophils Relative %: 77 %
Platelet Count: 317 K/uL (ref 150–400)
RBC: 3.85 MIL/uL — ABNORMAL LOW (ref 3.87–5.11)
RDW: 14.4 % (ref 11.5–15.5)
WBC Count: 7.3 K/uL (ref 4.0–10.5)
nRBC: 0 % (ref 0.0–0.2)

## 2024-07-29 LAB — CMP (CANCER CENTER ONLY)
ALT: 14 U/L (ref 0–44)
AST: 18 U/L (ref 15–41)
Albumin: 4.1 g/dL (ref 3.5–5.0)
Alkaline Phosphatase: 78 U/L (ref 38–126)
Anion gap: 9 (ref 5–15)
BUN: 10 mg/dL (ref 8–23)
CO2: 26 mmol/L (ref 22–32)
Calcium: 9 mg/dL (ref 8.9–10.3)
Chloride: 107 mmol/L (ref 98–111)
Creatinine: 0.91 mg/dL (ref 0.44–1.00)
GFR, Estimated: >60 mL/min (ref >60)
Glucose, Bld: 88 mg/dL (ref 70–99)
Potassium: 3.4 mmol/L — ABNORMAL LOW (ref 3.5–5.1)
Sodium: 142 mmol/L (ref 135–145)
Total Bilirubin: 1.2 mg/dL (ref 0.0–1.2)
Total Protein: 6.8 g/dL (ref 6.5–8.1)

## 2024-07-29 MED ORDER — HEPARIN SOD (PORK) LOCK FLUSH 100 UNIT/ML IV SOLN
500.0000 [IU] | Freq: Once | INTRAVENOUS | Status: AC
  Administered 2024-07-29: 500 [IU] via INTRAVENOUS

## 2024-07-29 MED ORDER — SODIUM CHLORIDE (PF) 0.9 % IJ SOLN
INTRAMUSCULAR | Status: AC
  Filled 2024-07-29: qty 50

## 2024-07-29 MED ORDER — IOHEXOL 300 MG/ML  SOLN
100.0000 mL | Freq: Once | INTRAMUSCULAR | Status: AC | PRN
  Administered 2024-07-29: 100 mL via INTRAVENOUS

## 2024-07-29 MED ORDER — HEPARIN SOD (PORK) LOCK FLUSH 100 UNIT/ML IV SOLN
INTRAVENOUS | Status: AC
  Filled 2024-07-29: qty 5

## 2024-07-30 LAB — CANCER ANTIGEN 19-9: CA 19-9: 7281 U/mL — ABNORMAL HIGH (ref 0–35)

## 2024-08-05 NOTE — Progress Notes (Signed)
 Spoke with Jacqueline Tyler in [facility] Radiology Reading Room regarding expediting the read on this pt's CT Scan done on 07/29/2024.  Jacqueline Tyler stated her would expedite the stated and notify the Radiologist.  Pt is scheduled for a f/u on 08/10/2024 to go over the CT Scan results.

## 2024-08-09 NOTE — Assessment & Plan Note (Signed)
 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 Staging was negative for metastatic disease.  -she completed neoadjuvant chemo FOLFIRINOX q2 weeks 09/12/20 - 12/29/20. -s/p whipple surgery by Dr Debora on 02/08/21, path showed no residual disease.  -due to the high recurrence risk of pancreatic cancer, she is under close surveillance.  -Her PET scan from September 30, 2023 showed hypermetabolic soft tissue mass at the previous Whipple surgical site, and a small mildly hypermetabolic nodule in the left lung, concerning for metastatic recurrence. -lung biopsy 10/28/2023 confirmed adenocarcinoma, IHC consistent with metastatic pancreatic ca.  -she started chemo NALIRIFOX  on 11/14/23, had a significant diarrhea, required dose reduction. S/p 6 cycles  -restaging CT 02/03/2024 showed good partial response  -she started consolidation SBRT to lung and pancrease on 03/16/2024 and completed on 03/26/2024 -CT 05/21/2024 showed  treatment response in pancreas and lung nodule, but also showed scattered new left upper lobe pulmonary nodules measure up to 6 mm concerning for progressive pulmonary metastatic disease.  Her tumor marker CA 19.9 dropped down to normal previously and is back to 300's now, concerning for disease progression. She underwent bronchoscopy biopsy which confirmed metastatic recurrence  -she was asymptomatic and reluctant to have chemo again, so we decided to wait for 2-3 months. -repeated CT 07/29/24 showed disease progression in lung and new peritoneal metastasis -I recommend starting chemotherapy again with gemcitabine and Abraxane every 2 weeks, due to her previous poor tolerance to chemo.

## 2024-08-10 ENCOUNTER — Inpatient Hospital Stay (HOSPITAL_BASED_OUTPATIENT_CLINIC_OR_DEPARTMENT_OTHER): Admitting: Hematology

## 2024-08-10 VITALS — BP 116/60 | HR 56 | Temp 98.4°F | Resp 17 | Wt 121.1 lb

## 2024-08-10 DIAGNOSIS — C250 Malignant neoplasm of head of pancreas: Secondary | ICD-10-CM | POA: Diagnosis not present

## 2024-08-10 NOTE — Progress Notes (Signed)
 [facility]   [phone] Fax:([phone]   Clinic Follow up Note   Patient Care Team: [name], DO as PCP - General (Internal Medicine) Lanny Callander, MD as Consulting Physician (Oncology) Dasie Leonor CROME, MD as Consulting Physician (General Surgery)  Date of Service:  08/10/2024  CHIEF COMPLAINT: f/u of recurrent pancreatic cancer  CURRENT THERAPY:  Pending chemotherapy gemcitabine and Abraxane every 2 weeks  Oncology History   Pancreatic cancer (HCC) stage IIB, [name], [name], [name] -Diagnosed 08/24/20 on EUS with Dr Burnette, which showed mass at head of pancreas, s/p stenting, with SMV and PV invasion, biopsy confirmed adenocarcinoma with few malignant-appearing LNs in peripancreatic and porta hepatis region. Staging was negative for metastatic disease.  -she completed neoadjuvant chemo FOLFIRINOX q2 weeks 09/12/20 - 12/29/20. -s/p whipple surgery by Dr Debora on 02/08/21, path showed no residual disease.  -due to the high recurrence risk of pancreatic cancer, she is under close surveillance.  -Her PET scan from September 30, 2023 showed hypermetabolic soft tissue mass at the previous Whipple surgical site, and a small mildly hypermetabolic nodule in the left lung, concerning for metastatic recurrence. -lung biopsy 10/28/2023 confirmed adenocarcinoma, IHC consistent with metastatic pancreatic ca.  -she started chemo NALIRIFOX  on 11/14/23, had a significant diarrhea, required dose reduction. S/p 6 cycles  -restaging CT 02/03/2024 showed good partial response  -she started consolidation SBRT to lung and pancrease on 03/16/2024 and completed on 03/26/2024 -CT 05/21/2024 showed  treatment response in pancreas and lung nodule, but also showed scattered new left upper lobe pulmonary nodules measure up to 6 mm concerning for progressive pulmonary metastatic disease.  Her tumor marker CA 19.9 dropped down to normal  previously and is back to 300's now, concerning for disease progression. She underwent bronchoscopy biopsy which confirmed metastatic recurrence  -she was asymptomatic and reluctant to have chemo again, so we decided to wait for 2-3 months. -repeated CT 07/29/24 showed disease progression in lung and new peritoneal metastasis -I recommend starting chemotherapy again with gemcitabine and Abraxane every 2 weeks, due to her previous poor tolerance to chemo.  Assessment & Plan Metastatic pancreatic cancer with peritoneal and pulmonary involvement Metastatic pancreatic cancer with progression in the lungs and new peritoneal metastases. CT scan shows new and enlarging pulmonary nodules in both upper lobes, and abdominal CT reveals fluid and nodules outside the bowel, indicating metastatic disease in the abdomen and pelvis. The pancreas is well-managed. The disease is stage IV, treatable to improve symptoms and prolong life. - Initiate chemotherapy with gemcitabine and Abraxane every two weeks, starting next Friday. - Request insurance approval for treatment plan. - Advise wearing a mask during indoor activities and consider flu and COVID vaccinations. - Discuss potential side effects of chemotherapy, including low blood counts, risk of infection, hair loss, neuropathy, nausea, vomiting, fatigue, and low appetite. - Provide anti-nausea medication prescription.  Abdominal bloating and episodic pain due to peritoneal metastases Abdominal bloating and episodic sharp pain likely due to peritoneal metastases, with symptoms occurring daily and pain rated as 10/10 at its worst. Bloating and pain are attributed to fluid accumulation and tumor cells in the peritoneum. - Manage symptoms with chemotherapy as per treatment plan for metastatic pancreatic cancer.  Plan - I personally reviewed her restaging CT scan images with patient and her husband, unfortunately she has cancer progression. - Will start a second line  chemotherapy gemcitabine and Abraxane every 2 weeks next week, will slightly  reduce her dose due to her previous poor tolerance to chemo.   SUMMARY OF ONCOLOGIC HISTORY: Oncology History Overview Note  Cancer Staging Pancreatic cancer [facility]) Staging form: Exocrine Pancreas, AJCC 8th Edition - Clinical stage from 08/24/2020: Stage IIB (cT2, cN1, cM0) - Signed by Lanny Callander, MD on 08/30/2020 Stage prefix: Initial diagnosis    Pancreatic cancer (HCC)  08/20/2020 Imaging   CT AP 08/20/20  IMPRESSION: 1. 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 Collateral circulation is noted. There is moderate intrahepatic and extrahepatic biliary dilatation which appears to be due to the pancreatic head mass. Pancreatic ductal dilatation is noted as well. 2. Probable 2.7 cm uterine fibroid. 3. Aortic atherosclerosis.   Aortic Atherosclerosis (ICD10-I70.0).   08/20/2020 Tumor Marker   Ca19-9 - 927   08/22/2020 Procedure   ERCP by Dr Rosalie 08/22/20  IMPRESSION - The major papilla appeared normal. - A biliary sphincterotomy was performed. - Cells for cytology obtained in the lower third and middle of the main duct. - One plastic stent was placed into the common bile duct.   FINAL MICROSCOPIC DIAGNOSIS:  - No malignant cells identified  - Benign reactive/reparative changes   08/24/2020 Cancer Staging   Staging form: Exocrine Pancreas, AJCC 8th Edition - Clinical stage from 08/24/2020: Stage IIB (cT2, cN1, cM0) - Signed by Lanny Callander, MD on 08/30/2020   08/24/2020 Procedure   EUS by Dr burnette 08/24/20  IMPRESSION - There was no sign of significant pathology in the ampulla. - A few malignant-appearing lymph nodes were visualized in the peripancreatic region and porta hepatis region. - One stent was visualized  endosonographically in the common bile duct. - A mass was identified in the pancreatic head. Tissue was obtained from this exam. The preliminary diagnosis is consistent with adenocarcinoma. Invasion into SMV/PV seen. Lymphadenopathy noted. This was staged T3 N1 Mx by endosonographic criteria. Fine needle aspiration performed.   08/24/2020 Initial Biopsy   FINAL MICROSCOPIC DIAGNOSIS: 08/24/20 - Malignant cells consistent with adenocarcinoma    08/30/2020 Initial Diagnosis   Pancreatic cancer (HCC)   09/05/2020 Imaging   CT Chest  IMPRESSION: 1. Stable 2.7 cm infiltrating pancreatic head mass with borderline enlarged peripancreatic lymph nodes. 2. No findings for pulmonary metastatic disease. 3. Small hiatal hernia. 4. Aortic atherosclerosis.   Aortic Atherosclerosis (ICD10-I70.0).   09/08/2020 Procedure   INSERTION PORT-A-CATH by Dr Dasie and Aron    09/12/2020 - 12/29/2020 Chemotherapy   Neoadjuvant FOLFIRINOX q2 weeks starting 09/12/20-12/29/20    12/02/2020 Imaging   CT CAP  IMPRESSION: 1. Previously noted mass of the central pancreatic head is almost entirely resolved, difficult to discretely appreciate on current examination. Findings are consistent with treatment response. There remains obstruction of the pancreatic duct near the head neck junction with mild prominence of the pancreatic duct, measuring up to 5 mm, with atrophy of the distal pancreatic parenchyma. 2. The portal vein, splenic vein, and superior mesenteric vein are now widely patent, previously effaced at the confluence by mass effect. 3. Interval placement of common bile duct stent, tip positioned in the distal duodenum, with relief of previously seen biliary ductal dilatation. 4. 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 Following the  bifurcation of the celiac mesenteric trunk, the celiac axis appears to directly traverse the vicinity of the mass, although there does appear to be a fat plane about the vessel. 5. The distal small bowel and colon are diffusely somewhat hyperenhancing and inflamed appearing with vascular combing and a tethered appearance of the distal small bowel. This appearance generally suggests inflammatory bowel disease such as Crohn's disease. Correlate with referable clinical history, if present. No evidence of obstruction or other acute complication. 6. Hepatic steatosis. 7. Aortic atherosclerosis.   12/28/2020 Genetic Testing   Negative hereditary cancer genetic testing: no pathogenic variants detected in [employer] Common Hereditary Cancers Panel.  The report date is December 28, 2020.    The Common Hereditary Cancers Panel offered by [employer] includes sequencing and/or deletion duplication testing of the following 47 genes: APC, ATM, AXIN2, BARD1, BMPR1A, BRCA1, BRCA2, BRIP1, CDH1, CDK4, CDKN2A (p14ARF), CDKN2A (p16INK4a), CHEK2, CTNNA1, DICER1, EPCAM (Deletion/duplication testing only), GREM1 (promoter region deletion/duplication testing only), GREM1, HOXB13, KIT, MEN1, MLH1, MSH2, MSH3, MSH6, MUTYH, NBN, NF1, NHTL1, PALB2, PDGFRA, PMS2, POLD1, POLE, PTEN, RAD50, RAD51C, RAD51D, SDHA, SDHB, SDHC, SDHD, SMAD4, SMARCA4. STK11, TP53, TSC1, TSC2, and VHL.  The following genes were evaluated for sequence changes only: SDHA and HOXB13 c.251G>A variant only.   01/17/2021 Imaging   CT CAP from [facility] IMPRESSION Small lesion in the pancreatic head measuring approximately 1.1 x 0.8 cm without evidence of vascular involvement or metastasis consistent with previously described, biopsy-proven pancreatic adenocarcinoma.   02/08/2021 Surgery   Whipple Surgery with Dr Abran Kallman  Final Pathologic Diagnosis      A.  GALLBLADDER, CHOLECYSTECTOMY: Chronic cholecystitis. No malignancy identified.   B.  BILE DUCT STENT,  REMOVAL (GROSS ONLY DIAGNOSIS): Stent, see gross description.   C.  WHIPPLE RESECTION: No residual malignancy identified. Chronic and acute inflammation with granulation tissue reaction, fibrosis and features of chronic pancreatitis, suggestive of therapy effect. Fourteen lymph nodes, negative for metastasis (0/14). Margins negative for malignancy.        06/07/2021 Imaging   CT AP  IMPRESSION: 1. No current findings of recurrent malignancy. Interval Whipple procedure with expected postoperative findings. 2. A 3.5 cm stent is present in the dorsal pancreatic duct. No duct dilatation. There is an approximately 1.5 mm lucency centrally along this stent shown on image 43 series 7, possibilities include stent fracture, two separate stents, or an intentional radial lucency in this type of stent. 3. Small type 1 hiatal hernia. Mild distal esophageal wall thickening may reflect low-grade esophagitis. 4.  Aortic Atherosclerosis (ICD10-I70.0). 5.  Prominent stool throughout the colon favors constipation. 6. Uterine fibroid. 7. 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   11/14/2023 - 01/25/2024 Chemotherapy   Patient is on Treatment Plan : PANCREAS NALIRIFOX D1, 15 Q28D     02/20/2024 - 02/20/2024 Chemotherapy   Patient is on Treatment Plan : PANCREATIC Abraxane D1,8,15 + Gemcitabine D1,8,15 q28d     08/21/2024 -  Chemotherapy   Patient is on Treatment Plan : PANCREATIC Abraxane D1,8,15 + Gemcitabine D1,8,15 q28d     Port-A-Cath in place     Discussed the use of AI scribe software for clinical note transcription with the patient, who gave verbal consent to proceed.  History of Present Illness Jacqueline Tyler is a 63 year old female with pancreatic cancer who presents for follow-up.  She feels great overall with no pain, cough, or breathing problems. Her weight is stable at 121  pounds, and she is eating well. She has returned to work with no  issues with energy levels.  She experiences daily bloating and sharp abdominal pains associated with gas, rating the pain as a ten on a scale of zero to ten, although it does not last long. She does not take any pain medication for these symptoms.  Recent imaging shows nodules in the lungs and new findings in the abdomen, including fluid accumulation and nodules outside the bowel. There is also a small lesion in the liver.  She works from home and has nausea medication and a cream for nausea management. No numbness, tingling, or other neurological symptoms.     All other systems were reviewed with the patient and are negative.  MEDICAL HISTORY:  Past Medical History:  Diagnosis Date   Anemia    Anxiety    Cancer (HCC) 07/2020   pancreatic cancer   Depression    Diabetes mellitus without complication (HCC) 07/2020   pancreatic cancer   High cholesterol    History of blood transfusion    History of hiatal hernia    small   Hypertension    Stroke [facility])    age 59, no residual effect    SURGICAL HISTORY: Past Surgical History:  Procedure Laterality Date   BILIARY BRUSHING  08/22/2020   Procedure: BILIARY BRUSHING;  Surgeon: Rosalie Kitchens, MD;  Location: [facility] ENDOSCOPY;  Service: Endoscopy;;   BILIARY STENT PLACEMENT N/A 08/22/2020   Procedure: BILIARY STENT PLACEMENT;  Surgeon: Rosalie Kitchens, MD;  Location: [facility];  Service: Endoscopy;  Laterality: N/A;   BIOPSY OF SKIN SUBCUTANEOUS TISSUE AND/OR MUCOUS MEMBRANE  02/27/2024   Procedure: BIOPSY, SKIN, SUBCUTANEOUS TISSUE, OR MUCOUS MEMBRANE;  Surgeon: Mansouraty, Aloha Raddle., MD;  Location: [facility];  Service: Gastroenterology;;   BRONCHIAL BIOPSY  10/28/2023   Procedure: BRONCHIAL BIOPSIES;  Surgeon: Shelah Lamar RAMAN, MD;  Location: [facility];  Service: Pulmonary;;   BRONCHIAL BRUSHINGS  10/28/2023   Procedure: BRONCHIAL BRUSHINGS;  Surgeon: Shelah Lamar RAMAN, MD;  Location: [facility] ENDOSCOPY;  Service: Pulmonary;;   BRONCHIAL NEEDLE  ASPIRATION BIOPSY  10/28/2023   Procedure: BRONCHIAL NEEDLE ASPIRATION BIOPSIES;  Surgeon: Shelah Lamar RAMAN, MD;  Location: [facility] ENDOSCOPY;  Service: Pulmonary;;   ENDOSCOPIC RETROGRADE CHOLANGIOPANCREATOGRAPHY (ERCP) WITH PROPOFOL  N/A 08/22/2020   Procedure: ENDOSCOPIC RETROGRADE CHOLANGIOPANCREATOGRAPHY (ERCP) WITH PROPOFOL ;  Surgeon: Rosalie Kitchens, MD;  Location: [facility];  Service: Endoscopy;  Laterality: N/A;   ESOPHAGOGASTRODUODENOSCOPY (EGD) WITH PROPOFOL  N/A 08/24/2020   Procedure: ESOPHAGOGASTRODUODENOSCOPY (EGD) WITH PROPOFOL ;  Surgeon: Burnette Fallow, MD;  Location: [facility];  Service: Endoscopy;  Laterality: N/A;   EUS N/A 02/27/2024   Procedure: ULTRASOUND, UPPER GI TRACT, ENDOSCOPIC;  Surgeon: Wilhelmenia Aloha Raddle., MD;  Location: [facility];  Service: Gastroenterology;  Laterality: N/A;   FIDUCIAL MARKER PLACEMENT  10/28/2023   Procedure: FIDUCIAL MARKER PLACEMENT;  Surgeon: Shelah Lamar RAMAN, MD;  Location: [facility] ENDOSCOPY;  Service: Pulmonary;;   FIDUCIAL MARKER PLACEMENT  02/27/2024   Procedure: INSERTION, FIDUCIAL MARKERS;  Surgeon: Wilhelmenia Aloha Raddle., MD;  Location: [facility];  Service: Gastroenterology;;   FINE NEEDLE ASPIRATION N/A 08/24/2020   Procedure: FINE NEEDLE ASPIRATION (FNA) LINEAR;  Surgeon: Burnette Fallow, MD;  Location: [facility];  Service: Endoscopy;  Laterality: N/A;   PORT-A-CATH REMOVAL N/A 12/08/2021   Procedure: REMOVAL PORT-A-CATH;  Surgeon: Dasie Leonor CROME, MD;  Location: [facility] OR;  Service: General;  Laterality: N/A;   PORTACATH PLACEMENT Right 09/08/2020   Procedure: INSERTION PORT-A-CATH;  Surgeon: Dasie Leonor  L, MD;  Location: [facility];  Service: General;  Laterality: Right;   PORTACATH PLACEMENT N/A 11/06/2023   Procedure: INSERTION PORT-A-CATH;  Surgeon: Dasie Leonor CROME, MD;  Location: [facility];  Service: General;  Laterality: N/A;  LMA   SPHINCTEROTOMY  08/22/2020   Procedure: ANNETT;  Surgeon: Rosalie Kitchens, MD;   Location: [facility];  Service: Endoscopy;;   UPPER ESOPHAGEAL ENDOSCOPIC ULTRASOUND (EUS) N/A 08/24/2020   Procedure: UPPER ESOPHAGEAL ENDOSCOPIC ULTRASOUND (EUS);  Surgeon: Burnette Fallow, MD;  Location: [facility];  Service: Endoscopy;  Laterality: N/A;    I have reviewed the social history and family history with the patient and they are unchanged from previous note.  ALLERGIES:  is allergic to irinotecan  liposome, tyloxapol, oxycodone -acetaminophen , and poison ivy extract.  MEDICATIONS:  Current Outpatient Medications  Medication Sig Dispense Refill   acetaminophen  (TYLENOL ) 325 MG tablet Take 650 mg by mouth every 6 (six) hours as needed for moderate pain (pain score 4-6).     busPIRone (BUSPAR) 5 MG tablet Take 5 mg by mouth daily.     citalopram  (CELEXA ) 40 MG tablet Take 40 mg by mouth every evening.     CREON  36000-114000 units CPEP capsule TAKE 2 CAPSULES BY MOUTH 3 TIMES A DAY WITH A MEAL. MAY ALSO TAKE 1 CAPSULE AS NEEDED WITH SNACKS 240 capsule 2   hydrochlorothiazide (MICROZIDE) 12.5 MG capsule Take 12.5 mg by mouth in the morning.     KLOR-CON  M20 20 MEQ tablet TAKE 1 TABLET BY MOUTH TWICE A DAY 180 tablet 1   magnesium  oxide (MAG-OX) 400 (241.3 Mg) MG tablet Take 1 tablet (400 mg total) by mouth every evening. (Patient not taking: Reported on 08/10/2024)     oxyCODONE -acetaminophen  (PERCOCET/ROXICET) 5-325 MG tablet Take 1 tablet by mouth every 6 (six) hours as needed for severe pain (pain score 7-10). (Patient not taking: Reported on 08/10/2024) 12 tablet 0   pantoprazole  (PROTONIX ) 40 MG tablet Take 1 tablet (40 mg total) by mouth 2 (two) times daily before a meal. Twice daily for 2 months then once daily thereafter. 60 tablet 11   potassium chloride  (KLOR-CON ) 10 MEQ tablet Take 1 tablet (10 mEq total) by mouth 2 (two) times daily. 60 tablet 1   simvastatin (ZOCOR) 80 MG tablet Take 80 mg by mouth every evening.     No current facility-administered medications for this  visit.    PHYSICAL EXAMINATION: ECOG PERFORMANCE STATUS: 1 - Symptomatic but completely ambulatory  Vitals:   08/10/24 0812  BP: 116/60  Pulse: (!) 56  Resp: 17  Temp: 98.4 F (36.9 C)  SpO2: 99%   Wt Readings from Last 3 Encounters:  08/10/24 121 lb 1.6 oz (54.9 kg)  05/28/24 120 lb 6.4 oz (54.6 kg)  04/21/24 119 lb 4.8 oz (54.1 kg)     GENERAL:alert, no distress and comfortable SKIN: skin color, texture, turgor are normal, no rashes or significant lesions EYES: normal, Conjunctiva are pink and non-injected, sclera clear Musculoskeletal:no cyanosis of digits and no clubbing  NEURO: alert & oriented x 3 with fluent speech, no focal motor/sensory deficits  Physical Exam   LABORATORY DATA:  I have reviewed the data as listed    Latest Ref Rng & Units 07/29/2024    7:33 AM 05/21/2024    7:37 AM 04/21/2024    9:42 AM  CBC  WBC 4.0 - 10.5 K/uL 7.3  6.3  4.6   Hemoglobin 12.0 - 15.0 g/dL 88.9  88.8  89.2  Hematocrit 36.0 - 46.0 % 33.8  34.9  32.2   Platelets 150 - 400 K/uL 317  256  244         Latest Ref Rng & Units 07/29/2024    7:33 AM 05/21/2024    7:37 AM 04/21/2024    9:42 AM  CMP  Glucose 70 - 99 mg/dL 88  88  855   BUN 8 - 23 mg/dL 10  17  15    Creatinine 0.44 - 1.00 mg/dL 9.08  8.96  9.10   Sodium 135 - 145 mmol/L 142  141  141   Potassium 3.5 - 5.1 mmol/L 3.4  3.5  3.8   Chloride 98 - 111 mmol/L 107  109  109   CO2 22 - 32 mmol/L 26  25  25    Calcium  8.9 - 10.3 mg/dL 9.0  8.9  9.0   Total Protein 6.5 - 8.1 g/dL 6.8  6.6  6.9   Total Bilirubin 0.0 - 1.2 mg/dL 1.2  0.8  0.8   Alkaline Phos 38 - 126 U/L 78  72  76   AST 15 - 41 U/L 18  16  17    ALT 0 - 44 U/L 14  14  12        RADIOGRAPHIC STUDIES: I have personally reviewed the radiological images as listed and agreed with the findings in the report. No results found.    Orders Placed This Encounter  Procedures   CBC with Differential (Cancer Center Only)    Standing Status:   Future    Expected  Date:   08/21/2024    Expiration Date:   08/21/2025   CMP (Cancer Center only)    Standing Status:   Future    Expected Date:   08/21/2024    Expiration Date:   08/21/2025   CBC with Differential (Cancer Center Only)    Standing Status:   Future    Expected Date:   09/04/2024    Expiration Date:   09/04/2025   CMP (Cancer Center only)    Standing Status:   Future    Expected Date:   09/04/2024    Expiration Date:   09/04/2025   CBC with Differential (Cancer Center Only)    Standing Status:   Future    Expected Date:   09/18/2024    Expiration Date:   09/18/2025   CMP (Cancer Center only)    Standing Status:   Future    Expected Date:   09/18/2024    Expiration Date:   09/18/2025   CBC with Differential (Cancer Center Only)    Standing Status:   Future    Expected Date:   10/02/2024    Expiration Date:   10/02/2025   CMP (Cancer Center only)    Standing Status:   Future    Expected Date:   10/02/2024    Expiration Date:   10/02/2025   All questions were answered. The patient knows to call the clinic with any problems, questions or concerns. No barriers to learning was detected. The total time spent in the appointment was 40 minutes, including review of chart and various tests results, discussions about plan of care and coordination of care plan     Onita Mattock, MD 08/10/2024

## 2024-08-10 NOTE — Progress Notes (Signed)
 ON PATHWAY REGIMEN - Pancreatic Adenocarcinoma  No Change  Continue With Treatment as Ordered.  Original Decision Date/Time: 02/06/2024 16:43     A cycle is every 28 days:     Nab-paclitaxel (protein bound)      Gemcitabine   **Always confirm dose/schedule in your pharmacy ordering system**  Patient Characteristics: Metastatic Disease, Second Line, MSS/pMMR or MSI Unknown, Fluoropyrimidine-Based Therapy First Line Therapeutic Status: Metastatic Disease Line of Therapy: Second Line Microsatellite/Mismatch Repair Status: MSS/pMMR Intent of Therapy: Non-Curative / Palliative Intent, Discussed with Patient

## 2024-08-11 ENCOUNTER — Other Ambulatory Visit: Payer: Self-pay

## 2024-08-13 NOTE — Progress Notes (Signed)
 Pharmacist Chemotherapy Monitoring - Initial Assessment    Anticipated start date: 08/21/24   The following has been reviewed per standard work regarding the patient's treatment regimen: The patient's diagnosis, treatment plan and drug doses, and organ/hematologic function Lab orders and baseline tests specific to treatment regimen  The treatment plan start date, drug sequencing, and pre-medications Prior authorization status  Patient's documented medication list, including drug-drug interaction screen and prescriptions for anti-emetics and supportive care specific to the treatment regimen The drug concentrations, fluid compatibility, administration routes, and timing of the medications to be used The patient's access for treatment and lifetime cumulative dose history, if applicable  The patient's medication allergies and previous infusion related reactions, if applicable   Changes made to treatment plan:  N/A  Follow up needed:  Pre-tx cycle completed and not released; f/u if pt has antiemetics leftover from previous tx  Katlyne Nishida, PharmD, MBA

## 2024-08-20 NOTE — Progress Notes (Unsigned)
 Patient Care Team: [name], DO as PCP - General (Internal Medicine) Lanny Callander, MD as Consulting Physician (Oncology) Dasie Leonor CROME, MD as Consulting Physician (General Surgery)  Clinic Day:  08/21/2024  Referring physician: Lanny Callander, MD  ASSESSMENT & PLAN:   Assessment & Plan: Pancreatic cancer [facility]) stage IIB, [name], [name], [name] -Diagnosed 08/24/20 on EUS with Dr Burnette, which showed mass at head of pancreas, s/p stenting, with SMV and PV invasion, biopsy confirmed adenocarcinoma with few malignant-appearing LNs in peripancreatic and porta hepatis region. Staging was negative for metastatic disease.  -she completed neoadjuvant chemo FOLFIRINOX q2 weeks 09/12/20 - 12/29/20. -s/p whipple surgery by Dr Debora on 02/08/21, path showed no residual disease.  -due to the high recurrence risk of pancreatic cancer, she is under close surveillance.  -Her PET scan from September 30, 2023 showed hypermetabolic soft tissue mass at the previous Whipple surgical site, and a small mildly hypermetabolic nodule in the left lung, concerning for metastatic recurrence. -lung biopsy 10/28/2023 confirmed adenocarcinoma, IHC consistent with metastatic pancreatic ca.  -she started chemo NALIRIFOX  on 11/14/23, had a significant diarrhea, required dose reduction. S/p 6 cycles  -restaging CT 02/03/2024 showed good partial response  -she started consolidation SBRT to lung and pancrease on 03/16/2024 and completed on 03/26/2024 -CT 05/21/2024 showed  treatment response in pancreas and lung nodule, but also showed scattered new left upper lobe pulmonary nodules measure up to 6 mm concerning for progressive pulmonary metastatic disease.  Her tumor marker CA 19.9 dropped down to normal previously and is back to 300's now, concerning for disease progression. She underwent bronchoscopy biopsy which confirmed metastatic recurrence  -she was asymptomatic and reluctant  to have chemo again, so we decided to wait for 2-3 months. -repeated CT 07/29/24 showed disease progression in lung and new peritoneal metastasis -It was recommended she restart chemotherapy with gemcitabine and Abraxane every 2 weeks. -08/21/2024 - Cycle 1 day 1 chemotherapy with gemcitabine and abraxane.    Mild anemia This is chronic and stable.  She  denies any evidence of bleeding.  She has no blood in her stool or hematemesis.  Will recommend OTC slow release iron supplement along with vitamin C to aid in absorption.  Will check with every visit prior to chemo.  Will treat iron deficiency as indicated.  Plan Labs reviewed. -Mild and stable anemia. - Unremarkable CMP. Labs and patient presentation are appropriate for treatment. Proceed with cycle 1 day 1 chemotherapy with gemcitabine and Abraxane. Labs/flush, follow-up, and cycle 1 day 15 chemotherapy with gemcitabine and Abraxane as scheduled.   the patient understands the plans discussed today and is in agreement with them.  She knows to contact our office if she develops concerns prior to her next appointment.  I provided 20 minutes of face-to-face time during this encounter and > 50% was spent counseling as documented under my assessment and plan.    Powell FORBES Lessen, NP  [facility] - A DEPT OF [facility] [address] [place] [address] Dept: [phone] Dept Fax: [phone]   No orders of the defined types were placed in this encounter.     CHIEF COMPLAINT:  CC: Malignant neoplasm of head of pancreas  Current Treatment: Chemotherapy gemcitabine and Abraxane every 2 weeks  INTERVAL HISTORY:  Jacqueline Tyler is here today for repeat clinical assessment.  She saw Dr. Lanny on 08/10/2024.  Today,  she presents for cycle 1 day 1 chemotherapy gemcitabine and Abraxane.  She had CT CAP done 07/29/2024 showed disease progression in lung and new peritoneal metastasis.   She reports no new problems or concerns.  She denies chest pain, chest pressure, or shortness of breath. She denies headaches or visual disturbances. She denies abdominal pain, nausea, vomiting, or changes in bowel or bladder habits. She denies fevers or chills. She denies pain. Her appetite is good. Her weight has been stable.  I have reviewed the past medical history, past surgical history, social history and family history with the patient and they are unchanged from previous note.  ALLERGIES:  is allergic to irinotecan  liposome, tyloxapol, oxycodone -acetaminophen , and poison ivy extract.  MEDICATIONS:  Current Outpatient Medications  Medication Sig Dispense Refill   acetaminophen  (TYLENOL ) 325 MG tablet Take 650 mg by mouth every 6 (six) hours as needed for moderate pain (pain score 4-6).     busPIRone (BUSPAR) 5 MG tablet Take 5 mg by mouth daily.     citalopram  (CELEXA ) 40 MG tablet Take 40 mg by mouth every evening.     CREON  36000-114000 units CPEP capsule TAKE 2 CAPSULES BY MOUTH 3 TIMES A DAY WITH A MEAL. MAY ALSO TAKE 1 CAPSULE AS NEEDED WITH SNACKS 240 capsule 2   hydrochlorothiazide (MICROZIDE) 12.5 MG capsule Take 12.5 mg by mouth in the morning.     KLOR-CON  M20 20 MEQ tablet TAKE 1 TABLET BY MOUTH TWICE A DAY 180 tablet 1   magnesium  oxide (MAG-OX) 400 (241.3 Mg) MG tablet Take 1 tablet (400 mg total) by mouth every evening.     ondansetron  (ZOFRAN ) 8 MG tablet Take 1 tablet (8 mg total) by mouth every 8 (eight) hours as needed for nausea or vomiting. 30 tablet 1   oxyCODONE -acetaminophen  (PERCOCET/ROXICET) 5-325 MG tablet Take 1 tablet by mouth every 6 (six) hours as needed for severe pain (pain score 7-10). 12 tablet 0   pantoprazole  (PROTONIX ) 40 MG tablet Take 1 tablet (40 mg total) by mouth 2 (two) times daily before a meal. Twice daily for 2 months then once daily thereafter. 60 tablet 11   potassium chloride  (KLOR-CON ) 10 MEQ tablet Take 1 tablet (10 mEq total) by mouth 2  (two) times daily. 60 tablet 1   prochlorperazine  (COMPAZINE ) 10 MG tablet Take 1 tablet (10 mg total) by mouth every 6 (six) hours as needed for nausea or vomiting. 30 tablet 1   simvastatin (ZOCOR) 80 MG tablet Take 80 mg by mouth every evening.     No current facility-administered medications for this visit.   Facility-Administered Medications Ordered in Other Visits  Medication Dose Route Frequency Provider Last Rate Last Admin   0.9 %  sodium chloride  infusion   Intravenous Continuous Lanny Callander, MD 10 mL/hr at 08/21/24 0940 New Bag at 08/21/24 0940   gemcitabine (GEMZAR) 1,216 mg in sodium chloride  0.9 % 250 mL chemo infusion  800 mg/m2 (Treatment Plan Recorded) Intravenous Once Lanny Callander, MD       PACLitaxel-protein bound (ABRAXANE) chemo infusion 150 mg  100 mg/m2 (Treatment Plan Recorded) Intravenous Once Lanny Callander, MD 60 mL/hr at 08/21/24 1028 150 mg at 08/21/24 1028    HISTORY OF PRESENT ILLNESS:   Oncology History Overview Note  Cancer Staging Pancreatic cancer [facility]) Staging form: Exocrine Pancreas, AJCC 8th Edition - Clinical stage from 08/24/2020: Stage IIB (cT2, cN1, cM0) - Signed by Lanny Callander, MD on 08/30/2020 Stage prefix: Initial diagnosis    Pancreatic  cancer (HCC)  08/20/2020 Imaging   CT AP 08/20/20  IMPRESSION: 1. 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 Collateral circulation is noted. There is moderate intrahepatic and extrahepatic biliary dilatation which appears to be due to the pancreatic head mass. Pancreatic ductal dilatation is noted as well. 2. Probable 2.7 cm uterine fibroid. 3. Aortic atherosclerosis.   Aortic Atherosclerosis (ICD10-I70.0).   08/20/2020 Tumor Marker   Ca19-9 - 927   08/22/2020 Procedure   ERCP by Dr Rosalie 08/22/20  IMPRESSION - The major papilla  appeared normal. - A biliary sphincterotomy was performed. - Cells for cytology obtained in the lower third and middle of the main duct. - One plastic stent was placed into the common bile duct.   FINAL MICROSCOPIC DIAGNOSIS:  - No malignant cells identified  - Benign reactive/reparative changes   08/24/2020 Cancer Staging   Staging form: Exocrine Pancreas, AJCC 8th Edition - Clinical stage from 08/24/2020: Stage IIB (cT2, cN1, cM0) - Signed by Lanny Callander, MD on 08/30/2020   08/24/2020 Procedure   EUS by Dr burnette 08/24/20  IMPRESSION - There was no sign of significant pathology in the ampulla. - A few malignant-appearing lymph nodes were visualized in the peripancreatic region and porta hepatis region. - One stent was visualized endosonographically in the common bile duct. - A mass was identified in the pancreatic head. Tissue was obtained from this exam. The preliminary diagnosis is consistent with adenocarcinoma. Invasion into SMV/PV seen. Lymphadenopathy noted. This was staged T3 N1 Mx by endosonographic criteria. Fine needle aspiration performed.   08/24/2020 Initial Biopsy   FINAL MICROSCOPIC DIAGNOSIS: 08/24/20 - Malignant cells consistent with adenocarcinoma    08/30/2020 Initial Diagnosis   Pancreatic cancer (HCC)   09/05/2020 Imaging   CT Chest  IMPRESSION: 1. Stable 2.7 cm infiltrating pancreatic head mass with borderline enlarged peripancreatic lymph nodes. 2. No findings for pulmonary metastatic disease. 3. Small hiatal hernia. 4. Aortic atherosclerosis.   Aortic Atherosclerosis (ICD10-I70.0).   09/08/2020 Procedure   INSERTION PORT-A-CATH by Dr Dasie and Aron    09/12/2020 - 12/29/2020 Chemotherapy   Neoadjuvant FOLFIRINOX q2 weeks starting 09/12/20-12/29/20    12/02/2020 Imaging   CT CAP  IMPRESSION: 1. Previously noted mass of the central pancreatic head is almost entirely resolved, difficult to discretely appreciate on current examination. Findings are  consistent with treatment response. There remains obstruction of the pancreatic duct near the head neck junction with mild prominence of the pancreatic duct, measuring up to 5 mm, with atrophy of the distal pancreatic parenchyma. 2. The portal vein, splenic vein, and superior mesenteric vein are now widely patent, previously effaced at the confluence by mass effect. 3. Interval placement of common bile duct stent, tip positioned in the distal duodenum, with relief of previously seen biliary ductal dilatation. 4. 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 Following the bifurcation of the celiac mesenteric trunk, the celiac axis appears to directly traverse the vicinity of the mass, although there does appear to be a fat plane about the vessel. 5. The distal small bowel and colon are diffusely somewhat hyperenhancing and inflamed appearing with vascular combing and a tethered appearance of the distal small bowel.  This appearance generally suggests inflammatory bowel disease such as Crohn's disease. Correlate with referable clinical history, if present. No evidence of obstruction or other acute complication. 6. Hepatic steatosis. 7. Aortic atherosclerosis.   12/28/2020 Genetic Testing   Negative hereditary cancer genetic testing: no pathogenic variants detected in [employer] Common Hereditary Cancers Panel.  The report date is December 28, 2020.    The Common Hereditary Cancers Panel offered by [facility] includes sequencing and/or deletion duplication testing of the following 47 genes: APC, ATM, AXIN2, BARD1, BMPR1A, BRCA1, BRCA2, BRIP1, CDH1, CDK4, CDKN2A (p14ARF), CDKN2A (p16INK4a), CHEK2, CTNNA1, DICER1, EPCAM (Deletion/duplication testing only), GREM1 (promoter region deletion/duplication testing only), GREM1, HOXB13, KIT, MEN1, MLH1,  MSH2, MSH3, MSH6, MUTYH, NBN, NF1, NHTL1, PALB2, PDGFRA, PMS2, POLD1, POLE, PTEN, RAD50, RAD51C, RAD51D, SDHA, SDHB, SDHC, SDHD, SMAD4, SMARCA4. STK11, TP53, TSC1, TSC2, and VHL.  The following genes were evaluated for sequence changes only: SDHA and HOXB13 c.251G>A variant only.   01/17/2021 Imaging   CT CAP from [facility] IMPRESSION Small lesion in the pancreatic head measuring approximately 1.1 x 0.8 cm without evidence of vascular involvement or metastasis consistent with previously described, biopsy-proven pancreatic adenocarcinoma.   02/08/2021 Surgery   Whipple Surgery with Dr Abran Kallman  Final Pathologic Diagnosis      A.  GALLBLADDER, CHOLECYSTECTOMY: Chronic cholecystitis. No malignancy identified.   B.  BILE DUCT STENT, REMOVAL (GROSS ONLY DIAGNOSIS): Stent, see gross description.   C.  WHIPPLE RESECTION: No residual malignancy identified. Chronic and acute inflammation with granulation tissue reaction, fibrosis and features of chronic pancreatitis, suggestive of therapy effect. Fourteen lymph nodes, negative for metastasis (0/14). Margins negative for malignancy.        06/07/2021 Imaging   CT AP  IMPRESSION: 1. No current findings of recurrent malignancy. Interval Whipple procedure with expected postoperative findings. 2. A 3.5 cm stent is present in the dorsal pancreatic duct. No duct dilatation. There is an approximately 1.5 mm lucency centrally along this stent shown on image 43 series 7, possibilities include stent fracture, two separate stents, or an intentional radial lucency in this type of stent. 3. Small type 1 hiatal hernia. Mild distal esophageal wall thickening may reflect low-grade esophagitis. 4.  Aortic Atherosclerosis (ICD10-I70.0). 5.  Prominent stool throughout the colon favors constipation. 6. Uterine fibroid. 7. 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   11/14/2023 - 01/25/2024 Chemotherapy   Patient is  on Treatment Plan : PANCREAS NALIRIFOX D1, 15 Q28D     02/20/2024 - 02/20/2024 Chemotherapy   Patient is on Treatment Plan : PANCREATIC Abraxane D1,8,15 + Gemcitabine D1,8,15 q28d     08/21/2024 -  Chemotherapy   Patient is on Treatment Plan : PANCREATIC Abraxane D1,8,15 + Gemcitabine D1,8,15 q28d     Port-A-Cath in place      REVIEW OF SYSTEMS:   Constitutional: Denies fevers, chills or abnormal weight loss Eyes: Denies blurriness of vision Ears, nose, mouth, throat, and face: Denies mucositis or sore throat Respiratory: Denies cough, dyspnea or wheezes Cardiovascular: Denies palpitation, chest discomfort or lower extremity swelling Gastrointestinal:  Denies nausea, heartburn or change in bowel habits Skin: Denies abnormal skin rashes Lymphatics: Denies new lymphadenopathy or easy bruising Neurological:Denies numbness, tingling or new weaknesses Behavioral/Psych: Mood is stable, no new changes  All other systems were reviewed with the patient and are negative.   VITALS:   Today's Vitals   08/21/24 0834 08/21/24 0844  BP: 120/78   Pulse: (!) 52   Resp: 17  Temp: 98 F (36.7 C)   SpO2: 99%   Weight: 122 lb 11.2 oz (55.7 kg)   Height: 5' 1 (1.549 m)   PainSc: 0-No pain 0-No pain   Body mass index is 23.18 kg/m.   Wt Readings from Last 3 Encounters:  08/21/24 122 lb 11.2 oz (55.7 kg)  08/10/24 121 lb 1.6 oz (54.9 kg)  05/28/24 120 lb 6.4 oz (54.6 kg)    Body mass index is 23.18 kg/m.  Performance status (ECOG): 1 - Symptomatic but completely ambulatory  PHYSICAL EXAM:   GENERAL:alert, no distress and comfortable SKIN: skin color, texture, turgor are normal, no rashes or significant lesions EYES: normal, Conjunctiva are pink and non-injected, sclera clear OROPHARYNX:no exudate, no erythema and lips, buccal mucosa, and tongue normal  NECK: supple, thyroid normal size, non-tender, without nodularity LYMPH:  no palpable lymphadenopathy in the cervical, axillary or  inguinal LUNGS: clear to auscultation and percussion with normal breathing effort HEART: regular rate & rhythm and no murmurs and no lower extremity edema ABDOMEN:abdomen soft, non-tender and normal bowel sounds Musculoskeletal:no cyanosis of digits and no clubbing  NEURO: alert & oriented x 3 with fluent speech, no focal motor/sensory deficits  LABORATORY DATA:  I have reviewed the data as listed    Component Value Date/Time   NA 142 08/21/2024 0753   K 3.8 08/21/2024 0753   CL 112 (H) 08/21/2024 0753   CO2 24 08/21/2024 0753   GLUCOSE 86 08/21/2024 0753   BUN 14 08/21/2024 0753   CREATININE 1.06 (H) 08/21/2024 0753   CALCIUM  8.9 08/21/2024 0753   PROT 6.6 08/21/2024 0753   ALBUMIN 3.7 08/21/2024 0753   AST 13 (L) 08/21/2024 0753   ALT 9 08/21/2024 0753   ALKPHOS 74 08/21/2024 0753   BILITOT 0.7 08/21/2024 0753   GFRNONAA 59 (L) 08/21/2024 0753   GFRAA >60 08/23/2020 0606    Lab Results  Component Value Date   WBC 6.6 08/21/2024   NEUTROABS 4.8 08/21/2024   HGB 10.3 (L) 08/21/2024   HCT 31.7 (L) 08/21/2024   MCV 87.3 08/21/2024   PLT 325 08/21/2024     RADIOGRAPHIC STUDIES: CT CHEST ABDOMEN PELVIS W CONTRAST Result Date: 08/05/2024 CLINICAL DATA:  Pancreatic cancer, monitor.  * Tracking Code: BO * EXAM: CT CHEST, ABDOMEN, AND PELVIS WITH CONTRAST TECHNIQUE: Multidetector CT imaging of the chest, abdomen and pelvis was performed following the standard protocol during bolus administration of intravenous contrast. RADIATION DOSE REDUCTION: This exam was performed according to the departmental dose-optimization program which includes automated exposure control, adjustment of the mA and/or kV according to patient size and/or use of iterative reconstruction technique. CONTRAST:  OMNIPAQUE  IOHEXOL  300 MG/ML  SOLN COMPARISON:  Previous CT 05/21/2024 and 02/04/2024. PET-CT 09/30/2023. FINDINGS: CT CHEST FINDINGS Cardiovascular: Access left subclavian Port-A-Cath extends to the  superior cavoatrial junction. No acute vascular findings. The heart size is normal. There is no pericardial effusion. Mediastinum/Nodes: There are no enlarged mediastinal, hilar or axillary lymph nodes. Unchanged small hiatal hernia and mild distal esophageal wall thickening. The thyroid gland and trachea appear unremarkable. Lungs/Pleura: No pleural effusion or pneumothorax. Unchanged fiducial marker in the left upper lobe with increased surrounding nodularity. Posterolateral to the marker, there is a new solid-appearing nodule measuring 10 x 7 mm on image 48/5. Increased nodularity is also present more anteriorly, measuring up to 7 x 5 mm on image 45/5. There is an additional new 3 mm solid left upper lobe nodule on image 40/5. Anteriorly in  the right upper lobe, there is a suspected 4 mm cavitary lesion on image 27/5, slightly more conspicuous than on previous study. No other new or enlarging nodules are identified. Stable calcified left lower lobe granuloma. Musculoskeletal/Chest wall: No chest wall mass or suspicious osseous findings. CT ABDOMEN AND PELVIS FINDINGS Hepatobiliary: The liver is normal in density without highly suspicious focal abnormality. There is a well-circumscribed low-density lesion in the right hepatic lobe measuring 8 mm on image 71/2, without significant appreciable delayed enhancement. Although not seen previously, this appears cystic. Given findings below, this could reflect a small subcapsular implant. No significant biliary dilatation status post cholecystectomy. Pancreas: Stable postsurgical changes following Whipple procedure. No residual pancreatic mass identified. Pancreatic stent remains in place with diffuse atrophy of the pancreatic body and tail. Spleen: Normal in size without focal abnormality. Adrenals/Urinary Tract: Both adrenal glands appear normal. No evidence of urinary tract calculus, suspicious renal lesion or hydronephrosis. The bladder appears normal for its degree  of distention. Stomach/Bowel: No enteric contrast administered. Postsurgical changes from previous Whipple procedure. Mild distal gastric wall thickening, likely treatment related. Incomplete colonic distension without definite bowel wall thickening or surrounding inflammation. Vascular/Lymphatic: There are no enlarged abdominal or pelvic lymph nodes. Previously demonstrated small lymph nodes in the upper abdomen appear unchanged. Aortic and branch vessel atherosclerosis without evidence of aneurysm or large vessel occlusion. Reproductive: Hypoattenuating posterior right uterine lesion measuring 3.1 x 2.1 cm on image 107/2 is unchanged, likely a fibroid. No suspicious adnexal findings. Other: New small volume of ascites with new peritoneal nodularity, most prominent within the omental fat in the left upper quadrant and within the right pericolic gutter, concerning for peritoneal carcinomatosis. Musculoskeletal: Stable chronic compression deformities at L1 and L4. No acute osseous findings or evidence of osseous metastatic disease. IMPRESSION: 1. New small volume of ascites with new peritoneal nodularity, most prominent within the omental fat in the left upper quadrant and within the right pericolic gutter, concerning for peritoneal carcinomatosis. 2. New and enlarging upper lobe pulmonary nodules bilaterally, suspicious for progressive metastatic disease. 3. Stable postsurgical changes following Whipple procedure. No residual pancreatic mass identified. 4. New small low-density lesion in the right hepatic lobe, possibly a small subcapsular implant. 5. Stable chronic compression deformities at L1 and L4. 6.  Aortic Atherosclerosis (ICD10-I70.0). Electronically Signed   By: Elsie Perone M.D.   On: 08/05/2024 13:10

## 2024-08-20 NOTE — Assessment & Plan Note (Signed)
 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 Staging was negative for metastatic disease.  -she completed neoadjuvant chemo FOLFIRINOX q2 weeks 09/12/20 - 12/29/20. -s/p whipple surgery by Dr Debora on 02/08/21, path showed no residual disease.  -due to the high recurrence risk of pancreatic cancer, she is under close surveillance.  -Her PET scan from September 30, 2023 showed hypermetabolic soft tissue mass at the previous Whipple surgical site, and a small mildly hypermetabolic nodule in the left lung, concerning for metastatic recurrence. -lung biopsy 10/28/2023 confirmed adenocarcinoma, IHC consistent with metastatic pancreatic ca.  -she started chemo NALIRIFOX  on 11/14/23, had a significant diarrhea, required dose reduction. S/p 6 cycles  -restaging CT 02/03/2024 showed good partial response  -she started consolidation SBRT to lung and pancrease on 03/16/2024 and completed on 03/26/2024 -CT 05/21/2024 showed  treatment response in pancreas and lung nodule, but also showed scattered new left upper lobe pulmonary nodules measure up to 6 mm concerning for progressive pulmonary metastatic disease.  Her tumor marker CA 19.9 dropped down to normal previously and is back to 300's now, concerning for disease progression. She underwent bronchoscopy biopsy which confirmed metastatic recurrence  -she was asymptomatic and reluctant to have chemo again, so we decided to wait for 2-3 months. -repeated CT 07/29/24 showed disease progression in lung and new peritoneal metastasis -It was recommended she restart chemotherapy with gemcitabine and Abraxane every 2 weeks. -08/21/2024 - Cycle 1 day 1 chemotherapy with gemcitabine and abraxane.

## 2024-08-21 ENCOUNTER — Inpatient Hospital Stay

## 2024-08-21 ENCOUNTER — Inpatient Hospital Stay (HOSPITAL_BASED_OUTPATIENT_CLINIC_OR_DEPARTMENT_OTHER): Admitting: Nurse Practitioner

## 2024-08-21 ENCOUNTER — Encounter: Payer: Self-pay | Admitting: Nurse Practitioner

## 2024-08-21 ENCOUNTER — Inpatient Hospital Stay: Attending: Hematology

## 2024-08-21 VITALS — BP 120/78 | HR 52 | Temp 98.0°F | Resp 17 | Ht 61.0 in | Wt 122.7 lb

## 2024-08-21 DIAGNOSIS — C250 Malignant neoplasm of head of pancreas: Secondary | ICD-10-CM

## 2024-08-21 DIAGNOSIS — Z79899 Other long term (current) drug therapy: Secondary | ICD-10-CM | POA: Insufficient documentation

## 2024-08-21 DIAGNOSIS — D45 Polycythemia vera: Secondary | ICD-10-CM | POA: Insufficient documentation

## 2024-08-21 DIAGNOSIS — C7800 Secondary malignant neoplasm of unspecified lung: Secondary | ICD-10-CM | POA: Insufficient documentation

## 2024-08-21 DIAGNOSIS — Z5111 Encounter for antineoplastic chemotherapy: Secondary | ICD-10-CM | POA: Insufficient documentation

## 2024-08-21 LAB — CMP (CANCER CENTER ONLY)
ALT: 9 U/L (ref 0–44)
AST: 13 U/L — ABNORMAL LOW (ref 15–41)
Albumin: 3.7 g/dL (ref 3.5–5.0)
Alkaline Phosphatase: 74 U/L (ref 38–126)
Anion gap: 6 (ref 5–15)
BUN: 14 mg/dL (ref 8–23)
CO2: 24 mmol/L (ref 22–32)
Calcium: 8.9 mg/dL (ref 8.9–10.3)
Chloride: 112 mmol/L — ABNORMAL HIGH (ref 98–111)
Creatinine: 1.06 mg/dL — ABNORMAL HIGH (ref 0.44–1.00)
GFR, Estimated: 59 mL/min — ABNORMAL LOW (ref >60)
Glucose, Bld: 86 mg/dL (ref 70–99)
Potassium: 3.8 mmol/L (ref 3.5–5.1)
Sodium: 142 mmol/L (ref 135–145)
Total Bilirubin: 0.7 mg/dL (ref 0.0–1.2)
Total Protein: 6.6 g/dL (ref 6.5–8.1)

## 2024-08-21 LAB — CBC WITH DIFFERENTIAL (CANCER CENTER ONLY)
Abs Immature Granulocytes: 0.02 K/uL (ref 0.00–0.07)
Basophils Absolute: 0 K/uL (ref 0.0–0.1)
Basophils Relative: 1 %
Eosinophils Absolute: 0.2 K/uL (ref 0.0–0.5)
Eosinophils Relative: 2 %
HCT: 31.7 % — ABNORMAL LOW (ref 36.0–46.0)
Hemoglobin: 10.3 g/dL — ABNORMAL LOW (ref 12.0–15.0)
Immature Granulocytes: 0 %
Lymphocytes Relative: 14 %
Lymphs Abs: 0.9 K/uL (ref 0.7–4.0)
MCH: 28.4 pg (ref 26.0–34.0)
MCHC: 32.5 g/dL (ref 30.0–36.0)
MCV: 87.3 fL (ref 80.0–100.0)
Monocytes Absolute: 0.6 K/uL (ref 0.1–1.0)
Monocytes Relative: 10 %
Neutro Abs: 4.8 K/uL (ref 1.7–7.7)
Neutrophils Relative %: 73 %
Platelet Count: 325 K/uL (ref 150–400)
RBC: 3.63 MIL/uL — ABNORMAL LOW (ref 3.87–5.11)
RDW: 13.8 % (ref 11.5–15.5)
WBC Count: 6.6 K/uL (ref 4.0–10.5)
nRBC: 0 % (ref 0.0–0.2)

## 2024-08-21 MED ORDER — SODIUM CHLORIDE 0.9 % IV SOLN: INTRAVENOUS | Status: DC

## 2024-08-21 MED ORDER — PACLITAXEL PROTEIN-BOUND CHEMO INJECTION 100 MG
100.0000 mg/m2 | Freq: Once | INTRAVENOUS | Status: AC
  Administered 2024-08-21: 150 mg via INTRAVENOUS
  Filled 2024-08-21: qty 30

## 2024-08-21 MED ORDER — PROCHLORPERAZINE MALEATE 10 MG PO TABS: 10.0000 mg | ORAL_TABLET | Freq: Four times a day (QID) | ORAL | 1 refills | Status: AC | PRN

## 2024-08-21 MED ORDER — PROCHLORPERAZINE MALEATE 10 MG PO TABS
10.0000 mg | ORAL_TABLET | Freq: Once | ORAL | Status: AC
  Administered 2024-08-21: 10 mg via ORAL
  Filled 2024-08-21: qty 1

## 2024-08-21 MED ORDER — SODIUM CHLORIDE 0.9 % IV SOLN
800.0000 mg/m2 | Freq: Once | INTRAVENOUS | Status: AC
  Administered 2024-08-21: 1216 mg via INTRAVENOUS
  Filled 2024-08-21: qty 31.98

## 2024-08-21 MED ORDER — ONDANSETRON HCL 8 MG PO TABS: 8.0000 mg | ORAL_TABLET | Freq: Three times a day (TID) | ORAL | 1 refills | Status: AC | PRN

## 2024-08-21 NOTE — Patient Instructions (Signed)
 [facility] - A DEPT OF [facility]  Discharge Instructions: Thank you for choosing [facility] to provide your oncology and hematology care.   If you have a lab appointment with the Cancer Center, please go directly to the Cancer Center and check in at the registration area.   Wear comfortable clothing and clothing appropriate for easy access to any Portacath or PICC line.   We strive to give you quality time with your provider. You may need to reschedule your appointment if you arrive late (15 or more minutes).  Arriving late affects you and other patients whose appointments are after yours.  Also, if you miss three or more appointments without notifying the office, you may be dismissed from the clinic at the provider's discretion.      For prescription refill requests, have your pharmacy contact our office and allow 72 hours for refills to be completed.    Today you received the following chemotherapy and/or immunotherapy agents PACLitaxel -protein bound (ABRAXANE ), gemcitabine  (GEMZAR )       To help prevent nausea and vomiting after your treatment, we encourage you to take your nausea medication as directed.  BELOW ARE SYMPTOMS THAT SHOULD BE REPORTED IMMEDIATELY: *FEVER GREATER THAN 100.4 F (38 C) OR HIGHER *CHILLS OR SWEATING *NAUSEA AND VOMITING THAT IS NOT CONTROLLED WITH YOUR NAUSEA MEDICATION *UNUSUAL SHORTNESS OF BREATH *UNUSUAL BRUISING OR BLEEDING *URINARY PROBLEMS (pain or burning when urinating, or frequent urination) *BOWEL PROBLEMS (unusual diarrhea, constipation, pain near the anus) TENDERNESS IN MOUTH AND THROAT WITH OR WITHOUT PRESENCE OF ULCERS (sore throat, sores in mouth, or a toothache) UNUSUAL RASH, SWELLING OR PAIN  UNUSUAL VAGINAL DISCHARGE OR ITCHING   Items with * indicate a potential emergency and should be followed up as soon as possible or go to the Emergency Department if any problems should occur.  Please  show the CHEMOTHERAPY ALERT CARD or IMMUNOTHERAPY ALERT CARD at check-in to the Emergency Department and triage nurse.  Should you have questions after your visit or need to cancel or reschedule your appointment, please contact [facility]  Dept: [phone]  and follow the prompts.  Office hours are 8:00 a.m. to 4:30 p.m. Monday - Friday. Please note that voicemails left after 4:00 p.m. may not be returned until the following business day.  We are closed weekends and major holidays. You have access to a nurse at all times for urgent questions. Please call the main number to the clinic Dept: [phone] and follow the prompts.   For any non-urgent questions, you may also contact your provider using MyChart. We now offer e-Visits for anyone 59 and older to request care online for non-urgent symptoms. For details visit [email or web address].   Also download the MyChart app! Go to the app store, search MyChart, open the app, select [facility], and log in with your MyChart username and password.

## 2024-08-22 LAB — CANCER ANTIGEN 19-9: CA 19-9: 7290 U/mL — ABNORMAL HIGH (ref 0–35)

## 2024-08-30 ENCOUNTER — Other Ambulatory Visit: Payer: Self-pay

## 2024-09-03 NOTE — Assessment & Plan Note (Addendum)
 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 Staging was negative for metastatic disease.  -she completed neoadjuvant chemo FOLFIRINOX q2 weeks 09/12/20 - 12/29/20. -s/p whipple surgery by Dr Debora on 02/08/21, path showed no residual disease.  -due to the high recurrence risk of pancreatic cancer, she is under close surveillance.  -Her PET scan from September 30, 2023 showed hypermetabolic soft tissue mass at the previous Whipple surgical site, and a small mildly hypermetabolic nodule in the left lung, concerning for metastatic recurrence. -lung biopsy 10/28/2023 confirmed adenocarcinoma, IHC consistent with metastatic pancreatic ca.  -she started chemo NALIRIFOX  on 11/14/23, had a significant diarrhea, required dose reduction. S/p 6 cycles  -restaging CT 02/03/2024 showed good partial response  -she started consolidation SBRT to lung and pancrease on 03/16/2024 and completed on 03/26/2024 -CT 05/21/2024 showed  treatment response in pancreas and lung nodule, but also showed scattered new left upper lobe pulmonary nodules measure up to 6 mm concerning for progressive pulmonary metastatic disease.  Her tumor marker CA 19.9 dropped down to normal previously and is back to 300's now, concerning for disease progression. She underwent bronchoscopy biopsy which confirmed metastatic recurrence  -she was asymptomatic and reluctant to have chemo again, so we decided to wait for 2-3 months. -repeated CT 07/29/24 showed disease progression in lung and new peritoneal metastasis -It was recommended she restart chemotherapy with gemcitabine and Abraxane every 2 weeks. -08/21/2024 - Cycle 1 day 1 chemotherapy with gemcitabine and abraxane.  - 09/04/2024  -cycle 1 day 15 chemotherapy with gemcitabine and Abraxane.  She tolerated initial treatment very well.  Had some fatigue and has nearly returned to baseline.  Plan for labs/flush, follow-up, and cycle 2 day 1 chemotherapy gemcitabine and Abraxane in 2 weeks.

## 2024-09-03 NOTE — Progress Notes (Signed)
 Patient Care Team: [name], DO as PCP - General (Internal Medicine) Lanny Callander, MD as Consulting Physician (Oncology) Dasie Leonor CROME, MD as Consulting Physician (General Surgery)  Clinic Day:  09/04/2024  Referring physician: Lanny Callander, MD  ASSESSMENT & PLAN:   Assessment & Plan: Pancreatic cancer [facility]) stage IIB, [name], [name], [name] -Diagnosed 08/24/20 on EUS with Dr Burnette, which showed mass at head of pancreas, s/p stenting, with SMV and PV invasion, biopsy confirmed adenocarcinoma with few malignant-appearing LNs in peripancreatic and porta hepatis region. Staging was negative for metastatic disease.  -she completed neoadjuvant chemo FOLFIRINOX q2 weeks 09/12/20 - 12/29/20. -s/p whipple surgery by Dr Debora on 02/08/21, path showed no residual disease.  -due to the high recurrence risk of pancreatic cancer, she is under close surveillance.  -Her PET scan from September 30, 2023 showed hypermetabolic soft tissue mass at the previous Whipple surgical site, and a small mildly hypermetabolic nodule in the left lung, concerning for metastatic recurrence. -lung biopsy 10/28/2023 confirmed adenocarcinoma, IHC consistent with metastatic pancreatic ca.  -she started chemo NALIRIFOX  on 11/14/23, had a significant diarrhea, required dose reduction. S/p 6 cycles  -restaging CT 02/03/2024 showed good partial response  -she started consolidation SBRT to lung and pancrease on 03/16/2024 and completed on 03/26/2024 -CT 05/21/2024 showed  treatment response in pancreas and lung nodule, but also showed scattered new left upper lobe pulmonary nodules measure up to 6 mm concerning for progressive pulmonary metastatic disease.  Her tumor marker CA 19.9 dropped down to normal previously and is back to 300's now, concerning for disease progression. She underwent bronchoscopy biopsy which confirmed metastatic recurrence  -she was asymptomatic and reluctant  to have chemo again, so we decided to wait for 2-3 months. -repeated CT 07/29/24 showed disease progression in lung and new peritoneal metastasis -It was recommended she restart chemotherapy with gemcitabine and Abraxane every 2 weeks. -08/21/2024 - Cycle 1 day 1 chemotherapy with gemcitabine and abraxane.  - 09/04/2024 -cycle 1 day 15 chemotherapy with gemcitabine and Abraxane.  She tolerated initial treatment very well.  Had some fatigue and has nearly returned to baseline.  Plan for labs/flush, follow-up, and cycle 2 day 1 chemotherapy gemcitabine and Abraxane in 2 weeks.   Anemia Iron deficiency anemia, slightly worse from cycle 1 day 1 chemotherapy.  Today, Hgb is 9.4, down from 10.3.  HCT is 29.6, down from 31.7.  WBC, ANC, and platelet count remain stable.  Plan to add ferritin and iron studies to next labs and treat iron deficiency as indicated.  Fatigue Side effect from chemotherapy.  Patient states she slept for first several days after initial treatment with chemotherapy gemcitabine and Abraxane.  States this gradually improved.  Energy levels are nearly back to baseline.  Loose stools Combination of pancreatic insufficiency and chemotherapy.  Reports loose stools are much worse in the morning.  Generally has to take Imodium if she needs to leave the house early in the morning.  Imodium continues to be effective this far.  Plan Labs reviewed. - Slightly worsened anemia with Hgb 9.4 and HCT 29.6. --Add ferritin and iron studies to next round of labs. - Unremarkable CMP. Patient labs and presentation are appropriate for treatment today. Proceed with cycle 1 day 15 chemotherapy gemcitabine and Abraxane. Labs/flush, follow-up, and cycle 2 day 1 chemotherapy in 2 weeks.   The patient understands the plans discussed today and is in agreement with them.  She knows to  contact our office if she develops concerns prior to her next appointment.  I provided 25 minutes of face-to-face time  during this encounter and > 50% was spent counseling as documented under my assessment and plan.    Powell FORBES Lessen, NP  [facility] - A DEPT OF [facility] [address] [place] [address] Dept: [phone] Dept Fax: [phone]   No orders of the defined types were placed in this encounter.     CHIEF COMPLAINT:  CC: Pancreatic cancer  Current Treatment: Chemotherapy gemcitabine and Abraxane every 2 weeks  INTERVAL HISTORY:  Jacqueline Tyler is here today for repeat clinical assessment.  She last saw me on 08/21/2024.  Today, she presents for cycle 1 day 15 chemotherapy gemcitabine and Abraxane.  States that she feels good overall.  Did experience some fatigue right after chemotherapy.  She did sleep most of the first few days after chemotherapy.  This is gradually improved.  She is nearly back to baseline.  She denies neuropathies in her fingers or toes.  She denies chest pain, chest pressure, or shortness of breath. She denies headaches or visual disturbances. She denies abdominal pain, nausea, vomiting, or changes in bowel or bladder habits.  She does have several loose bowel movements every day.  This is more significant in the mornings and improves as the day goes on.  She has to take Imodium most days, especially if he needs to leave the house.  She denies fevers or chills. She denies pain. Her appetite is good. Her weight has increased 3 pounds over last 2 weeks.  I have reviewed the past medical history, past surgical history, social history and family history with the patient and they are unchanged from previous note.  ALLERGIES:  is allergic to irinotecan  liposome, tyloxapol, oxycodone -acetaminophen , and poison ivy extract.  MEDICATIONS:  Current Outpatient Medications  Medication Sig Dispense Refill   acetaminophen  (TYLENOL ) 325 MG tablet Take 650 mg by mouth every 6 (six) hours as needed for moderate pain (pain  score 4-6).     busPIRone (BUSPAR) 5 MG tablet Take 5 mg by mouth daily.     citalopram  (CELEXA ) 40 MG tablet Take 40 mg by mouth every evening.     CREON  36000-114000 units CPEP capsule TAKE 2 CAPSULES BY MOUTH 3 TIMES A DAY WITH A MEAL. MAY ALSO TAKE 1 CAPSULE AS NEEDED WITH SNACKS 240 capsule 2   KLOR-CON  M20 20 MEQ tablet TAKE 1 TABLET BY MOUTH TWICE A DAY 180 tablet 1   magnesium  oxide (MAG-OX) 400 (241.3 Mg) MG tablet Take 1 tablet (400 mg total) by mouth every evening.     ondansetron  (ZOFRAN ) 8 MG tablet Take 1 tablet (8 mg total) by mouth every 8 (eight) hours as needed for nausea or vomiting. 30 tablet 1   pantoprazole  (PROTONIX ) 40 MG tablet Take 1 tablet (40 mg total) by mouth 2 (two) times daily before a meal. Twice daily for 2 months then once daily thereafter. 60 tablet 11   potassium chloride  (KLOR-CON ) 10 MEQ tablet Take 1 tablet (10 mEq total) by mouth 2 (two) times daily. 60 tablet 1   prochlorperazine  (COMPAZINE ) 10 MG tablet Take 1 tablet (10 mg total) by mouth every 6 (six) hours as needed for nausea or vomiting. 30 tablet 1   simvastatin (ZOCOR) 80 MG tablet Take 80 mg by mouth every evening.     hydrochlorothiazide (HYDRODIURIL) 12.5 MG tablet Take  1/2 tablet po every day 30 tablet 0   oxyCODONE  (OXY IR/ROXICODONE ) 5 MG immediate release tablet Take 1 tablet (5 mg total) by mouth every 6 (six) hours as needed for severe pain (pain score 7-10). 20 tablet 0   No current facility-administered medications for this visit.    HISTORY OF PRESENT ILLNESS:   Oncology History Overview Note  Cancer Staging Pancreatic cancer [facility]) Staging form: Exocrine Pancreas, AJCC 8th Edition - Clinical stage from 08/24/2020: Stage IIB (cT2, cN1, cM0) - Signed by Lanny Callander, MD on 08/30/2020 Stage prefix: Initial diagnosis    Pancreatic cancer (HCC)  08/20/2020 Imaging   CT AP 08/20/20  IMPRESSION: 1. 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 Collateral circulation is noted. There is moderate intrahepatic and extrahepatic biliary dilatation which appears to be due to the pancreatic head mass. Pancreatic ductal dilatation is noted as well. 2. Probable 2.7 cm uterine fibroid. 3. Aortic atherosclerosis.   Aortic Atherosclerosis (ICD10-I70.0).   08/20/2020 Tumor Marker   Ca19-9 - 927   08/22/2020 Procedure   ERCP by Dr Rosalie 08/22/20  IMPRESSION - The major papilla appeared normal. - A biliary sphincterotomy was performed. - Cells for cytology obtained in the lower third and middle of the main duct. - One plastic stent was placed into the common bile duct.   FINAL MICROSCOPIC DIAGNOSIS:  - No malignant cells identified  - Benign reactive/reparative changes   08/24/2020 Cancer Staging   Staging form: Exocrine Pancreas, AJCC 8th Edition - Clinical stage from 08/24/2020: Stage IIB (cT2, cN1, cM0) - Signed by Lanny Callander, MD on 08/30/2020   08/24/2020 Procedure   EUS by Dr burnette 08/24/20  IMPRESSION - There was no sign of significant pathology in the ampulla. - A few malignant-appearing lymph nodes were visualized in the peripancreatic region and porta hepatis region. - One stent was visualized endosonographically in the common bile duct. - A mass was identified in the pancreatic head. Tissue was obtained from this exam. The preliminary diagnosis is consistent with adenocarcinoma. Invasion into SMV/PV seen. Lymphadenopathy noted. This was staged T3 N1 Mx by endosonographic criteria. Fine needle aspiration performed.   08/24/2020 Initial Biopsy   FINAL MICROSCOPIC DIAGNOSIS: 08/24/20 - Malignant cells consistent with adenocarcinoma    08/30/2020 Initial Diagnosis   Pancreatic cancer (HCC)   09/05/2020 Imaging   CT Chest  IMPRESSION: 1. Stable 2.7 cm infiltrating pancreatic head mass with  borderline enlarged peripancreatic lymph nodes. 2. No findings for pulmonary metastatic disease. 3. Small hiatal hernia. 4. Aortic atherosclerosis.   Aortic Atherosclerosis (ICD10-I70.0).   09/08/2020 Procedure   INSERTION PORT-A-CATH by Dr Dasie and Aron    09/12/2020 - 12/29/2020 Chemotherapy   Neoadjuvant FOLFIRINOX q2 weeks starting 09/12/20-12/29/20    12/02/2020 Imaging   CT CAP  IMPRESSION: 1. Previously noted mass of the central pancreatic head is almost entirely resolved, difficult to discretely appreciate on current examination. Findings are consistent with treatment response. There remains obstruction of the pancreatic duct near the head neck junction with mild prominence of the pancreatic duct, measuring up to 5 mm, with atrophy of the distal pancreatic parenchyma. 2. The portal vein, splenic vein, and superior mesenteric vein are now widely patent, previously effaced at the confluence by mass effect. 3. Interval placement of common bile duct stent, tip positioned  in the distal duodenum, with relief of previously seen biliary ductal dilatation. 4. 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 Following the bifurcation of the celiac mesenteric trunk, the celiac axis appears to directly traverse the vicinity of the mass, although there does appear to be a fat plane about the vessel. 5. The distal small bowel and colon are diffusely somewhat hyperenhancing and inflamed appearing with vascular combing and a tethered appearance of the distal small bowel. This appearance generally suggests inflammatory bowel disease such as Crohn's disease. Correlate with referable clinical history, if present. No evidence of obstruction or other acute complication. 6. Hepatic steatosis. 7. Aortic atherosclerosis.   12/28/2020  Genetic Testing   Negative hereditary cancer genetic testing: no pathogenic variants detected in [facility] Common Hereditary Cancers Panel.  The report date is December 28, 2020.    The Common Hereditary Cancers Panel offered by [facility] includes sequencing and/or deletion duplication testing of the following 47 genes: APC, ATM, AXIN2, BARD1, BMPR1A, BRCA1, BRCA2, BRIP1, CDH1, CDK4, CDKN2A (p14ARF), CDKN2A (p16INK4a), CHEK2, CTNNA1, DICER1, EPCAM (Deletion/duplication testing only), GREM1 (promoter region deletion/duplication testing only), GREM1, HOXB13, KIT, MEN1, MLH1, MSH2, MSH3, MSH6, MUTYH, NBN, NF1, NHTL1, PALB2, PDGFRA, PMS2, POLD1, POLE, PTEN, RAD50, RAD51C, RAD51D, SDHA, SDHB, SDHC, SDHD, SMAD4, SMARCA4. STK11, TP53, TSC1, TSC2, and VHL.  The following genes were evaluated for sequence changes only: SDHA and HOXB13 c.251G>A variant only.   01/17/2021 Imaging   CT CAP from [facility] IMPRESSION Small lesion in the pancreatic head measuring approximately 1.1 x 0.8 cm without evidence of vascular involvement or metastasis consistent with previously described, biopsy-proven pancreatic adenocarcinoma.   02/08/2021 Surgery   Whipple Surgery with Dr Abran Kallman  Final Pathologic Diagnosis      A.  GALLBLADDER, CHOLECYSTECTOMY: Chronic cholecystitis. No malignancy identified.   B.  BILE DUCT STENT, REMOVAL (GROSS ONLY DIAGNOSIS): Stent, see gross description.   C.  WHIPPLE RESECTION: No residual malignancy identified. Chronic and acute inflammation with granulation tissue reaction, fibrosis and features of chronic pancreatitis, suggestive of therapy effect. Fourteen lymph nodes, negative for metastasis (0/14). Margins negative for malignancy.        06/07/2021 Imaging   CT AP  IMPRESSION: 1. No current findings of recurrent malignancy. Interval Whipple procedure with expected postoperative findings. 2. A 3.5 cm stent is present in the dorsal pancreatic duct. No duct dilatation. There is an  approximately 1.5 mm lucency centrally along this stent shown on image 43 series 7, possibilities include stent fracture, two separate stents, or an intentional radial lucency in this type of stent. 3. Small type 1 hiatal hernia. Mild distal esophageal wall thickening may reflect low-grade esophagitis. 4.  Aortic Atherosclerosis (ICD10-I70.0). 5.  Prominent stool throughout the colon favors constipation. 6. Uterine fibroid. 7. 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   11/14/2023 - 01/25/2024 Chemotherapy   Patient is on Treatment Plan : PANCREAS NALIRIFOX D1, 15 Q28D     02/20/2024 - 02/20/2024 Chemotherapy   Patient is on Treatment Plan : PANCREATIC Abraxane D1,8,15 + Gemcitabine D1,8,15 q28d     08/21/2024 -  Chemotherapy   Patient is on Treatment Plan : PANCREATIC Abraxane D1,8,15 + Gemcitabine D1,8,15 q28d     Port-A-Cath in place      REVIEW OF SYSTEMS:   Constitutional: Denies fevers, chills or abnormal weight loss. Fatigue.  Eyes: Denies blurriness of vision Ears, nose, mouth, throat, and face: Denies mucositis or sore throat Respiratory: Denies cough, dyspnea or wheezes Cardiovascular: Denies palpitation, chest discomfort or lower extremity swelling Gastrointestinal:  Denies nausea, heartburn or change in bowel habits. Does have multiple loose stools every day. Will need to take  imodium if she has to  leave her home, especially in the mornings.  Skin: Denies abnormal skin rashes Lymphatics: Denies new lymphadenopathy or easy bruising Neurological:Denies numbness, tingling or new weaknesses Behavioral/Psych: Mood is stable, no new changes  All other systems were reviewed with the patient and are negative.   VITALS:   Today's Vitals   09/04/24 0800 09/04/24 0828  BP:  126/66  Pulse:  (!) 55  Resp:  17  Temp:  98.4 F (36.9 C)  TempSrc:  Temporal  SpO2:  99%  Weight:  125 lb 8 oz (56.9 kg)  Height:  5' 1 (1.549 m)   PainSc: 0-No pain    Body mass index is 23.71 kg/m.   Wt Readings from Last 3 Encounters:  09/18/24 127 lb 11.2 oz (57.9 kg)  09/04/24 125 lb 8 oz (56.9 kg)  08/21/24 122 lb 11.2 oz (55.7 kg)    Body mass index is 23.71 kg/m.  Performance status (ECOG): 1 - Symptomatic but completely ambulatory  PHYSICAL EXAM:   GENERAL:alert, no distress and comfortable SKIN: skin color, texture, turgor are normal, no rashes or significant lesions EYES: normal, Conjunctiva are pink and non-injected, sclera clear OROPHARYNX:no exudate, no erythema and lips, buccal mucosa, and tongue normal  NECK: supple, thyroid normal size, non-tender, without nodularity LYMPH:  no palpable lymphadenopathy in the cervical, axillary or inguinal LUNGS: clear to auscultation and percussion with normal breathing effort HEART: regular rate & rhythm and no murmurs and no lower extremity edema ABDOMEN:abdomen soft, non-tender and normal bowel sounds Musculoskeletal:no cyanosis of digits and no clubbing  NEURO: alert & oriented x 3 with fluent speech, no focal motor/sensory deficits  LABORATORY DATA:  I have reviewed the data as listed    Component Value Date/Time   NA 142 09/18/2024 0736   K 3.4 (L) 09/18/2024 0736   CL 110 09/18/2024 0736   CO2 27 09/18/2024 0736   GLUCOSE 79 09/18/2024 0736   BUN 6 (L) 09/18/2024 0736   CREATININE 0.94 09/18/2024 0736   CALCIUM  7.7 (L) 09/18/2024 0736   PROT 5.3 (L) 09/18/2024 0736   ALBUMIN 2.7 (L) 09/18/2024 0736   AST 22 09/18/2024 0736   ALT 16 09/18/2024 0736   ALKPHOS 135 (H) 09/18/2024 0736   BILITOT 0.6 09/18/2024 0736   GFRNONAA >60 09/18/2024 0736   GFRAA >60 08/23/2020 0606    Lab Results  Component Value Date   WBC 5.6 09/18/2024   NEUTROABS 3.0 09/18/2024   HGB 9.2 (L) 09/18/2024   HCT 27.8 (L) 09/18/2024   MCV 89.1 09/18/2024   PLT 361 09/18/2024

## 2024-09-04 ENCOUNTER — Inpatient Hospital Stay

## 2024-09-04 ENCOUNTER — Inpatient Hospital Stay: Admitting: Licensed Clinical Social Worker

## 2024-09-04 ENCOUNTER — Inpatient Hospital Stay: Admitting: Nurse Practitioner

## 2024-09-04 VITALS — BP 126/66 | HR 55 | Temp 98.4°F | Resp 17 | Ht 61.0 in | Wt 125.5 lb

## 2024-09-04 DIAGNOSIS — C250 Malignant neoplasm of head of pancreas: Secondary | ICD-10-CM

## 2024-09-04 DIAGNOSIS — Z5111 Encounter for antineoplastic chemotherapy: Secondary | ICD-10-CM | POA: Diagnosis not present

## 2024-09-04 LAB — CBC WITH DIFFERENTIAL (CANCER CENTER ONLY)
Abs Immature Granulocytes: 0.04 K/uL (ref 0.00–0.07)
Basophils Absolute: 0 K/uL (ref 0.0–0.1)
Basophils Relative: 1 %
Eosinophils Absolute: 0.2 K/uL (ref 0.0–0.5)
Eosinophils Relative: 3 %
HCT: 29.6 % — ABNORMAL LOW (ref 36.0–46.0)
Hemoglobin: 9.4 g/dL — ABNORMAL LOW (ref 12.0–15.0)
Immature Granulocytes: 1 %
Lymphocytes Relative: 17 %
Lymphs Abs: 1 K/uL (ref 0.7–4.0)
MCH: 28.4 pg (ref 26.0–34.0)
MCHC: 31.8 g/dL (ref 30.0–36.0)
MCV: 89.4 fL (ref 80.0–100.0)
Monocytes Absolute: 0.7 K/uL (ref 0.1–1.0)
Monocytes Relative: 12 %
Neutro Abs: 3.8 K/uL (ref 1.7–7.7)
Neutrophils Relative %: 66 %
Platelet Count: 343 K/uL (ref 150–400)
RBC: 3.31 MIL/uL — ABNORMAL LOW (ref 3.87–5.11)
RDW: 15 % (ref 11.5–15.5)
WBC Count: 5.8 K/uL (ref 4.0–10.5)
nRBC: 0 % (ref 0.0–0.2)

## 2024-09-04 LAB — CMP (CANCER CENTER ONLY)
ALT: 11 U/L (ref 0–44)
AST: 13 U/L — ABNORMAL LOW (ref 15–41)
Albumin: 3.1 g/dL — ABNORMAL LOW (ref 3.5–5.0)
Alkaline Phosphatase: 77 U/L (ref 38–126)
Anion gap: 4 — ABNORMAL LOW (ref 5–15)
BUN: 5 mg/dL — ABNORMAL LOW (ref 8–23)
CO2: 28 mmol/L (ref 22–32)
Calcium: 8.4 mg/dL — ABNORMAL LOW (ref 8.9–10.3)
Chloride: 111 mmol/L (ref 98–111)
Creatinine: 1.04 mg/dL — ABNORMAL HIGH (ref 0.44–1.00)
GFR, Estimated: >60 mL/min (ref >60)
Glucose, Bld: 76 mg/dL (ref 70–99)
Potassium: 3.7 mmol/L (ref 3.5–5.1)
Sodium: 143 mmol/L (ref 135–145)
Total Bilirubin: 0.6 mg/dL (ref 0.0–1.2)
Total Protein: 5.5 g/dL — ABNORMAL LOW (ref 6.5–8.1)

## 2024-09-04 MED ORDER — SODIUM CHLORIDE 0.9 % IV SOLN: INTRAVENOUS | Status: DC

## 2024-09-04 MED ORDER — PROCHLORPERAZINE MALEATE 10 MG PO TABS
10.0000 mg | ORAL_TABLET | Freq: Once | ORAL | Status: AC
  Administered 2024-09-04: 10 mg via ORAL
  Filled 2024-09-04: qty 1

## 2024-09-04 MED ORDER — SODIUM CHLORIDE 0.9 % IV SOLN
800.0000 mg/m2 | Freq: Once | INTRAVENOUS | Status: AC
  Administered 2024-09-04: 1216 mg via INTRAVENOUS
  Filled 2024-09-04: qty 31.98

## 2024-09-04 MED ORDER — PACLITAXEL PROTEIN-BOUND CHEMO INJECTION 100 MG
100.0000 mg/m2 | Freq: Once | INTRAVENOUS | Status: AC
  Administered 2024-09-04: 150 mg via INTRAVENOUS
  Filled 2024-09-04: qty 30

## 2024-09-04 NOTE — Patient Instructions (Signed)
 [facility] - A DEPT OF [facility]   Discharge Instructions: Thank you for choosing [facility] to provide your oncology and hematology care.   If you have a lab appointment with the Cancer Center, please go directly to the Cancer Center and check in at the registration area.   Wear comfortable clothing and clothing appropriate for easy access to any Portacath or PICC line.   We strive to give you quality time with your provider. You may need to reschedule your appointment if you arrive late (15 or more minutes).  Arriving late affects you and other patients whose appointments are after yours.  Also, if you miss three or more appointments without notifying the office, you may be dismissed from the clinic at the provider's discretion.      For prescription refill requests, have your pharmacy contact our office and allow 72 hours for refills to be completed.    Today you received the following chemotherapy and/or immunotherapy agents: Paclitaxel protein-bound (Abraxane) and gemcitabine (Gemzar)      To help prevent nausea and vomiting after your treatment, we encourage you to take your nausea medication as directed.  BELOW ARE SYMPTOMS THAT SHOULD BE REPORTED IMMEDIATELY: *FEVER GREATER THAN 100.4 F (38 C) OR HIGHER *CHILLS OR SWEATING *NAUSEA AND VOMITING THAT IS NOT CONTROLLED WITH YOUR NAUSEA MEDICATION *UNUSUAL SHORTNESS OF BREATH *UNUSUAL BRUISING OR BLEEDING *URINARY PROBLEMS (pain or burning when urinating, or frequent urination) *BOWEL PROBLEMS (unusual diarrhea, constipation, pain near the anus) TENDERNESS IN MOUTH AND THROAT WITH OR WITHOUT PRESENCE OF ULCERS (sore throat, sores in mouth, or a toothache) UNUSUAL RASH, SWELLING OR PAIN  UNUSUAL VAGINAL DISCHARGE OR ITCHING   Items with * indicate a potential emergency and should be followed up as soon as possible or go to the Emergency Department if any problems should  occur.  Please show the CHEMOTHERAPY ALERT CARD or IMMUNOTHERAPY ALERT CARD at check-in to the Emergency Department and triage nurse.  Should you have questions after your visit or need to cancel or reschedule your appointment, please contact [facility] - A DEPT OF [facility]  Dept: [phone]  and follow the prompts.  Office hours are 8:00 a.m. to 4:30 p.m. Monday - Friday. Please note that voicemails left after 4:00 p.m. may not be returned until the following business day.  We are closed weekends and major holidays. You have access to a nurse at all times for urgent questions. Please call the main number to the clinic Dept: [phone] and follow the prompts.   For any non-urgent questions, you may also contact your provider using MyChart. We now offer e-Visits for anyone 24 and older to request care online for non-urgent symptoms. For details visit [email or web address].   Also download the MyChart app! Go to the app store, search MyChart, open the app, select [facility], and log in with your MyChart username and password.

## 2024-09-06 ENCOUNTER — Other Ambulatory Visit: Payer: Self-pay

## 2024-09-07 ENCOUNTER — Encounter: Payer: Self-pay | Admitting: Hematology

## 2024-09-07 NOTE — Progress Notes (Signed)
 [facility] CSW Progress Note  Visual merchandiser met w/ pt and spouse in infusion to follow-up on financial concerns.    Interventions: Pt reports the out of pocket medical bills are adding up and they are looking for financial assistance if there are any grants that may be available.  Pt has already applied for financial assistance through [facility] and is on a payment plan.  CSW emailed pt the [employer], as well as the [employer] link to apply for any open grants.  Pt does not receive governmental monetary assistance and does not qualify for the [employer].        Follow Up Plan:  Once [employer] [place] is completed, CSW will complete medical forms and assist w/ submitting.     Devere JONELLE Manna, LCSW Clinical Social Worker [facility]

## 2024-09-17 ENCOUNTER — Other Ambulatory Visit: Payer: Self-pay | Admitting: Nurse Practitioner

## 2024-09-17 DIAGNOSIS — C250 Malignant neoplasm of head of pancreas: Secondary | ICD-10-CM

## 2024-09-17 NOTE — Progress Notes (Unsigned)
 Patient Care Team: [name], DO as PCP - General (Internal Medicine) Lanny Callander, MD as Consulting Physician (Oncology) Dasie Leonor CROME, MD as Consulting Physician (General Surgery)  Clinic Day:  09/18/2024  Referring physician: Lanny Callander, MD  ASSESSMENT & PLAN:   Assessment & Plan: Pancreatic cancer [facility]) stage IIB, [name], [name], [name] -Diagnosed 08/24/20 on EUS with Dr Burnette, which showed mass at head of pancreas, s/p stenting, with SMV and PV invasion, biopsy confirmed adenocarcinoma with few malignant-appearing LNs in peripancreatic and porta hepatis region. Staging was negative for metastatic disease.  -she completed neoadjuvant chemo FOLFIRINOX q2 weeks 09/12/20 - 12/29/20. -s/p whipple surgery by Dr Debora on 02/08/21, path showed no residual disease.  -due to the high recurrence risk of pancreatic cancer, she is under close surveillance.  -Her PET scan from September 30, 2023 showed hypermetabolic soft tissue mass at the previous Whipple surgical site, and a small mildly hypermetabolic nodule in the left lung, concerning for metastatic recurrence. -lung biopsy 10/28/2023 confirmed adenocarcinoma, IHC consistent with metastatic pancreatic ca.  -she started chemo NALIRIFOX  on 11/14/23, had a significant diarrhea, required dose reduction. S/p 6 cycles  -restaging CT 02/03/2024 showed good partial response  -she started consolidation SBRT to lung and pancrease on 03/16/2024 and completed on 03/26/2024 -CT 05/21/2024 showed  treatment response in pancreas and lung nodule, but also showed scattered new left upper lobe pulmonary nodules measure up to 6 mm concerning for progressive pulmonary metastatic disease.  Her tumor marker CA 19.9 dropped down to normal previously and is back to 300's now, concerning for disease progression. She underwent bronchoscopy biopsy which confirmed metastatic recurrence  -she was asymptomatic and reluctant  to have chemo again, so we decided to wait for 2-3 months. -repeated CT 07/29/24 showed disease progression in lung and new peritoneal metastasis -It was recommended she restart chemotherapy with gemcitabine and Abraxane every 2 weeks. -08/21/2024 - Cycle 1 day 1 chemotherapy with gemcitabine and abraxane.  - 09/18/2024 -patient presents for cycle 2 day 1 chemotherapy with gemcitabine and Abraxane.  Overall, tolerating treatment very well.  Will proceed with treatment today and subsequent treatments as currently scheduled.   Anemia Mild and stable anemia with Hgb 9.2 and HCT 27.8.  Continue to monitor with every visit.  Adjust dosing and treatment with blood products or iron infusions as indicated.  Hypocalcemia Calcium  level is 7.7 today.  Albumin is 2.7.  Corrected calcium  is 8.74.  This is appropriate for treatment today.  Bilateral leg swelling Patient reports bilateral leg swelling, worse on left side than right.  This started last night.  States that her primary care provider sent her for physical after last treatment with chemotherapy.  Blood pressure was low and provider discontinued her blood pressure medicine, hydrochlorothiazide.  She was on 12.5 mg tablets at that time.  She denies pain or redness or warmth in either leg.  I suggested lower extremity Doppler due to sudden onset of bilateral lower extremity edema.  Patient declined this test for now.  States this happened previously when she was on therapy and blood pressure medicine was discontinued.  Blood pressure is stable with regular rhythm.  Restart HCTZ at 6.25 mg daily.  Encouraged her to elevate her legs when possible.  Increase water intake.  Advised her to seek emergency care if swelling becomes more severe, painful, or either leg develops redness, warmth, with increased swelling.  She voiced understanding and agreement.  She will  also contact her primary care on Monday for further evaluation of blood pressure and  treatment.  Abdominal pain This is generally present only on the day following chemotherapy treatment.  Currently has prescription for Percocet to take as needed, but will also take Tylenol  when needed for milder pain.  She states she has oxycodone  tablets left in prescription from initial chemotherapy.  Prescription is on expired.  Would like a refill of this if possible.  Will change this to oxycodone  5 mg.  She may take up to 4 times daily if needed for acute pain.  May also take Tylenol  if needed for the severe pain.  She denies abdominal pain today.  States pain resolves after 1 to 2 days following chemotherapy.  Plan Labs reviewed. -Mild and stable anemia. - Unremarkable CMP. Restart HCTZ 6.25 mg daily.  Advised her to elevate her legs and increase water intake.  Recommend emergency care over the weekend if pain, warmth, swelling, or redness develop.  She will contact her primary care on Monday to reassess blood pressure. Labs and patient presentation are appropriate for treatment today. - Proceed with cycle 2 day 1 chemotherapy gemcitabine and Abraxane. Plan for labs/flush, follow-up, and next treatment as scheduled.  The patient understands the plans discussed today and is in agreement with them.  She knows to contact our office if she develops concerns prior to her next appointment.  I provided 25 minutes of face-to-face time during this encounter and > 50% was spent counseling as documented under my assessment and plan.    Powell FORBES Lessen, NP  [facility] - A DEPT OF [facility] [address] [place] [address] Dept: [phone] Dept Fax: [phone]   No orders of the defined types were placed in this encounter.     CHIEF COMPLAINT:  CC: Pancreatic cancer  Current Treatment: Chemotherapy gemcitabine and Abraxane every 2 weeks  INTERVAL HISTORY:  Jacqueline Tyler is here today for repeat clinical assessment.   She last saw me on 09/04/2024.  Today, she presents for cycle 2 day 1 chemotherapy gemcitabine and Abraxane.  Overall, she reports doing well.  She did notice bilateral ankle swelling yesterday.  Worse on left side than right.  They feel tight but no pain.  She states she saw her primary care last week for annual physical.  She was taken off her blood pressure medication, HCTZ due to blood pressure running low.  States this has happened in the past when blood pressure medicine has been adjusted.  She denies chest pain, chest pressure, or shortness of breath. She denies headaches or visual disturbances. She denies abdominal pain, nausea, vomiting, or changes in bowel or bladder habits.  She reports loose stools, especially first thing in the morning.  Able to take Imodium which will help.  She denies fevers or chills. She denies pain. Her appetite is fair. Her weight has been stable.  I have reviewed the past medical history, past surgical history, social history and family history with the patient and they are unchanged from previous note.  ALLERGIES:  is allergic to irinotecan  liposome, tyloxapol, oxycodone -acetaminophen , and poison ivy extract.  MEDICATIONS:  Current Outpatient Medications  Medication Sig Dispense Refill   hydrochlorothiazide (HYDRODIURIL) 12.5 MG tablet Take 1/2 tablet po every day 30 tablet 0   oxyCODONE  (OXY IR/ROXICODONE ) 5 MG immediate release tablet Take 1 tablet (5 mg total) by mouth every 6 (six) hours as needed for  severe pain (pain score 7-10). 20 tablet 0   acetaminophen  (TYLENOL ) 325 MG tablet Take 650 mg by mouth every 6 (six) hours as needed for moderate pain (pain score 4-6).     busPIRone (BUSPAR) 5 MG tablet Take 5 mg by mouth daily.     citalopram  (CELEXA ) 40 MG tablet Take 40 mg by mouth every evening.     CREON  36000-114000 units CPEP capsule TAKE 2 CAPSULES BY MOUTH 3 TIMES A DAY WITH A MEAL. MAY ALSO TAKE 1 CAPSULE AS NEEDED WITH SNACKS 240 capsule 2   KLOR-CON   M20 20 MEQ tablet TAKE 1 TABLET BY MOUTH TWICE A DAY 180 tablet 1   magnesium  oxide (MAG-OX) 400 (241.3 Mg) MG tablet Take 1 tablet (400 mg total) by mouth every evening.     ondansetron  (ZOFRAN ) 8 MG tablet Take 1 tablet (8 mg total) by mouth every 8 (eight) hours as needed for nausea or vomiting. 30 tablet 1   pantoprazole  (PROTONIX ) 40 MG tablet Take 1 tablet (40 mg total) by mouth 2 (two) times daily before a meal. Twice daily for 2 months then once daily thereafter. 60 tablet 11   potassium chloride  (KLOR-CON ) 10 MEQ tablet Take 1 tablet (10 mEq total) by mouth 2 (two) times daily. 60 tablet 1   prochlorperazine  (COMPAZINE ) 10 MG tablet Take 1 tablet (10 mg total) by mouth every 6 (six) hours as needed for nausea or vomiting. 30 tablet 1   simvastatin (ZOCOR) 80 MG tablet Take 80 mg by mouth every evening.     No current facility-administered medications for this visit.   Facility-Administered Medications Ordered in Other Visits  Medication Dose Route Frequency Provider Last Rate Last Admin   0.9 %  sodium chloride  infusion   Intravenous Continuous Lanny Callander, MD   Stopped at 09/18/24 1227   sodium chloride  flush (NS) 0.9 % injection 10 mL  10 mL Intracatheter PRN Lanny Callander, MD        HISTORY OF PRESENT ILLNESS:   Oncology History Overview Note  Cancer Staging Pancreatic cancer [facility]) Staging form: Exocrine Pancreas, AJCC 8th Edition - Clinical stage from 08/24/2020: Stage IIB (cT2, cN1, cM0) - Signed by Lanny Callander, MD on 08/30/2020 Stage prefix: Initial diagnosis    Pancreatic cancer (HCC)  08/20/2020 Imaging   CT AP 08/20/20  IMPRESSION: 1. 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 Collateral circulation is noted. There is moderate intrahepatic and extrahepatic biliary dilatation which appears to be  due to the pancreatic head mass. Pancreatic ductal dilatation is noted as well. 2. Probable 2.7 cm uterine fibroid. 3. Aortic atherosclerosis.   Aortic Atherosclerosis (ICD10-I70.0).   08/20/2020 Tumor Marker   Ca19-9 - 927   08/22/2020 Procedure   ERCP by Dr Rosalie 08/22/20  IMPRESSION - The major papilla appeared normal. - A biliary sphincterotomy was performed. - Cells for cytology obtained in the lower third and middle of the main duct. - One plastic stent was placed into the common bile duct.   FINAL MICROSCOPIC DIAGNOSIS:  - No malignant cells identified  - Benign reactive/reparative changes   08/24/2020 Cancer Staging   Staging form: Exocrine Pancreas, AJCC 8th Edition - Clinical stage from 08/24/2020: Stage IIB (cT2, cN1, cM0) - Signed by Lanny Callander, MD on 08/30/2020   08/24/2020 Procedure  EUS by Dr burnette 08/24/20  IMPRESSION - There was no sign of significant pathology in the ampulla. - A few malignant-appearing lymph nodes were visualized in the peripancreatic region and porta hepatis region. - One stent was visualized endosonographically in the common bile duct. - A mass was identified in the pancreatic head. Tissue was obtained from this exam. The preliminary diagnosis is consistent with adenocarcinoma. Invasion into SMV/PV seen. Lymphadenopathy noted. This was staged T3 N1 Mx by endosonographic criteria. Fine needle aspiration performed.   08/24/2020 Initial Biopsy   FINAL MICROSCOPIC DIAGNOSIS: 08/24/20 - Malignant cells consistent with adenocarcinoma    08/30/2020 Initial Diagnosis   Pancreatic cancer (HCC)   09/05/2020 Imaging   CT Chest  IMPRESSION: 1. Stable 2.7 cm infiltrating pancreatic head mass with borderline enlarged peripancreatic lymph nodes. 2. No findings for pulmonary metastatic disease. 3. Small hiatal hernia. 4. Aortic atherosclerosis.   Aortic Atherosclerosis (ICD10-I70.0).   09/08/2020 Procedure   INSERTION PORT-A-CATH by Dr Dasie and  Aron    09/12/2020 - 12/29/2020 Chemotherapy   Neoadjuvant FOLFIRINOX q2 weeks starting 09/12/20-12/29/20    12/02/2020 Imaging   CT CAP  IMPRESSION: 1. Previously noted mass of the central pancreatic head is almost entirely resolved, difficult to discretely appreciate on current examination. Findings are consistent with treatment response. There remains obstruction of the pancreatic duct near the head neck junction with mild prominence of the pancreatic duct, measuring up to 5 mm, with atrophy of the distal pancreatic parenchyma. 2. The portal vein, splenic vein, and superior mesenteric vein are now widely patent, previously effaced at the confluence by mass effect. 3. Interval placement of common bile duct stent, tip positioned in the distal duodenum, with relief of previously seen biliary ductal dilatation. 4. 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 Following the bifurcation of the celiac mesenteric trunk, the celiac axis appears to directly traverse the vicinity of the mass, although there does appear to be a fat plane about the vessel. 5. The distal small bowel and colon are diffusely somewhat hyperenhancing and inflamed appearing with vascular combing and a tethered appearance of the distal small bowel. This appearance generally suggests inflammatory bowel disease such as Crohn's disease. Correlate with referable clinical history, if present. No evidence of obstruction or other acute complication. 6. Hepatic steatosis. 7. Aortic atherosclerosis.   12/28/2020 Genetic Testing   Negative hereditary cancer genetic testing: no pathogenic variants detected in [employer] Common Hereditary Cancers Panel.  The report date is December 28, 2020.    The Common Hereditary Cancers Panel offered by [facility] includes sequencing  and/or deletion duplication testing of the following 47 genes: APC, ATM, AXIN2, BARD1, BMPR1A, BRCA1, BRCA2, BRIP1, CDH1, CDK4, CDKN2A (p14ARF), CDKN2A (p16INK4a), CHEK2, CTNNA1, DICER1, EPCAM (Deletion/duplication testing only), GREM1 (promoter region deletion/duplication testing only), GREM1, HOXB13, KIT, MEN1, MLH1, MSH2, MSH3, MSH6, MUTYH, NBN, NF1, NHTL1, PALB2, PDGFRA, PMS2, POLD1, POLE, PTEN, RAD50, RAD51C, RAD51D, SDHA, SDHB, SDHC, SDHD, SMAD4, SMARCA4. STK11, TP53, TSC1, TSC2, and VHL.  The following genes were evaluated for sequence changes only: SDHA and HOXB13 c.251G>A variant only.   01/17/2021 Imaging   CT CAP from [facility] IMPRESSION Small lesion in the pancreatic head measuring approximately 1.1 x 0.8 cm without evidence of vascular involvement or metastasis consistent with previously described, biopsy-proven pancreatic adenocarcinoma.   02/08/2021 Surgery   Whipple Surgery with Dr Abran Kallman  Final  Pathologic Diagnosis      A.  GALLBLADDER, CHOLECYSTECTOMY: Chronic cholecystitis. No malignancy identified.   B.  BILE DUCT STENT, REMOVAL (GROSS ONLY DIAGNOSIS): Stent, see gross description.   C.  WHIPPLE RESECTION: No residual malignancy identified. Chronic and acute inflammation with granulation tissue reaction, fibrosis and features of chronic pancreatitis, suggestive of therapy effect. Fourteen lymph nodes, negative for metastasis (0/14). Margins negative for malignancy.        06/07/2021 Imaging   CT AP  IMPRESSION: 1. No current findings of recurrent malignancy. Interval Whipple procedure with expected postoperative findings. 2. A 3.5 cm stent is present in the dorsal pancreatic duct. No duct dilatation. There is an approximately 1.5 mm lucency centrally along this stent shown on image 43 series 7, possibilities include stent fracture, two separate stents, or an intentional radial lucency in this type of stent. 3. Small type 1 hiatal hernia. Mild distal esophageal  wall thickening may reflect low-grade esophagitis. 4.  Aortic Atherosclerosis (ICD10-I70.0). 5.  Prominent stool throughout the colon favors constipation. 6. Uterine fibroid. 7. 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   11/14/2023 - 01/25/2024 Chemotherapy   Patient is on Treatment Plan : PANCREAS NALIRIFOX D1, 15 Q28D     02/20/2024 - 02/20/2024 Chemotherapy   Patient is on Treatment Plan : PANCREATIC Abraxane D1,8,15 + Gemcitabine D1,8,15 q28d     08/21/2024 -  Chemotherapy   Patient is on Treatment Plan : PANCREATIC Abraxane D1,8,15 + Gemcitabine D1,8,15 q28d     Port-A-Cath in place      REVIEW OF SYSTEMS:   Constitutional: Denies fevers, chills or abnormal weight loss Eyes: Denies blurriness of vision Ears, nose, mouth, throat, and face: Denies mucositis or sore throat Respiratory: Denies cough, dyspnea or wheezes Cardiovascular: Denies palpitation,, chest pressure or chest tightness.  She is experiencing mild, bilateral lower extremity swelling. Gastrointestinal: She generally has nausea on the day of chemotherapy which lasts for 1 to 2 days after treatment.  She has prescriptions for Compazine  and Zofran  at home.  She also has loose stools the day following chemotherapy.  Imodium is helpful. Skin: Denies abnormal skin rashes Lymphatics: Denies new lymphadenopathy or easy bruising Neurological:Denies numbness, tingling or new weaknesses Behavioral/Psych: Mood is stable, no new changes  All other systems were reviewed with the patient and are negative.   VITALS:   Today's Vitals   09/18/24 0812  BP: 138/78  Pulse: (!) 54  Resp: 17  Temp: 97.7 F (36.5 C)  SpO2: 99%  Weight: 127 lb 11.2 oz (57.9 kg)   Body mass index is 24.13 kg/m.   Wt Readings from Last 3 Encounters:  09/18/24 127 lb 11.2 oz (57.9 kg)  09/04/24 125 lb 8 oz (56.9 kg)  08/21/24 122 lb 11.2 oz (55.7 kg)    Body mass index is 24.13  kg/m.  Performance status (ECOG): 1 - Symptomatic but completely ambulatory  PHYSICAL EXAM:   GENERAL:alert, no distress and comfortable SKIN: skin color, texture, turgor are normal, no rashes or significant lesions EYES: normal, Conjunctiva are pink and non-injected, sclera clear OROPHARYNX:no exudate, no erythema and lips, buccal mucosa, and tongue normal  NECK: supple, thyroid normal size, non-tender, without nodularity LYMPH:  no palpable lymphadenopathy in the cervical, axillary or inguinal LUNGS: clear to auscultation and percussion with normal breathing effort HEART: regular rate & rhythm and no murmurs.  She does have 1+ pitting edema in bilateral lower extremities, left slightly worse than right.  No redness,  no warmth, and no pain present with palpation.  Pedal pulses are palpable bilaterally.  Normal capillary refill noted in all toes. ABDOMEN:abdomen soft, non-tender and normal bowel sounds Musculoskeletal:no cyanosis of digits and no clubbing  NEURO: alert & oriented x 3 with fluent speech, no focal motor/sensory deficits  LABORATORY DATA:  I have reviewed the data as listed    Component Value Date/Time   NA 142 09/18/2024 0736   K 3.4 (L) 09/18/2024 0736   CL 110 09/18/2024 0736   CO2 27 09/18/2024 0736   GLUCOSE 79 09/18/2024 0736   BUN 6 (L) 09/18/2024 0736   CREATININE 0.94 09/18/2024 0736   CALCIUM  7.7 (L) 09/18/2024 0736   PROT 5.3 (L) 09/18/2024 0736   ALBUMIN 2.7 (L) 09/18/2024 0736   AST 22 09/18/2024 0736   ALT 16 09/18/2024 0736   ALKPHOS 135 (H) 09/18/2024 0736   BILITOT 0.6 09/18/2024 0736   GFRNONAA >60 09/18/2024 0736   GFRAA >60 08/23/2020 0606   Lab Results  Component Value Date   WBC 5.6 09/18/2024   NEUTROABS 3.0 09/18/2024   HGB 9.2 (L) 09/18/2024   HCT 27.8 (L) 09/18/2024   MCV 89.1 09/18/2024   PLT 361 09/18/2024

## 2024-09-17 NOTE — Assessment & Plan Note (Signed)
 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 Staging was negative for metastatic disease.  -she completed neoadjuvant chemo FOLFIRINOX q2 weeks 09/12/20 - 12/29/20. -s/p whipple surgery by Dr Debora on 02/08/21, path showed no residual disease.  -due to the high recurrence risk of pancreatic cancer, she is under close surveillance.  -Her PET scan from September 30, 2023 showed hypermetabolic soft tissue mass at the previous Whipple surgical site, and a small mildly hypermetabolic nodule in the left lung, concerning for metastatic recurrence. -lung biopsy 10/28/2023 confirmed adenocarcinoma, IHC consistent with metastatic pancreatic ca.  -she started chemo NALIRIFOX  on 11/14/23, had a significant diarrhea, required dose reduction. S/p 6 cycles  -restaging CT 02/03/2024 showed good partial response  -she started consolidation SBRT to lung and pancrease on 03/16/2024 and completed on 03/26/2024 -CT 05/21/2024 showed  treatment response in pancreas and lung nodule, but also showed scattered new left upper lobe pulmonary nodules measure up to 6 mm concerning for progressive pulmonary metastatic disease.  Her tumor marker CA 19.9 dropped down to normal previously and is back to 300's now, concerning for disease progression. She underwent bronchoscopy biopsy which confirmed metastatic recurrence  -she was asymptomatic and reluctant to have chemo again, so we decided to wait for 2-3 months. -repeated CT 07/29/24 showed disease progression in lung and new peritoneal metastasis -It was recommended she restart chemotherapy with gemcitabine and Abraxane every 2 weeks. -08/21/2024 - Cycle 1 day 1 chemotherapy with gemcitabine and abraxane.  - 09/18/2024  -patient presents for cycle 2 day 1 chemotherapy with gemcitabine and Abraxane.

## 2024-09-18 ENCOUNTER — Inpatient Hospital Stay: Admitting: Nurse Practitioner

## 2024-09-18 ENCOUNTER — Inpatient Hospital Stay

## 2024-09-18 ENCOUNTER — Encounter: Payer: Self-pay | Admitting: Nurse Practitioner

## 2024-09-18 VITALS — BP 138/70

## 2024-09-18 VITALS — BP 138/78 | HR 54 | Temp 97.7°F | Resp 17 | Wt 127.7 lb

## 2024-09-18 DIAGNOSIS — C250 Malignant neoplasm of head of pancreas: Secondary | ICD-10-CM

## 2024-09-18 DIAGNOSIS — Z5111 Encounter for antineoplastic chemotherapy: Secondary | ICD-10-CM | POA: Diagnosis not present

## 2024-09-18 LAB — CMP (CANCER CENTER ONLY)
ALT: 16 U/L (ref 0–44)
AST: 22 U/L (ref 15–41)
Albumin: 2.7 g/dL — ABNORMAL LOW (ref 3.5–5.0)
Alkaline Phosphatase: 135 U/L — ABNORMAL HIGH (ref 38–126)
Anion gap: 5 (ref 5–15)
BUN: 6 mg/dL — ABNORMAL LOW (ref 8–23)
CO2: 27 mmol/L (ref 22–32)
Calcium: 7.7 mg/dL — ABNORMAL LOW (ref 8.9–10.3)
Chloride: 110 mmol/L (ref 98–111)
Creatinine: 0.94 mg/dL (ref 0.44–1.00)
GFR, Estimated: >60 mL/min (ref >60)
Glucose, Bld: 79 mg/dL (ref 70–99)
Potassium: 3.4 mmol/L — ABNORMAL LOW (ref 3.5–5.1)
Sodium: 142 mmol/L (ref 135–145)
Total Bilirubin: 0.6 mg/dL (ref 0.0–1.2)
Total Protein: 5.3 g/dL — ABNORMAL LOW (ref 6.5–8.1)

## 2024-09-18 LAB — CBC WITH DIFFERENTIAL (CANCER CENTER ONLY)
Abs Immature Granulocytes: 0.06 K/uL (ref 0.00–0.07)
Basophils Absolute: 0 K/uL (ref 0.0–0.1)
Basophils Relative: 1 %
Eosinophils Absolute: 0.2 K/uL (ref 0.0–0.5)
Eosinophils Relative: 4 %
HCT: 27.8 % — ABNORMAL LOW (ref 36.0–46.0)
Hemoglobin: 9.2 g/dL — ABNORMAL LOW (ref 12.0–15.0)
Immature Granulocytes: 1 %
Lymphocytes Relative: 22 %
Lymphs Abs: 1.3 K/uL (ref 0.7–4.0)
MCH: 29.5 pg (ref 26.0–34.0)
MCHC: 33.1 g/dL (ref 30.0–36.0)
MCV: 89.1 fL (ref 80.0–100.0)
Monocytes Absolute: 1 K/uL (ref 0.1–1.0)
Monocytes Relative: 19 %
Neutro Abs: 3 K/uL (ref 1.7–7.7)
Neutrophils Relative %: 53 %
Platelet Count: 361 K/uL (ref 150–400)
RBC: 3.12 MIL/uL — ABNORMAL LOW (ref 3.87–5.11)
RDW: 17.9 % — ABNORMAL HIGH (ref 11.5–15.5)
WBC Count: 5.6 K/uL (ref 4.0–10.5)
nRBC: 0 % (ref 0.0–0.2)

## 2024-09-18 MED ORDER — HYDROCHLOROTHIAZIDE 12.5 MG PO TABS: ORAL_TABLET | ORAL | 0 refills | Status: DC

## 2024-09-18 MED ORDER — OXYCODONE HCL 5 MG PO TABS: 5.0000 mg | ORAL_TABLET | Freq: Four times a day (QID) | ORAL | 0 refills | Status: DC | PRN

## 2024-09-18 MED ORDER — SODIUM CHLORIDE 0.9 % IV SOLN
800.0000 mg/m2 | Freq: Once | INTRAVENOUS | Status: AC
  Administered 2024-09-18: 1216 mg via INTRAVENOUS
  Filled 2024-09-18: qty 31.98

## 2024-09-18 MED ORDER — PACLITAXEL PROTEIN-BOUND CHEMO INJECTION 100 MG
100.0000 mg/m2 | Freq: Once | INTRAVENOUS | Status: AC
  Administered 2024-09-18: 150 mg via INTRAVENOUS
  Filled 2024-09-18: qty 30

## 2024-09-18 MED ORDER — PROCHLORPERAZINE MALEATE 10 MG PO TABS
10.0000 mg | ORAL_TABLET | Freq: Once | ORAL | Status: AC
  Administered 2024-09-18: 10 mg via ORAL
  Filled 2024-09-18: qty 1

## 2024-09-18 MED ORDER — ONDANSETRON 4 MG PO TBDP
4.0000 mg | ORAL_TABLET | Freq: Once | ORAL | Status: AC
  Administered 2024-09-18: 4 mg via ORAL
  Filled 2024-09-18: qty 1

## 2024-09-18 MED ORDER — SODIUM CHLORIDE 0.9 % IV SOLN: INTRAVENOUS | Status: DC

## 2024-09-18 MED ORDER — SODIUM CHLORIDE 0.9% FLUSH: 10.0000 mL | INTRAVENOUS | Status: DC | PRN

## 2024-09-18 NOTE — Progress Notes (Signed)
 Order placed for Zofran  4mg  ODT x 1 per Powell Lessen, NP.  Raedyn Klinck, PharmD, MBA

## 2024-09-18 NOTE — Patient Instructions (Signed)
 [facility] - A DEPT OF [facility] [place]  Discharge Instructions: Thank you for choosing [facility] to provide your oncology and hematology care.   If you have a lab appointment with the Cancer Center, please go directly to the Cancer Center and check in at the registration area.   Wear comfortable clothing and clothing appropriate for easy access to any Portacath or PICC line.   We strive to give you quality time with your provider. You may need to reschedule your appointment if you arrive late (15 or more minutes).  Arriving late affects you and other patients whose appointments are after yours.  Also, if you miss three or more appointments without notifying the office, you may be dismissed from the clinic at the provider's discretion.      For prescription refill requests, have your pharmacy contact our office and allow 72 hours for refills to be completed.    Today you received the following chemotherapy and/or immunotherapy agents abraxane and gemzar       To help prevent nausea and vomiting after your treatment, we encourage you to take your nausea medication as directed.  BELOW ARE SYMPTOMS THAT SHOULD BE REPORTED IMMEDIATELY: *FEVER GREATER THAN 100.4 F (38 C) OR HIGHER *CHILLS OR SWEATING *NAUSEA AND VOMITING THAT IS NOT CONTROLLED WITH YOUR NAUSEA MEDICATION *UNUSUAL SHORTNESS OF BREATH *UNUSUAL BRUISING OR BLEEDING *URINARY PROBLEMS (pain or burning when urinating, or frequent urination) *BOWEL PROBLEMS (unusual diarrhea, constipation, pain near the anus) TENDERNESS IN MOUTH AND THROAT WITH OR WITHOUT PRESENCE OF ULCERS (sore throat, sores in mouth, or a toothache) UNUSUAL RASH, SWELLING OR PAIN  UNUSUAL VAGINAL DISCHARGE OR ITCHING   Items with * indicate a potential emergency and should be followed up as soon as possible or go to the Emergency Department if any problems should occur.  Please show the CHEMOTHERAPY ALERT CARD or  IMMUNOTHERAPY ALERT CARD at check-in to the Emergency Department and triage nurse.  Should you have questions after your visit or need to cancel or reschedule your appointment, please contact [facility] - [facility] DE[facility]  Dept: [phone]  and follow the prompts.  Office hours are 8:00 a.m. to 4:30 p.m. Monday - Friday. Please note that voicemails left after 4:00 p.m. may not be returned until the following business day.  We are closed weekends and major holidays. You have access to a nurse at all times for urgent questions. Please call the main number to the clinic Dept: [phone] and follow the prompts.   For any non-urgent questions, you may also contact your provider using MyChart. We now offer e-Visits for anyone 58 and older to request care online for non-urgent symptoms. For details visit [email or web address].   Also download the MyChart app! Go to the app store, search "MyChart", open the app, select [place], and log in with your MyChart username and password.

## 2024-09-19 LAB — CANCER ANTIGEN 19-9: CA 19-9: 2605 U/mL — ABNORMAL HIGH (ref 0–35)

## 2024-09-20 ENCOUNTER — Encounter: Payer: Self-pay | Admitting: Nurse Practitioner

## 2024-09-20 ENCOUNTER — Encounter: Payer: Self-pay | Admitting: Hematology

## 2024-09-23 ENCOUNTER — Telehealth: Payer: Self-pay | Admitting: Hematology

## 2024-09-23 NOTE — Telephone Encounter (Signed)
 Called with patient to regarding her appts. She aware of her times and dates.

## 2024-09-24 ENCOUNTER — Other Ambulatory Visit: Payer: Self-pay

## 2024-10-01 NOTE — Assessment & Plan Note (Signed)
 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 Staging was negative for metastatic disease.  -she completed neoadjuvant chemo FOLFIRINOX q2 weeks 09/12/20 - 12/29/20. -s/p whipple surgery by Dr Debora on 02/08/21, path showed no residual disease.  -due to the high recurrence risk of pancreatic cancer, she is under close surveillance.  -Her PET scan from September 30, 2023 showed hypermetabolic soft tissue mass at the previous Whipple surgical site, and a small mildly hypermetabolic nodule in the left lung, concerning for metastatic recurrence. -lung biopsy 10/28/2023 confirmed adenocarcinoma, IHC consistent with metastatic pancreatic ca.  -she started chemo NALIRIFOX  on 11/14/23, had a significant diarrhea, required dose reduction. S/p 6 cycles  -restaging CT 02/03/2024 showed good partial response  -she started consolidation SBRT to lung and pancrease on 03/16/2024 and completed on 03/26/2024 -CT 05/21/2024 showed  treatment response in pancreas and lung nodule, but also showed scattered new left upper lobe pulmonary nodules measure up to 6 mm concerning for progressive pulmonary metastatic disease.  Her tumor marker CA 19.9 dropped down to normal previously and is back to 300's now, concerning for disease progression. She underwent bronchoscopy biopsy which confirmed metastatic recurrence  -she was asymptomatic and reluctant to have chemo again, so we decided to wait for 2-3 months. -repeated CT 07/29/24 showed disease progression in lung and new peritoneal metastasis -It was recommended she restart chemotherapy with gemcitabine and Abraxane every 2 weeks. -08/21/2024 - Cycle 1 day 1 chemotherapy with gemcitabine and abraxane.  - 09/18/2024  -patient presents for cycle 2 day 1 chemotherapy with gemcitabine and Abraxane.  Overall, tolerating treatment very well.  Will proceed with treatment today and subsequent treatments as currently scheduled. - 10/02/2024 -she continues to tolerate chemotherapy well.  Bilateral LE Doppler study ordered for lower extremities to evaluate for DVT due to mild swelling and slight tenderness of the lower extremities.  Treat for DVT as indicated.  Increase p.o. potassium intake to twice daily.  Proceed with cycle 2 day 15 chemotherapy gemcitabine and Abraxane.  Subsequent treatments as scheduled.

## 2024-10-01 NOTE — Progress Notes (Unsigned)
 Patient Care Team: [name], DO as PCP - General (Internal Medicine) Lanny Callander, MD as Consulting Physician (Oncology) Dasie Leonor CROME, MD as Consulting Physician (General Surgery)  Clinic Day:  10/02/2024  Referring physician: Lanny Callander, MD  ASSESSMENT & PLAN:   Assessment & Plan: Pancreatic cancer [facility]) stage IIB, [name], [name], [name] -Diagnosed 08/24/20 on EUS with Dr Burnette, which showed mass at head of pancreas, s/p stenting, with SMV and PV invasion, biopsy confirmed adenocarcinoma with few malignant-appearing LNs in peripancreatic and porta hepatis region. Staging was negative for metastatic disease.  -she completed neoadjuvant chemo FOLFIRINOX q2 weeks 09/12/20 - 12/29/20. -s/p whipple surgery by Dr Debora on 02/08/21, path showed no residual disease.  -due to the high recurrence risk of pancreatic cancer, she is under close surveillance.  -Her PET scan from September 30, 2023 showed hypermetabolic soft tissue mass at the previous Whipple surgical site, and a small mildly hypermetabolic nodule in the left lung, concerning for metastatic recurrence. -lung biopsy 10/28/2023 confirmed adenocarcinoma, IHC consistent with metastatic pancreatic ca.  -she started chemo NALIRIFOX  on 11/14/23, had a significant diarrhea, required dose reduction. S/p 6 cycles  -restaging CT 02/03/2024 showed good partial response  -she started consolidation SBRT to lung and pancrease on 03/16/2024 and completed on 03/26/2024 -CT 05/21/2024 showed  treatment response in pancreas and lung nodule, but also showed scattered new left upper lobe pulmonary nodules measure up to 6 mm concerning for progressive pulmonary metastatic disease.  Her tumor marker CA 19.9 dropped down to normal previously and is back to 300's now, concerning for disease progression. She underwent bronchoscopy biopsy which confirmed metastatic recurrence  -she was asymptomatic and reluctant  to have chemo again, so we decided to wait for 2-3 months. -repeated CT 07/29/24 showed disease progression in lung and new peritoneal metastasis -It was recommended she restart chemotherapy with gemcitabine and Abraxane every 2 weeks. -08/21/2024 - Cycle 1 day 1 chemotherapy with gemcitabine and abraxane.  - 09/18/2024 -patient presents for cycle 2 day 1 chemotherapy with gemcitabine and Abraxane.  Overall, tolerating treatment very well.  Will proceed with treatment today and subsequent treatments as currently scheduled. - 10/02/2024 -she continues to tolerate chemotherapy well.  Bilateral LE Doppler study ordered for lower extremities to evaluate for DVT due to mild swelling and slight tenderness of the lower extremities.  Treat for DVT as indicated.  Increase p.o. potassium intake to twice daily.  Proceed with cycle 2 day 15 chemotherapy gemcitabine and Abraxane.  Subsequent treatments as scheduled.    Anemia Mild and stable anemia with Hgb 9.3 and HCT 28.8.  Continue to monitor with every visit.  Adjust dosing and treatment with blood products or iron infusions as indicated.   Hypocalcemia Calcium  level is 7.8 today.  Albumin is 2.7.  Corrected calcium  is 8.84.  This is appropriate for treatment today.  Hypokalemia Potassium is 3.2 today.  Patient states she is taking 1 oral potassium supplement daily.  Prescription is for twice daily.  Patient states she will begin taking potassium supplement twice daily.  Will give 1 round 10 mEq potassium during chemotherapy and treatment today.   Bilateral leg swelling Patient reports bilateral leg swelling, worse on left side than right.  This started just prior to her last visit.  She did add back low-dose hydrochlorothiazide.  She has also started elevating her legs every evening.  Both legs are less swollen, the difference is not significant.  Swelling is  mild.  Slight tenderness with palpation of medial left lower leg and ankle.  No redness or warmth is  noted.  Pain does not worsen with pressure or exertion.  Pedal pulses are robust bilaterally.  I do have low suspicion for DVT however she is on chemotherapy, making her at increased risk for blood clot during treatment.  Will get bilateral lower extremity Doppler later today to evaluate for DVT.  Will treat as indicated.  The patient is aware of appointment time and where to go for ultrasound.  Plan The patient was seen in infusion suite. Labs reviewed. -Mild and stable anemia with Hgb 9.3 and HCT  28.8. - Slightly worse hypokalemia with K at 3.2. -- Increase oral potassium supplement to twice daily.  Give 110 mEq potassium during chemotherapy today. Continue with HCTZ to reduce swelling of bilateral lower extremities.  Lower extremity Doppler ordered to evaluate for DVT.  Will treat as indicated. -Mild elevation of LFTs.  Will continue to monitor closely. Patient labs and presentation are appropriate for treatment today. Proceed with cycle 2 day 15 chemotherapy with gemcitabine and Abraxane. Labs/flush, follow-up, and cycle 3 day 1 chemotherapy as scheduled.   The patient understands the plans discussed today and is in agreement with them.  She knows to contact our office if she develops concerns prior to her next appointment.  I provided 25 minutes of face-to-face time during this encounter and > 50% was spent counseling as documented under my assessment and plan.    Powell FORBES Lessen, NP  [facility] - A DEPT OF [facility] [address] [place] [address] Dept: [phone] Dept Fax: [phone]   No orders of the defined types were placed in this encounter.     CHIEF COMPLAINT:  CC: malignant neoplasm on head of pancreas  Current Treatment:  chemotherapy with gemcitabine and abraxane every 2 weeks  INTERVAL HISTORY:  Jacqueline Tyler is here today for repeat clinical assessment. The patient last saw me on  09/18/2024. She presents for Cycle 2 day 15 chemotherapy gemcitabine and abraxane.  She continues to have lower extremity swelling. This started just prior to her last visit.  She did add back low-dose hydrochlorothiazide.  She has also started elevating her legs every evening.  Both legs are less swollen, the difference is not significant. Swelling is mild. Slight tenderness with palpation of medial left lower leg and ankle.  She denies chest pain, chest pressure, or shortness of breath. She denies headaches or visual disturbances. She denies abdominal pain, nausea, vomiting, or changes in bowel or bladder habits. She denies fevers or chills. She denies pain. Her appetite is good. Her weight has been stable.  I have reviewed the past medical history, past surgical history, social history and family history with the patient and they are unchanged from previous note.  ALLERGIES:  is allergic to irinotecan  liposome, tyloxapol, oxycodone -acetaminophen , and poison ivy extract.  MEDICATIONS:  Current Outpatient Medications  Medication Sig Dispense Refill   acetaminophen  (TYLENOL ) 325 MG tablet Take 650 mg by mouth every 6 (six) hours as needed for moderate pain (pain score 4-6).     busPIRone (BUSPAR) 5 MG tablet Take 5 mg by mouth daily.     citalopram  (CELEXA ) 40 MG tablet Take 40 mg by mouth every evening.     CREON  36000-114000 units CPEP capsule TAKE 2 CAPSULES BY MOUTH 3 TIMES A DAY WITH A MEAL. MAY ALSO TAKE  1 CAPSULE AS NEEDED WITH SNACKS 240 capsule 2   hydrochlorothiazide (HYDRODIURIL) 12.5 MG tablet Take 1/2 tablet po every day 30 tablet 0   KLOR-CON  M20 20 MEQ tablet TAKE 1 TABLET BY MOUTH TWICE A DAY 180 tablet 1   magnesium  oxide (MAG-OX) 400 (241.3 Mg) MG tablet Take 1 tablet (400 mg total) by mouth every evening.     ondansetron  (ZOFRAN ) 8 MG tablet Take 1 tablet (8 mg total) by mouth every 8 (eight) hours as needed for nausea or vomiting. 30 tablet 1   oxyCODONE  (OXY IR/ROXICODONE ) 5 MG  immediate release tablet Take 1 tablet (5 mg total) by mouth every 6 (six) hours as needed for severe pain (pain score 7-10). 20 tablet 0   pantoprazole  (PROTONIX ) 40 MG tablet Take 1 tablet (40 mg total) by mouth 2 (two) times daily before a meal. Twice daily for 2 months then once daily thereafter. 60 tablet 11   potassium chloride  (KLOR-CON ) 10 MEQ tablet Take 1 tablet (10 mEq total) by mouth 2 (two) times daily. 60 tablet 1   prochlorperazine  (COMPAZINE ) 10 MG tablet Take 1 tablet (10 mg total) by mouth every 6 (six) hours as needed for nausea or vomiting. 30 tablet 1   simvastatin (ZOCOR) 80 MG tablet Take 80 mg by mouth every evening.     No current facility-administered medications for this visit.   Facility-Administered Medications Ordered in Other Visits  Medication Dose Route Frequency Provider Last Rate Last Admin   0.9 %  sodium chloride  infusion   Intravenous Continuous Lanny Callander, MD 10 mL/hr at 10/02/24 0946 New Bag at 10/02/24 0946   gemcitabine (GEMZAR) 1,216 mg in sodium chloride  0.9 % 250 mL chemo infusion  800 mg/m2 (Treatment Plan Recorded) Intravenous Once Lanny Callander, MD 564 mL/hr at 10/02/24 1120 1,216 mg at 10/02/24 1120   potassium chloride  10 mEq in 100 mL IVPB  10 mEq Intravenous Once Quame Spratlin E, NP        HISTORY OF PRESENT ILLNESS:   Oncology History Overview Note  Cancer Staging Pancreatic cancer [facility]) Staging form: Exocrine Pancreas, AJCC 8th Edition - Clinical stage from 08/24/2020: Stage IIB (cT2, cN1, cM0) - Signed by Lanny Callander, MD on 08/30/2020 Stage prefix: Initial diagnosis    Pancreatic cancer (HCC)  08/20/2020 Imaging   CT AP 08/20/20  IMPRESSION: 1. 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 Collateral circulation is noted. There is moderate intrahepatic and  extrahepatic biliary dilatation which appears to be due to the pancreatic head mass. Pancreatic ductal dilatation is noted as well. 2. Probable 2.7 cm uterine fibroid. 3. Aortic atherosclerosis.   Aortic Atherosclerosis (ICD10-I70.0).   08/20/2020 Tumor Marker   Ca19-9 - 927   08/22/2020 Procedure   ERCP by Dr Rosalie 08/22/20  IMPRESSION - The major papilla appeared normal. - A biliary sphincterotomy was performed. - Cells for cytology obtained in the lower third and middle of the main duct. - One plastic stent was placed into the common bile duct.   FINAL MICROSCOPIC DIAGNOSIS:  - No malignant cells identified  - Benign reactive/reparative changes   08/24/2020 Cancer Staging   Staging form: Exocrine Pancreas, AJCC 8th Edition - Clinical stage from 08/24/2020: Stage IIB (cT2, cN1, cM0) - Signed by Lanny Callander, MD on 08/30/2020   08/24/2020  Procedure   EUS by Dr burnette 08/24/20  IMPRESSION - There was no sign of significant pathology in the ampulla. - A few malignant-appearing lymph nodes were visualized in the peripancreatic region and porta hepatis region. - One stent was visualized endosonographically in the common bile duct. - A mass was identified in the pancreatic head. Tissue was obtained from this exam. The preliminary diagnosis is consistent with adenocarcinoma. Invasion into SMV/PV seen. Lymphadenopathy noted. This was staged T3 N1 Mx by endosonographic criteria. Fine needle aspiration performed.   08/24/2020 Initial Biopsy   FINAL MICROSCOPIC DIAGNOSIS: 08/24/20 - Malignant cells consistent with adenocarcinoma    08/30/2020 Initial Diagnosis   Pancreatic cancer (HCC)   09/05/2020 Imaging   CT Chest  IMPRESSION: 1. Stable 2.7 cm infiltrating pancreatic head mass with borderline enlarged peripancreatic lymph nodes. 2. No findings for pulmonary metastatic disease. 3. Small hiatal hernia. 4. Aortic atherosclerosis.   Aortic Atherosclerosis (ICD10-I70.0).    09/08/2020 Procedure   INSERTION PORT-A-CATH by Dr Dasie and Aron    09/12/2020 - 12/29/2020 Chemotherapy   Neoadjuvant FOLFIRINOX q2 weeks starting 09/12/20-12/29/20    12/02/2020 Imaging   CT CAP  IMPRESSION: 1. Previously noted mass of the central pancreatic head is almost entirely resolved, difficult to discretely appreciate on current examination. Findings are consistent with treatment response. There remains obstruction of the pancreatic duct near the head neck junction with mild prominence of the pancreatic duct, measuring up to 5 mm, with atrophy of the distal pancreatic parenchyma. 2. The portal vein, splenic vein, and superior mesenteric vein are now widely patent, previously effaced at the confluence by mass effect. 3. Interval placement of common bile duct stent, tip positioned in the distal duodenum, with relief of previously seen biliary ductal dilatation. 4. 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 Following the bifurcation of the celiac mesenteric trunk, the celiac axis appears to directly traverse the vicinity of the mass, although there does appear to be a fat plane about the vessel. 5. The distal small bowel and colon are diffusely somewhat hyperenhancing and inflamed appearing with vascular combing and a tethered appearance of the distal small bowel. This appearance generally suggests inflammatory bowel disease such as Crohn's disease. Correlate with referable clinical history, if present. No evidence of obstruction or other acute complication. 6. Hepatic steatosis. 7. Aortic atherosclerosis.   12/28/2020 Genetic Testing   Negative hereditary cancer genetic testing: no pathogenic variants detected in [facility] Common Hereditary Cancers Panel.  The report date is December 28, 2020.    The Common  Hereditary Cancers Panel offered by [facility] includes sequencing and/or deletion duplication testing of the following 47 genes: APC, ATM, AXIN2, BARD1, BMPR1A, BRCA1, BRCA2, BRIP1, CDH1, CDK4, CDKN2A (p14ARF), CDKN2A (p16INK4a), CHEK2, CTNNA1, DICER1, EPCAM (Deletion/duplication testing only), GREM1 (promoter region deletion/duplication testing only), GREM1, HOXB13, KIT, MEN1, MLH1, MSH2, MSH3, MSH6, MUTYH, NBN, NF1, NHTL1, PALB2, PDGFRA, PMS2, POLD1, POLE, PTEN, RAD50, RAD51C, RAD51D, SDHA, SDHB, SDHC, SDHD, SMAD4, SMARCA4. STK11, TP53, TSC1, TSC2, and VHL.  The following genes were evaluated for sequence changes only: SDHA and HOXB13 c.251G>A variant only.   01/17/2021 Imaging   CT [facility] IMPRESSION Small lesion in the pancreatic head measuring approximately 1.1 x 0.8 cm without evidence of vascular involvement or metastasis consistent with previously described, biopsy-proven pancreatic adenocarcinoma.   02/08/2021 Surgery   Whipple Surgery with Dr Abran  Debora  Final Pathologic Diagnosis      A.  GALLBLADDER, CHOLECYSTECTOMY: Chronic cholecystitis. No malignancy identified.   B.  BILE DUCT STENT, REMOVAL (GROSS ONLY DIAGNOSIS): Stent, see gross description.   C.  WHIPPLE RESECTION: No residual malignancy identified. Chronic and acute inflammation with granulation tissue reaction, fibrosis and features of chronic pancreatitis, suggestive of therapy effect. Fourteen lymph nodes, negative for metastasis (0/14). Margins negative for malignancy.        06/07/2021 Imaging   CT AP  IMPRESSION: 1. No current findings of recurrent malignancy. Interval Whipple procedure with expected postoperative findings. 2. A 3.5 cm stent is present in the dorsal pancreatic duct. No duct dilatation. There is an approximately 1.5 mm lucency centrally along this stent shown on image 43 series 7, possibilities include stent fracture, two separate stents, or an intentional radial lucency in this type of  stent. 3. Small type 1 hiatal hernia. Mild distal esophageal wall thickening may reflect low-grade esophagitis. 4.  Aortic Atherosclerosis (ICD10-I70.0). 5.  Prominent stool throughout the colon favors constipation. 6. Uterine fibroid. 7. 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   11/14/2023 - 01/25/2024 Chemotherapy   Patient is on Treatment Plan : PANCREAS NALIRIFOX D1, 15 Q28D     02/20/2024 - 02/20/2024 Chemotherapy   Patient is on Treatment Plan : PANCREATIC Abraxane D1,8,15 + Gemcitabine D1,8,15 q28d     08/21/2024 -  Chemotherapy   Patient is on Treatment Plan : PANCREATIC Abraxane D1,8,15 + Gemcitabine D1,8,15 q28d     Port-A-Cath in place      REVIEW OF SYSTEMS:   Constitutional: Denies fevers, chills or abnormal weight loss Eyes: Denies blurriness of vision Ears, nose, mouth, throat, and face: Denies mucositis or sore throat Respiratory: Denies cough, dyspnea or wheezes Cardiovascular: Denies palpitation or chest discomfort.  She continues to have mild, bilateral, upper extremity swelling.  Slightly more severe on left side than right. Gastrointestinal:  Denies nausea, heartburn or change in bowel habits Skin: Denies abnormal skin rashes Lymphatics: Denies new lymphadenopathy or easy bruising Neurological:Denies numbness, tingling or new weaknesses Behavioral/Psych: Mood is stable, no new changes  All other systems were reviewed with the patient and are negative.   VITALS:   Today's Vitals   10/02/24 0946  BP: 138/68  Pulse: 62  Resp: 17  Temp: 98.2 F (36.8 C)  SpO2: 98%  Weight: 119 lb (54 kg)  Height: 5' 1 (1.549 m)   Body mass index is 22.48 kg/m.    Wt Readings from Last 3 Encounters:  10/02/24 119 lb 4 oz (54.1 kg)  10/02/24 119 lb (54 kg)  09/18/24 127 lb 11.2 oz (57.9 kg)    Body mass index is 22.48 kg/m.  Performance status (ECOG): 1 - Symptomatic but completely ambulatory  PHYSICAL EXAM:    GENERAL:alert, no distress and comfortable SKIN: skin color, texture, turgor are normal, no rashes or significant lesions EYES: normal, Conjunctiva are pink and non-injected, sclera clear OROPHARYNX:no exudate, no erythema and lips, buccal mucosa, and tongue normal  NECK: supple, thyroid normal size, non-tender, without nodularity LYMPH:  no palpable lymphadenopathy in the cervical, axillary or inguinal LUNGS: clear to auscultation and percussion with normal breathing effort HEART: regular rate & rhythm and no murmurs.  There is mild, nonpitting, pedal and ankle edema, bilaterally.  There is slight tenderness with palpation of the medial aspect of the left lower extremity.  There is no redness noted in either lower extremity.  Pedal pulses  are robust bilaterally. ABDOMEN:abdomen soft, non-tender and normal bowel sounds Musculoskeletal:no cyanosis of digits and no clubbing  NEURO: alert & oriented x 3 with fluent speech, no focal motor/sensory deficits  LABORATORY DATA:  I have reviewed the data as listed    Component Value Date/Time   NA 141 10/02/2024 0855   K 3.2 (L) 10/02/2024 0855   CL 107 10/02/2024 0855   CO2 27 10/02/2024 0855   GLUCOSE 124 (H) 10/02/2024 0855   BUN 5 (L) 10/02/2024 0855   CREATININE 1.05 (H) 10/02/2024 0855   CALCIUM  7.8 (L) 10/02/2024 0855   PROT 5.4 (L) 10/02/2024 0855   ALBUMIN 2.7 (L) 10/02/2024 0855   AST 43 (H) 10/02/2024 0855   ALT 21 10/02/2024 0855   ALKPHOS 180 (H) 10/02/2024 0855   BILITOT 0.7 10/02/2024 0855   GFRNONAA 59 (L) 10/02/2024 0855   GFRAA >60 08/23/2020 0606    Lab Results  Component Value Date   WBC 8.0 10/02/2024   NEUTROABS 5.8 10/02/2024   HGB 9.3 (L) 10/02/2024   HCT 28.8 (L) 10/02/2024   MCV 94.4 10/02/2024   PLT 411 (H) 10/02/2024

## 2024-10-02 ENCOUNTER — Telehealth (HOSPITAL_COMMUNITY): Payer: Self-pay

## 2024-10-02 ENCOUNTER — Encounter: Payer: Self-pay | Admitting: Nurse Practitioner

## 2024-10-02 ENCOUNTER — Other Ambulatory Visit: Payer: Self-pay

## 2024-10-02 ENCOUNTER — Inpatient Hospital Stay

## 2024-10-02 ENCOUNTER — Ambulatory Visit (HOSPITAL_COMMUNITY)
Admission: RE | Admit: 2024-10-02 | Discharge: 2024-10-02 | Disposition: A | Discharge status: Home / Self Care | Source: Ambulatory Visit | Attending: Vascular Surgery | Admitting: Vascular Surgery

## 2024-10-02 ENCOUNTER — Telehealth: Payer: Self-pay

## 2024-10-02 ENCOUNTER — Encounter (HOSPITAL_COMMUNITY): Payer: Self-pay

## 2024-10-02 ENCOUNTER — Inpatient Hospital Stay: Attending: Hematology

## 2024-10-02 ENCOUNTER — Inpatient Hospital Stay (HOSPITAL_BASED_OUTPATIENT_CLINIC_OR_DEPARTMENT_OTHER): Admitting: Nurse Practitioner

## 2024-10-02 VITALS — BP 138/68 | HR 62 | Temp 98.2°F | Resp 17 | Ht 61.0 in | Wt 119.0 lb

## 2024-10-02 VITALS — BP 138/68 | HR 62 | Temp 98.2°F | Resp 17 | Ht 61.0 in | Wt 119.2 lb

## 2024-10-02 DIAGNOSIS — C7800 Secondary malignant neoplasm of unspecified lung: Secondary | ICD-10-CM | POA: Diagnosis not present

## 2024-10-02 DIAGNOSIS — Z79899 Other long term (current) drug therapy: Secondary | ICD-10-CM | POA: Diagnosis not present

## 2024-10-02 DIAGNOSIS — Z90411 Acquired partial absence of pancreas: Secondary | ICD-10-CM | POA: Diagnosis not present

## 2024-10-02 DIAGNOSIS — R600 Localized edema: Secondary | ICD-10-CM | POA: Diagnosis not present

## 2024-10-02 DIAGNOSIS — C250 Malignant neoplasm of head of pancreas: Secondary | ICD-10-CM

## 2024-10-02 DIAGNOSIS — M7989 Other specified soft tissue disorders: Secondary | ICD-10-CM

## 2024-10-02 DIAGNOSIS — E876 Hypokalemia: Secondary | ICD-10-CM | POA: Diagnosis not present

## 2024-10-02 DIAGNOSIS — D6481 Anemia due to antineoplastic chemotherapy: Secondary | ICD-10-CM | POA: Insufficient documentation

## 2024-10-02 DIAGNOSIS — T451X5A Adverse effect of antineoplastic and immunosuppressive drugs, initial encounter: Secondary | ICD-10-CM | POA: Diagnosis not present

## 2024-10-02 DIAGNOSIS — Z5111 Encounter for antineoplastic chemotherapy: Secondary | ICD-10-CM | POA: Insufficient documentation

## 2024-10-02 LAB — CMP (CANCER CENTER ONLY)
ALT: 21 U/L (ref 0–44)
AST: 43 U/L — ABNORMAL HIGH (ref 15–41)
Albumin: 2.7 g/dL — ABNORMAL LOW (ref 3.5–5.0)
Alkaline Phosphatase: 180 U/L — ABNORMAL HIGH (ref 38–126)
Anion gap: 7 (ref 5–15)
BUN: 5 mg/dL — ABNORMAL LOW (ref 8–23)
CO2: 27 mmol/L (ref 22–32)
Calcium: 7.8 mg/dL — ABNORMAL LOW (ref 8.9–10.3)
Chloride: 107 mmol/L (ref 98–111)
Creatinine: 1.05 mg/dL — ABNORMAL HIGH (ref 0.44–1.00)
GFR, Estimated: 59 mL/min — ABNORMAL LOW (ref >60)
Glucose, Bld: 124 mg/dL — ABNORMAL HIGH (ref 70–99)
Potassium: 3.2 mmol/L — ABNORMAL LOW (ref 3.5–5.1)
Sodium: 141 mmol/L (ref 135–145)
Total Bilirubin: 0.7 mg/dL (ref 0.0–1.2)
Total Protein: 5.4 g/dL — ABNORMAL LOW (ref 6.5–8.1)

## 2024-10-02 LAB — CBC WITH DIFFERENTIAL (CANCER CENTER ONLY)
Abs Immature Granulocytes: 0.12 K/uL — ABNORMAL HIGH (ref 0.00–0.07)
Basophils Absolute: 0 K/uL (ref 0.0–0.1)
Basophils Relative: 0 %
Eosinophils Absolute: 0.1 K/uL (ref 0.0–0.5)
Eosinophils Relative: 2 %
HCT: 28.8 % — ABNORMAL LOW (ref 36.0–46.0)
Hemoglobin: 9.3 g/dL — ABNORMAL LOW (ref 12.0–15.0)
Immature Granulocytes: 2 %
Lymphocytes Relative: 11 %
Lymphs Abs: 0.9 K/uL (ref 0.7–4.0)
MCH: 30.5 pg (ref 26.0–34.0)
MCHC: 32.3 g/dL (ref 30.0–36.0)
MCV: 94.4 fL (ref 80.0–100.0)
Monocytes Absolute: 1.1 K/uL — ABNORMAL HIGH (ref 0.1–1.0)
Monocytes Relative: 14 %
Neutro Abs: 5.8 K/uL (ref 1.7–7.7)
Neutrophils Relative %: 71 %
Platelet Count: 411 K/uL — ABNORMAL HIGH (ref 150–400)
RBC: 3.05 MIL/uL — ABNORMAL LOW (ref 3.87–5.11)
RDW: 21.9 % — ABNORMAL HIGH (ref 11.5–15.5)
WBC Count: 8 K/uL (ref 4.0–10.5)
nRBC: 0 % (ref 0.0–0.2)

## 2024-10-02 MED ORDER — SODIUM CHLORIDE 0.9 % IV SOLN
800.0000 mg/m2 | Freq: Once | INTRAVENOUS | Status: AC
  Administered 2024-10-02: 1216 mg via INTRAVENOUS
  Filled 2024-10-02: qty 31.98

## 2024-10-02 MED ORDER — PROCHLORPERAZINE MALEATE 10 MG PO TABS
10.0000 mg | ORAL_TABLET | Freq: Once | ORAL | Status: AC
  Administered 2024-10-02: 10 mg via ORAL
  Filled 2024-10-02: qty 1

## 2024-10-02 MED ORDER — POTASSIUM CHLORIDE 10 MEQ/100ML IV SOLN
10.0000 meq | Freq: Once | INTRAVENOUS | Status: AC
  Administered 2024-10-02: 10 meq via INTRAVENOUS
  Filled 2024-10-02: qty 100

## 2024-10-02 MED ORDER — PACLITAXEL PROTEIN-BOUND CHEMO INJECTION 100 MG
150.0000 mg | Freq: Once | INTRAVENOUS | Status: AC
  Administered 2024-10-02: 150 mg via INTRAVENOUS
  Filled 2024-10-02: qty 30

## 2024-10-02 MED ORDER — SODIUM CHLORIDE 0.9 % IV SOLN: INTRAVENOUS | Status: DC

## 2024-10-02 NOTE — Patient Instructions (Signed)
 [facility] - A DEPT OF [facility]  Discharge Instructions: Thank you for choosing [facility] to provide your oncology and hematology care.   If you have a lab appointment with the Cancer Center, please go directly to the Cancer Center and check in at the registration area.   Wear comfortable clothing and clothing appropriate for easy access to any Portacath or PICC line.   We strive to give you quality time with your provider. You may need to reschedule your appointment if you arrive late (15 or more minutes).  Arriving late affects you and other patients whose appointments are after yours.  Also, if you miss three or more appointments without notifying the office, you may be dismissed from the clinic at the provider's discretion.      For prescription refill requests, have your pharmacy contact our office and allow 72 hours for refills to be completed.    Today you received the following chemotherapy and/or immunotherapy agents Abraxane, Gemzar      To help prevent nausea and vomiting after your treatment, we encourage you to take your nausea medication as directed.  BELOW ARE SYMPTOMS THAT SHOULD BE REPORTED IMMEDIATELY: *FEVER GREATER THAN 100.4 F (38 C) OR HIGHER *CHILLS OR SWEATING *NAUSEA AND VOMITING THAT IS NOT CONTROLLED WITH YOUR NAUSEA MEDICATION *UNUSUAL SHORTNESS OF BREATH *UNUSUAL BRUISING OR BLEEDING *URINARY PROBLEMS (pain or burning when urinating, or frequent urination) *BOWEL PROBLEMS (unusual diarrhea, constipation, pain near the anus) TENDERNESS IN MOUTH AND THROAT WITH OR WITHOUT PRESENCE OF ULCERS (sore throat, sores in mouth, or a toothache) UNUSUAL RASH, SWELLING OR PAIN  UNUSUAL VAGINAL DISCHARGE OR ITCHING   Items with * indicate a potential emergency and should be followed up as soon as possible or go to the Emergency Department if any problems should occur.  Please show the CHEMOTHERAPY ALERT CARD or  IMMUNOTHERAPY ALERT CARD at check-in to the Emergency Department and triage nurse.  Should you have questions after your visit or need to cancel or reschedule your appointment, please contact [facility] - A DEPT OF [facility]  Dept: [phone]  and follow the prompts.  Office hours are 8:00 a.m. to 4:30 p.m. Monday - Friday. Please note that voicemails left after 4:00 p.m. may not be returned until the following business day.  We are closed weekends and major holidays. You have access to a nurse at all times for urgent questions. Please call the main number to the clinic Dept: [phone] and follow the prompts.   For any non-urgent questions, you may also contact your provider using MyChart. We now offer e-Visits for anyone 80 and older to request care online for non-urgent symptoms. For details visit [email or web address].   Also download the MyChart app! Go to the app store, search MyChart, open the app, select [facility], and log in with your MyChart username and password.  Hypokalemia Hypokalemia means that the amount of potassium in the blood is lower than normal. Potassium is a mineral (electrolyte) that helps regulate the amount of fluid in the body. It also stimulates muscle tightening (contraction) and helps nerves work properly. Normally, most of the body's potassium is inside cells, and only a very small amount is in the blood. Because the amount in the blood is so small, minor changes to potassium levels in the blood can be life-threatening. What are the causes? This condition may be caused by: Antibiotic  medicine. Diarrhea or vomiting. Taking too much of a medicine that helps you have a bowel movement (laxative) can cause diarrhea and lead to hypokalemia. Chronic kidney disease (CKD). Medicines that help the body get rid of excess fluid (diuretics). Eating disorders, such as anorexia or bulimia. Low magnesium  levels in the body. Sweating a  lot. What are the signs or symptoms? Symptoms of this condition include: Weakness. Constipation. Fatigue. Muscle cramps. Mental confusion. Skipped heartbeats or irregular heartbeat (palpitations). Tingling or numbness. How is this diagnosed? This condition is diagnosed with a blood test. How is this treated? This condition may be treated by: Taking potassium supplements. Adjusting the medicines that you take. Eating more foods that contain a lot of potassium. If your potassium level is very low, you may need to get potassium through an IV and be monitored in the hospital. Follow these instructions at home: Eating and drinking  Eat a healthy diet. A healthy diet includes fresh fruits and vegetables, whole grains, healthy fats, and lean proteins. If told, eat more foods that contain a lot of potassium. These include: Nuts, such as peanuts and pistachios. Seeds, such as sunflower seeds and pumpkin seeds. Peas, lentils, and lima beans. Whole grain and bran cereals and breads. Fresh fruits and vegetables, such as apricots, avocado, bananas, cantaloupe, kiwi, oranges, tomatoes, asparagus, and potatoes. Juices, such as orange, tomato, and prune. Lean meats, including fish. Milk and milk products, such as yogurt. General instructions Take over-the-counter and prescription medicines only as told by your health care provider. This includes vitamins, natural food products, and supplements. Keep all follow-up visits. This is important. Contact a health care provider if: You have weakness that gets worse. You feel your heart pounding or racing. You vomit. You have diarrhea. You have diabetes and you have trouble keeping your blood sugar in your target range. Get help right away if: You have chest pain. You have shortness of breath. You have vomiting or diarrhea that lasts for more than 2 days. You faint. These symptoms may be an emergency. Get help right away. Call 911. Do not wait  to see if the symptoms will go away. Do not drive yourself to the hospital. Summary Hypokalemia means that the amount of potassium in the blood is lower than normal. This condition is diagnosed with a blood test. Hypokalemia may be treated by taking potassium supplements, adjusting the medicines that you take, or eating more foods that are high in potassium. If your potassium level is very low, you may need to get potassium through an IV and be monitored in the hospital. This information is not intended to replace advice given to you by your health care provider. Make sure you discuss any questions you have with your health care provider. Document Revised: 07/20/2021 Document Reviewed: 07/20/2021 Elsevier Patient Education  2024 [employer].

## 2024-10-02 NOTE — Telephone Encounter (Signed)
 Received a staff message from The Nurse and provider from the Cancer center about added on a stat DVT ultrasound for Jacqueline Tyler after her treatment around 1:30. I added the patinent on to the schedule here at [facility] and did Let the CMA know thru secure chat that I was putting the patient on the scheduled for 2:00. But this was a work-in appointment and that the patient would have to wait until the next avaiable Tech could get to her to do the exam . Patient checked in @ 1:46 and had waited for 1 hour  and became upset saying that if it was going to be longer than 10 more min she couldn't wait. Check people did explain to her that she was a work in and someone would get to her.  Patient left office with out having ultrasound done saying she would go somewhere else.

## 2024-10-02 NOTE — Telephone Encounter (Signed)
 Pt checked in at 13:46. At 1436 wanted to know how much longer the wait was going to be, so MP went and spoke with pt, and explained that this is an add on visit and there was going to be a wait involved due to the [place]  techs having a full schedule, and they were fitting her in. Pt expressed aggravation and said that if it was going to be longer than 10 minutes then she was leaving to go to [facility], therefore, MP told pt she would check in the back about the wait. As MP was waiting for [facility]  to finish with speaking with her pt, Mrs. Kubly held the [place]  door as another pt was walking out, saw MP standing at [employer] doorway(waiting), and told MP she was leaving.

## 2024-10-04 ENCOUNTER — Other Ambulatory Visit: Payer: Self-pay

## 2024-10-05 ENCOUNTER — Ambulatory Visit (HOSPITAL_BASED_OUTPATIENT_CLINIC_OR_DEPARTMENT_OTHER)
Admission: RE | Admit: 2024-10-05 | Discharge: 2024-10-05 | Disposition: A | Discharge status: Home / Self Care | Source: Ambulatory Visit | Attending: Hematology | Admitting: Hematology

## 2024-10-05 DIAGNOSIS — R600 Localized edema: Secondary | ICD-10-CM | POA: Insufficient documentation

## 2024-10-06 ENCOUNTER — Other Ambulatory Visit: Payer: Self-pay

## 2024-10-15 NOTE — Assessment & Plan Note (Signed)
 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 Staging was negative for metastatic disease.  -she completed neoadjuvant chemo FOLFIRINOX q2 weeks 09/12/20 - 12/29/20. -s/p whipple surgery by Dr Debora on 02/08/21, path showed no residual disease.  -due to the high recurrence risk of pancreatic cancer, she is under close surveillance.  -Her PET scan from September 30, 2023 showed hypermetabolic soft tissue mass at the previous Whipple surgical site, and a small mildly hypermetabolic nodule in the left lung, concerning for metastatic recurrence. -lung biopsy 10/28/2023 confirmed adenocarcinoma, IHC consistent with metastatic pancreatic ca.  -she started chemo NALIRIFOX  on 11/14/23, had a significant diarrhea, required dose reduction. S/p 6 cycles  -restaging CT 02/03/2024 showed good partial response  -she started consolidation SBRT to lung and pancrease on 03/16/2024 and completed on 03/26/2024 -CT 05/21/2024 showed  treatment response in pancreas and lung nodule, but also showed scattered new left upper lobe pulmonary nodules measure up to 6 mm concerning for progressive pulmonary metastatic disease.  Her tumor marker CA 19.9 dropped down to normal previously and is back to 300's now, concerning for disease progression. She underwent bronchoscopy biopsy which confirmed metastatic recurrence  -she was asymptomatic and reluctant to have chemo again, so we decided to wait for 2-3 months. -repeated CT 07/29/24 showed disease progression in lung and new peritoneal metastasis -I recommend starting chemotherapy again with gemcitabine  and Abraxane  every 2 weeks, due to her previous poor tolerance to chemo. She started on 08/21/2024

## 2024-10-16 ENCOUNTER — Inpatient Hospital Stay: Admitting: Hematology

## 2024-10-16 ENCOUNTER — Inpatient Hospital Stay

## 2024-10-16 VITALS — BP 130/69 | HR 70 | Resp 16

## 2024-10-16 VITALS — BP 113/50 | HR 82 | Temp 97.8°F | Resp 15 | Ht 61.0 in | Wt 113.6 lb

## 2024-10-16 DIAGNOSIS — Z5111 Encounter for antineoplastic chemotherapy: Secondary | ICD-10-CM | POA: Diagnosis not present

## 2024-10-16 DIAGNOSIS — C250 Malignant neoplasm of head of pancreas: Secondary | ICD-10-CM

## 2024-10-16 LAB — CMP (CANCER CENTER ONLY)
ALT: 29 U/L (ref 0–44)
AST: 51 U/L — ABNORMAL HIGH (ref 15–41)
Albumin: 3 g/dL — ABNORMAL LOW (ref 3.5–5.0)
Alkaline Phosphatase: 172 U/L — ABNORMAL HIGH (ref 38–126)
Anion gap: 8 (ref 5–15)
BUN: 8 mg/dL (ref 8–23)
CO2: 28 mmol/L (ref 22–32)
Calcium: 8.2 mg/dL — ABNORMAL LOW (ref 8.9–10.3)
Chloride: 106 mmol/L (ref 98–111)
Creatinine: 1.12 mg/dL — ABNORMAL HIGH (ref 0.44–1.00)
GFR, Estimated: 55 mL/min — ABNORMAL LOW (ref >60)
Glucose, Bld: 121 mg/dL — ABNORMAL HIGH (ref 70–99)
Potassium: 3.7 mmol/L (ref 3.5–5.1)
Sodium: 141 mmol/L (ref 135–145)
Total Bilirubin: 0.5 mg/dL (ref 0.0–1.2)
Total Protein: 6.1 g/dL — ABNORMAL LOW (ref 6.5–8.1)

## 2024-10-16 LAB — CBC WITH DIFFERENTIAL (CANCER CENTER ONLY)
Abs Immature Granulocytes: 0.08 K/uL — ABNORMAL HIGH (ref 0.00–0.07)
Basophils Absolute: 0 K/uL (ref 0.0–0.1)
Basophils Relative: 1 %
Eosinophils Absolute: 0.1 K/uL (ref 0.0–0.5)
Eosinophils Relative: 2 %
HCT: 27.8 % — ABNORMAL LOW (ref 36.0–46.0)
Hemoglobin: 8.9 g/dL — ABNORMAL LOW (ref 12.0–15.0)
Immature Granulocytes: 1 %
Lymphocytes Relative: 14 %
Lymphs Abs: 0.9 K/uL (ref 0.7–4.0)
MCH: 31.3 pg (ref 26.0–34.0)
MCHC: 32 g/dL (ref 30.0–36.0)
MCV: 97.9 fL (ref 80.0–100.0)
Monocytes Absolute: 1.1 K/uL — ABNORMAL HIGH (ref 0.1–1.0)
Monocytes Relative: 16 %
Neutro Abs: 4.4 K/uL (ref 1.7–7.7)
Neutrophils Relative %: 66 %
Platelet Count: 453 K/uL — ABNORMAL HIGH (ref 150–400)
RBC: 2.84 MIL/uL — ABNORMAL LOW (ref 3.87–5.11)
RDW: 22 % — ABNORMAL HIGH (ref 11.5–15.5)
WBC Count: 6.6 K/uL (ref 4.0–10.5)
nRBC: 0 % (ref 0.0–0.2)

## 2024-10-16 MED ORDER — PACLITAXEL PROTEIN-BOUND CHEMO INJECTION 100 MG
97.0000 mg/m2 | Freq: Once | INTRAVENOUS | Status: AC
  Administered 2024-10-16: 150 mg via INTRAVENOUS
  Filled 2024-10-16: qty 30

## 2024-10-16 MED ORDER — SODIUM CHLORIDE 0.9 % IV SOLN
800.0000 mg/m2 | Freq: Once | INTRAVENOUS | Status: AC
  Administered 2024-10-16: 1216 mg via INTRAVENOUS
  Filled 2024-10-16: qty 31.98

## 2024-10-16 MED ORDER — SODIUM CHLORIDE 0.9 % IV SOLN: INTRAVENOUS | Status: DC

## 2024-10-16 MED ORDER — PROCHLORPERAZINE MALEATE 10 MG PO TABS
10.0000 mg | ORAL_TABLET | Freq: Once | ORAL | Status: AC
  Administered 2024-10-16: 10 mg via ORAL
  Filled 2024-10-16: qty 1

## 2024-10-16 NOTE — Progress Notes (Signed)
 [facility]   [phone] [phone]   Clinic Follow up Note   Patient Care Team: [name], DO as PCP - General (Internal Medicine) Lanny Callander, MD as Consulting Physician (Oncology) Dasie Leonor CROME, MD as Consulting Physician (General Surgery)  Date of Service:  10/16/2024  CHIEF COMPLAINT: f/u of pancreatic cancer  CURRENT THERAPY:  Chemotherapy gemcitabine  and Abraxane  every 2 weeks  Oncology History   Pancreatic cancer (HCC) stage IIB, [name], [name], [name] -Diagnosed 08/24/20 on EUS with Dr Burnette, which showed mass at head of pancreas, s/p stenting, with SMV and PV invasion, biopsy confirmed adenocarcinoma with few malignant-appearing LNs in peripancreatic and porta hepatis region. Staging was negative for metastatic disease.  -she completed neoadjuvant chemo FOLFIRINOX q2 weeks 09/12/20 - 12/29/20. -s/p whipple surgery by Dr Debora on 02/08/21, path showed no residual disease.  -due to the high recurrence risk of pancreatic cancer, she is under close surveillance.  -Her PET scan from September 30, 2023 showed hypermetabolic soft tissue mass at the previous Whipple surgical site, and a small mildly hypermetabolic nodule in the left lung, concerning for metastatic recurrence. -lung biopsy 10/28/2023 confirmed adenocarcinoma, IHC consistent with metastatic pancreatic ca.  -she started chemo NALIRIFOX  on 11/14/23, had a significant diarrhea, required dose reduction. S/p 6 cycles  -restaging CT 02/03/2024 showed good partial response  -she started consolidation SBRT to lung and pancrease on 03/16/2024 and completed on 03/26/2024 -CT 05/21/2024 showed  treatment response in pancreas and lung nodule, but also showed scattered new left upper lobe pulmonary nodules measure up to 6 mm concerning for progressive pulmonary metastatic disease.  Her tumor marker CA 19.9 dropped down to normal previously and  is back to 300's now, concerning for disease progression. She underwent bronchoscopy biopsy which confirmed metastatic recurrence  -she was asymptomatic and reluctant to have chemo again, so we decided to wait for 2-3 months. -repeated CT 07/29/24 showed disease progression in lung and new peritoneal metastasis -I recommend starting chemotherapy again with gemcitabine  and Abraxane  every 2 weeks, due to her previous poor tolerance to chemo. She started on 08/21/2024  Assessment & Plan Metastatic pancreatic cancer with lung involvement, on chemotherapy Currently on cycle three, day one of chemotherapy. Tumor marker decreased from 7000 to 2000, indicating a positive response. Chemotherapy is not curative, but local therapy may be considered if available. Goal is to eventually discontinue chemotherapy, but disease progression is likely without local therapy. - Continue chemotherapy every two weeks - Will order CT scan after next cycle, approximately December 15th - Will discuss potential local therapy options after reviewing CT scan results  Anemia secondary to chemotherapy Hemoglobin level is 8.9, slightly lower than previous levels, likely due to chemotherapy. Not low enough to require a blood transfusion. - Continue to monitor hemoglobin levels  Lower extremity edema likely related to medication changes Edema in both legs, left worse than right, likely related to discontinuation of hydrochlorothiazide  (HCTZ). Resumed HCTZ with improvement in edema and weight loss of six pounds. Blood pressure is well-managed on HCTZ. - Contact primary care physician to refill HCTZ prescription - Monitor blood pressure and adjust HCTZ as needed  Hypokalemia under monitoring and supplementation Currently taking oral potassium supplementation, increased to 20 mEq daily. Potassium levels pending, but HCTZ can lower potassium levels. - Continue oral potassium supplementation - Will monitor potassium levels and adjust  supplementation as needed   Plan - She is tolerating chemotherapy well overall,  will continue - Plan to repeat CT scan in 3 weeks - Follow-up in 2 weeks  SUMMARY OF ONCOLOGIC HISTORY: Oncology History Overview Note  Cancer Staging Pancreatic cancer [facility]) Staging form: Exocrine Pancreas, AJCC 8th Edition - Clinical stage from 08/24/2020: Stage IIB (cT2, cN1, cM0) - Signed by Lanny Callander, MD on 08/30/2020 Stage prefix: Initial diagnosis    Pancreatic cancer (HCC)  08/20/2020 Imaging   CT AP 08/20/20  IMPRESSION: 1. 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 Collateral circulation is noted. There is moderate intrahepatic and extrahepatic biliary dilatation which appears to be due to the pancreatic head mass. Pancreatic ductal dilatation is noted as well. 2. Probable 2.7 cm uterine fibroid. 3. Aortic atherosclerosis.   Aortic Atherosclerosis (ICD10-I70.0).   08/20/2020 Tumor Marker   Ca19-9 - 927   08/22/2020 Procedure   ERCP by Dr Rosalie 08/22/20  IMPRESSION - The major papilla appeared normal. - A biliary sphincterotomy was performed. - Cells for cytology obtained in the lower third and middle of the main duct. - One plastic stent was placed into the common bile duct.   FINAL MICROSCOPIC DIAGNOSIS:  - No malignant cells identified  - Benign reactive/reparative changes   08/24/2020 Cancer Staging   Staging form: Exocrine Pancreas, AJCC 8th Edition - Clinical stage from 08/24/2020: Stage IIB (cT2, cN1, cM0) - Signed by Lanny Callander, MD on 08/30/2020   08/24/2020 Procedure   EUS by Dr burnette 08/24/20  IMPRESSION - There was no sign of significant pathology in the ampulla. - A few malignant-appearing lymph nodes were visualized in the peripancreatic region and porta hepatis region. - One stent was  visualized endosonographically in the common bile duct. - A mass was identified in the pancreatic head. Tissue was obtained from this exam. The preliminary diagnosis is consistent with adenocarcinoma. Invasion into SMV/PV seen. Lymphadenopathy noted. This was staged T3 N1 Mx by endosonographic criteria. Fine needle aspiration performed.   08/24/2020 Initial Biopsy   FINAL MICROSCOPIC DIAGNOSIS: 08/24/20 - Malignant cells consistent with adenocarcinoma    08/30/2020 Initial Diagnosis   Pancreatic cancer (HCC)   09/05/2020 Imaging   CT Chest  IMPRESSION: 1. Stable 2.7 cm infiltrating pancreatic head mass with borderline enlarged peripancreatic lymph nodes. 2. No findings for pulmonary metastatic disease. 3. Small hiatal hernia. 4. Aortic atherosclerosis.   Aortic Atherosclerosis (ICD10-I70.0).   09/08/2020 Procedure   INSERTION PORT-A-CATH by Dr Dasie and Aron    09/12/2020 - 12/29/2020 Chemotherapy   Neoadjuvant FOLFIRINOX q2 weeks starting 09/12/20-12/29/20    12/02/2020 Imaging   CT CAP  IMPRESSION: 1. Previously noted mass of the central pancreatic head is almost entirely resolved, difficult to discretely appreciate on current examination. Findings are consistent with treatment response. There remains obstruction of the pancreatic duct near the head neck junction with mild prominence of the pancreatic duct, measuring up to 5 mm, with atrophy of the distal pancreatic parenchyma. 2. The portal vein, splenic vein, and superior mesenteric vein are now widely patent, previously effaced at the confluence by mass effect. 3. Interval placement of common bile duct stent, tip positioned in the distal duodenum, with relief of previously seen biliary ductal dilatation. 4. 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  Following the bifurcation of the celiac mesenteric trunk, the celiac axis appears to directly traverse the vicinity of the mass, although there does appear to be a fat plane about the vessel. 5. The distal small bowel and colon are diffusely somewhat hyperenhancing and inflamed appearing with vascular combing and a tethered appearance of the distal small bowel. This appearance generally suggests inflammatory bowel disease such as Crohn's disease. Correlate with referable clinical history, if present. No evidence of obstruction or other acute complication. 6. Hepatic steatosis. 7. Aortic atherosclerosis.   12/28/2020 Genetic Testing   Negative hereditary cancer genetic testing: no pathogenic variants detected in [employer] Common Hereditary Cancers Panel.  The report date is December 28, 2020.    The Common Hereditary Cancers Panel offered by [employer] includes sequencing and/or deletion duplication testing of the following 47 genes: APC, ATM, AXIN2, BARD1, BMPR1A, BRCA1, BRCA2, BRIP1, CDH1, CDK4, CDKN2A (p14ARF), CDKN2A (p16INK4a), CHEK2, CTNNA1, DICER1, EPCAM (Deletion/duplication testing only), GREM1 (promoter region deletion/duplication testing only), GREM1, HOXB13, KIT, MEN1, MLH1, MSH2, MSH3, MSH6, MUTYH, NBN, NF1, NHTL1, PALB2, PDGFRA, PMS2, POLD1, POLE, PTEN, RAD50, RAD51C, RAD51D, SDHA, SDHB, SDHC, SDHD, SMAD4, SMARCA4. STK11, TP53, TSC1, TSC2, and VHL.  The following genes were evaluated for sequence changes only: SDHA and HOXB13 c.251G>A variant only.   01/17/2021 Imaging   CT CAP from [facility] IMPRESSION Small lesion in the pancreatic head measuring approximately 1.1 x 0.8 cm without evidence of vascular involvement or metastasis consistent with previously described, biopsy-proven pancreatic adenocarcinoma.   02/08/2021 Surgery   Whipple Surgery with Dr Abran Kallman  Final Pathologic Diagnosis      A.  GALLBLADDER, CHOLECYSTECTOMY: Chronic cholecystitis. No malignancy identified.   B.  BILE  DUCT STENT, REMOVAL (GROSS ONLY DIAGNOSIS): Stent, see gross description.   C.  WHIPPLE RESECTION: No residual malignancy identified. Chronic and acute inflammation with granulation tissue reaction, fibrosis and features of chronic pancreatitis, suggestive of therapy effect. Fourteen lymph nodes, negative for metastasis (0/14). Margins negative for malignancy.        06/07/2021 Imaging   CT AP  IMPRESSION: 1. No current findings of recurrent malignancy. Interval Whipple procedure with expected postoperative findings. 2. A 3.5 cm stent is present in the dorsal pancreatic duct. No duct dilatation. There is an approximately 1.5 mm lucency centrally along this stent shown on image 43 series 7, possibilities include stent fracture, two separate stents, or an intentional radial lucency in this type of stent. 3. Small type 1 hiatal hernia. Mild distal esophageal wall thickening may reflect low-grade esophagitis. 4.  Aortic Atherosclerosis (ICD10-I70.0). 5.  Prominent stool throughout the colon favors constipation. 6. Uterine fibroid. 7. 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   11/14/2023 - 01/25/2024 Chemotherapy   Patient is on Treatment Plan : PANCREAS NALIRIFOX D1, 15 Q28D     02/20/2024 - 02/20/2024 Chemotherapy   Patient is on Treatment Plan : PANCREATIC Abraxane  D1,8,15 + Gemcitabine  D1,8,15 q28d     08/21/2024 -  Chemotherapy   Patient is on Treatment Plan : PANCREATIC Abraxane  D1,8,15 + Gemcitabine  D1,8,15 q28d     Port-A-Cath in place     Discussed the use of AI scribe software for clinical note transcription with the patient, who gave verbal consent to proceed.  History of Present Illness YVONNIA TANGO is a 63 year old female with gastric cancer who presents for follow-up.  She is on chemotherapy for gastric cancer, with today as cycle three, day  one. She has no nausea or diarrhea. Her CA 19-9 decreased from 7000 to 2000 over  the past two months.  She has bilateral leg swelling, left greater than right, which started after stopping hydrochlorothiazide  (HCTZ). Ultrasound was done to evaluate for clot. She restarted HCTZ on her own and feels edema and weight have improved, with about six pounds of weight loss.  She takes oral potassium 20 mEq daily, increased from 10 mEq.     All other systems were reviewed with the patient and are negative.  MEDICAL HISTORY:  Past Medical History:  Diagnosis Date   Anemia    Anxiety    Cancer (HCC) 07/2020   pancreatic cancer   Depression    Diabetes mellitus without complication (HCC) 07/2020   pancreatic cancer   High cholesterol    History of blood transfusion    History of hiatal hernia    small   Hypertension    Stroke [facility])    age 19, no residual effect    SURGICAL HISTORY: Past Surgical History:  Procedure Laterality Date   BILIARY BRUSHING  08/22/2020   Procedure: BILIARY BRUSHING;  Surgeon: Rosalie Kitchens, MD;  Location: [facility] ENDOSCOPY;  Service: Endoscopy;;   BILIARY STENT PLACEMENT N/A 08/22/2020   Procedure: BILIARY STENT PLACEMENT;  Surgeon: Rosalie Kitchens, MD;  Location: [facility];  Service: Endoscopy;  Laterality: N/A;   BIOPSY OF SKIN SUBCUTANEOUS TISSUE AND/OR MUCOUS MEMBRANE  02/27/2024   Procedure: BIOPSY, SKIN, SUBCUTANEOUS TISSUE, OR MUCOUS MEMBRANE;  Surgeon: Mansouraty, Aloha Raddle., MD;  Location: [facility] ENDOS[facility];  Service: Gastroenterology;;   BRONCHIAL BIOPSY  10/28/2023   Procedure: BRONCHIAL BIOPSIES;  Surgeon: Shelah Lamar RAMAN, MD;  Location: [facility] ENDOSCOPY;  Service: Pulmonary;;   BRONCHIAL BRUSHINGS  10/28/2023   Procedure: BRONCHIAL BRUSHINGS;  Surgeon: Shelah Lamar RAMAN, MD;  Location: [facility] ENDOSCOPY;  Service: Pulmonary;;   BRONCHIAL NEEDLE ASPIRATION BIOPSY  10/28/2023   Procedure: BRONCHIAL NEEDLE ASPIRATION BIOPSIES;  Surgeon: Shelah Lamar RAMAN, MD;  Location: [facility] ENDOSCOPY;  Service: Pulmonary;;   ENDOSCOPIC RETROGRADE CHOLANGIOPANCREATOGRAPHY  (ERCP) WITH PROPOFOL  N/A 08/22/2020   Procedure: ENDOSCOPIC RETROGRADE CHOLANGIOPANCREATOGRAPHY (ERCP) WITH PROPOFOL ;  Surgeon: Rosalie Kitchens, MD;  Location: [facility];  Service: Endoscopy;  Laterality: N/A;   ESOPHAGOGASTRODUODENOSCOPY (EGD) WITH PROPOFOL  N/A 08/24/2020   Procedure: ESOPHAGOGASTRODUODENOSCOPY (EGD) WITH PROPOFOL ;  Surgeon: Burnette Fallow, MD;  Location: [facility];  Service: Endoscopy;  Laterality: N/A;   EUS N/A 02/27/2024   Procedure: ULTRASOUND, UPPER GI TRACT, ENDOSCOPIC;  Surgeon: Wilhelmenia Aloha Raddle., MD;  Location: [facility];  Service: Gastroenterology;  Laterality: N/A;   FIDUCIAL MARKER PLACEMENT  10/28/2023   Procedure: FIDUCIAL MARKER PLACEMENT;  Surgeon: Shelah Lamar RAMAN, MD;  Location: [facility] ENDOSCOPY;  Service: Pulmonary;;   FIDUCIAL MARKER PLACEMENT  02/27/2024   Procedure: INSERTION, FIDUCIAL MARKERS;  Surgeon: Wilhelmenia Aloha Raddle., MD;  Location: [facility];  Service: Gastroenterology;;   FINE NEEDLE ASPIRATION N/A 08/24/2020   Procedure: FINE NEEDLE ASPIRATION (FNA) LINEAR;  Surgeon: Burnette Fallow, MD;  Location: [facility];  Service: Endoscopy;  Laterality: N/A;   PORT-A-CATH REMOVAL N/A 12/08/2021   Procedure: REMOVAL PORT-A-CATH;  Surgeon: Dasie Leonor CROME, MD;  Location: [facility] OR;  Service: General;  Laterality: N/A;   PORTACATH PLACEMENT Right 09/08/2020   Procedure: INSERTION PORT-A-CATH;  Surgeon: Dasie Leonor CROME, MD;  Location: [facility];  Service: General;  Laterality: Right;   PORTACATH PLACEMENT N/A 11/06/2023   Procedure: INSERTION PORT-A-CATH;  Surgeon: Dasie Leonor CROME, MD;  Location: [facility] ORS;  Service: General;  Laterality: N/A;  LMA   SPHINCTEROTOMY  08/22/2020   Procedure: SPHINCTEROTOMY;  Surgeon: Rosalie Kitchens, MD;  Location: [facility];  Service: Endoscopy;;   UPPER ESOPHAGEAL ENDOSCOPIC ULTRASOUND (EUS) N/A 08/24/2020   Procedure: UPPER ESOPHAGEAL ENDOSCOPIC ULTRASOUND (EUS);  Surgeon: Burnette Fallow, MD;  Location: [facility];  Service: Endoscopy;  Laterality: N/A;    I have reviewed the social history and family history with the patient and they are unchanged from previous note.  ALLERGIES:  is allergic to irinotecan  liposome, tyloxapol, oxycodone -acetaminophen , and poison ivy extract.  MEDICATIONS:  Current Outpatient Medications  Medication Sig Dispense Refill   acetaminophen  (TYLENOL ) 325 MG tablet Take 650 mg by mouth every 6 (six) hours as needed for moderate pain (pain score 4-6).     busPIRone (BUSPAR) 5 MG tablet Take 5 mg by mouth daily.     citalopram  (CELEXA ) 40 MG tablet Take 40 mg by mouth every evening.     CREON  36000-114000 units CPEP capsule TAKE 2 CAPSULES BY MOUTH 3 TIMES A DAY WITH A MEAL. MAY ALSO TAKE 1 CAPSULE AS NEEDED WITH SNACKS 240 capsule 2   hydrochlorothiazide  (HYDRODIURIL ) 12.5 MG tablet Take 1/2 tablet po every day 30 tablet 0   KLOR-CON  M20 20 MEQ tablet TAKE 1 TABLET BY MOUTH TWICE A DAY 180 tablet 1   magnesium  oxide (MAG-OX) 400 (241.3 Mg) MG tablet Take 1 tablet (400 mg total) by mouth every evening.     ondansetron  (ZOFRAN ) 8 MG tablet Take 1 tablet (8 mg total) by mouth every 8 (eight) hours as needed for nausea or vomiting. 30 tablet 1   oxyCODONE  (OXY IR/ROXICODONE ) 5 MG immediate release tablet Take 1 tablet (5 mg total) by mouth every 6 (six) hours as needed for severe pain (pain score 7-10). 20 tablet 0   pantoprazole  (PROTONIX ) 40 MG tablet Take 1 tablet (40 mg total) by mouth 2 (two) times daily before a meal. Twice daily for 2 months then once daily thereafter. 60 tablet 11   potassium chloride  (KLOR-CON ) 10 MEQ tablet Take 1 tablet (10 mEq total) by mouth 2 (two) times daily. 60 tablet 1   prochlorperazine  (COMPAZINE ) 10 MG tablet Take 1 tablet (10 mg total) by mouth every 6 (six) hours as needed for nausea or vomiting. 30 tablet 1   simvastatin (ZOCOR) 80 MG tablet Take 80 mg by mouth every evening.     No current facility-administered medications for  this visit.   Facility-Administered Medications Ordered in Other Visits  Medication Dose Route Frequency Provider Last Rate Last Admin   0.9 %  sodium chloride  infusion   Intravenous Continuous Lanny Callander, MD 10 mL/hr at 10/16/24 0938 New Bag at 10/16/24 0938   gemcitabine  (GEMZAR ) 1,216 mg in sodium chloride  0.9 % 250 mL chemo infusion  800 mg/m2 (Treatment Plan Recorded) Intravenous Once Lanny Callander, MD        PHYSICAL EXAMINATION: ECOG PERFORMANCE STATUS: 1 - Symptomatic but completely ambulatory  Vitals:   10/16/24 0857  BP: (!) 113/50  Pulse: 82  Resp: 15  Temp: 97.8 F (36.6 C)  SpO2: 98%   Wt Readings from Last 3 Encounters:  10/16/24 113 lb 9.6 oz (51.5 kg)  10/02/24 119 lb 4 oz (54.1 kg)  10/02/24 119 lb (54 kg)     GENERAL:alert, no distress and comfortable SKIN: skin color, texture, turgor are normal, no rashes or significant lesions EYES: normal, Conjunctiva are pink and non-injected, sclera clear NECK: supple, thyroid normal  size, non-tender, without nodularity LYMPH:  no palpable lymphadenopathy in the cervical, axillary  LUNGS: clear to auscultation and percussion with normal breathing effort HEART: regular rate & rhythm and no murmurs and no lower extremity edema ABDOMEN:abdomen soft, non-tender and normal bowel sounds Musculoskeletal:no cyanosis of digits and no clubbing  NEURO: alert & oriented x 3 with fluent speech, no focal motor/sensory deficits  Physical Exam    LABORATORY DATA:  I have reviewed the data as listed    Latest Ref Rng & Units 10/16/2024    8:36 AM 10/02/2024    8:55 AM 09/18/2024    7:36 AM  CBC  WBC 4.0 - 10.5 K/uL 6.6  8.0  5.6   Hemoglobin 12.0 - 15.0 g/dL 8.9  9.3  9.2   Hematocrit 36.0 - 46.0 % 27.8  28.8  27.8   Platelets 150 - 400 K/uL 453  411  361         Latest Ref Rng & Units 10/16/2024    8:36 AM 10/02/2024    8:55 AM 09/18/2024    7:36 AM  CMP  Glucose 70 - 99 mg/dL 878  875  79   BUN 8 - 23 mg/dL 8  5  6     Creatinine 0.44 - 1.00 mg/dL 8.87  8.94  9.05   Sodium 135 - 145 mmol/L 141  141  142   Potassium 3.5 - 5.1 mmol/L 3.7  3.2  3.4   Chloride 98 - 111 mmol/L 106  107  110   CO2 22 - 32 mmol/L 28  27  27    Calcium  8.9 - 10.3 mg/dL 8.2  7.8  7.7   Total Protein 6.5 - 8.1 g/dL 6.1  5.4  5.3   Total Bilirubin 0.0 - 1.2 mg/dL 0.5  0.7  0.6   Alkaline Phos 38 - 126 U/L 172  180  135   AST 15 - 41 U/L 51  43  22   ALT 0 - 44 U/L 29  21  16        RADIOGRAPHIC STUDIES: I have personally reviewed the radiological images as listed and agreed with the findings in the report. No results found.    Orders Placed This Encounter  Procedures   CT CHEST ABDOMEN PELVIS W CONTRAST    Standing Status:   Future    Expected Date:   11/03/2024    Expiration Date:   10/16/2025    If indicated for the ordered procedure, I authorize the administration of contrast media per Radiology protocol:   Yes    Does the patient have a contrast media/X-ray dye allergy?:   No    Preferred imaging location?:   [facility]    If indicated for the ordered procedure, I authorize the administration of oral contrast media per Radiology protocol:   Yes   All questions were answered. The patient knows to call the clinic with any problems, questions or concerns. No barriers to learning was detected. The total time spent in the appointment was 30 minutes, including review of chart and various tests results, discussions about plan of care and coordination of care plan     Onita Mattock, MD 10/16/2024

## 2024-10-16 NOTE — Patient Instructions (Signed)
 [facility] - A DEPT OF [facility]  Discharge Instructions: Thank you for choosing [facility] to provide your oncology and hematology care.   If you have a lab appointment with the Cancer Center, please go directly to the Cancer Center and check in at the registration area.   Wear comfortable clothing and clothing appropriate for easy access to any Portacath or PICC line.   We strive to give you quality time with your provider. You may need to reschedule your appointment if you arrive late (15 or more minutes).  Arriving late affects you and other patients whose appointments are after yours.  Also, if you miss three or more appointments without notifying the office, you may be dismissed from the clinic at the provider's discretion.      For prescription refill requests, have your pharmacy contact our office and allow 72 hours for refills to be completed.    Today you received the following chemotherapy and/or immunotherapy agents Abraxane, Gemzar      To help prevent nausea and vomiting after your treatment, we encourage you to take your nausea medication as directed.  BELOW ARE SYMPTOMS THAT SHOULD BE REPORTED IMMEDIATELY: *FEVER GREATER THAN 100.4 F (38 C) OR HIGHER *CHILLS OR SWEATING *NAUSEA AND VOMITING THAT IS NOT CONTROLLED WITH YOUR NAUSEA MEDICATION *UNUSUAL SHORTNESS OF BREATH *UNUSUAL BRUISING OR BLEEDING *URINARY PROBLEMS (pain or burning when urinating, or frequent urination) *BOWEL PROBLEMS (unusual diarrhea, constipation, pain near the anus) TENDERNESS IN MOUTH AND THROAT WITH OR WITHOUT PRESENCE OF ULCERS (sore throat, sores in mouth, or a toothache) UNUSUAL RASH, SWELLING OR PAIN  UNUSUAL VAGINAL DISCHARGE OR ITCHING   Items with * indicate a potential emergency and should be followed up as soon as possible or go to the Emergency Department if any problems should occur.  Please show the CHEMOTHERAPY ALERT CARD or  IMMUNOTHERAPY ALERT CARD at check-in to the Emergency Department and triage nurse.  Should you have questions after your visit or need to cancel or reschedule your appointment, please contact [facility] - A DEPT OF [facility]  Dept: [phone]  and follow the prompts.  Office hours are 8:00 a.m. to 4:30 p.m. Monday - Friday. Please note that voicemails left after 4:00 p.m. may not be returned until the following business day.  We are closed weekends and major holidays. You have access to a nurse at all times for urgent questions. Please call the main number to the clinic Dept: [phone] and follow the prompts.   For any non-urgent questions, you may also contact your provider using MyChart. We now offer e-Visits for anyone 80 and older to request care online for non-urgent symptoms. For details visit [email or web address].   Also download the MyChart app! Go to the app store, search MyChart, open the app, select [facility], and log in with your MyChart username and password.  Hypokalemia Hypokalemia means that the amount of potassium in the blood is lower than normal. Potassium is a mineral (electrolyte) that helps regulate the amount of fluid in the body. It also stimulates muscle tightening (contraction) and helps nerves work properly. Normally, most of the body's potassium is inside cells, and only a very small amount is in the blood. Because the amount in the blood is so small, minor changes to potassium levels in the blood can be life-threatening. What are the causes? This condition may be caused by: Antibiotic  medicine. Diarrhea or vomiting. Taking too much of a medicine that helps you have a bowel movement (laxative) can cause diarrhea and lead to hypokalemia. Chronic kidney disease (CKD). Medicines that help the body get rid of excess fluid (diuretics). Eating disorders, such as anorexia or bulimia. Low magnesium  levels in the body. Sweating a  lot. What are the signs or symptoms? Symptoms of this condition include: Weakness. Constipation. Fatigue. Muscle cramps. Mental confusion. Skipped heartbeats or irregular heartbeat (palpitations). Tingling or numbness. How is this diagnosed? This condition is diagnosed with a blood test. How is this treated? This condition may be treated by: Taking potassium supplements. Adjusting the medicines that you take. Eating more foods that contain a lot of potassium. If your potassium level is very low, you may need to get potassium through an IV and be monitored in the hospital. Follow these instructions at home: Eating and drinking  Eat a healthy diet. A healthy diet includes fresh fruits and vegetables, whole grains, healthy fats, and lean proteins. If told, eat more foods that contain a lot of potassium. These include: Nuts, such as peanuts and pistachios. Seeds, such as sunflower seeds and pumpkin seeds. Peas, lentils, and lima beans. Whole grain and bran cereals and breads. Fresh fruits and vegetables, such as apricots, avocado, bananas, cantaloupe, kiwi, oranges, tomatoes, asparagus, and potatoes. Juices, such as orange, tomato, and prune. Lean meats, including fish. Milk and milk products, such as yogurt. General instructions Take over-the-counter and prescription medicines only as told by your health care provider. This includes vitamins, natural food products, and supplements. Keep all follow-up visits. This is important. Contact a health care provider if: You have weakness that gets worse. You feel your heart pounding or racing. You vomit. You have diarrhea. You have diabetes and you have trouble keeping your blood sugar in your target range. Get help right away if: You have chest pain. You have shortness of breath. You have vomiting or diarrhea that lasts for more than 2 days. You faint. These symptoms may be an emergency. Get help right away. Call 911. Do not wait  to see if the symptoms will go away. Do not drive yourself to the hospital. Summary Hypokalemia means that the amount of potassium in the blood is lower than normal. This condition is diagnosed with a blood test. Hypokalemia may be treated by taking potassium supplements, adjusting the medicines that you take, or eating more foods that are high in potassium. If your potassium level is very low, you may need to get potassium through an IV and be monitored in the hospital. This information is not intended to replace advice given to you by your health care provider. Make sure you discuss any questions you have with your health care provider. Document Revised: 07/20/2021 Document Reviewed: 07/20/2021 Elsevier Patient Education  2024 [employer].

## 2024-10-17 LAB — CANCER ANTIGEN 19-9: CA 19-9: 1953 U/mL — ABNORMAL HIGH (ref 0–35)

## 2024-10-27 ENCOUNTER — Other Ambulatory Visit: Payer: Self-pay

## 2024-10-29 ENCOUNTER — Inpatient Hospital Stay: Attending: Hematology

## 2024-10-29 ENCOUNTER — Inpatient Hospital Stay

## 2024-10-29 ENCOUNTER — Inpatient Hospital Stay (HOSPITAL_BASED_OUTPATIENT_CLINIC_OR_DEPARTMENT_OTHER): Admitting: Hematology

## 2024-10-29 VITALS — BP 141/82 | HR 75 | Temp 98.5°F | Resp 17

## 2024-10-29 VITALS — BP 132/64 | HR 67 | Temp 97.8°F | Resp 17 | Ht 61.0 in | Wt 113.6 lb

## 2024-10-29 DIAGNOSIS — C7801 Secondary malignant neoplasm of right lung: Secondary | ICD-10-CM | POA: Insufficient documentation

## 2024-10-29 DIAGNOSIS — T451X5A Adverse effect of antineoplastic and immunosuppressive drugs, initial encounter: Secondary | ICD-10-CM | POA: Diagnosis not present

## 2024-10-29 DIAGNOSIS — H9190 Unspecified hearing loss, unspecified ear: Secondary | ICD-10-CM | POA: Insufficient documentation

## 2024-10-29 DIAGNOSIS — C250 Malignant neoplasm of head of pancreas: Secondary | ICD-10-CM | POA: Insufficient documentation

## 2024-10-29 DIAGNOSIS — Z79899 Other long term (current) drug therapy: Secondary | ICD-10-CM | POA: Diagnosis not present

## 2024-10-29 DIAGNOSIS — C786 Secondary malignant neoplasm of retroperitoneum and peritoneum: Secondary | ICD-10-CM | POA: Insufficient documentation

## 2024-10-29 DIAGNOSIS — Z5111 Encounter for antineoplastic chemotherapy: Secondary | ICD-10-CM | POA: Insufficient documentation

## 2024-10-29 DIAGNOSIS — C7802 Secondary malignant neoplasm of left lung: Secondary | ICD-10-CM | POA: Insufficient documentation

## 2024-10-29 LAB — CBC WITH DIFFERENTIAL (CANCER CENTER ONLY)
Abs Immature Granulocytes: 0.02 K/uL (ref 0.00–0.07)
Basophils Absolute: 0 K/uL (ref 0.0–0.1)
Basophils Relative: 0 %
Eosinophils Absolute: 0.1 K/uL (ref 0.0–0.5)
Eosinophils Relative: 1 %
HCT: 28.8 % — ABNORMAL LOW (ref 36.0–46.0)
Hemoglobin: 9.3 g/dL — ABNORMAL LOW (ref 12.0–15.0)
Immature Granulocytes: 0 %
Lymphocytes Relative: 15 %
Lymphs Abs: 1 K/uL (ref 0.7–4.0)
MCH: 32 pg (ref 26.0–34.0)
MCHC: 32.3 g/dL (ref 30.0–36.0)
MCV: 99 fL (ref 80.0–100.0)
Monocytes Absolute: 1.2 K/uL — ABNORMAL HIGH (ref 0.1–1.0)
Monocytes Relative: 17 %
Neutro Abs: 4.6 K/uL (ref 1.7–7.7)
Neutrophils Relative %: 67 %
Platelet Count: 297 K/uL (ref 150–400)
RBC: 2.91 MIL/uL — ABNORMAL LOW (ref 3.87–5.11)
RDW: 20.1 % — ABNORMAL HIGH (ref 11.5–15.5)
WBC Count: 6.9 K/uL (ref 4.0–10.5)
nRBC: 0 % (ref 0.0–0.2)

## 2024-10-29 LAB — CMP (CANCER CENTER ONLY)
ALT: 17 U/L (ref 0–44)
AST: 26 U/L (ref 15–41)
Albumin: 3.1 g/dL — ABNORMAL LOW (ref 3.5–5.0)
Alkaline Phosphatase: 136 U/L — ABNORMAL HIGH (ref 38–126)
Anion gap: 7 (ref 5–15)
BUN: 5 mg/dL — ABNORMAL LOW (ref 8–23)
CO2: 26 mmol/L (ref 22–32)
Calcium: 8.2 mg/dL — ABNORMAL LOW (ref 8.9–10.3)
Chloride: 107 mmol/L (ref 98–111)
Creatinine: 1.11 mg/dL — ABNORMAL HIGH (ref 0.44–1.00)
GFR, Estimated: 55 mL/min — ABNORMAL LOW (ref >60)
Glucose, Bld: 110 mg/dL — ABNORMAL HIGH (ref 70–99)
Potassium: 3.9 mmol/L (ref 3.5–5.1)
Sodium: 140 mmol/L (ref 135–145)
Total Bilirubin: 0.6 mg/dL (ref 0.0–1.2)
Total Protein: 6 g/dL — ABNORMAL LOW (ref 6.5–8.1)

## 2024-10-29 MED ORDER — SODIUM CHLORIDE 0.9% FLUSH: 10.0000 mL | INTRAVENOUS | Status: DC | PRN

## 2024-10-29 MED ADMIN — Paclitaxel Protein-Bound Particles For IV Susp 100 MG: 155 mg | INTRAVENOUS | NDC 00781353191

## 2024-10-29 MED ADMIN — Prochlorperazine Maleate Tab 10 MG (Base Equivalent): 10 mg | ORAL | NDC 00904738206

## 2024-10-29 MED ADMIN — GEMCITABINE CHEMO IV INFUSION IN 250 ML: 1216 mg | INTRAVENOUS | NDC 00409018201

## 2024-10-29 MED FILL — Prochlorperazine Maleate Tab 10 MG (Base Equivalent): 10.0000 mg | ORAL | Qty: 1 | Status: AC

## 2024-10-29 MED FILL — Paclitaxel Protein-Bound Particles For IV Susp 100 MG: 100.0000 mg/m2 | INTRAVENOUS | Qty: 31 | Status: AC

## 2024-10-29 MED FILL — Gemcitabine HCl Inj 2 GM/52.6ML (38 MG/ML) (Base Equiv): 800.0000 mg/m2 | INTRAVENOUS | Qty: 31.98 | Status: AC

## 2024-10-29 NOTE — Assessment & Plan Note (Signed)
 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 Staging was negative for metastatic disease.  -she completed neoadjuvant chemo FOLFIRINOX q2 weeks 09/12/20 - 12/29/20. -s/p whipple surgery by Dr Debora on 02/08/21, path showed no residual disease.  -due to the high recurrence risk of pancreatic cancer, she is under close surveillance.  -Her PET scan from September 30, 2023 showed hypermetabolic soft tissue mass at the previous Whipple surgical site, and a small mildly hypermetabolic nodule in the left lung, concerning for metastatic recurrence. -lung biopsy 10/28/2023 confirmed adenocarcinoma, IHC consistent with metastatic pancreatic ca.  -she started chemo NALIRIFOX  on 11/14/23, had a significant diarrhea, required dose reduction. S/p 6 cycles  -restaging CT 02/03/2024 showed good partial response  -she started consolidation SBRT to lung and pancrease on 03/16/2024 and completed on 03/26/2024 -CT 05/21/2024 showed  treatment response in pancreas and lung nodule, but also showed scattered new left upper lobe pulmonary nodules measure up to 6 mm concerning for progressive pulmonary metastatic disease.  Her tumor marker CA 19.9 dropped down to normal previously and is back to 300's now, concerning for disease progression. She underwent bronchoscopy biopsy which confirmed metastatic recurrence  -she was asymptomatic and reluctant to have chemo again, so we decided to wait for 2-3 months. -repeated CT 07/29/24 showed disease progression in lung and new peritoneal metastasis -I recommend starting chemotherapy again with gemcitabine  and Abraxane  every 2 weeks, due to her previous poor tolerance to chemo. She started on 08/21/2024

## 2024-10-29 NOTE — Patient Instructions (Signed)
 [facility] - A DEPT OF [facility]  Discharge Instructions: Thank you for choosing [facility] to provide your oncology and hematology care.   If you have a lab appointment with the Cancer Center, please go directly to the Cancer Center and check in at the registration area.   Wear comfortable clothing and clothing appropriate for easy access to any Portacath or PICC line.   We strive to give you quality time with your provider. You may need to reschedule your appointment if you arrive late (15 or more minutes).  Arriving late affects you and other patients whose appointments are after yours.  Also, if you miss three or more appointments without notifying the office, you may be dismissed from the clinic at the provider's discretion.      For prescription refill requests, have your pharmacy contact our office and allow 72 hours for refills to be completed.    Today you received the following chemotherapy and/or immunotherapy agents: Abraxane, Gemzar      To help prevent nausea and vomiting after your treatment, we encourage you to take your nausea medication as directed.  BELOW ARE SYMPTOMS THAT SHOULD BE REPORTED IMMEDIATELY: *FEVER GREATER THAN 100.4 F (38 C) OR HIGHER *CHILLS OR SWEATING *NAUSEA AND VOMITING THAT IS NOT CONTROLLED WITH YOUR NAUSEA MEDICATION *UNUSUAL SHORTNESS OF BREATH *UNUSUAL BRUISING OR BLEEDING *URINARY PROBLEMS (pain or burning when urinating, or frequent urination) *BOWEL PROBLEMS (unusual diarrhea, constipation, pain near the anus) TENDERNESS IN MOUTH AND THROAT WITH OR WITHOUT PRESENCE OF ULCERS (sore throat, sores in mouth, or a toothache) UNUSUAL RASH, SWELLING OR PAIN  UNUSUAL VAGINAL DISCHARGE OR ITCHING   Items with * indicate a potential emergency and should be followed up as soon as possible or go to the Emergency Department if any problems should occur.  Please show the CHEMOTHERAPY ALERT CARD or  IMMUNOTHERAPY ALERT CARD at check-in to the Emergency Department and triage nurse.  Should you have questions after your visit or need to cancel or reschedule your appointment, please contact [facility] - A DEPT OF [facility]  Dept: [phone]  and follow the prompts.  Office hours are 8:00 a.m. to 4:30 p.m. Monday - Friday. Please note that voicemails left after 4:00 p.m. may not be returned until the following business day.  We are closed weekends and major holidays. You have access to a nurse at all times for urgent questions. Please call the main number to the clinic Dept: [phone] and follow the prompts.   For any non-urgent questions, you may also contact your provider using MyChart. We now offer e-Visits for anyone 73 and older to request care online for non-urgent symptoms. For details visit [email or web address].   Also download the MyChart app! Go to the app store, search "MyChart", open the app, select [facility], and log in with your MyChart username and password.

## 2024-10-29 NOTE — Progress Notes (Signed)
 [facility]   [phone] [phone]   Clinic Follow up Note   Patient Care Team: Jacqueline [name], DO as PCP - General (Internal Medicine) Jacqueline Callander, MD as Consulting Physician (Oncology) Jacqueline Leonor CROME, MD as Consulting Physician (General Surgery)  Date of Service:  10/29/2024  CHIEF COMPLAINT: f/u of metastatic pancreatic cancer  CURRENT THERAPY:  Second line chemotherapy gemcitabine  and Abraxane  every 2 weeks  Oncology History   Pancreatic cancer (HCC)  stage IIB, [name], [name], [name] -Diagnosed 08/24/20 on EUS with Dr Jacqueline, which showed mass at head of pancreas, s/p stenting, with SMV and PV invasion, biopsy confirmed adenocarcinoma with few malignant-appearing LNs in peripancreatic and porta hepatis region. Staging was negative for metastatic disease.  -she completed neoadjuvant chemo FOLFIRINOX q2 weeks 09/12/20 - 12/29/20. -s/p whipple surgery by Dr Debora on 02/08/21, path showed no residual disease.  -due to the high recurrence risk of pancreatic cancer, she is under close surveillance.  -Her PET scan from September 30, 2023 showed hypermetabolic soft tissue mass at the previous Whipple surgical site, and a small mildly hypermetabolic nodule in the left lung, concerning for metastatic recurrence. -lung biopsy 10/28/2023 confirmed adenocarcinoma, IHC consistent with metastatic pancreatic ca.  -she started chemo NALIRIFOX  on 11/14/23, had a significant diarrhea, required dose reduction. S/p 6 cycles  -restaging CT 02/03/2024 showed good partial response  -she started consolidation SBRT to lung and pancrease on 03/16/2024 and completed on 03/26/2024 -CT 05/21/2024 showed  treatment response in pancreas and lung nodule, but also showed scattered new left upper lobe pulmonary nodules measure up to 6 mm concerning for progressive pulmonary metastatic disease.  Her tumor marker CA 19.9 dropped down  to normal previously and is back to 300's now, concerning for disease progression. She underwent bronchoscopy biopsy which confirmed metastatic recurrence  -she was asymptomatic and reluctant to have chemo again, so we decided to wait for 2-3 months. -repeated CT 07/29/24 showed disease progression in lung and new peritoneal metastasis -I recommend starting chemotherapy again with gemcitabine  and Abraxane  every 2 weeks, due to her previous poor tolerance to chemo. She started on 08/21/2024  Assessment & Plan Recurrent pancreatic cancer with peritoneal and lung metastases Metastatic pancreatic cancer involving the peritoneum and lungs. Disease is incurable but treatable. She is receiving systemic therapy, but experiences several days of post-chemotherapy fatigue. Ongoing management is contingent on response to therapy and upcoming imaging. - Review upcoming CT scan to assess disease status and therapeutic response. - Attempt molecular profiling on prior lung biopsy tissue; if inadequate, order blood-based molecular testing (Guardant 360). - Discuss scan results at next follow-up on December 23rd.  Chemotherapy management She has completed five cycles of chemotherapy with expected post-infusion fatigue requiring several days for recovery. No significant acute toxicities or complications. Plan to continue chemotherapy as tolerated and as long as there is clinical benefit. Dose adjustment or holding therapy will be considered if significant toxicity develops. Discontinuation at this stage would likely result in disease progression. - Continue current chemotherapy regimen as tolerated. - Monitor for chemotherapy-related adverse effects, including neurotoxicity and other complications. - Adjust or hold chemotherapy if significant toxicity occurs. - Next chemotherapy session scheduled for January 8th, pending scan results and clinical status.  Chemotherapy-induced anemia Chemotherapy-induced anemia with  hemoglobin 9.3 g/dL. No acute symptoms or complications. - Monitor hemoglobin and other blood counts with routine laboratory studies.  History of hypokalemia Hypokalemia managed with oral potassium supplementation.  Most recent potassium 3.9 mmol/L, within normal limits. No recent severe hypokalemia or need for intravenous supplementation. - Continue oral potassium supplementation at 40 mg twice daily. - Monitor serum potassium with routine laboratory studies. - Encourage adequate oral hydration.  Plan - Lab reviewed, adequate for treatment, will proceed to chemo at the same dose, continue every 2 weeks - She is scheduled for restaging CT scan next week - Follow-up in 2 weeks   SUMMARY OF ONCOLOGIC HISTORY: Oncology History Overview Note  Cancer Staging Pancreatic cancer [facility]) Staging form: Exocrine Pancreas, AJCC 8th Edition - Clinical stage from 08/24/2020: Stage IIB (cT2, cN1, cM0) - Signed by Jacqueline Callander, MD on 08/30/2020 Stage prefix: Initial diagnosis    Pancreatic cancer (HCC)  08/20/2020 Imaging   CT AP 08/20/20  IMPRESSION: 1. 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 Collateral circulation is noted. There is moderate intrahepatic and extrahepatic biliary dilatation which appears to be due to the pancreatic head mass. Pancreatic ductal dilatation is noted as well. 2. Probable 2.7 cm uterine fibroid. 3. Aortic atherosclerosis.   Aortic Atherosclerosis (ICD10-I70.0).   08/20/2020 Tumor Marker   Ca19-9 - 927   08/22/2020 Procedure   ERCP by Dr Jacqueline 08/22/20  IMPRESSION - The major papilla appeared normal. - A biliary sphincterotomy was performed. - Cells for cytology obtained in the lower third and middle of the main duct. - One plastic stent was placed into the common bile duct.   FINAL  MICROSCOPIC DIAGNOSIS:  - No malignant cells identified  - Benign reactive/reparative changes   08/24/2020 Cancer Staging   Staging form: Exocrine Pancreas, AJCC 8th Edition - Clinical stage from 08/24/2020: Stage IIB (cT2, cN1, cM0) - Signed by Jacqueline Callander, MD on 08/30/2020   08/24/2020 Procedure   EUS by Dr Jacqueline 08/24/20  IMPRESSION - There was no sign of significant pathology in the ampulla. - A few malignant-appearing lymph nodes were visualized in the peripancreatic region and porta hepatis region. - One stent was visualized endosonographically in the common bile duct. - A mass was identified in the pancreatic head. Tissue was obtained from this exam. The preliminary diagnosis is consistent with adenocarcinoma. Invasion into SMV/PV seen. Lymphadenopathy noted. This was staged T3 N1 Mx by endosonographic criteria. Fine needle aspiration performed.   08/24/2020 Initial Biopsy   FINAL MICROSCOPIC DIAGNOSIS: 08/24/20 - Malignant cells consistent with adenocarcinoma    08/30/2020 Initial Diagnosis   Pancreatic cancer (HCC)   09/05/2020 Imaging   CT Chest  IMPRESSION: 1. Stable 2.7 cm infiltrating pancreatic head mass with borderline enlarged peripancreatic lymph nodes. 2. No findings for pulmonary metastatic disease. 3. Small hiatal hernia. 4. Aortic atherosclerosis.   Aortic Atherosclerosis (ICD10-I70.0).   09/08/2020 Procedure   INSERTION PORT-A-CATH by Dr Jacqueline and Aron    09/12/2020 - 12/29/2020 Chemotherapy   Neoadjuvant FOLFIRINOX q2 weeks starting 09/12/20-12/29/20    12/02/2020 Imaging   CT CAP  IMPRESSION: 1. Previously noted mass of the central pancreatic head is almost entirely resolved, difficult to discretely appreciate on current examination. Findings are consistent with treatment response. There remains obstruction of the pancreatic duct near the head neck junction with mild prominence of the pancreatic duct, measuring up to 5 mm, with atrophy of the  distal pancreatic parenchyma. 2. The portal vein, splenic vein, and superior mesenteric vein  are now widely patent, previously effaced at the confluence by mass effect. 3. Interval placement of common bile duct stent, tip positioned in the distal duodenum, with relief of previously seen biliary ductal dilatation. 4. 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 Following the bifurcation of the celiac mesenteric trunk, the celiac axis appears to directly traverse the vicinity of the mass, although there does appear to be a fat plane about the vessel. 5. The distal small bowel and colon are diffusely somewhat hyperenhancing and inflamed appearing with vascular combing and a tethered appearance of the distal small bowel. This appearance generally suggests inflammatory bowel disease such as Crohn's disease. Correlate with referable clinical history, if present. No evidence of obstruction or other acute complication. 6. Hepatic steatosis. 7. Aortic atherosclerosis.   12/28/2020 Genetic Testing   Negative hereditary cancer genetic testing: no pathogenic variants detected in [employer] Common Hereditary Cancers Panel.  The report date is December 28, 2020.    The Common Hereditary Cancers Panel offered by [facility] includes sequencing and/or deletion duplication testing of the following 47 genes: APC, ATM, AXIN2, BARD1, BMPR1A, BRCA1, BRCA2, BRIP1, CDH1, CDK4, CDKN2A (p14ARF), CDKN2A (p16INK4a), CHEK2, CTNNA1, DICER1, EPCAM (Deletion/duplication testing only), GREM1 (promoter region deletion/duplication testing only), GREM1, HOXB13, KIT, MEN1, MLH1, MSH2, MSH3, MSH6, MUTYH, NBN, NF1, NHTL1, PALB2, PDGFRA, PMS2, POLD1, POLE, PTEN, RAD50, RAD51C, RAD51D, SDHA, SDHB, SDHC, SDHD, SMAD4, SMARCA4. STK11, TP53, TSC1, TSC2, and VHL.  The following genes  were evaluated for sequence changes only: SDHA and HOXB13 c.251G>A variant only.   01/17/2021 Imaging   CT CAP from [facility] IMPRESSION Small lesion in the pancreatic head measuring approximately 1.1 x 0.8 cm without evidence of vascular involvement or metastasis consistent with previously described, biopsy-proven pancreatic adenocarcinoma.   02/08/2021 Surgery   Whipple Surgery with Dr Jacqueline [facility]  Final Pathologic Diagnosis      A.  GALLBLADDER, CHOLECYSTECTOMY: Chronic cholecystitis. No malignancy identified.   B.  BILE DUCT STENT, REMOVAL (GROSS ONLY DIAGNOSIS): Stent, see gross description.   C.  WHIPPLE RESECTION: No residual malignancy identified. Chronic and acute inflammation with granulation tissue reaction, fibrosis and features of chronic pancreatitis, suggestive of therapy effect. Fourteen lymph nodes, negative for metastasis (0/14). Margins negative for malignancy.        06/07/2021 Imaging   CT AP  IMPRESSION: 1. No current findings of recurrent malignancy. Interval Whipple procedure with expected postoperative findings. 2. A 3.5 cm stent is present in the dorsal pancreatic duct. No duct dilatation. There is an approximately 1.5 mm lucency centrally along this stent shown on image 43 series 7, possibilities include stent fracture, two separate stents, or an intentional radial lucency in this type of stent. 3. Small type 1 hiatal hernia. Mild distal esophageal wall thickening may reflect low-grade esophagitis. 4.  Aortic Atherosclerosis (ICD10-I70.0). 5.  Prominent stool throughout the colon favors constipation. 6. Uterine fibroid. 7. 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   11/14/2023 - 01/25/2024 Chemotherapy   Patient is on Treatment Plan : PANCREAS NALIRIFOX D1, 15 Q28D     02/20/2024 - 02/20/2024 Chemotherapy   Patient is on Treatment Plan : PANCREATIC Abraxane  D1,8,15 + Gemcitabine  D1,8,15 q28d     08/21/2024 -   Chemotherapy   Patient is on Treatment Plan : PANCREATIC Abraxane  D1,8,15 + Gemcitabine  D1,8,15 q28d     Port-A-Cath  in place     Discussed the use of AI scribe software for clinical note transcription with the patient, who gave verbal consent to proceed.  History of Present Illness CLARESSA HUGHLEY is a 63 year old female with recurrent metastatic pancreatic cancer who presents for follow-up during ongoing systemic chemotherapy.  The most recent infusion was two weeks ago, and she has completed five cycles to date. The next cycle is scheduled for January 8th.  She denies acute complications from the most recent chemotherapy. She has fatigue after each infusion with increased sleep and reduced activity for 3-4 days, during which she can ambulate, eat, and perform basic activities but cannot work. She usually returns to work on Tuesday after Thursday or Friday infusions but is unable to work on the Monday immediately after treatment.  She takes oral potassium 40 mg twice daily for hypokalemia identified about one month ago and has not needed intravenous replacement.     All other systems were reviewed with the patient and are negative.  MEDICAL HISTORY:  Past Medical History:  Diagnosis Date   Anemia    Anxiety    Cancer (HCC) 07/2020   pancreatic cancer   Depression    Diabetes mellitus without complication (HCC) 07/2020   pancreatic cancer   High cholesterol    History of blood transfusion    History of hiatal hernia    small   Hypertension    Stroke [facility])    age 15, no residual effect    SURGICAL HISTORY: Past Surgical History:  Procedure Laterality Date   BILIARY BRUSHING  08/22/2020   Procedure: BILIARY BRUSHING;  Surgeon: Jacqueline Kitchens, MD;  Location: [facility] ENDOSCOPY;  Service: Endoscopy;;   BILIARY STENT PLACEMENT N/A 08/22/2020   Procedure: BILIARY STENT PLACEMENT;  Surgeon: Jacqueline Kitchens, MD;  Location: [facility]OS[facility];  Service: Endoscopy;  Laterality: N/A;   BIOPSY OF SKIN  SUBCUTANEOUS TISSUE AND/OR MUCOUS MEMBRANE  02/27/2024   Procedure: BIOPSY, SKIN, SUBCUTANEOUS TISSUE, OR MUCOUS MEMBRANE;  Surgeon: Jacqueline [facility], Jacqueline Raddle., MD;  Location: [facility] ENDOSCOPY;  Service: Gastroenterology;;   BRONCHIAL BIOPSY  10/28/2023   Procedure: BRONCHIAL BIOPSIES;  Surgeon: Jacqueline Lamar RAMAN, MD;  Location: [facility] ENDOSCOPY;  Service: Pulmonary;;   BRONCHIAL BRUSHINGS  10/28/2023   Procedure: BRONCHIAL BRUSHINGS;  Surgeon: Jacqueline Lamar RAMAN, MD;  Location: [facility] ENDOSCOPY;  Service: Pulmonary;;   BRONCHIAL NEEDLE ASPIRATION BIOPSY  10/28/2023   Procedure: BRONCHIAL NEEDLE ASPIRATION BIOPSIES;  Surgeon: Jacqueline Lamar RAMAN, MD;  Location: [facility] ENDOSCOPY;  Service: Pulmonary;;   ENDOSCOPIC RETROGRADE CHOLANGIOPANCREATOGRAPHY (ERCP) WITH PROPOFOL  N/A 08/22/2020   Procedure: ENDOSCOPIC RETROGRADE CHOLANGIOPANCREATOGRAPHY (ERCP) WITH PROPOFOL ;  Surgeon: Jacqueline Kitchens, MD;  Location: [facility];  Service: Endoscopy;  Laterality: N/A;   ESOPHAGOGASTRODUODENOSCOPY (EGD) WITH PROPOFOL  N/A 08/24/2020   Procedure: ESOPHAGOGASTRODUODENOSCOPY (EGD) WITH PROPOFOL ;  Surgeon: Jacqueline Fallow, MD;  Location: [facility];  Service: Endoscopy;  Laterality: N/A;   EUS N/A 02/27/2024   Procedure: ULTRASOUND, UPPER GI TRACT, ENDOSCOPIC;  Surgeon: Wilhelmenia Jacqueline Raddle., MD;  Location: [facility];  Service: Gastroenterology;  Laterality: N/A;   FIDUCIAL MARKER PLACEMENT  10/28/2023   Procedure: FIDUCIAL MARKER PLACEMENT;  Surgeon: Jacqueline Lamar RAMAN, MD;  Location: [facility] ENDOSCOPY;  Service: Pulmonary;;   FIDUCIAL MARKER PLACEMENT  02/27/2024   Procedure: INSERTION, FIDUCIAL MARKERS;  Surgeon: Wilhelmenia Jacqueline Raddle., MD;  Location: [facility];  Service: Gastroenterology;;   FINE NEEDLE ASPIRATION N/A 08/24/2020   Procedure: FINE NEEDLE ASPIRATION (FNA) LINEAR;  Surgeon: Jacqueline Fallow, MD;  Location: [facility];  Service: Endoscopy;  Laterality: N/A;   PORT-A-CATH REMOVAL N/A 12/08/2021   Procedure: REMOVAL PORT-A-CATH;   Surgeon: Jacqueline Leonor CROME, MD;  Location[facility] OR;  Service: General;  Laterality: N/A;   PORTACATH PLACEMENT Right 09/08/2020   Procedure: INSERTION PORT-A-CATH;  Surgeon: Jacqueline Leonor CROME, MD;  Location: [facility];  Service: General;  Laterality: Right;   PORTACATH PLACEMENT N/A 11/06/2023   Procedure: INSERTION PORT-A-CATH;  Surgeon: Jacqueline Leonor CROME, MD;  Location: [facility]S;  Service: General;  Laterality: N/A;  LMA   SPHINCTEROTOMY  08/22/2020   Procedure: ANNETT;  Surgeon: Jacqueline Kitchens, MD;  Location: [facility];  Service: Endoscopy;;   UPPER ESOPHAGEAL ENDOSCOPIC ULTRASOUND (EUS) N/A 08/24/2020   Procedure: UPPER ESOPHAGEAL ENDOSCOPIC ULTRASOUND (EUS);  Surgeon: Jacqueline Fallow, MD;  Location: [facility];  Service: Endoscopy;  Laterality: N/A;    I have reviewed the social history and family history with the patient and they are unchanged from previous note.  ALLERGIES:  is allergic to irinotecan  liposome, tyloxapol, oxycodone -acetaminophen , and poison ivy extract.  MEDICATIONS:  Current Outpatient Medications  Medication Sig Dispense Refill   acetaminophen  (TYLENOL ) 325 MG tablet Take 650 mg by mouth every 6 (six) hours as needed for moderate pain (pain score 4-6).     busPIRone (BUSPAR) 5 MG tablet Take 5 mg by mouth daily.     citalopram  (CELEXA ) 40 MG tablet Take 40 mg by mouth every evening.     CREON  36000-114000 units CPEP capsule TAKE 2 CAPSULES BY MOUTH 3 TIMES A DAY WITH A MEAL. MAY ALSO TAKE 1 CAPSULE AS NEEDED WITH SNACKS 240 capsule 2   hydrochlorothiazide  (HYDRODIURIL ) 12.5 MG tablet Take 1/2 tablet po every day 30 tablet 0   KLOR-CON  M20 20 MEQ tablet TAKE 1 TABLET BY MOUTH TWICE A DAY 180 tablet 1   magnesium  oxide (MAG-OX) 400 (241.3 Mg) MG tablet Take 1 tablet (400 mg total) by mouth every evening.     ondansetron  (ZOFRAN ) 8 MG tablet Take 1 tablet (8 mg total) by mouth every 8 (eight) hours as needed for nausea or vomiting. 30 tablet 1   oxyCODONE   (OXY IR/ROXICODONE ) 5 MG immediate release tablet Take 1 tablet (5 mg total) by mouth every 6 (six) hours as needed for severe pain (pain score 7-10). 20 tablet 0   pantoprazole  (PROTONIX ) 40 MG tablet Take 1 tablet (40 mg total) by mouth 2 (two) times daily before a meal. Twice daily for 2 months then once daily thereafter. 60 tablet 11   potassium chloride  (KLOR-CON ) 10 MEQ tablet Take 1 tablet (10 mEq total) by mouth 2 (two) times daily. 60 tablet 1   prochlorperazine  (COMPAZINE ) 10 MG tablet Take 1 tablet (10 mg total) by mouth every 6 (six) hours as needed for nausea or vomiting. 30 tablet 1   simvastatin (ZOCOR) 80 MG tablet Take 80 mg by mouth every evening.     No current facility-administered medications for this visit.   Facility-Administered Medications Ordered in Other Visits  Medication Dose Route Frequency Provider Last Rate Last Admin   0.9 %  sodium chloride  infusion   Intravenous Continuous Jacqueline Callander, MD 10 mL/hr at 10/29/24 1231 New Bag at 10/29/24 1231   sodium chloride  flush (NS) 0.9 % injection 10 mL  10 mL Intracatheter PRN Jacqueline Callander, MD        PHYSICAL EXAMINATION: ECOG PERFORMANCE STATUS: 1 - Symptomatic but completely ambulatory  Vitals:   10/29/24 1125  BP: 132/64  Pulse: 67  Resp: 17  Temp: 97.8 F (36.6 C)  SpO2: 100%   Wt Readings from Last 3 Encounters:  10/29/24 113 lb 9.6 oz (51.5 kg)  10/16/24 113 lb 9.6 oz (51.5 kg)  10/02/24 119 lb 4 oz (54.1 kg)     GENERAL:alert, no distress and comfortable SKIN: skin color, texture, turgor are normal, no rashes or significant lesions EYES: normal, Conjunctiva are pink and non-injected, sclera clear NECK: supple, thyroid normal size, non-tender, without nodularity LYMPH:  no palpable lymphadenopathy in the cervical, axillary  LUNGS: clear to auscultation and percussion with normal breathing effort HEART: regular rate & rhythm and no murmurs and no lower extremity edema ABDOMEN:abdomen soft, non-tender and  normal bowel sounds Musculoskeletal:no cyanosis of digits and no clubbing  NEURO: alert & oriented x 3 with fluent speech, no focal motor/sensory deficits  Physical Exam    LABORATORY DATA:  I have reviewed the data as listed    Latest Ref Rng & Units 10/29/2024   11:07 AM 10/16/2024    8:36 AM 10/02/2024    8:55 AM  CBC  WBC 4.0 - 10.5 K/uL 6.9  6.6  8.0   Hemoglobin 12.0 - 15.0 g/dL 9.3  8.9  9.3   Hematocrit 36.0 - 46.0 % 28.8  27.8  28.8   Platelets 150 - 400 K/uL 297  453  411         Latest Ref Rng & Units 10/29/2024   11:07 AM 10/16/2024    8:36 AM 10/02/2024    8:55 AM  CMP  Glucose 70 - 99 mg/dL 889  878  875   BUN 8 - 23 mg/dL 5  8  5    Creatinine 0.44 - 1.00 mg/dL 8.88  8.87  8.94   Sodium 135 - 145 mmol/L 140  141  141   Potassium 3.5 - 5.1 mmol/L 3.9  3.7  3.2   Chloride 98 - 111 mmol/L 107  106  107   CO2 22 - 32 mmol/L 26  28  27    Calcium  8.9 - 10.3 mg/dL 8.2  8.2  7.8   Total Protein 6.5 - 8.1 g/dL 6.0  6.1  5.4   Total Bilirubin 0.0 - 1.2 mg/dL 0.6  0.5  0.7   Alkaline Phos 38 - 126 U/L 136  172  180   AST 15 - 41 U/L 26  51  43   ALT 0 - 44 U/L 17  29  21        RADIOGRAPHIC STUDIES: I have personally reviewed the radiological images as listed and agreed with the findings in the report. No results found.    Orders Placed This Encounter  Procedures   CBC with Differential ([facility] Only)    Standing Status:   Future    Expected Date:   12/11/2024    Expiration Date:   12/11/2025   CMP ([facility] only)    Standing Status:   Future    Expected Date:   12/11/2024    Expiration Date:   12/11/2025   CBC with Differential ([facility] Only)    Standing Status:   Future    Expected Date:   12/25/2024    Expiration Date:   12/25/2025   CMP ([facility] only)    Standing Status:   Future    Expected Date:   12/25/2024    Expiration Date:   12/25/2025   All questions were answered. The patient knows to call the clinic with any problems,  questions or concerns. No barriers to learning was detected. The total time spent in the appointment was 25 minutes, including review of chart and various tests results, discussions about plan of care and coordination of care plan     Onita Mattock, MD 10/29/2024

## 2024-11-04 ENCOUNTER — Ambulatory Visit (HOSPITAL_COMMUNITY): Admission: RE | Admit: 2024-11-04 | Discharge: 2024-11-04 | Attending: Hematology

## 2024-11-04 DIAGNOSIS — C250 Malignant neoplasm of head of pancreas: Secondary | ICD-10-CM | POA: Diagnosis present

## 2024-11-04 MED ORDER — IOHEXOL 300 MG/ML  SOLN
100.0000 mL | Freq: Once | INTRAMUSCULAR | Status: AC | PRN
  Administered 2024-11-04: 14:00:00 100 mL via INTRAVENOUS

## 2024-11-04 MED ORDER — HEPARIN SOD (PORK) LOCK FLUSH 100 UNIT/ML IV SOLN
INTRAVENOUS | Status: AC
  Filled 2024-11-04: qty 5

## 2024-11-04 MED ORDER — IOHEXOL 9 MG/ML PO SOLN
500.0000 mL | ORAL | Status: AC
  Administered 2024-11-04 (×2): 500 mL via ORAL

## 2024-11-04 MED ORDER — HEPARIN SOD (PORK) LOCK FLUSH 100 UNIT/ML IV SOLN
500.0000 [IU] | Freq: Once | INTRAVENOUS | Status: AC
  Administered 2024-11-04: 14:00:00 500 [IU] via INTRAVENOUS

## 2024-11-09 ENCOUNTER — Other Ambulatory Visit: Payer: Self-pay | Admitting: Nurse Practitioner

## 2024-11-10 ENCOUNTER — Inpatient Hospital Stay

## 2024-11-10 ENCOUNTER — Encounter: Payer: Self-pay | Admitting: Hematology

## 2024-11-10 ENCOUNTER — Inpatient Hospital Stay: Admitting: Hematology

## 2024-11-10 VITALS — BP 129/68 | HR 70 | Temp 98.0°F | Resp 15 | Ht 61.0 in | Wt 114.1 lb

## 2024-11-10 DIAGNOSIS — C250 Malignant neoplasm of head of pancreas: Secondary | ICD-10-CM | POA: Diagnosis not present

## 2024-11-10 DIAGNOSIS — Z5111 Encounter for antineoplastic chemotherapy: Secondary | ICD-10-CM | POA: Diagnosis not present

## 2024-11-10 LAB — CMP (CANCER CENTER ONLY)
ALT: 12 U/L (ref 0–44)
AST: 21 U/L (ref 15–41)
Albumin: 3.2 g/dL — ABNORMAL LOW (ref 3.5–5.0)
Alkaline Phosphatase: 102 U/L (ref 38–126)
Anion gap: 8 (ref 5–15)
BUN: 10 mg/dL (ref 8–23)
CO2: 27 mmol/L (ref 22–32)
Calcium: 8.3 mg/dL — ABNORMAL LOW (ref 8.9–10.3)
Chloride: 107 mmol/L (ref 98–111)
Creatinine: 0.9 mg/dL (ref 0.44–1.00)
GFR, Estimated: >60 mL/min
Glucose, Bld: 84 mg/dL (ref 70–99)
Potassium: 4.1 mmol/L (ref 3.5–5.1)
Sodium: 141 mmol/L (ref 135–145)
Total Bilirubin: 0.4 mg/dL (ref 0.0–1.2)
Total Protein: 6.1 g/dL — ABNORMAL LOW (ref 6.5–8.1)

## 2024-11-10 LAB — CBC WITH DIFFERENTIAL (CANCER CENTER ONLY)
Abs Immature Granulocytes: 0.07 K/uL (ref 0.00–0.07)
Basophils Absolute: 0 K/uL (ref 0.0–0.1)
Basophils Relative: 0 %
Eosinophils Absolute: 0.1 K/uL (ref 0.0–0.5)
Eosinophils Relative: 1 %
HCT: 27.3 % — ABNORMAL LOW (ref 36.0–46.0)
Hemoglobin: 8.9 g/dL — ABNORMAL LOW (ref 12.0–15.0)
Immature Granulocytes: 1 %
Lymphocytes Relative: 15 %
Lymphs Abs: 0.9 K/uL (ref 0.7–4.0)
MCH: 32.4 pg (ref 26.0–34.0)
MCHC: 32.6 g/dL (ref 30.0–36.0)
MCV: 99.3 fL (ref 80.0–100.0)
Monocytes Absolute: 1 K/uL (ref 0.1–1.0)
Monocytes Relative: 17 %
Neutro Abs: 3.9 K/uL (ref 1.7–7.7)
Neutrophils Relative %: 66 %
Platelet Count: 301 K/uL (ref 150–400)
RBC: 2.75 MIL/uL — ABNORMAL LOW (ref 3.87–5.11)
RDW: 18 % — ABNORMAL HIGH (ref 11.5–15.5)
WBC Count: 5.9 K/uL (ref 4.0–10.5)
nRBC: 0 % (ref 0.0–0.2)

## 2024-11-10 NOTE — Assessment & Plan Note (Signed)
" °  stage IIB, [name], [name], [name] -Diagnosed 08/24/20 on EUS with Dr Burnette, which showed mass at head of pancreas, s/p stenting, with SMV and PV invasion, biopsy confirmed adenocarcinoma with few malignant-appearing LNs in peripancreatic and porta hepatis region. Staging was negative for metastatic disease.  -she completed neoadjuvant chemo FOLFIRINOX q2 weeks 09/12/20 - 12/29/20. -s/p whipple surgery by Dr Debora on 02/08/21, path showed no residual disease.  -due to the high recurrence risk of pancreatic cancer, she is under close surveillance.  -Her PET scan from September 30, 2023 showed hypermetabolic soft tissue mass at the previous Whipple surgical site, and a small mildly hypermetabolic nodule in the left lung, concerning for metastatic recurrence. -lung biopsy 10/28/2023 confirmed adenocarcinoma, IHC consistent with metastatic pancreatic ca.  -she started chemo NALIRIFOX  on 11/14/23, had a significant diarrhea, required dose reduction. S/p 6 cycles  -restaging CT 02/03/2024 showed good partial response  -she started consolidation SBRT to lung and pancrease on 03/16/2024 and completed on 03/26/2024 -CT 05/21/2024 showed  treatment response in pancreas and lung nodule, but also showed scattered new left upper lobe pulmonary nodules measure up to 6 mm concerning for progressive pulmonary metastatic disease.  Her tumor marker CA 19.9 dropped down to normal previously and is back to 300's now, concerning for disease progression.  -she was asymptomatic and reluctant to have chemo again, so we decided to wait for 2-3 months. -repeated CT 07/29/24 showed disease progression in lung and new peritoneal metastasis -I recommend starting chemotherapy again with gemcitabine  and Abraxane  every 2 weeks, due to her previous poor tolerance to chemo. She started on 08/21/2024 -CT 11/04/2024 showed cancer progression in the peritoneum -Will change chemotherapy to FOLFOX  or 5-FU and liposomal irinotecan  "

## 2024-11-10 NOTE — Progress Notes (Signed)
 " [facility]   [phone] [phone]   Clinic Follow up Note   Patient Care Team: [name], DO as PCP - General (Internal Medicine) Lanny Callander, MD as Consulting Physician (Oncology) Dasie Leonor CROME, MD as Consulting Physician (General Surgery)  Date of Service:  11/10/2024  CHIEF COMPLAINT: f/u of pancreatic cancer   CURRENT THERAPY:  Gemcitabine  and Abraxane    Oncology History   Pancreatic cancer (HCC)  stage IIB, [name], [name], [name] -Diagnosed 08/24/20 on EUS with Dr Burnette, which showed mass at head of pancreas, s/p stenting, with SMV and PV invasion, biopsy confirmed adenocarcinoma with few malignant-appearing LNs in peripancreatic and porta hepatis region. Staging was negative for metastatic disease.  -she completed neoadjuvant chemo FOLFIRINOX q2 weeks 09/12/20 - 12/29/20. -s/p whipple surgery by Dr Debora on 02/08/21, path showed no residual disease.  -due to the high recurrence risk of pancreatic cancer, she is under close surveillance.  -Her PET scan from September 30, 2023 showed hypermetabolic soft tissue mass at the previous Whipple surgical site, and a small mildly hypermetabolic nodule in the left lung, concerning for metastatic recurrence. -lung biopsy 10/28/2023 confirmed adenocarcinoma, IHC consistent with metastatic pancreatic ca.  -she started chemo NALIRIFOX  on 11/14/23, had a significant diarrhea, required dose reduction. S/p 6 cycles  -restaging CT 02/03/2024 showed good partial response  -she started consolidation SBRT to lung and pancrease on 03/16/2024 and completed on 03/26/2024 -CT 05/21/2024 showed  treatment response in pancreas and lung nodule, but also showed scattered new left upper lobe pulmonary nodules measure up to 6 mm concerning for progressive pulmonary metastatic disease.  Her tumor marker CA 19.9 dropped down to normal previously and is back to 300's now,  concerning for disease progression.  -she was asymptomatic and reluctant to have chemo again, so we decided to wait for 2-3 months. -repeated CT 07/29/24 showed disease progression in lung and new peritoneal metastasis -I recommend starting chemotherapy again with gemcitabine  and Abraxane  every 2 weeks, due to her previous poor tolerance to chemo. She started on 08/21/2024 -CT 11/04/2024 showed mild cancer progression in the peritoneum  Assessment & Plan Metastatic pancreatic adenocarcinoma Recent imaging demonstrates mild progression of peritoneal metastases, while the primary pancreatic lesion and pulmonary metastases remain stable. CA 19-9 has decreased significantly, which is discordant with imaging findings. She remains clinically stable, with no new symptoms, stable weight, and improved tolerability of the current chemotherapy regimen. Fatigue and increased somnolence for two to three days post-infusion persist, but she denies significant gastrointestinal toxicity. Ongoing surveillance is warranted, and future treatment modifications will depend on evidence of further progression. - Continued current chemotherapy regimen for two additional months. - Ordered follow-up CT scan in two months to reassess disease status. - Monitored CA 19-9 tumor marker closely. - Instructed her to report new symptoms such as abdominal pain or bloating. - Discussed potential future switch to a regimen including 5-fluorouracil  (5FU) pump infusion and irinotecan  if progression is observed, noting this would require home infusion and may be less desirable due to prior intolerance of three-drug regimens. - Scheduled chemotherapy treatments with preference for Friday appointments to accommodate work schedule. - Instructed her to coordinate with scheduling staff for appointment adjustments as needed.  Chemotherapy-induced hearing loss She reports progressive hearing loss, requiring increased television volume and  difficulty hearing unless directly facing the speaker. Although some chemotherapeutic agents are ototoxic, her current regimen is not commonly associated with hearing loss.  Plan - Restaging CT scan reviewed, which showed a mild progression on peritoneal metastasis. However her tumor marker CA 19.9 has significantly decreased, doing well.  Plan to continue current regiment and repeat CT scan in 2 months. - She will proceed next cycle chemotherapy later this week, follow-up in 2 to 3 weeks    SUMMARY OF ONCOLOGIC HISTORY: Oncology History Overview Note  Cancer Staging Pancreatic cancer [facility]) Staging form: Exocrine Pancreas, AJCC 8th Edition - Clinical stage from 08/24/2020: Stage IIB (cT2, cN1, cM0) - Signed by Lanny Callander, MD on 08/30/2020 Stage prefix: Initial diagnosis    Pancreatic cancer (HCC)  08/20/2020 Imaging   CT AP 08/20/20  IMPRESSION: 1. 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 Collateral circulation is noted. There is moderate intrahepatic and extrahepatic biliary dilatation which appears to be due to the pancreatic head mass. Pancreatic ductal dilatation is noted as well. 2. Probable 2.7 cm uterine fibroid. 3. Aortic atherosclerosis.   Aortic Atherosclerosis (ICD10-I70.0).   08/20/2020 Tumor Marker   Ca19-9 - 927   08/22/2020 Procedure   ERCP by Dr Rosalie 08/22/20  IMPRESSION - The major papilla appeared normal. - A biliary sphincterotomy was performed. - Cells for cytology obtained in the lower third and middle of the main duct. - One plastic stent was placed into the common bile duct.   FINAL MICROSCOPIC DIAGNOSIS:  - No malignant cells identified  - Benign reactive/reparative changes   08/24/2020 Cancer Staging   Staging form: Exocrine Pancreas, AJCC 8th Edition - Clinical  stage from 08/24/2020: Stage IIB (cT2, cN1, cM0) - Signed by Lanny Callander, MD on 08/30/2020   08/24/2020 Procedure   EUS by Dr burnette 08/24/20  IMPRESSION - There was no sign of significant pathology in the ampulla. - A few malignant-appearing lymph nodes were visualized in the peripancreatic region and porta hepatis region. - One stent was visualized endosonographically in the common bile duct. - A mass was identified in the pancreatic head. Tissue was obtained from this exam. The preliminary diagnosis is consistent with adenocarcinoma. Invasion into SMV/PV seen. Lymphadenopathy noted. This was staged T3 N1 Mx by endosonographic criteria. Fine needle aspiration performed.   08/24/2020 Initial Biopsy   FINAL MICROSCOPIC DIAGNOSIS: 08/24/20 - Malignant cells consistent with adenocarcinoma    08/30/2020 Initial Diagnosis   Pancreatic cancer (HCC)   09/05/2020 Imaging   CT Chest  IMPRESSION: 1. Stable 2.7 cm infiltrating pancreatic head mass with borderline enlarged peripancreatic lymph nodes. 2. No findings for pulmonary metastatic disease. 3. Small hiatal hernia. 4. Aortic atherosclerosis.   Aortic Atherosclerosis (ICD10-I70.0).   09/08/2020 Procedure   INSERTION PORT-A-CATH by Dr Dasie and Aron    09/12/2020 - 12/29/2020 Chemotherapy   Neoadjuvant FOLFIRINOX q2 weeks starting 09/12/20-12/29/20    12/02/2020 Imaging   CT CAP  IMPRESSION: 1. Previously noted mass of the central pancreatic head is almost entirely resolved, difficult to discretely appreciate on current examination. Findings are consistent with treatment response. There remains obstruction of the pancreatic duct near the head neck junction with mild prominence of the pancreatic duct, measuring up to 5 mm, with atrophy of the distal pancreatic parenchyma. 2. The portal vein, splenic vein, and superior mesenteric vein are now widely patent, previously effaced at the confluence by mass effect. 3. Interval placement  of common bile duct stent,  tip positioned in the distal duodenum, with relief of previously seen biliary ductal dilatation. 4. 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 Following the bifurcation of the celiac mesenteric trunk, the celiac axis appears to directly traverse the vicinity of the mass, although there does appear to be a fat plane about the vessel. 5. The distal small bowel and colon are diffusely somewhat hyperenhancing and inflamed appearing with vascular combing and a tethered appearance of the distal small bowel. This appearance generally suggests inflammatory bowel disease such as Crohn's disease. Correlate with referable clinical history, if present. No evidence of obstruction or other acute complication. 6. Hepatic steatosis. 7. Aortic atherosclerosis.   12/28/2020 Genetic Testing   Negative hereditary cancer genetic testing: no pathogenic variants detected in [employer] Common Hereditary Cancers Panel.  The report date is December 28, 2020.    The Common Hereditary Cancers Panel offered by [employer] includes sequencing and/or deletion duplication testing of the following 47 genes: APC, ATM, AXIN2, BARD1, BMPR1A, BRCA1, BRCA2, BRIP1, CDH1, CDK4, CDKN2A (p14ARF), CDKN2A (p16INK4a), CHEK2, CTNNA1, DICER1, EPCAM (Deletion/duplication testing only), GREM1 (promoter region deletion/duplication testing only), GREM1, HOXB13, KIT, MEN1, MLH1, MSH2, MSH3, MSH6, MUTYH, NBN, NF1, NHTL1, PALB2, PDGFRA, PMS2, POLD1, POLE, PTEN, RAD50, RAD51C, RAD51D, SDHA, SDHB, SDHC, SDHD, SMAD4, SMARCA4. STK11, TP53, TSC1, TSC2, and VHL.  The following genes were evaluated for sequence changes only: SDHA and HOXB13 c.251G>A variant only.   01/17/2021 Imaging   CT CAP from [facility] IMPRESSION Small lesion in the pancreatic head measuring approximately 1.1  x 0.8 cm without evidence of vascular involvement or metastasis consistent with previously described, biopsy-proven pancreatic adenocarcinoma.   02/08/2021 Surgery   Whipple Surgery with Dr Abran Kallman  Final Pathologic Diagnosis      A.  GALLBLADDER, CHOLECYSTECTOMY: Chronic cholecystitis. No malignancy identified.   B.  BILE DUCT STENT, REMOVAL (GROSS ONLY DIAGNOSIS): Stent, see gross description.   C.  WHIPPLE RESECTION: No residual malignancy identified. Chronic and acute inflammation with granulation tissue reaction, fibrosis and features of chronic pancreatitis, suggestive of therapy effect. Fourteen lymph nodes, negative for metastasis (0/14). Margins negative for malignancy.        06/07/2021 Imaging   CT AP  IMPRESSION: 1. No current findings of recurrent malignancy. Interval Whipple procedure with expected postoperative findings. 2. A 3.5 cm stent is present in the dorsal pancreatic duct. No duct dilatation. There is an approximately 1.5 mm lucency centrally along this stent shown on image 43 series 7, possibilities include stent fracture, two separate stents, or an intentional radial lucency in this type of stent. 3. Small type 1 hiatal hernia. Mild distal esophageal wall thickening may reflect low-grade esophagitis. 4.  Aortic Atherosclerosis (ICD10-I70.0). 5.  Prominent stool throughout the colon favors constipation. 6. Uterine fibroid. 7. 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   11/14/2023 - 01/25/2024 Chemotherapy   Patient is on Treatment Plan : PANCREAS NALIRIFOX D1, 15 Q28D     02/20/2024 - 02/20/2024 Chemotherapy   Patient is on Treatment Plan : PANCREATIC Abraxane  D1,8,15 + Gemcitabine  D1,8,15 q28d     08/21/2024 -  Chemotherapy   Patient is on Treatment Plan : PANCREATIC Abraxane  D1,8,15 + Gemcitabine  D1,8,15 q28d     Port-A-Cath in place     Discussed the use of AI scribe software for clinical note  transcription with the patient,  who gave verbal consent to proceed.  History of Present Illness Jacqueline Tyler is a 63 year old female with metastatic pancreatic adenocarcinoma who presents for follow-up of disease status and ongoing chemotherapy.  She is receiving chemotherapy for metastatic pancreatic adenocarcinoma with peritoneal and stable pulmonary metastases. She denies new abdominal pain, gastrointestinal symptoms, abnormal bleeding, fever, nausea, or vomiting, and her weight is stable at 114 pounds. She had one episode of emesis after drinking water and another after oral contrast for a recent CT, with no recurrence. Her energy is good and she continues to work full-time, taking time off for infusions and one day afterward.  Her CA 19-9 has decreased from over 7,000 last month to around 2,000. She has no new or worsening symptoms. She has fatigue and increased somnolence for two to three days after chemotherapy but no nausea or diarrhea on the current regimen, which she tolerates well compared with a prior three-drug regimen that caused significant diarrhea.  She reports progressive hearing loss, needing higher television volume and difficulty hearing unless facing the speaker or when others are masked. Family has noted this decline, and she is unsure if it is related to chemotherapy.     All other systems were reviewed with the patient and are negative.  MEDICAL HISTORY:  Past Medical History:  Diagnosis Date   Anemia    Anxiety    Cancer (HCC) 07/2020   pancreatic cancer   Depression    Diabetes mellitus without complication (HCC) 07/2020   pancreatic cancer   High cholesterol    History of blood transfusion    History of hiatal hernia    small   Hypertension    Stroke [facility])    age 70, no residual effect    SURGICAL HISTORY: Past Surgical History:  Procedure Laterality Date   BILIARY BRUSHING  08/22/2020   Procedure: BILIARY BRUSHING;  Surgeon: Rosalie Kitchens, MD;  Location:  [facility] ENDOSCOPY;  Service: Endoscopy;;   BILIARY STENT PLACEMENT N/A 08/22/2020   Procedure: BILIARY STENT PLACEMENT;  Surgeon: Rosalie Kitchens, MD;  Location: [facility];  Service: Endoscopy;  Laterality: N/A;   BIOPSY OF SKIN SUBCUTANEOUS TISSUE AND/OR MUCOUS MEMBRANE  02/27/2024   Procedure: BIOPSY, SKIN, SUBCUTANEOUS TISSUE, OR MUCOUS MEMBRANE;  Surgeon: Mansouraty, Aloha Raddle., MD;  Location: [facility]OS[facility];  Service: Gastroenterology;;   BRONCHIAL BIOPSY  10/28/2023   Procedure: BRONCHIAL BIOPSIES;  Surgeon: Shelah Lamar RAMAN, MD;  Location: [facility] ENDOSCOPY;  Service: Pulmonary;;   BRONCHIAL BRUSHINGS  10/28/2023   Procedure: BRONCHIAL BRUSHINGS;  Surgeon: Shelah Lamar RAMAN, MD;  Location: [facility] ENDOSCOPY;  Service: Pulmonary;;   BRONCHIAL NEEDLE ASPIRATION BIOPSY  10/28/2023   Procedure: BRONCHIAL NEEDLE ASPIRATION BIOPSIES;  Surgeon: Shelah Lamar RAMAN, MD;  Location: [facility] ENDOSCOPY;  Service: Pulmonary;;   ENDOSCOPIC RETROGRADE CHOLANGIOPANCREATOGRAPHY (ERCP) WITH PROPOFOL  N/A 08/22/2020   Procedure: ENDOSCOPIC RETROGRADE CHOLANGIOPANCREATOGRAPHY (ERCP) WITH PROPOFOL ;  Surgeon: Rosalie Kitchens, MD;  Location: [facility];  Service: Endoscopy;  Laterality: N/A;   ESOPHAGOGASTRODUODENOSCOPY (EGD) WITH PROPOFOL  N/A 08/24/2020   Procedure: ESOPHAGOGASTRODUODENOSCOPY (EGD) WITH PROPOFOL ;  Surgeon: Burnette Fallow, MD;  Location: [facility];  Service: Endoscopy;  Laterality: N/A;   EUS N/A 02/27/2024   Procedure: ULTRASOUND, UPPER GI TRACT, ENDOSCOPIC;  Surgeon: Wilhelmenia Aloha Raddle., MD;  Location: [facility];  Service: Gastroenterology;  Laterality: N/A;   FIDUCIAL MARKER PLACEMENT  10/28/2023   Procedure: FIDUCIAL MARKER PLACEMENT;  Surgeon: Shelah Lamar RAMAN, MD;  Location: [facility] ENDOSCOPY;  Service: Pulmonary;;   FIDUCIAL MARKER PLACEMENT  02/27/2024  Procedure: INSERTION, FIDUCIAL MARKERS;  Surgeon: Wilhelmenia Aloha Raddle., MD;  Location: [facility];  Service: Gastroenterology;;   FINE NEEDLE ASPIRATION N/A 08/24/2020    Procedure: FINE NEEDLE ASPIRATION (FNA) LINEAR;  Surgeon: Burnette Fallow, MD;  Location: [facility];  Service: Endoscopy;  Laterality: N/A;   PORT-A-CATH REMOVAL N/A 12/08/2021   Procedure: REMOVAL PORT-A-CATH;  Surgeon: Dasie Leonor CROME, MD;  Location: [facility] OR;  Service: General;  Laterality: N/A;   PORTACATH PLACEMENT Right 09/08/2020   Procedure: INSERTION PORT-A-CATH;  Surgeon: Dasie Leonor CROME, MD;  Location: [facility];  Service: General;  Laterality: Right;   PORTACATH PLACEMENT N/A 11/06/2023   Procedure: INSERTION PORT-A-CATH;  Surgeon: Dasie Leonor CROME, MD;  Location: [facility]S;  Service: General;  Laterality: N/A;  LMA   SPHINCTEROTOMY  08/22/2020   Procedure: ANNETT;  Surgeon: Rosalie Kitchens, MD;  Location: [facility];  Service: Endoscopy;;   UPPER ESOPHAGEAL ENDOSCOPIC ULTRASOUND (EUS) N/A 08/24/2020   Procedure: UPPER ESOPHAGEAL ENDOSCOPIC ULTRASOUND (EUS);  Surgeon: Burnette Fallow, MD;  Location: [facility];  Service: Endoscopy;  Laterality: N/A;    I have reviewed the social history and family history with the patient and they are unchanged from previous note.  ALLERGIES:  is allergic to irinotecan  liposome, tyloxapol, oxycodone -acetaminophen , and poison ivy extract.  MEDICATIONS:  Current Outpatient Medications  Medication Sig Dispense Refill   acetaminophen  (TYLENOL ) 325 MG tablet Take 650 mg by mouth every 6 (six) hours as needed for moderate pain (pain score 4-6).     busPIRone (BUSPAR) 5 MG tablet Take 5 mg by mouth daily.     citalopram  (CELEXA ) 40 MG tablet Take 40 mg by mouth every evening.     CREON  36000-114000 units CPEP capsule TAKE 2 CAPSULES BY MOUTH 3 TIMES A DAY WITH A MEAL. MAY ALSO TAKE 1 CAPSULE AS NEEDED WITH SNACKS 240 capsule 2   KLOR-CON  M20 20 MEQ tablet TAKE 1 TABLET BY MOUTH TWICE A DAY 180 tablet 1   magnesium  oxide (MAG-OX) 400 (241.3 Mg) MG tablet Take 1 tablet (400 mg total) by mouth every evening.     ondansetron  (ZOFRAN ) 8  MG tablet Take 1 tablet (8 mg total) by mouth every 8 (eight) hours as needed for nausea or vomiting. 30 tablet 1   oxyCODONE  (OXY IR/ROXICODONE ) 5 MG immediate release tablet Take 1 tablet (5 mg total) by mouth every 6 (six) hours as needed for severe pain (pain score 7-10). 20 tablet 0   pantoprazole  (PROTONIX ) 40 MG tablet Take 1 tablet (40 mg total) by mouth 2 (two) times daily before a meal. Twice daily for 2 months then once daily thereafter. 60 tablet 11   potassium chloride  (KLOR-CON ) 10 MEQ tablet Take 1 tablet (10 mEq total) by mouth 2 (two) times daily. 60 tablet 1   prochlorperazine  (COMPAZINE ) 10 MG tablet Take 1 tablet (10 mg total) by mouth every 6 (six) hours as needed for nausea or vomiting. 30 tablet 1   simvastatin (ZOCOR) 80 MG tablet Take 80 mg by mouth every evening.     hydrochlorothiazide  (HYDRODIURIL ) 12.5 MG tablet TAKE 1/2 TABLET BY MOUTH EVERY DAY 45 tablet 1   No current facility-administered medications for this visit.    PHYSICAL EXAMINATION: ECOG PERFORMANCE STATUS: 1 - Symptomatic but completely ambulatory  Vitals:   11/10/24 0800 11/10/24 0812  BP: (!) 147/65 129/68  Pulse: 65 70  Resp: 15   Temp: 98 F (36.7 C)   SpO2: 100% 100%   Wt Readings  from Last 3 Encounters:  11/10/24 114 lb 1.6 oz (51.8 kg)  10/29/24 113 lb 9.6 oz (51.5 kg)  10/16/24 113 lb 9.6 oz (51.5 kg)     GENERAL:alert, no distress and comfortable SKIN: skin color, texture, turgor are normal, no rashes or significant lesions EYES: normal, Conjunctiva are pink and non-injected, sclera clear  Musculoskeletal:no cyanosis of digits and no clubbing  NEURO: alert & oriented x 3 with fluent speech, no focal motor/sensory deficits  Physical Exam MEASUREMENTS: Weight- 114.  LABORATORY DATA:  I have reviewed the data as listed    Latest Ref Rng & Units 11/10/2024    7:40 AM 10/29/2024   11:07 AM 10/16/2024    8:36 AM  CBC  WBC 4.0 - 10.5 K/uL 5.9  6.9  6.6   Hemoglobin 12.0 - 15.0  g/dL 8.9  9.3  8.9   Hematocrit 36.0 - 46.0 % 27.3  28.8  27.8   Platelets 150 - 400 K/uL 301  297  453         Latest Ref Rng & Units 11/10/2024    7:40 AM 10/29/2024   11:07 AM 10/16/2024    8:36 AM  CMP  Glucose 70 - 99 mg/dL 84  889  878   BUN 8 - 23 mg/dL 10  5  8    Creatinine 0.44 - 1.00 mg/dL 9.09  8.88  8.87   Sodium 135 - 145 mmol/L 141  140  141   Potassium 3.5 - 5.1 mmol/L 4.1  3.9  3.7   Chloride 98 - 111 mmol/L 107  107  106   CO2 22 - 32 mmol/L 27  26  28    Calcium  8.9 - 10.3 mg/dL 8.3  8.2  8.2   Total Protein 6.5 - 8.1 g/dL 6.1  6.0  6.1   Total Bilirubin 0.0 - 1.2 mg/dL 0.4  0.6  0.5   Alkaline Phos 38 - 126 U/L 102  136  172   AST 15 - 41 U/L 21  26  51   ALT 0 - 44 U/L 12  17  29        RADIOGRAPHIC STUDIES: I have personally reviewed the radiological images as listed and agreed with the findings in the report. No results found.    No orders of the defined types were placed in this encounter.  All questions were answered. The patient knows to call the clinic with any problems, questions or concerns. No barriers to learning was detected. The total time spent in the appointment was 40 minutes, including review of chart and various tests results, discussions about plan of care and coordination of care plan     Onita Mattock, MD 11/10/2024     "

## 2024-11-11 ENCOUNTER — Inpatient Hospital Stay: Admitting: Hematology

## 2024-11-11 ENCOUNTER — Inpatient Hospital Stay

## 2024-11-11 ENCOUNTER — Other Ambulatory Visit: Payer: Self-pay

## 2024-11-11 LAB — CANCER ANTIGEN 19-9: CA 19-9: 1414 U/mL — ABNORMAL HIGH (ref 0–35)

## 2024-11-13 ENCOUNTER — Inpatient Hospital Stay

## 2024-11-13 VITALS — BP 118/70 | HR 77 | Temp 98.5°F | Resp 17 | Ht 61.0 in | Wt 111.2 lb

## 2024-11-13 DIAGNOSIS — Z5111 Encounter for antineoplastic chemotherapy: Secondary | ICD-10-CM | POA: Diagnosis not present

## 2024-11-13 DIAGNOSIS — C250 Malignant neoplasm of head of pancreas: Secondary | ICD-10-CM

## 2024-11-13 MED ORDER — PACLITAXEL PROTEIN-BOUND CHEMO INJECTION 100 MG
100.0000 mg/m2 | Freq: Once | INTRAVENOUS | Status: AC
  Administered 2024-11-13: 155 mg via INTRAVENOUS
  Filled 2024-11-13: qty 31

## 2024-11-13 MED ORDER — PROCHLORPERAZINE MALEATE 10 MG PO TABS
10.0000 mg | ORAL_TABLET | Freq: Once | ORAL | Status: AC
  Administered 2024-11-13: 10 mg via ORAL
  Filled 2024-11-13: qty 1

## 2024-11-13 MED ORDER — SODIUM CHLORIDE 0.9 % IV SOLN: INTRAVENOUS | Status: DC

## 2024-11-13 MED ORDER — SODIUM CHLORIDE 0.9 % IV SOLN
800.0000 mg/m2 | Freq: Once | INTRAVENOUS | Status: AC
  Administered 2024-11-13: 1216 mg via INTRAVENOUS
  Filled 2024-11-13: qty 31.98

## 2024-11-13 NOTE — Patient Instructions (Signed)
 [facility] - A DEPT OF [facility]  Discharge Instructions: Thank you for choosing [facility] to provide your oncology and hematology care.   If you have a lab appointment with the Cancer Center, please go directly to the Cancer Center and check in at the registration area.   Wear comfortable clothing and clothing appropriate for easy access to any Portacath or PICC line.   We strive to give you quality time with your provider. You may need to reschedule your appointment if you arrive late (15 or more minutes).  Arriving late affects you and other patients whose appointments are after yours.  Also, if you miss three or more appointments without notifying the office, you may be dismissed from the clinic at the provider's discretion.      For prescription refill requests, have your pharmacy contact our office and allow 72 hours for refills to be completed.    Today you received the following chemotherapy and/or immunotherapy agents: paclitaxel-protein bound and gemcitabine      To help prevent nausea and vomiting after your treatment, we encourage you to take your nausea medication as directed.  BELOW ARE SYMPTOMS THAT SHOULD BE REPORTED IMMEDIATELY: *FEVER GREATER THAN 100.4 F (38 C) OR HIGHER *CHILLS OR SWEATING *NAUSEA AND VOMITING THAT IS NOT CONTROLLED WITH YOUR NAUSEA MEDICATION *UNUSUAL SHORTNESS OF BREATH *UNUSUAL BRUISING OR BLEEDING *URINARY PROBLEMS (pain or burning when urinating, or frequent urination) *BOWEL PROBLEMS (unusual diarrhea, constipation, pain near the anus) TENDERNESS IN MOUTH AND THROAT WITH OR WITHOUT PRESENCE OF ULCERS (sore throat, sores in mouth, or a toothache) UNUSUAL RASH, SWELLING OR PAIN  UNUSUAL VAGINAL DISCHARGE OR ITCHING   Items with * indicate a potential emergency and should be followed up as soon as possible or go to the Emergency Department if any problems should occur.  Please show the  CHEMOTHERAPY ALERT CARD or IMMUNOTHERAPY ALERT CARD at check-in to the Emergency Department and triage nurse.  Should you have questions after your visit or need to cancel or reschedule your appointment, please contact [facility] - A DEPT OF [facility]  Dept: [phone]  and follow the prompts.  Office hours are 8:00 a.m. to 4:30 p.m. Monday - Friday. Please note that voicemails left after 4:00 p.m. may not be returned until the following business day.  We are closed weekends and major holidays. You have access to a nurse at all times for urgent questions. Please call the main number to the clinic Dept: [phone] and follow the prompts.   For any non-urgent questions, you may also contact your provider using MyChart. We now offer e-Visits for anyone 5 and older to request care online for non-urgent symptoms. For details visit [email or web address].   Also download the MyChart app! Go to the app store, search "MyChart", open the app, select [facility], and log in with your MyChart username and password.

## 2024-11-20 ENCOUNTER — Other Ambulatory Visit: Payer: Self-pay

## 2024-11-25 ENCOUNTER — Other Ambulatory Visit: Payer: Self-pay | Admitting: Nurse Practitioner

## 2024-11-25 DIAGNOSIS — C250 Malignant neoplasm of head of pancreas: Secondary | ICD-10-CM

## 2024-11-25 NOTE — Progress Notes (Signed)
 " Patient Care Team: [name], DO as PCP - General (Internal Medicine) Lanny Callander, MD as Consulting Physician (Oncology) Dasie Leonor CROME, MD as Consulting Physician (General Surgery)  Clinic Day:  11/26/2024  Referring physician: Lanny Callander, MD  ASSESSMENT & PLAN:   Assessment & Plan: Pancreatic cancer [facility])  stage IIB, [name], [name], [name] -Diagnosed 08/24/20 on EUS with Dr Burnette, which showed mass at head of pancreas, s/p stenting, with SMV and PV invasion, biopsy confirmed adenocarcinoma with few malignant-appearing LNs in peripancreatic and porta hepatis region. Staging was negative for metastatic disease.  -she completed neoadjuvant chemo FOLFIRINOX q2 weeks 09/12/20 - 12/29/20. -s/p whipple surgery by Dr Debora on 02/08/21, path showed no residual disease.  -due to the high recurrence risk of pancreatic cancer, she is under close surveillance.  -Her PET scan from September 30, 2023 showed hypermetabolic soft tissue mass at the previous Whipple surgical site, and a small mildly hypermetabolic nodule in the left lung, concerning for metastatic recurrence. -lung biopsy 10/28/2023 confirmed adenocarcinoma, IHC consistent with metastatic pancreatic ca.  -she started chemo NALIRIFOX  on 11/14/23, had a significant diarrhea, required dose reduction. S/p 6 cycles  -restaging CT 02/03/2024 showed good partial response  -she started consolidation SBRT to lung and pancrease on 03/16/2024 and completed on 03/26/2024 -CT 05/21/2024 showed  treatment response in pancreas and lung nodule, but also showed scattered new left upper lobe pulmonary nodules measure up to 6 mm concerning for progressive pulmonary metastatic disease.  Her tumor marker CA 19.9 dropped down to normal previously and is back to 300's now, concerning for disease progression.  -she was asymptomatic and reluctant to have chemo again, so we decided to wait for 2-3 months. -repeated  CT 07/29/24 showed disease progression in lung and new peritoneal metastasis -I recommend starting chemotherapy again with gemcitabine  and Abraxane  every 2 weeks, due to her previous poor tolerance to chemo. She started on 08/21/2024 -CT 11/04/2024 showed cancer progression in the peritoneum -Will change chemotherapy to FOLFOX or 5-FU and liposomal irinotecan .  With patient's CA 19-9 level continuing to improve, ultimate decision made to continue with gemcitabine  and Abraxane  for additional 2 months.  Reevaluate change in treatment following next CT CAP in 2 months. - Patient tolerating chemotherapy gemcitabine  and Abraxane .  Proceed with proceed with cycle 4-day 15 chemotherapy gemcitabine  and Abraxane .   Anemia Likely related to treatment with chemotherapy.  Hgb 9.4 and HCT 29.0.  No requirement for blood transfusion or iron infusion today.  Will continue to monitor prior to every visit and treat deficiencies as indicated.  Diarrhea Chemotherapy-induced.  Takes Imodium with good effectiveness.  Continues with aggressive oral hydration to prevent electrolyte deficiency.  Plan Labs reviewed. -Stable anemia. - Unremarkable CMP. Labs and patient presentation are appropriate for treatment today. Proceed with cycle 4 day 15 chemotherapy gemcitabine  and Abraxane . Labs/labs, follow-up, and subsequent treatment as scheduled.  The patient understands the plans discussed today and is in agreement with them.  She knows to contact our office if she develops concerns prior to her next appointment.  I provided 25 minutes of face-to-face time during this encounter and > 50% was spent counseling as documented under my assessment and plan.    Powell FORBES Lessen, NP  [facility] - A DEPT OF [facility] [address] [place] [address] Dept: [phone] Dept Fax: [phone]   No orders of  the defined types were placed in this  encounter.     CHIEF COMPLAINT:  CC: Pancreatic cancer  Current Treatment: Chemotherapy gemcitabine  and Abraxane   INTERVAL HISTORY:  Jacqueline Tyler is here today for repeat clinical assessment.  She last saw Dr. Lanny on 11/10/2024.  She had restaging CT CAP in December which did show mild progression of peritoneal metastases.  Her CA 19-9 level has continued to decrease.  Plan for continued chemotherapy without change for additional 2 months.  Will repeat CT CAP in 2 months.  She does have mild peripheral neuropathy.  Manageable without prescription medication.  She denies chest pain, chest pressure, or shortness of breath. She denies headaches or visual disturbances. She denies abdominal pain, nausea, vomiting, or changes in bowel or bladder habits.  She does have some intermittent diarrhea.  Takes Imodium which is effective. She denies fevers or chills. She denies pain. Her appetite is good. Her weight has been stable.  I have reviewed the past medical history, past surgical history, social history and family history with the patient and they are unchanged from previous note.  ALLERGIES:  is allergic to irinotecan  liposome, tyloxapol, oxycodone -acetaminophen , and poison ivy extract.  MEDICATIONS:  Current Outpatient Medications  Medication Sig Dispense Refill   acetaminophen  (TYLENOL ) 325 MG tablet Take 650 mg by mouth every 6 (six) hours as needed for moderate pain (pain score 4-6).     busPIRone  (BUSPAR ) 5 MG tablet Take 5 mg by mouth 2 (two) times daily.     CREON  36000-114000 units CPEP capsule TAKE 2 CAPSULES BY MOUTH 3 TIMES A DAY WITH A MEAL. MAY ALSO TAKE 1 CAPSULE AS NEEDED WITH SNACKS 240 capsule 2   hydrochlorothiazide  (HYDRODIURIL ) 12.5 MG tablet TAKE 1/2 TABLET BY MOUTH EVERY DAY 45 tablet 1   ondansetron  (ZOFRAN ) 8 MG tablet Take 1 tablet (8 mg total) by mouth every 8 (eight) hours as needed for nausea or vomiting. 30 tablet 1   pantoprazole  (PROTONIX ) 40 MG tablet Take 1 tablet (40 mg  total) by mouth 2 (two) times daily before a meal. Twice daily for 2 months then once daily thereafter. 60 tablet 11   prochlorperazine  (COMPAZINE ) 10 MG tablet Take 1 tablet (10 mg total) by mouth every 6 (six) hours as needed for nausea or vomiting. 30 tablet 1   simvastatin (ZOCOR) 80 MG tablet Take 80 mg by mouth every evening.     apixaban  (ELIQUIS ) 2.5 MG TABS tablet Take 1 tablet (2.5 mg total) by mouth 2 (two) times daily. 40 tablet 0   citalopram  (CELEXA ) 40 MG tablet Take 0.5 tablets (20 mg total) by mouth every evening.     oxyCODONE  (OXY IR/ROXICODONE ) 5 MG immediate release tablet Take 1 tablet (5 mg total) by mouth every 6 (six) hours as needed for severe pain (pain score 7-10). 20 tablet 0   potassium chloride  SA (KLOR-CON  M20) 20 MEQ tablet Take 1 tablet (20 mEq total) by mouth daily.     Vitamin D, Ergocalciferol, (DRISDOL) 1.25 MG (50000 UNIT) CAPS capsule Take 50,000 Units by mouth every 7 (seven) days.     No current facility-administered medications for this visit.    HISTORY OF PRESENT ILLNESS:   Oncology History Overview Note  Cancer Staging Pancreatic cancer [facility]) Staging form: Exocrine Pancreas, AJCC 8th Edition - Clinical stage from 08/24/2020: Stage IIB (cT2, cN1, cM0) - Signed by Lanny Callander, MD on 08/30/2020 Stage prefix: Initial diagnosis    Pancreatic cancer (HCC)  08/20/2020 Imaging   CT  AP 08/20/20  IMPRESSION: 1. 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 Collateral circulation is noted. There is moderate intrahepatic and extrahepatic biliary dilatation which appears to be due to the pancreatic head mass. Pancreatic ductal dilatation is noted as well. 2. Probable 2.7 cm uterine fibroid. 3. Aortic atherosclerosis.   Aortic Atherosclerosis (ICD10-I70.0).   08/20/2020 Tumor  Marker   Ca19-9 - 927   08/22/2020 Procedure   ERCP by Dr Rosalie 08/22/20  IMPRESSION - The major papilla appeared normal. - A biliary sphincterotomy was performed. - Cells for cytology obtained in the lower third and middle of the main duct. - One plastic stent was placed into the common bile duct.   FINAL MICROSCOPIC DIAGNOSIS:  - No malignant cells identified  - Benign reactive/reparative changes   08/24/2020 Cancer Staging   Staging form: Exocrine Pancreas, AJCC 8th Edition - Clinical stage from 08/24/2020: Stage IIB (cT2, cN1, cM0) - Signed by Lanny Callander, MD on 08/30/2020   08/24/2020 Procedure   EUS by Dr burnette 08/24/20  IMPRESSION - There was no sign of significant pathology in the ampulla. - A few malignant-appearing lymph nodes were visualized in the peripancreatic region and porta hepatis region. - One stent was visualized endosonographically in the common bile duct. - A mass was identified in the pancreatic head. Tissue was obtained from this exam. The preliminary diagnosis is consistent with adenocarcinoma. Invasion into SMV/PV seen. Lymphadenopathy noted. This was staged T3 N1 Mx by endosonographic criteria. Fine needle aspiration performed.   08/24/2020 Initial Biopsy   FINAL MICROSCOPIC DIAGNOSIS: 08/24/20 - Malignant cells consistent with adenocarcinoma    08/30/2020 Initial Diagnosis   Pancreatic cancer (HCC)   09/05/2020 Imaging   CT Chest  IMPRESSION: 1. Stable 2.7 cm infiltrating pancreatic head mass with borderline enlarged peripancreatic lymph nodes. 2. No findings for pulmonary metastatic disease. 3. Small hiatal hernia. 4. Aortic atherosclerosis.   Aortic Atherosclerosis (ICD10-I70.0).   09/08/2020 Procedure   INSERTION PORT-A-CATH by Dr Dasie and Aron    09/12/2020 - 12/29/2020 Chemotherapy   Neoadjuvant FOLFIRINOX q2 weeks starting 09/12/20-12/29/20    12/02/2020 Imaging   CT CAP  IMPRESSION: 1. Previously noted mass of the central  pancreatic head is almost entirely resolved, difficult to discretely appreciate on current examination. Findings are consistent with treatment response. There remains obstruction of the pancreatic duct near the head neck junction with mild prominence of the pancreatic duct, measuring up to 5 mm, with atrophy of the distal pancreatic parenchyma. 2. The portal vein, splenic vein, and superior mesenteric vein are now widely patent, previously effaced at the confluence by mass effect. 3. Interval placement of common bile duct stent, tip positioned in the distal duodenum, with relief of previously seen biliary ductal dilatation. 4. 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 Following the bifurcation of the celiac mesenteric trunk, the celiac axis appears to directly traverse the vicinity of the mass, although there does appear to be a fat plane about the vessel. 5. The distal small bowel and colon are diffusely somewhat hyperenhancing and inflamed appearing with vascular combing and a tethered appearance of the distal small bowel. This appearance generally suggests inflammatory bowel disease such  as Crohn's disease. Correlate with referable clinical history, if present. No evidence of obstruction or other acute complication. 6. Hepatic steatosis. 7. Aortic atherosclerosis.   12/28/2020 Genetic Testing   Negative hereditary cancer genetic testing: no pathogenic variants detected in [facility] Common Hereditary Cancers Panel.  The report date is December 28, 2020.    The Common Hereditary Cancers Panel offered by [facility] includes sequencing and/or deletion duplication testing of the following 47 genes: APC, ATM, AXIN2, BARD1, BMPR1A, BRCA1, BRCA2, BRIP1, CDH1, CDK4, CDKN2A (p14ARF), CDKN2A (p16INK4a), CHEK2, CTNNA1, DICER1, EPCAM  (Deletion/duplication testing only), GREM1 (promoter region deletion/duplication testing only), GREM1, HOXB13, KIT, MEN1, MLH1, MSH2, MSH3, MSH6, MUTYH, NBN, NF1, NHTL1, PALB2, PDGFRA, PMS2, POLD1, POLE, PTEN, RAD50, RAD51C, RAD51D, SDHA, SDHB, SDHC, SDHD, SMAD4, SMARCA4. STK11, TP53, TSC1, TSC2, and VHL.  The following genes were evaluated for sequence changes only: SDHA and HOXB13 c.251G>A variant only.   01/17/2021 Imaging   CT CAP from [facility] IMPRESSION Small lesion in the pancreatic head measuring approximately 1.1 x 0.8 cm without evidence of vascular involvement or metastasis consistent with previously described, biopsy-proven pancreatic adenocarcinoma.   02/08/2021 Surgery   Whipple Surgery with Dr Abran Kallman  Final Pathologic Diagnosis      A.  GALLBLADDER, CHOLECYSTECTOMY: Chronic cholecystitis. No malignancy identified.   B.  BILE DUCT STENT, REMOVAL (GROSS ONLY DIAGNOSIS): Stent, see gross description.   C.  WHIPPLE RESECTION: No residual malignancy identified. Chronic and acute inflammation with granulation tissue reaction, fibrosis and features of chronic pancreatitis, suggestive of therapy effect. Fourteen lymph nodes, negative for metastasis (0/14). Margins negative for malignancy.        06/07/2021 Imaging   CT AP  IMPRESSION: 1. No current findings of recurrent malignancy. Interval Whipple procedure with expected postoperative findings. 2. A 3.5 cm stent is present in the dorsal pancreatic duct. No duct dilatation. There is an approximately 1.5 mm lucency centrally along this stent shown on image 43 series 7, possibilities include stent fracture, two separate stents, or an intentional radial lucency in this type of stent. 3. Small type 1 hiatal hernia. Mild distal esophageal wall thickening may reflect low-grade esophagitis. 4.  Aortic Atherosclerosis (ICD10-I70.0). 5.  Prominent stool throughout the colon favors constipation. 6. Uterine fibroid. 7. 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   11/14/2023 - 01/25/2024 Chemotherapy   Patient is on Treatment Plan : PANCREAS NALIRIFOX D1, 15 Q28D     02/20/2024 - 02/20/2024 Chemotherapy   Patient is on Treatment Plan : PANCREATIC Abraxane  D1,8,15 + Gemcitabine  D1,8,15 q28d     08/21/2024 -  Chemotherapy   Patient is on Treatment Plan : PANCREATIC Abraxane  D1,8,15 + Gemcitabine  D1,8,15 q28d     Port-A-Cath in place      REVIEW OF SYSTEMS:   Constitutional: Denies fevers, chills or abnormal weight loss. Fatigue.  Eyes: Denies blurriness of vision Ears, nose, mouth, throat, and face: Denies mucositis or sore throat Respiratory: Denies cough, dyspnea or wheezes Cardiovascular: Denies palpitation, chest discomfort or lower extremity swelling Gastrointestinal:  Denies nausea, heartburn or change in bowel habits. Intermittent diarrhea.  Skin: Denies abnormal skin rashes Lymphatics: Denies new lymphadenopathy or easy bruising Neurological:Denies numbness, tingling or new weaknesses Behavioral/Psych: Mood is stable, no new changes  All other systems were reviewed with the patient and are negative.   VITALS:   Today's Vitals   11/26/24 1103 11/26/24 1104  BP: 95/60   Pulse: 66   Resp: 17   Temp: (!) 97.5  F (36.4 C)   SpO2: 100%   Weight: 111 lb 14.4 oz (50.8 kg)   PainSc:  0-No pain   Body mass index is 21.14 kg/m.   Wt Readings from Last 3 Encounters:  12/11/24 109 lb 11.2 oz (49.8 kg)  11/28/24 111 lb 12.4 oz (50.7 kg)  11/26/24 111 lb 14.4 oz (50.8 kg)    Body mass index is 21.14 kg/m.  Performance status (ECOG): 2 - Symptomatic, <50% confined to bed  PHYSICAL EXAM:   GENERAL:alert, no distress and comfortable SKIN: skin color, texture, turgor are normal, no rashes or significant lesions EYES: normal, Conjunctiva are pink and non-injected, sclera clear OROPHARYNX:no exudate, no erythema and lips, buccal mucosa, and tongue normal   NECK: supple, thyroid normal size, non-tender, without nodularity LYMPH:  no palpable lymphadenopathy in the cervical, axillary or inguinal LUNGS: clear to auscultation and percussion with normal breathing effort HEART: regular rate & rhythm and no murmurs and no lower extremity edema ABDOMEN:abdomen soft, non-tender and normal bowel sounds Musculoskeletal:no cyanosis of digits and no clubbing  NEURO: alert & oriented x 3 with fluent speech, no focal motor/sensory deficits  LABORATORY DATA:  I have reviewed the data as listed    Component Value Date/Time   NA 137 12/11/2024 0845   K 3.7 12/11/2024 0845   CL 100 12/11/2024 0845   CO2 27 12/11/2024 0845   GLUCOSE 124 (H) 12/11/2024 0845   BUN 13 12/11/2024 0845   CREATININE 0.84 12/11/2024 0845   CALCIUM  8.7 (L) 12/11/2024 0845   PROT 6.5 12/11/2024 0845   ALBUMIN 3.4 (L) 12/11/2024 0845   AST 26 12/11/2024 0845   ALT 18 12/11/2024 0845   ALKPHOS 195 (H) 12/11/2024 0845   BILITOT 0.6 12/11/2024 0845   GFRNONAA >60 12/11/2024 0845   GFRAA >60 08/23/2020 0606    Lab Results  Component Value Date   WBC 6.7 12/11/2024   NEUTROABS 4.9 12/11/2024   HGB 10.6 (L) 12/11/2024   HCT 32.2 (L) 12/11/2024   MCV 94.7 12/11/2024   PLT 590 (H) 12/11/2024    RADIOGRAPHIC STUDIES: DG HIP UNILAT WITH PELVIS 2-3 VIEWS RIGHT Result Date: 11/29/2024 CLINICAL DATA:  Elective surgery. EXAM: DG HIP (WITH OR WITHOUT PELVIS) 2-3V RIGHT COMPARISON:  Preoperative imaging FINDINGS: Three fluoroscopic spot views of the hip submitted from the operating room. Femoral intramedullary nail with trans trochanteric and distal locking screw fixation traverse proximal femur fracture. Fluoroscopy time 58 seconds. Dose 5.55 mGy. IMPRESSION: Intraoperative fluoroscopy during proximal femur fracture ORIF. Electronically Signed   By: Andrea Gasman M.D.   On: 11/29/2024 11:37   DG C-Arm 1-60 Min-No Report Result Date: 11/29/2024 Fluoroscopy was utilized by the  requesting physician.  No radiographic interpretation.   DG Wrist Complete Right Result Date: 11/28/2024 EXAM: 3 OR MORE VIEW(S) XRAY OF THE WRIST 11/28/2024 05:19:16 PM COMPARISON: None available. CLINICAL HISTORY: fall FINDINGS: BONES AND JOINTS: Mildly impacted fracture of distal radial metaphysis. Neutral tilt of the distal radial articular surface. No dislocation. No malalignment. Mild osteopenia. SOFT TISSUES: Mild soft tissue swelling. IMPRESSION: 1. Mildly impacted fracture of the distal radial metaphysis with neutral tilt of the distal radial articular surface. No dislocation. 2. Mild soft tissue swelling. Electronically signed by: Dorethia Molt MD MD 11/28/2024 05:38 PM EST RP Workstation: [id number]   DG Hip Unilat  With Pelvis 2-3 Views Right Result Date: 11/28/2024 EXAM: 2 OR MORE VIEW(S) XRAY OF THE RIGHT HIP 11/28/2024 05:19:16 PM COMPARISON: None available. CLINICAL HISTORY: severe pain  per pt, fall FINDINGS: BONES AND JOINTS: Intertrochanteric fracture of the right hip with varus angulation. SOFT TISSUES: Large rectal stool ball. IMPRESSION: 1. Intertrochanteric fracture of the right hip with varus angulation. 2. Large rectal stool ball. Electronically signed by: Dorethia Molt MD MD 11/28/2024 05:37 PM EST RP Workstation: [id number]   "

## 2024-11-25 NOTE — Assessment & Plan Note (Addendum)
" °  stage IIB, [name], [name], [name] -Diagnosed 08/24/20 on EUS with Dr Burnette, which showed mass at head of pancreas, s/p stenting, with SMV and PV invasion, biopsy confirmed adenocarcinoma with few malignant-appearing LNs in peripancreatic and porta hepatis region. Staging was negative for metastatic disease.  -she completed neoadjuvant chemo FOLFIRINOX q2 weeks 09/12/20 - 12/29/20. -s/p whipple surgery by Dr Debora on 02/08/21, path showed no residual disease.  -due to the high recurrence risk of pancreatic cancer, she is under close surveillance.  -Her PET scan from September 30, 2023 showed hypermetabolic soft tissue mass at the previous Whipple surgical site, and a small mildly hypermetabolic nodule in the left lung, concerning for metastatic recurrence. -lung biopsy 10/28/2023 confirmed adenocarcinoma, IHC consistent with metastatic pancreatic ca.  -she started chemo NALIRIFOX  on 11/14/23, had a significant diarrhea, required dose reduction. S/p 6 cycles  -restaging CT 02/03/2024 showed good partial response  -she started consolidation SBRT to lung and pancrease on 03/16/2024 and completed on 03/26/2024 -CT 05/21/2024 showed  treatment response in pancreas and lung nodule, but also showed scattered new left upper lobe pulmonary nodules measure up to 6 mm concerning for progressive pulmonary metastatic disease.  Her tumor marker CA 19.9 dropped down to normal previously and is back to 300's now, concerning for disease progression.  -she was asymptomatic and reluctant to have chemo again, so we decided to wait for 2-3 months. -repeated CT 07/29/24 showed disease progression in lung and new peritoneal metastasis -I recommend starting chemotherapy again with gemcitabine  and Abraxane  every 2 weeks, due to her previous poor tolerance to chemo. She started on 08/21/2024 -CT 11/04/2024 showed cancer progression in the peritoneum -Will change chemotherapy to FOLFOX  or 5-FU and liposomal irinotecan .  With patient's CA 19-9 level continuing to improve, ultimate decision made to continue with gemcitabine  and Abraxane  for additional 2 months.  Reevaluate change in treatment following next CT CAP in 2 months. - Patient tolerating chemotherapy gemcitabine  and Abraxane .  Proceed with proceed with cycle 4-day 15 chemotherapy gemcitabine  and Abraxane . "

## 2024-11-26 ENCOUNTER — Inpatient Hospital Stay (HOSPITAL_BASED_OUTPATIENT_CLINIC_OR_DEPARTMENT_OTHER): Admitting: Nurse Practitioner

## 2024-11-26 ENCOUNTER — Inpatient Hospital Stay

## 2024-11-26 ENCOUNTER — Inpatient Hospital Stay: Attending: Hematology

## 2024-11-26 VITALS — BP 95/60 | HR 66 | Temp 97.5°F | Resp 17 | Wt 111.9 lb

## 2024-11-26 DIAGNOSIS — C250 Malignant neoplasm of head of pancreas: Secondary | ICD-10-CM

## 2024-11-26 LAB — CBC WITH DIFFERENTIAL (CANCER CENTER ONLY)
Abs Immature Granulocytes: 0.02 K/uL (ref 0.00–0.07)
Basophils Absolute: 0 K/uL (ref 0.0–0.1)
Basophils Relative: 0 %
Eosinophils Absolute: 0.1 K/uL (ref 0.0–0.5)
Eosinophils Relative: 1 %
HCT: 29 % — ABNORMAL LOW (ref 36.0–46.0)
Hemoglobin: 9.4 g/dL — ABNORMAL LOW (ref 12.0–15.0)
Immature Granulocytes: 0 %
Lymphocytes Relative: 12 %
Lymphs Abs: 0.7 K/uL (ref 0.7–4.0)
MCH: 32.1 pg (ref 26.0–34.0)
MCHC: 32.4 g/dL (ref 30.0–36.0)
MCV: 99 fL (ref 80.0–100.0)
Monocytes Absolute: 0.9 K/uL (ref 0.1–1.0)
Monocytes Relative: 15 %
Neutro Abs: 4.5 K/uL (ref 1.7–7.7)
Neutrophils Relative %: 72 %
Platelet Count: 273 K/uL (ref 150–400)
RBC: 2.93 MIL/uL — ABNORMAL LOW (ref 3.87–5.11)
RDW: 15.3 % (ref 11.5–15.5)
WBC Count: 6.3 K/uL (ref 4.0–10.5)
nRBC: 0 % (ref 0.0–0.2)

## 2024-11-26 LAB — CMP (CANCER CENTER ONLY)
ALT: 24 U/L (ref 0–44)
AST: 39 U/L (ref 15–41)
Albumin: 3.3 g/dL — ABNORMAL LOW (ref 3.5–5.0)
Alkaline Phosphatase: 140 U/L — ABNORMAL HIGH (ref 38–126)
Anion gap: 7 (ref 5–15)
BUN: 7 mg/dL — ABNORMAL LOW (ref 8–23)
CO2: 26 mmol/L (ref 22–32)
Calcium: 8.3 mg/dL — ABNORMAL LOW (ref 8.9–10.3)
Chloride: 104 mmol/L (ref 98–111)
Creatinine: 0.84 mg/dL (ref 0.44–1.00)
GFR, Estimated: >60 mL/min
Glucose, Bld: 111 mg/dL — ABNORMAL HIGH (ref 70–99)
Potassium: 4.4 mmol/L (ref 3.5–5.1)
Sodium: 137 mmol/L (ref 135–145)
Total Bilirubin: 0.6 mg/dL (ref 0.0–1.2)
Total Protein: 6.3 g/dL — ABNORMAL LOW (ref 6.5–8.1)

## 2024-11-26 MED ORDER — PACLITAXEL PROTEIN-BOUND CHEMO INJECTION 100 MG
100.0000 mg/m2 | Freq: Once | INTRAVENOUS | Status: AC
  Administered 2024-11-26: 155 mg via INTRAVENOUS
  Filled 2024-11-26: qty 31

## 2024-11-26 MED ORDER — SODIUM CHLORIDE 0.9 % IV SOLN
800.0000 mg/m2 | Freq: Once | INTRAVENOUS | Status: AC
  Administered 2024-11-26: 1216 mg via INTRAVENOUS
  Filled 2024-11-26: qty 31.98

## 2024-11-26 MED ORDER — PROCHLORPERAZINE MALEATE 10 MG PO TABS
10.0000 mg | ORAL_TABLET | Freq: Once | ORAL | Status: AC
  Administered 2024-11-26: 10 mg via ORAL
  Filled 2024-11-26: qty 1

## 2024-11-26 MED ORDER — SODIUM CHLORIDE 0.9% FLUSH: 10.0000 mL | INTRAVENOUS | Status: DC | PRN

## 2024-11-26 MED ORDER — SODIUM CHLORIDE 0.9 % IV SOLN: INTRAVENOUS | Status: DC

## 2024-11-26 NOTE — Patient Instructions (Signed)
 [facility] - A DEPT OF [facility] [place]  Discharge Instructions: Thank you for choosing [facility] to provide your oncology and hematology care.   If you have a lab appointment with the Cancer Center, please go directly to the Cancer Center and check in at the registration area.   Wear comfortable clothing and clothing appropriate for easy access to any Portacath or PICC line.   We strive to give you quality time with your provider. You may need to reschedule your appointment if you arrive late (15 or more minutes).  Arriving late affects you and other patients whose appointments are after yours.  Also, if you miss three or more appointments without notifying the office, you may be dismissed from the clinic at the provider's discretion.      For prescription refill requests, have your pharmacy contact our office and allow 72 hours for refills to be completed.    Today you received the following chemotherapy and/or immunotherapy agents abraxane and gemzar       To help prevent nausea and vomiting after your treatment, we encourage you to take your nausea medication as directed.  BELOW ARE SYMPTOMS THAT SHOULD BE REPORTED IMMEDIATELY: *FEVER GREATER THAN 100.4 F (38 C) OR HIGHER *CHILLS OR SWEATING *NAUSEA AND VOMITING THAT IS NOT CONTROLLED WITH YOUR NAUSEA MEDICATION *UNUSUAL SHORTNESS OF BREATH *UNUSUAL BRUISING OR BLEEDING *URINARY PROBLEMS (pain or burning when urinating, or frequent urination) *BOWEL PROBLEMS (unusual diarrhea, constipation, pain near the anus) TENDERNESS IN MOUTH AND THROAT WITH OR WITHOUT PRESENCE OF ULCERS (sore throat, sores in mouth, or a toothache) UNUSUAL RASH, SWELLING OR PAIN  UNUSUAL VAGINAL DISCHARGE OR ITCHING   Items with * indicate a potential emergency and should be followed up as soon as possible or go to the Emergency Department if any problems should occur.  Please show the CHEMOTHERAPY ALERT CARD or  IMMUNOTHERAPY ALERT CARD at check-in to the Emergency Department and triage nurse.  Should you have questions after your visit or need to cancel or reschedule your appointment, please contact [facility] - [facility] DE[facility]  Dept: [phone]  and follow the prompts.  Office hours are 8:00 a.m. to 4:30 p.m. Monday - Friday. Please note that voicemails left after 4:00 p.m. may not be returned until the following business day.  We are closed weekends and major holidays. You have access to a nurse at all times for urgent questions. Please call the main number to the clinic Dept: [phone] and follow the prompts.   For any non-urgent questions, you may also contact your provider using MyChart. We now offer e-Visits for anyone 58 and older to request care online for non-urgent symptoms. For details visit [email or web address].   Also download the MyChart app! Go to the app store, search "MyChart", open the app, select [place], and log in with your MyChart username and password.

## 2024-11-28 ENCOUNTER — Encounter (HOSPITAL_COMMUNITY): Payer: Self-pay

## 2024-11-28 ENCOUNTER — Other Ambulatory Visit: Payer: Self-pay

## 2024-11-28 ENCOUNTER — Other Ambulatory Visit (HOSPITAL_COMMUNITY): Payer: Self-pay

## 2024-11-28 ENCOUNTER — Emergency Department (HOSPITAL_COMMUNITY)

## 2024-11-28 ENCOUNTER — Inpatient Hospital Stay (HOSPITAL_COMMUNITY)
Admission: EM | Admit: 2024-11-28 | Discharge: 2024-12-03 | DRG: 481 | Disposition: A | Discharge status: Home / Self Care | Attending: Internal Medicine | Admitting: Internal Medicine

## 2024-11-28 DIAGNOSIS — S52301A Unspecified fracture of shaft of right radius, initial encounter for closed fracture: Secondary | ICD-10-CM | POA: Diagnosis present

## 2024-11-28 DIAGNOSIS — Z8249 Family history of ischemic heart disease and other diseases of the circulatory system: Secondary | ICD-10-CM | POA: Diagnosis not present

## 2024-11-28 DIAGNOSIS — S72009A Fracture of unspecified part of neck of unspecified femur, initial encounter for closed fracture: Secondary | ICD-10-CM | POA: Diagnosis present

## 2024-11-28 DIAGNOSIS — Z79899 Other long term (current) drug therapy: Secondary | ICD-10-CM | POA: Diagnosis not present

## 2024-11-28 DIAGNOSIS — W19XXXA Unspecified fall, initial encounter: Secondary | ICD-10-CM | POA: Diagnosis present

## 2024-11-28 DIAGNOSIS — R9431 Abnormal electrocardiogram [ECG] [EKG]: Secondary | ICD-10-CM | POA: Diagnosis present

## 2024-11-28 DIAGNOSIS — Z95828 Presence of other vascular implants and grafts: Secondary | ICD-10-CM | POA: Diagnosis not present

## 2024-11-28 DIAGNOSIS — E119 Type 2 diabetes mellitus without complications: Secondary | ICD-10-CM | POA: Diagnosis present

## 2024-11-28 DIAGNOSIS — D62 Acute posthemorrhagic anemia: Secondary | ICD-10-CM | POA: Diagnosis not present

## 2024-11-28 DIAGNOSIS — Z8673 Personal history of transient ischemic attack (TIA), and cerebral infarction without residual deficits: Secondary | ICD-10-CM | POA: Diagnosis not present

## 2024-11-28 DIAGNOSIS — S72001A Fracture of unspecified part of neck of right femur, initial encounter for closed fracture: Secondary | ICD-10-CM | POA: Diagnosis present

## 2024-11-28 DIAGNOSIS — F32A Depression, unspecified: Secondary | ICD-10-CM | POA: Diagnosis present

## 2024-11-28 DIAGNOSIS — Z743 Need for continuous supervision: Secondary | ICD-10-CM | POA: Diagnosis not present

## 2024-11-28 DIAGNOSIS — D630 Anemia in neoplastic disease: Secondary | ICD-10-CM | POA: Diagnosis present

## 2024-11-28 DIAGNOSIS — E7800 Pure hypercholesterolemia, unspecified: Secondary | ICD-10-CM | POA: Diagnosis present

## 2024-11-28 DIAGNOSIS — R0902 Hypoxemia: Secondary | ICD-10-CM | POA: Diagnosis not present

## 2024-11-28 DIAGNOSIS — W010XXA Fall on same level from slipping, tripping and stumbling without subsequent striking against object, initial encounter: Secondary | ICD-10-CM | POA: Diagnosis present

## 2024-11-28 DIAGNOSIS — Z888 Allergy status to other drugs, medicaments and biological substances status: Secondary | ICD-10-CM | POA: Diagnosis not present

## 2024-11-28 DIAGNOSIS — M25551 Pain in right hip: Secondary | ICD-10-CM | POA: Diagnosis not present

## 2024-11-28 DIAGNOSIS — C259 Malignant neoplasm of pancreas, unspecified: Secondary | ICD-10-CM | POA: Diagnosis present

## 2024-11-28 DIAGNOSIS — I10 Essential (primary) hypertension: Secondary | ICD-10-CM | POA: Diagnosis present

## 2024-11-28 DIAGNOSIS — F419 Anxiety disorder, unspecified: Secondary | ICD-10-CM | POA: Diagnosis present

## 2024-11-28 DIAGNOSIS — S72141A Displaced intertrochanteric fracture of right femur, initial encounter for closed fracture: Secondary | ICD-10-CM | POA: Diagnosis present

## 2024-11-28 DIAGNOSIS — C250 Malignant neoplasm of head of pancreas: Secondary | ICD-10-CM | POA: Diagnosis not present

## 2024-11-28 DIAGNOSIS — S52501A Unspecified fracture of the lower end of right radius, initial encounter for closed fracture: Secondary | ICD-10-CM

## 2024-11-28 LAB — CBC WITH DIFFERENTIAL/PLATELET
Abs Immature Granulocytes: 0.08 K/uL — ABNORMAL HIGH (ref 0.00–0.07)
Basophils Absolute: 0 K/uL (ref 0.0–0.1)
Basophils Relative: 0 %
Eosinophils Absolute: 0 K/uL (ref 0.0–0.5)
Eosinophils Relative: 0 %
HCT: 27.4 % — ABNORMAL LOW (ref 36.0–46.0)
Hemoglobin: 9 g/dL — ABNORMAL LOW (ref 12.0–15.0)
Immature Granulocytes: 1 %
Lymphocytes Relative: 1 %
Lymphs Abs: 0.1 K/uL — ABNORMAL LOW (ref 0.7–4.0)
MCH: 33.1 pg (ref 26.0–34.0)
MCHC: 32.8 g/dL (ref 30.0–36.0)
MCV: 100.7 fL — ABNORMAL HIGH (ref 80.0–100.0)
Monocytes Absolute: 0.1 K/uL (ref 0.1–1.0)
Monocytes Relative: 1 %
Neutro Abs: 13.5 K/uL — ABNORMAL HIGH (ref 1.7–7.7)
Neutrophils Relative %: 97 %
Platelets: 284 K/uL (ref 150–400)
RBC: 2.72 MIL/uL — ABNORMAL LOW (ref 3.87–5.11)
RDW: 14.9 % (ref 11.5–15.5)
WBC: 13.8 K/uL — ABNORMAL HIGH (ref 4.0–10.5)
nRBC: 0 % (ref 0.0–0.2)

## 2024-11-28 LAB — COMPREHENSIVE METABOLIC PANEL WITH GFR
ALT: 35 U/L (ref 0–44)
AST: 54 U/L — ABNORMAL HIGH (ref 15–41)
Albumin: 3.1 g/dL — ABNORMAL LOW (ref 3.5–5.0)
Alkaline Phosphatase: 218 U/L — ABNORMAL HIGH (ref 38–126)
Anion gap: 10 (ref 5–15)
BUN: 14 mg/dL (ref 8–23)
CO2: 25 mmol/L (ref 22–32)
Calcium: 8.5 mg/dL — ABNORMAL LOW (ref 8.9–10.3)
Chloride: 99 mmol/L (ref 98–111)
Creatinine, Ser: 0.84 mg/dL (ref 0.44–1.00)
GFR, Estimated: >60 mL/min
Glucose, Bld: 122 mg/dL — ABNORMAL HIGH (ref 70–99)
Potassium: 3.7 mmol/L (ref 3.5–5.1)
Sodium: 134 mmol/L — ABNORMAL LOW (ref 135–145)
Total Bilirubin: 1.2 mg/dL (ref 0.0–1.2)
Total Protein: 6.3 g/dL — ABNORMAL LOW (ref 6.5–8.1)

## 2024-11-28 LAB — GLUCOSE, CAPILLARY: Glucose-Capillary: 120 mg/dL — ABNORMAL HIGH (ref 70–99)

## 2024-11-28 MED ORDER — ONDANSETRON HCL 4 MG PO TABS: 4.0000 mg | ORAL_TABLET | Freq: Four times a day (QID) | ORAL | Status: DC | PRN

## 2024-11-28 MED ORDER — CHLORHEXIDINE GLUCONATE CLOTH 2 % EX PADS
6.0000 | MEDICATED_PAD | Freq: Every day | CUTANEOUS | Status: DC
  Administered 2024-11-28 – 2024-12-03 (×5): 6 via TOPICAL

## 2024-11-28 MED ORDER — DIAZEPAM 2 MG PO TABS
2.0000 mg | ORAL_TABLET | Freq: Once | ORAL | Status: AC
  Administered 2024-11-28: 2 mg via ORAL
  Filled 2024-11-28: qty 1

## 2024-11-28 MED ORDER — INSULIN ASPART 100 UNIT/ML IJ SOLN
0.0000 [IU] | INTRAMUSCULAR | Status: DC
  Administered 2024-11-29: 2 [IU] via SUBCUTANEOUS
  Administered 2024-11-29 – 2024-11-30 (×3): 1 [IU] via SUBCUTANEOUS
  Filled 2024-11-28: qty 1
  Filled 2024-11-28: qty 2
  Filled 2024-11-28 (×2): qty 1

## 2024-11-28 MED ORDER — FENTANYL CITRATE (PF) 50 MCG/ML IJ SOSY
50.0000 ug | PREFILLED_SYRINGE | Freq: Once | INTRAMUSCULAR | Status: AC
  Administered 2024-11-28: 50 ug via INTRAVENOUS
  Filled 2024-11-28: qty 1

## 2024-11-28 MED ORDER — SODIUM CHLORIDE 0.9% FLUSH: 10.0000 mL | INTRAVENOUS | Status: DC | PRN

## 2024-11-28 MED ORDER — MORPHINE SULFATE (PF) 4 MG/ML IV SOLN
4.0000 mg | Freq: Once | INTRAVENOUS | Status: AC
  Administered 2024-11-28: 4 mg via INTRAVENOUS
  Filled 2024-11-28: qty 1

## 2024-11-28 MED ORDER — BISACODYL 10 MG RE SUPP: 10.0000 mg | Freq: Every day | RECTAL | Status: DC | PRN

## 2024-11-28 MED ORDER — MORPHINE SULFATE (PF) 4 MG/ML IV SOLN: 4.0000 mg | Freq: Once | INTRAVENOUS | Status: DC

## 2024-11-28 MED ORDER — POLYETHYLENE GLYCOL 3350 17 G PO PACK: 17.0000 g | PACK | Freq: Every day | ORAL | Status: DC | PRN

## 2024-11-28 MED ORDER — ONDANSETRON HCL 4 MG/2ML IJ SOLN: 4.0000 mg | Freq: Four times a day (QID) | INTRAMUSCULAR | Status: DC | PRN

## 2024-11-28 MED ORDER — MORPHINE SULFATE (PF) 2 MG/ML IV SOLN
2.0000 mg | INTRAVENOUS | Status: DC | PRN
  Administered 2024-11-28 – 2024-12-03 (×9): 2 mg via INTRAVENOUS
  Filled 2024-11-28 (×9): qty 1

## 2024-11-28 MED ORDER — ONDANSETRON HCL 4 MG/2ML IJ SOLN
4.0000 mg | Freq: Once | INTRAMUSCULAR | Status: AC
  Administered 2024-11-28: 4 mg via INTRAVENOUS
  Filled 2024-11-28: qty 2

## 2024-11-28 NOTE — Progress Notes (Signed)
 Plan for IM nail for hip fracture tomorrow. Patient to be NPO after midnight. Formal consult to follow later this evening. Patient needs transfer to [facility] for admission under hospitalist as surgeon Dr Dozier is available and operating at [facility] tomorrow. Transfer center request placed

## 2024-11-28 NOTE — Assessment & Plan Note (Signed)
 The patient was advised to remain NPO after midnight in preparation for surgery.  A discussion was held regarding code status, and the patient confirmed she wishes to be full code, desiring all life-saving interventions including chest compressions, defibrillation, and ventilator support if necessary. - Treatment planned: The plan is for surgical fixation of the hip fracture tomorrow at [facility]. Pain will be managed with appropriate medications until surgery. IV fluids will be administered while NPO. - IV pain meds and mIVF - NPO after mn - hold home medications until after surgery.  - PT/OT consult, likely will need SNF placement

## 2024-11-28 NOTE — Assessment & Plan Note (Signed)
 Diet controlled.   - sensitive ssi q4h while npo.

## 2024-11-28 NOTE — H&P (Signed)
 " History and Physical    Patient: Jacqueline Tyler FMW:[id number] DOB: September 01, 1961 DOA: 11/28/2024 DOS: the patient was seen and examined on 11/28/2024 PCP: [name], DO  Patient coming from: Home    Chief Complaint:  Chief Complaint  Patient presents with   Fall   Hip Pain   HPI: Jacqueline Tyler is a 64 y.o. female with medical history significant of   - Current issues or reasons for visit: Admitted for management of a right hip fracture sustained after a fall. - Symptom characteristics: The fall occurred this afternoon at approximately 1:30 PM after tripping over a dog. The patient was unable to get up due to severe pain. She denies any loss of consciousness or head injury, stating she caught herself with her arm. - Symptom modifiers or self-management: She has received fentanyl  twice and morphine  for pain management in the emergency department. - Associated symptoms: The patient also sustained a mildly impacted fracture of the distal right radial metaphysis.  The patient is currently undergoing chemotherapy for metastatic pancreatic cancer.  Last treatment was Thursday.    Review of Systems: As mentioned in the history of present illness. All other systems reviewed and are negative. Past Medical History:  Diagnosis Date   Anemia    Anxiety    Cancer (HCC) 07/2020   pancreatic cancer   Depression    Diabetes mellitus without complication (HCC) 07/2020   pancreatic cancer   High cholesterol    History of blood transfusion    History of hiatal hernia    small   Hypertension    Stroke [facility])    age 29, no residual effect   Past Surgical History:  Procedure Laterality Date   BILIARY BRUSHING  08/22/2020   Procedure: BILIARY BRUSHING;  Surgeon: Rosalie Kitchens, MD;  Location: [facility] ENDOSCOPY;  Service: Endoscopy;;   BILIARY STENT PLACEMENT N/A 08/22/2020   Procedure: BILIARY STENT PLACEMENT;  Surgeon: Rosalie Kitchens, MD;  [facility];  Service: Endoscopy;  Laterality: N/A;    BIOPSY OF SKIN SUBCUTANEOUS TISSUE AND/OR MUCOUS MEMBRANE  02/27/2024   Procedure: BIOPSY, SKIN, SUBCUTANEOUS TISSUE, OR MUCOUS MEMBRANE;  Surgeon: Mansouraty, Aloha Raddle., MD;  [facility];  Service: Gastroenterology;;   BRONCHIAL BIOPSY  10/28/2023   Procedure: BRONCHIAL BIOPSIES;  Surgeon: Shelah Lamar RAMAN, MD;  Location: [facility] ENDOSCOPY;  Service: Pulmonary;;   BRONCHIAL BRUSHINGS  10/28/2023   Procedure: BRONCHIAL BRUSHINGS;  Surgeon: Shelah Lamar RAMAN, MD;  Location: [facility] ENDOSCOPY;  Service: Pulmonary;;   BRONCHIAL NEEDLE ASPIRATION BIOPSY  10/28/2023   Procedure: BRONCHIAL NEEDLE ASPIRATION BIOPSIES;  Surgeon: Shelah Lamar RAMAN, MD;  Location: [facility] ENDOSCOPY;  Service: Pulmonary;;   ENDOSCOPIC RETROGRADE CHOLANGIOPANCREATOGRAPHY (ERCP) WITH PROPOFOL  N/A 08/22/2020   Procedure: ENDOSCOPIC RETROGRADE CHOLANGIOPANCREATOGRAPHY (ERCP) WITH PROPOFOL ;  Surgeon: Rosalie Kitchens, MD;  [facility];  Service: Endoscopy;  Laterality: N/A;   ESOPHAGOGASTRODUODENOSCOPY (EGD) WITH PROPOFOL  N/A 08/24/2020   Procedure: ESOPHAGOGASTRODUODENOSCOPY (EGD) WITH PROPOFOL ;  Surgeon: Burnette Fallow, MD;  [facility];  Service: Endoscopy;  Laterality: N/A;   EUS N/A 02/27/2024   Procedure: ULTRASOUND, UPPER GI TRACT, ENDOSCOPIC;  Surgeon: Wilhelmenia Aloha Raddle., MD;  [facility];  Service: Gastroenterology;  Laterality: N/A;   FIDUCIAL MARKER PLACEMENT  10/28/2023   Procedure: FIDUCIAL MARKER PLACEMENT;  Surgeon: Shelah Lamar RAMAN, MD;  Location: [facility] ENDOSCOPY;  Service: Pulmonary;;   FIDUCIAL MARKER PLACEMENT  02/27/2024   Procedure: INSERTION, FIDUCIAL MARKERS;  Surgeon: Wilhelmenia Aloha Raddle., MD;  [facility];  Service: Gastroenterology;;   FINE NEEDLE ASPIRATION N/A 08/24/2020   Procedure: FINE NEEDLE ASPIRATION (FNA) LINEAR;  Surgeon: Burnette Fallow, MD;  [facility];  Service: Endoscopy;  Laterality: N/A;   PORT-A-CATH REMOVAL N/A 12/08/2021   Procedure: REMOVAL  PORT-A-CATH;  Surgeon: Dasie Leonor CROME, MD;  Location: [facility] OR;  Service: General;  Laterality: N/A;   PORTACATH PLACEMENT Right 09/08/2020   Procedure: INSERTION PORT-A-CATH;  Surgeon: Dasie Leonor CROME, MD;  Location:  [facility];  Service: General;  Laterality: Right;   PORTACATH PLACEMENT N/A 11/06/2023   Procedure: INSERTION PORT-A-CATH;  Surgeon: Dasie Leonor CROME, MD;  [facility] ORS;  Service: General;  Laterality: N/A;  LMA   SPHINCTEROTOMY  08/22/2020   Procedure: ANNETT;  Surgeon: Rosalie Kitchens, MD;  [facility];  Service: Endoscopy;;   UPPER ESOPHAGEAL ENDOSCOPIC ULTRASOUND (EUS) N/A 08/24/2020   Procedure: UPPER ESOPHAGEAL ENDOSCOPIC ULTRASOUND (EUS);  Surgeon: Burnette Fallow, MD;  Location: [facility];  Service: Endoscopy;  Laterality: N/A;   Social History:  reports that she has never smoked. She has never used smokeless tobacco. She reports that she does not drink alcohol and does not use drugs.  Allergies[1]  Family History  Problem Relation Age of Onset   CAD Mother    Cancer Cousin        paternal cousin; unknown type; dx >50   Other Cousin        paternal cousin; brain tumor    Prior to Admission medications  Medication Sig Start Date End Date Taking? Authorizing Provider  acetaminophen  (TYLENOL ) 325 MG tablet Take 650 mg by mouth every 6 (six) hours as needed for moderate pain (pain score 4-6).    [provider]  busPIRone  (BUSPAR ) 5 MG tablet Take 5 mg by mouth daily.    [provider]  citalopram  (CELEXA ) 40 MG tablet Take 40 mg by mouth every evening. 08/08/20   [provider]  CREON  36000-114000 units CPEP capsule TAKE 2 CAPSULES BY MOUTH 3 TIMES A DAY WITH A MEAL. MAY ALSO TAKE 1 CAPSULE AS NEEDED WITH SNACKS 09/20/23   Lanny Callander, MD  hydrochlorothiazide  (HYDRODIURIL ) 12.5 MG tablet TAKE 1/2 TABLET BY MOUTH EVERY DAY 11/10/24   Burton, Lacie K, NP  KLOR-CON  M20 20 MEQ tablet TAKE 1 TABLET BY MOUTH TWICE A  DAY 07/21/24   Lanny Callander, MD  magnesium  oxide (MAG-OX) 400 (241.3 Mg) MG tablet Take 1 tablet (400 mg total) by mouth every evening. 10/28/23   Shelah Lamar RAMAN, MD  ondansetron  (ZOFRAN ) 8 MG tablet Take 1 tablet (8 mg total) by mouth every 8 (eight) hours as needed for nausea or vomiting. 08/21/24   Boscia, Heather E, NP  oxyCODONE  (OXY IR/ROXICODONE ) 5 MG immediate release tablet Take 1 tablet (5 mg total) by mouth every 6 (six) hours as needed for severe pain (pain score 7-10). 09/18/24   Boscia, Heather E, NP  pantoprazole  (PROTONIX ) 40 MG tablet Take 1 tablet (40 mg total) by mouth 2 (two) times daily before a meal. Twice daily for 2 months then once daily thereafter. 02/27/24 02/26/25  Mansouraty, Aloha Raddle., MD  potassium chloride  (KLOR-CON ) 10 MEQ tablet Take 1 tablet (10 mEq total) by mouth 2 (two) times daily. 03/24/24   Burton, Lacie K, NP  prochlorperazine  (COMPAZINE ) 10 MG tablet Take 1 tablet (10 mg total) by mouth every 6 (six) hours as needed for nausea or vomiting. 08/21/24   Boscia, Heather E, NP  simvastatin (ZOCOR) 80 MG tablet Take  80 mg by mouth every evening. 07/25/20   [provider]    Physical Exam: Vitals:   11/28/24 1450 11/28/24 1500 11/28/24 1922 11/28/24 1934  BP: 125/64 125/67 (!) 141/73   Pulse: 82 79 90   Resp: 18 16 17    Temp:  (!) 97.5 F (36.4 C) 97.7 F (36.5 C)   TempSrc:  Oral Oral   SpO2: 99% 95% 93%   Weight:    50.7 kg  Height:    5' 1 (1.549 m)   General: The patient is alert and oriented.  Frail-appearing.  She has a port in place for chemotherapy administration. HEENT: PERRLA, EOMI. CV: Regular rate and rhythm no murmurs Pulmonary: Lungs clear bilaterally GI: Soft, nontender MSK: Tenderness palpation over the right hip.  Right hand in brace. Neuro: Cranial nerves II through XII grossly intact Skin: Warm and dry Extremities: No pedal edema    Data Reviewed:  There are no new results to review at this time.  Assessment and Plan: No  notes have been filed under this hospital service. Service: Hospitalist  Right hip fracture-intertrochanteric fracture of the right hip with varus angulation The patient was advised to remain NPO after midnight in preparation for surgery.  A discussion was held regarding code status, and the patient confirmed she wishes to be full code, desiring all life-saving interventions including chest compressions, defibrillation, and ventilator support if necessary. - The plan is for surgical fixation of the hip fracture tomorrow at [facility]. Pain will be managed with appropriate medications until surgery. IV fluids will be administered while NPO. - IV pain meds and mIVF - NPO after mn - hold home medications until after surgery.  - PT/OT consult, likely will need SNF placement   Right wrist fracture Mildly impacted fracture of the distal right radial metaphysis - Nonoperative treatment. - Wearing brace.  Metastatic pancreatic cancer Patient sees Dr. Lanny.  Actively receiving treatment, last chemotherapy session was Thursday.  Port is in place.  Patient expresses desire to be full code.  Hypertension Hold HCTZ 6.25 until after surgery  Hyperlipidemia Hold simvastatin 80 mg until after surgery  Anxiety and depression Hold Celexa  40 mg and BuSpar  5 mg daily until after surgery  Pancreatic insufficiency secondary to pancreatic cancer Hold Creon  while n.p.o.  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    Advance Care Planning:   Code Status: Full Code discussed with pt  Consults: orthopedics  Family Communication: husband at bedside.  Husband, Jacqueline Tyler, is dedicated management consultant in event she cannot make decisions for herself.   Severity of Illness: The appropriate patient status for this patient is INPATIENT. Inpatient status is judged to be reasonable and necessary in order to provide the required intensity of  service to ensure the patient's safety. The patient's presenting symptoms, physical exam findings, and initial radiographic and laboratory data in the context of their chronic comorbidities is felt to place them at high risk for further clinical deterioration. Furthermore, it is not anticipated that the patient will be medically stable for discharge from the hospital within 2 midnights of admission.   * I certify that at the point of admission it is my clinical judgment that the patient will require inpatient hospital care spanning beyond 2 midnights from the point of admission due to high intensity of service, high risk for further deterioration and high frequency of surveillance required.*  Author: Toribio MARLA Slain, MD 11/28/2024  9:15 PM  For on call review [email or web address].      [1]  Allergies Allergen Reactions   Irinotecan  Liposome Nausea And Vomiting and Other (See Comments)    Patient c/o burning in throat, followed by N/V.  See Progress Note 01/09/24.   Tyloxapol Other (See Comments)    Nausea  Tylox    Oxycodone -Acetaminophen  Nausea Only    Patient was unsure / doesn't recall taking    Poison Ivy Extract Hives    Patient states HIGHLY allergice   "

## 2024-11-28 NOTE — ED Triage Notes (Signed)
 Pt arrived via ems from home with c/o fall today x 1 hour ago ,with c/o R. Hip pain, EMS denies deformity. Pt denies blood thinners ,undergoes chemo , tx q2weeks

## 2024-11-28 NOTE — H&P (View-Only) (Signed)
 Reason for Consult:right hip pain Referring Physician: ED  SHANA ZAVALETA is an 64 y.o. female.  HPI: 64 y.o female with a PMH HTN, DM, Stroke,Pancreatic cancer with metastatic disease presents to the ED via EMS s/p fall. Patient reports she was in her kitchen getting up to get coffee when her small dog was under her, she reports she fell and landed on the right side of her head. She is reporting severe pain to the right side of the hip exacerbated with any movement or palpation. She did not receive any medication EMS. Her right leg does appear shortened and externally rotated. She tells me she did not strike her head, there was no loss of consciousness. She is not on any blood thinners. Of note, patient is currently undergoing chemotherapy, last session was this past Thursday, rated intervention well.   Past Medical History:  Diagnosis Date   Anemia    Anxiety    Cancer (HCC) 07/2020   pancreatic cancer   Depression    Diabetes mellitus without complication (HCC) 07/2020   pancreatic cancer   High cholesterol    History of blood transfusion    History of hiatal hernia    small   Hypertension    Stroke [facility])    age 18, no residual effect    Past Surgical History:  Procedure Laterality Date   BILIARY BRUSHING  08/22/2020   Procedure: BILIARY BRUSHING;  Surgeon: Rosalie Kitchens, MD;  [facility];  Service: Endoscopy;;   BILIARY STENT PLACEMENT N/A 08/22/2020   Procedure: BILIARY STENT PLACEMENT;  Surgeon: Rosalie Kitchens, MD;  [facility];  Service: Endoscopy;  Laterality: N/A;   BIOPSY OF SKIN SUBCUTANEOUS TISSUE AND/OR MUCOUS MEMBRANE  02/27/2024   Procedure: BIOPSY, SKIN, SUBCUTANEOUS TISSUE, OR MUCOUS MEMBRANE;  Surgeon: Mansouraty, Aloha Raddle., MD;  [facility];  Service: Gastroenterology;;   BRONCHIAL BIOPSY  10/28/2023   Procedure: BRONCHIAL BIOPSIES;  Surgeon: Shelah Lamar RAMAN, MD;  Location: [facility] ENDOSCOPY;  Service: Pulmonary;;   BRONCHIAL BRUSHINGS  10/28/2023    Procedure: BRONCHIAL BRUSHINGS;  Surgeon: Shelah Lamar RAMAN, MD;  Location: [facility] ENDOSCOPY;  Service: Pulmonary;;   BRONCHIAL NEEDLE ASPIRATION BIOPSY  10/28/2023   Procedure: BRONCHIAL NEEDLE ASPIRATION BIOPSIES;  Surgeon: Shelah Lamar RAMAN, MD;  Location: [facility] ENDOSCOPY;  Service: Pulmonary;;   ENDOSCOPIC RETROGRADE CHOLANGIOPANCREATOGRAPHY (ERCP) WITH PROPOFOL  N/A 08/22/2020   Procedure: ENDOSCOPIC RETROGRADE CHOLANGIOPANCREATOGRAPHY (ERCP) WITH PROPOFOL ;  Surgeon: Rosalie Kitchens, MD;  [facility];  Service: Endoscopy;  Laterality: N/A;   ESOPHAGOGASTRODUODENOSCOPY (EGD) WITH PROPOFOL  N/A 08/24/2020   Procedure: ESOPHAGOGASTRODUODENOSCOPY (EGD) WITH PROPOFOL ;  Surgeon: Burnette Fallow, MD;  [facility];  Service: Endoscopy;  Laterality: N/A;   EUS N/A 02/27/2024   Procedure: ULTRASOUND, UPPER GI TRACT, ENDOSCOPIC;  Surgeon: Wilhelmenia Aloha Raddle., MD;  [facility];  Service: Gastroenterology;  Laterality: N/A;   FIDUCIAL MARKER PLACEMENT  10/28/2023   Procedure: FIDUCIAL MARKER PLACEMENT;  Surgeon: Shelah Lamar RAMAN, MD;  Location: [facility] ENDOSCOPY;  Service: Pulmonary;;   FIDUCIAL MARKER PLACEMENT  02/27/2024   Procedure: INSERTION, FIDUCIAL MARKERS;  Surgeon: Wilhelmenia Aloha Raddle., MD;  Location: [facility];  Service: Gastroenterology;;   FINE NEEDLE ASPIRATION N/A 08/24/2020   Procedure: FINE NEEDLE ASPIRATION (FNA) LINEAR;  Surgeon: Burnette Fallow, MD;  [facility];  Service: Endoscopy;  Laterality: N/A;   PORT-A-CATH REMOVAL N/A 12/08/2021   Procedure: REMOVAL PORT-A-CATH;  Surgeon: Dasie Leonor LITTIE, MD;  Location: [facility] OR;  Service: General;  Laterality: N/A;  PORTACATH PLACEMENT Right 09/08/2020   Procedure: INSERTION PORT-A-CATH;  Surgeon: Dasie Leonor CROME, MD;  Location: [facility];  Service: General;  Laterality: Right;   PORTACATH PLACEMENT N/A 11/06/2023   Procedure: INSERTION PORT-A-CATH;  Surgeon: Dasie Leonor CROME, MD;  [facility];   Service: General;  Laterality: N/A;  LMA   SPHINCTEROTOMY  08/22/2020   Procedure: ANNETT;  Surgeon: Rosalie Kitchens, MD;  [facility];  Service: Endoscopy;;   UPPER ESOPHAGEAL ENDOSCOPIC ULTRASOUND (EUS) N/A 08/24/2020   Procedure: UPPER ESOPHAGEAL ENDOSCOPIC ULTRASOUND (EUS);  Surgeon: Burnette Fallow, MD;  Location: [facility];  Service: Endoscopy;  Laterality: N/A;    Family History  Problem Relation Age of Onset   CAD Mother    Cancer Cousin        paternal cousin; unknown type; dx >50   Other Cousin        paternal cousin; brain tumor    Social History:  reports that she has never smoked. She has never used smokeless tobacco. She reports that she does not drink alcohol and does not use drugs.  Allergies: Allergies[1]  Medications: I have reviewed the patient's current medications.  Results for orders placed or performed during the hospital encounter of 11/28/24 (from the past 48 hours)  CBC with Differential     Status: Abnormal   Collection Time: 11/28/24  6:28 PM  Result Value Ref Range   WBC 13.8 (H) 4.0 - 10.5 K/uL   RBC 2.72 (L) 3.87 - 5.11 MIL/uL   Hemoglobin 9.0 (L) 12.0 - 15.0 g/dL   HCT 72.5 (L) 63.9 - 53.9 %   MCV 100.7 (H) 80.0 - 100.0 fL   MCH 33.1 26.0 - 34.0 pg   MCHC 32.8 30.0 - 36.0 g/dL   RDW 85.0 88.4 - 84.4 %   Platelets 284 150 - 400 K/uL   nRBC 0.0 0.0 - 0.2 %   Neutrophils Relative % 97 %   Neutro Abs 13.5 (H) 1.7 - 7.7 K/uL   Lymphocytes Relative 1 %   Lymphs Abs 0.1 (L) 0.7 - 4.0 K/uL   Monocytes Relative 1 %   Monocytes Absolute 0.1 0.1 - 1.0 K/uL   Eosinophils Relative 0 %   Eosinophils Absolute 0.0 0.0 - 0.5 K/uL   Basophils Relative 0 %   Basophils Absolute 0.0 0.0 - 0.1 K/uL   Immature Granulocytes 1 %   Abs Immature Granulocytes 0.08 (H) 0.00 - 0.07 K/uL    Comment: Performed at [facility], [address] [place], [place] [address]    DG Wrist Complete Right Result Date: 11/28/2024 EXAM: 3 OR  MORE VIEW(S) XRAY OF THE WRIST 11/28/2024 05:19:16 PM COMPARISON: None available. CLINICAL HISTORY: fall FINDINGS: BONES AND JOINTS: Mildly impacted fracture of distal radial metaphysis. Neutral tilt of the distal radial articular surface. No dislocation. No malalignment. Mild osteopenia. SOFT TISSUES: Mild soft tissue swelling. IMPRESSION: 1. Mildly impacted fracture of the distal radial metaphysis with neutral tilt of the distal radial articular surface. No dislocation. 2. Mild soft tissue swelling. Electronically signed by: Dorethia Molt MD MD 11/28/2024 05:38 PM EST RP Workstation: [id number]   DG Hip Unilat  With Pelvis 2-3 Views Right Result Date: 11/28/2024 EXAM: 2 OR MORE VIEW(S) XRAY OF THE RIGHT HIP 11/28/2024 05:19:16 PM COMPARISON: None available. CLINICAL HISTORY: severe pain per pt, fall FINDINGS: BONES AND JOINTS: Intertrochanteric fracture of the right hip with varus angulation. SOFT TISSUES: Large rectal stool ball. IMPRESSION: 1. Intertrochanteric fracture of  the right hip with varus angulation. 2. Large rectal stool ball. Electronically signed by: Dorethia Molt MD MD 11/28/2024 05:37 PM EST RP Workstation: [id number]    Review of Systems  Constitutional:  Negative for chills and fever.  Respiratory:  Negative for cough, shortness of breath and wheezing.   Cardiovascular:  Negative for chest pain, palpitations and leg swelling.  Musculoskeletal:  Positive for arthralgias and gait problem.  Neurological:  Negative for dizziness.  Psychiatric/Behavioral:  Negative for agitation, behavioral problems and confusion.    Blood pressure 125/67, pulse 79, temperature (!) 97.5 F (36.4 C), temperature source Oral, resp. rate 16, SpO2 95%. Physical Exam Constitutional:      General: She is not in acute distress.    Appearance: Normal appearance. She is normal weight.  HENT:     Head: Normocephalic and atraumatic.  Eyes:     Extraocular Movements: Extraocular movements intact.   Cardiovascular:     Pulses: Normal pulses.  Pulmonary:     Effort: Pulmonary effort is normal. No respiratory distress.  Musculoskeletal:     Comments: No open wounds of abrasions about the right hip. Tender to palpation about the right hip. No tenderness about the right knee. Calves soft and nontender. Right leg slightly shortened and externally rotated. Distally neurovascularly intact.   Skin:    Capillary Refill: Capillary refill takes less than 2 seconds.  Neurological:     Mental Status: She is alert and oriented to person, place, and time. Mental status is at baseline.  Psychiatric:        Mood and Affect: Mood normal.        Behavior: Behavior normal.     Assessment/Plan: Right intertrochanteric femur fracture Patient will need intramedullary nail for right hip fracture. Discussed the case with the patient and her husband including risks, benefits, and expected post op course. Plan for transfer to [facility] for surgery with Dr Dozier tomorrow. Patient will be admitted to medicine service at [facility]. Patient will likely be able to be discharged home when the time comes, as she has excellent support at home. It appears that the patient also has a distal radius fracture on the right which has been called out to hand call. It does not appear surgical to me but will defer that call. Patient will likely need platform walker for this reason.   Doroteo Nickolson L. [place], PA-C 11/28/2024, 7:04 PM         [1]  Allergies Allergen Reactions   Irinotecan  Liposome Nausea And Vomiting and Other (See Comments)    Patient c/o burning in throat, followed by N/V.  See Progress Note 01/09/24.   Tyloxapol Other (See Comments)    Nausea  Tylox    Oxycodone -Acetaminophen  Nausea Only    Patient was unsure / doesn't recall taking    Poison Ivy Extract Hives    Patient states HIGHLY allergice

## 2024-11-28 NOTE — Consult Note (Signed)
 Reason for Consult:right hip pain Referring Physician: ED  Jacqueline Tyler is an 64 y.o. female.  HPI: 64 y.o female with a PMH HTN, DM, Stroke,Pancreatic cancer with metastatic disease presents to the ED via EMS s/p fall. Patient reports she was in her kitchen getting up to get coffee when her small dog was under her, she reports she fell and landed on the right side of her head. She is reporting severe pain to the right side of the hip exacerbated with any movement or palpation. She did not receive any medication EMS. Her right leg does appear shortened and externally rotated. She tells me she did not strike her head, there was no loss of consciousness. She is not on any blood thinners. Of note, patient is currently undergoing chemotherapy, last session was this past Thursday, rated intervention well.   Past Medical History:  Diagnosis Date   Anemia    Anxiety    Cancer (HCC) 07/2020   pancreatic cancer   Depression    Diabetes mellitus without complication (HCC) 07/2020   pancreatic cancer   High cholesterol    History of blood transfusion    History of hiatal hernia    small   Hypertension    Stroke [facility])    age 18, no residual effect    Past Surgical History:  Procedure Laterality Date   BILIARY BRUSHING  08/22/2020   Procedure: BILIARY BRUSHING;  Surgeon: Rosalie Kitchens, MD;  Location: [facility] ENDOSCOPY;  Service: Endoscopy;;   BILIARY STENT PLACEMENT N/A 08/22/2020   Procedure: BILIARY STENT PLACEMENT;  Surgeon: Rosalie Kitchens, MD;  Location: [facility] ENDOSCOPY;  Service: Endoscopy;  Laterality: N/A;   BIOPSY OF SKIN SUBCUTANEOUS TISSUE AND/OR MUCOUS MEMBRANE  02/27/2024   Procedure: BIOPSY, SKIN, SUBCUTANEOUS TISSUE, OR MUCOUS MEMBRANE;  Surgeon: Mansouraty, Aloha Raddle., MD;  Location: [facility] ENDOSCOPY;  Service: Gastroenterology;;   BRONCHIAL BIOPSY  10/28/2023   Procedure: BRONCHIAL BIOPSIES;  Surgeon: Shelah Lamar RAMAN, MD;  Location: [facility] ENDOSCOPY;  Service: Pulmonary;;   BRONCHIAL BRUSHINGS  10/28/2023    Procedure: BRONCHIAL BRUSHINGS;  Surgeon: Shelah Lamar RAMAN, MD;  Location: [facility] ENDOSCOPY;  Service: Pulmonary;;   BRONCHIAL NEEDLE ASPIRATION BIOPSY  10/28/2023   Procedure: BRONCHIAL NEEDLE ASPIRATION BIOPSIES;  Surgeon: Shelah Lamar RAMAN, MD;  Location: [facility] ENDOSCOPY;  Service: Pulmonary;;   ENDOSCOPIC RETROGRADE CHOLANGIOPANCREATOGRAPHY (ERCP) WITH PROPOFOL  N/A 08/22/2020   Procedure: ENDOSCOPIC RETROGRADE CHOLANGIOPANCREATOGRAPHY (ERCP) WITH PROPOFOL ;  Surgeon: Rosalie Kitchens, MD;  Location: [facility];  Service: Endoscopy;  Laterality: N/A;   ESOPHAGOGASTRODUODENOSCOPY (EGD) WITH PROPOFOL  N/A 08/24/2020   Procedure: ESOPHAGOGASTRODUODENOSCOPY (EGD) WITH PROPOFOL ;  Surgeon: Burnette Fallow, MD;  Location: [facility];  Service: Endoscopy;  Laterality: N/A;   EUS N/A 02/27/2024   Procedure: ULTRASOUND, UPPER GI TRACT, ENDOSCOPIC;  Surgeon: Wilhelmenia Aloha Raddle., MD;  Location: [facility];  Service: Gastroenterology;  Laterality: N/A;   FIDUCIAL MARKER PLACEMENT  10/28/2023   Procedure: FIDUCIAL MARKER PLACEMENT;  Surgeon: Shelah Lamar RAMAN, MD;  Location: [facility] ENDOSCOPY;  Service: Pulmonary;;   FIDUCIAL MARKER PLACEMENT  02/27/2024   Procedure: INSERTION, FIDUCIAL MARKERS;  Surgeon: Wilhelmenia Aloha Raddle., MD;  Location: [facility] ENDOSCOPY;  Service: Gastroenterology;;   FINE NEEDLE ASPIRATION N/A 08/24/2020   Procedure: FINE NEEDLE ASPIRATION (FNA) LINEAR;  Surgeon: Burnette Fallow, MD;  Location: [facility];  Service: Endoscopy;  Laterality: N/A;   PORT-A-CATH REMOVAL N/A 12/08/2021   Procedure: REMOVAL PORT-A-CATH;  Surgeon: Dasie Leonor LITTIE, MD;  Location: [facility] OR;  Service: General;  Laterality: N/A;  PORTACATH PLACEMENT Right 09/08/2020   Procedure: INSERTION PORT-A-CATH;  Surgeon: Dasie Leonor CROME, MD;  Location: [facility];  Service: General;  Laterality: Right;   PORTACATH PLACEMENT N/A 11/06/2023   Procedure: INSERTION PORT-A-CATH;  Surgeon: Dasie Leonor CROME, MD;  Location: [facility] ORS;   Service: General;  Laterality: N/A;  LMA   SPHINCTEROTOMY  08/22/2020   Procedure: ANNETT;  Surgeon: Rosalie Kitchens, MD;  Location: [facility]OS[facility];  Service: Endoscopy;;   UPPER ESOPHAGEAL ENDOSCOPIC ULTRASOUND (EUS) N/A 08/24/2020   Procedure: UPPER ESOPHAGEAL ENDOSCOPIC ULTRASOUND (EUS);  Surgeon: Burnette Fallow, MD;  Location: [facility];  Service: Endoscopy;  Laterality: N/A;    Family History  Problem Relation Age of Onset   CAD Mother    Cancer Cousin        paternal cousin; unknown type; dx >50   Other Cousin        paternal cousin; brain tumor    Social History:  reports that she has never smoked. She has never used smokeless tobacco. She reports that she does not drink alcohol and does not use drugs.  Allergies: Allergies[1]  Medications: I have reviewed the patient's current medications.  Results for orders placed or performed during the hospital encounter of 11/28/24 (from the past 48 hours)  CBC with Differential     Status: Abnormal   Collection Time: 11/28/24  6:28 PM  Result Value Ref Range   WBC 13.8 (H) 4.0 - 10.5 K/uL   RBC 2.72 (L) 3.87 - 5.11 MIL/uL   Hemoglobin 9.0 (L) 12.0 - 15.0 g/dL   HCT 72.5 (L) 63.9 - 53.9 %   MCV 100.7 (H) 80.0 - 100.0 fL   MCH 33.1 26.0 - 34.0 pg   MCHC 32.8 30.0 - 36.0 g/dL   RDW 85.0 88.4 - 84.4 %   Platelets 284 150 - 400 K/uL   nRBC 0.0 0.0 - 0.2 %   Neutrophils Relative % 97 %   Neutro Abs 13.5 (H) 1.7 - 7.7 K/uL   Lymphocytes Relative 1 %   Lymphs Abs 0.1 (L) 0.7 - 4.0 K/uL   Monocytes Relative 1 %   Monocytes Absolute 0.1 0.1 - 1.0 K/uL   Eosinophils Relative 0 %   Eosinophils Absolute 0.0 0.0 - 0.5 K/uL   Basophils Relative 0 %   Basophils Absolute 0.0 0.0 - 0.1 K/uL   Immature Granulocytes 1 %   Abs Immature Granulocytes 0.08 (H) 0.00 - 0.07 K/uL    Comment: Performed at [facility], [address] [place], [place] [address]    DG Wrist Complete Right Result Date: 11/28/2024 EXAM: 3 OR  MORE VIEW(S) XRAY OF THE WRIST 11/28/2024 05:19:16 PM COMPARISON: None available. CLINICAL HISTORY: fall FINDINGS: BONES AND JOINTS: Mildly impacted fracture of distal radial metaphysis. Neutral tilt of the distal radial articular surface. No dislocation. No malalignment. Mild osteopenia. SOFT TISSUES: Mild soft tissue swelling. IMPRESSION: 1. Mildly impacted fracture of the distal radial metaphysis with neutral tilt of the distal radial articular surface. No dislocation. 2. Mild soft tissue swelling. Electronically signed by: Dorethia Molt MD MD 11/28/2024 05:38 PM EST RP Workstation: [id number]   DG Hip Unilat  With Pelvis 2-3 Views Right Result Date: 11/28/2024 EXAM: 2 OR MORE VIEW(S) XRAY OF THE RIGHT HIP 11/28/2024 05:19:16 PM COMPARISON: None available. CLINICAL HISTORY: severe pain per pt, fall FINDINGS: BONES AND JOINTS: Intertrochanteric fracture of the right hip with varus angulation. SOFT TISSUES: Large rectal stool ball. IMPRESSION: 1. Intertrochanteric fracture of  the right hip with varus angulation. 2. Large rectal stool ball. Electronically signed by: Dorethia Molt MD MD 11/28/2024 05:37 PM EST RP Workstation: [id number]    Review of Systems  Constitutional:  Negative for chills and fever.  Respiratory:  Negative for cough, shortness of breath and wheezing.   Cardiovascular:  Negative for chest pain, palpitations and leg swelling.  Musculoskeletal:  Positive for arthralgias and gait problem.  Neurological:  Negative for dizziness.  Psychiatric/Behavioral:  Negative for agitation, behavioral problems and confusion.    Blood pressure 125/67, pulse 79, temperature (!) 97.5 F (36.4 C), temperature source Oral, resp. rate 16, SpO2 95%. Physical Exam Constitutional:      General: She is not in acute distress.    Appearance: Normal appearance. She is normal weight.  HENT:     Head: Normocephalic and atraumatic.  Eyes:     Extraocular Movements: Extraocular movements intact.   Cardiovascular:     Pulses: Normal pulses.  Pulmonary:     Effort: Pulmonary effort is normal. No respiratory distress.  Musculoskeletal:     Comments: No open wounds of abrasions about the right hip. Tender to palpation about the right hip. No tenderness about the right knee. Calves soft and nontender. Right leg slightly shortened and externally rotated. Distally neurovascularly intact.   Skin:    Capillary Refill: Capillary refill takes less than 2 seconds.  Neurological:     Mental Status: She is alert and oriented to person, place, and time. Mental status is at baseline.  Psychiatric:        Mood and Affect: Mood normal.        Behavior: Behavior normal.     Assessment/Plan: Right intertrochanteric femur fracture Patient will need intramedullary nail for right hip fracture. Discussed the case with the patient and her husband including risks, benefits, and expected post op course. Plan for transfer to [facility] for surgery with Dr Dozier tomorrow. Patient will be admitted to medicine service at [facility]. Patient will likely be able to be discharged home when the time comes, as she has excellent support at home. It appears that the patient also has a distal radius fracture on the right which has been called out to hand call. It does not appear surgical to me but will defer that call. Patient will likely need platform walker for this reason.   Doroteo Nickolson L. [place], PA-C 11/28/2024, 7:04 PM         [1]  Allergies Allergen Reactions   Irinotecan  Liposome Nausea And Vomiting and Other (See Comments)    Patient c/o burning in throat, followed by N/V.  See Progress Note 01/09/24.   Tyloxapol Other (See Comments)    Nausea  Tylox    Oxycodone -Acetaminophen  Nausea Only    Patient was unsure / doesn't recall taking    Poison Ivy Extract Hives    Patient states HIGHLY allergice

## 2024-11-28 NOTE — ED Provider Notes (Signed)
 " [place] EMERGENCY DEPARTMENT AT [facility] Provider Note   CSN: [id number] Arrival date & time: 11/28/24  1441     Patient presents with: Fall and Hip Pain   Jacqueline Tyler is a 64 y.o. female.   64 y.o female with a PMH HTN, DM, Stroke,Pancreatic cancer with metastatic disease presents to the ED via EMS s/p fall. Patient reports she was in her kitchen getting up to get coffee when her small dog was under her, she reports she fell and landed on the right side of her head.  She is reporting severe pain to the right side of the hip exacerbated with any movement or palpation.  She did not receive any medication EMS.  Her right leg does appear shortened and externally rotated.  She tells me she did not strike her head, there was no loss of consciousness.  She is not on any blood thinners.  Of note, patient is currently undergoing chemotherapy, last session was this past Thursday, rated intervention well.  The history is provided by the patient.  Fall This is a new problem. The current episode started 1 to 2 hours ago. The problem occurs constantly. The problem has not changed since onset.Pertinent negatives include no chest pain, no abdominal pain and no shortness of breath. Nothing relieves the symptoms. She has tried nothing for the symptoms.  Hip Pain Pertinent negatives include no chest pain, no abdominal pain and no shortness of breath.       Prior to Admission medications  Medication Sig Start Date End Date Taking? Authorizing Provider  acetaminophen  (TYLENOL ) 325 MG tablet Take 650 mg by mouth every 6 (six) hours as needed for moderate pain (pain score 4-6).    [provider]  busPIRone  (BUSPAR ) 5 MG tablet Take 5 mg by mouth daily.    [provider]  citalopram  (CELEXA ) 40 MG tablet Take 40 mg by mouth every evening. 08/08/20   [provider]  CREON  36000-114000 units CPEP capsule TAKE 2 CAPSULES BY MOUTH 3 TIMES A DAY WITH A MEAL. MAY ALSO  TAKE 1 CAPSULE AS NEEDED WITH SNACKS 09/20/23   Lanny Callander, MD  hydrochlorothiazide  (HYDRODIURIL ) 12.5 MG tablet TAKE 1/2 TABLET BY MOUTH EVERY DAY 11/10/24   Burton, Lacie K, NP  KLOR-CON  M20 20 MEQ tablet TAKE 1 TABLET BY MOUTH TWICE A DAY 07/21/24   Lanny Callander, MD  magnesium  oxide (MAG-OX) 400 (241.3 Mg) MG tablet Take 1 tablet (400 mg total) by mouth every evening. 10/28/23   Shelah Lamar RAMAN, MD  ondansetron  (ZOFRAN ) 8 MG tablet Take 1 tablet (8 mg total) by mouth every 8 (eight) hours as needed for nausea or vomiting. 08/21/24   Boscia, Heather E, NP  oxyCODONE  (OXY IR/ROXICODONE ) 5 MG immediate release tablet Take 1 tablet (5 mg total) by mouth every 6 (six) hours as needed for severe pain (pain score 7-10). 09/18/24   Boscia, Heather E, NP  pantoprazole  (PROTONIX ) 40 MG tablet Take 1 tablet (40 mg total) by mouth 2 (two) times daily before a meal. Twice daily for 2 months then once daily thereafter. 02/27/24 02/26/25  Mansouraty, Aloha Raddle., MD  potassium chloride  (KLOR-CON ) 10 MEQ tablet Take 1 tablet (10 mEq total) by mouth 2 (two) times daily. 03/24/24   Burton, Lacie K, NP  prochlorperazine  (COMPAZINE ) 10 MG tablet Take 1 tablet (10 mg total) by mouth every 6 (six) hours as needed for nausea or vomiting. 08/21/24   Boscia, Heather E, NP  simvastatin (ZOCOR) 80 MG tablet Take 80 mg by mouth every evening. 07/25/20   [provider]    Allergies: Irinotecan  liposome, Tyloxapol, Oxycodone -acetaminophen , and Poison ivy extract    Review of Systems  Constitutional:  Negative for fever.  HENT:  Negative for sore throat.   Respiratory:  Negative for shortness of breath.   Cardiovascular:  Negative for chest pain.  Gastrointestinal:  Negative for abdominal pain, diarrhea, nausea and vomiting.  Genitourinary:  Negative for flank pain.  Musculoskeletal:  Negative for back pain.  Skin:  Negative for pallor and wound.  Neurological:  Negative for seizures and numbness.  All other systems reviewed  and are negative.   Updated Vital Signs BP (!) 141/73 (BP Location: Right Arm)   Pulse 90   Temp 97.7 F (36.5 C) (Oral)   Resp 17   Ht 5' 1 (1.549 m)   Wt 50.7 kg   SpO2 93%   BMI 21.12 kg/m   Physical Exam Vitals and nursing note reviewed.  Constitutional:      Comments: frail  HENT:     Head: Normocephalic.     Comments: No visible sign of trauma, no bruising or obvious deformity.     Nose: Nose normal.     Mouth/Throat:     Mouth: Mucous membranes are dry.  Eyes:     Pupils: Pupils are equal, round, and reactive to light.  Cardiovascular:     Rate and Rhythm: Normal rate.     Pulses:          Dorsalis pedis pulses are 2+ on the right side and 2+ on the left side.       Posterior tibial pulses are 2+ on the right side and 2+ on the left side.  Pulmonary:     Effort: Pulmonary effort is normal.     Breath sounds: No wheezing.  Abdominal:     General: Abdomen is flat.     Palpations: Abdomen is soft.  Musculoskeletal:        General: Tenderness, deformity and signs of injury present.     Cervical back: Normal range of motion and neck supple.     Right hip: Deformity and bony tenderness present. Decreased range of motion. Decreased strength.     Comments: Right leg is shortened and externally rotated. Decrease strength, and limited ROM with pain.   Skin:    General: Skin is warm and dry.  Neurological:     Mental Status: She is alert and oriented to person, place, and time.     (all labs ordered are listed, but only abnormal results are displayed) Labs Reviewed  CBC WITH DIFFERENTIAL/PLATELET - Abnormal; Notable for the following components:      Result Value   WBC 13.8 (*)    RBC 2.72 (*)    Hemoglobin 9.0 (*)    HCT 27.4 (*)    MCV 100.7 (*)    Neutro Abs 13.5 (*)    Lymphs Abs 0.1 (*)    Abs Immature Granulocytes 0.08 (*)    All other components within normal limits  COMPREHENSIVE METABOLIC PANEL WITH GFR - Abnormal; Notable for the following  components:   Sodium 134 (*)    Glucose, Bld 122 (*)    Calcium  8.5 (*)    Total Protein 6.3 (*)    Albumin 3.1 (*)    AST 54 (*)    Alkaline Phosphatase 218 (*)    All other components within normal limits  EKG: None  Radiology: DG Wrist Complete Right Result Date: 11/28/2024 EXAM: 3 OR MORE VIEW(S) XRAY OF THE WRIST 11/28/2024 05:19:16 PM COMPARISON: None available. CLINICAL HISTORY: fall FINDINGS: BONES AND JOINTS: Mildly impacted fracture of distal radial metaphysis. Neutral tilt of the distal radial articular surface. No dislocation. No malalignment. Mild osteopenia. SOFT TISSUES: Mild soft tissue swelling. IMPRESSION: 1. Mildly impacted fracture of the distal radial metaphysis with neutral tilt of the distal radial articular surface. No dislocation. 2. Mild soft tissue swelling. Electronically signed by: Dorethia Molt MD MD 11/28/2024 05:38 PM EST RP Workstation: [id number]   DG Hip Unilat  With Pelvis 2-3 Views Right Result Date: 11/28/2024 EXAM: 2 OR MORE VIEW(S) XRAY OF THE RIGHT HIP 11/28/2024 05:19:16 PM COMPARISON: None available. CLINICAL HISTORY: severe pain per pt, fall FINDINGS: BONES AND JOINTS: Intertrochanteric fracture of the right hip with varus angulation. SOFT TISSUES: Large rectal stool ball. IMPRESSION: 1. Intertrochanteric fracture of the right hip with varus angulation. 2. Large rectal stool ball. Electronically signed by: Dorethia Molt MD MD 11/28/2024 05:37 PM EST RP Workstation: [id number]     Procedures   Medications Ordered in the ED  fentaNYL  (SUBLIMAZE ) injection 50 mcg (50 mcg Intravenous Given 11/28/24 1647)  ondansetron  (ZOFRAN ) injection 4 mg (4 mg Intravenous Given 11/28/24 1645)  fentaNYL  (SUBLIMAZE ) injection 50 mcg (50 mcg Intravenous Given 11/28/24 1825)  diazepam  (VALIUM ) tablet 2 mg (2 mg Oral Given 11/28/24 1825)  morphine  (PF) 4 MG/ML injection 4 mg (4 mg Intravenous Given 11/28/24 1953)                                    Medical  Decision Making Amount and/or Complexity of Data Reviewed Labs: ordered. Radiology: ordered.  Risk Prescription drug management. Decision regarding hospitalization.   This patient presents to the ED for concern of right hip pain, this involves a number of treatment options, and is a complaint that carries with it a high risk of complications and morbidity.  The differential diagnosis includes fracture versus dislocation.    Co morbidities: Discussed in HPI   Brief History:  See HPI.   EMR reviewed including pt PMHx, past surgical history and past visits to ER.   See HPI for more details   Lab Tests:  I ordered and independently interpreted labs.  The pertinent results include:    CMP with slight elevation of her alk phos, does have underlying metastatic disease from a primary head pancreatic cancer.  Slight elevation on her white count, hemoglobin is at her baseline.   Imaging Studies:  Dg wrist showed: IMPRESSION:  1. Mildly impacted fracture of the distal radial metaphysis with neutral tilt  of the distal radial articular surface. No dislocation.  2. Mild soft tissue swelling.   DG hip showed: IMPRESSION:  1. Intertrochanteric fracture of the right hip with varus angulation.  2. Large rectal stool ball.   Medicines ordered:  I ordered medication including fentanyl   for pain control Reevaluation of the patient after these medicines showed that the patient improved I have reviewed the patients home medicines and have made adjustments as needed  Consults:  I requested consultation with Orthopedics Dr. Dozier,  and discussed lab and imaging findings as well as pertinent plan - they recommend: N.p.o. after midnight, will have surgery tomorrow morning.  Reevaluation:  After the interventions noted above I re-evaluated patient and found that they have :improved  Social Determinants  of Health:  The patient's social determinants of health were a factor in the  care of this patient  Problem List / ED Course:  Patient presented to the ED status post mechanical fall this morning, reports she tripped on her small dog and landed on the right hip, endorsing pain to the area has not been able to ambulate since.  Obvious deformity noted with a shortened leg and externally rotated.  Given fentanyl  to help with pain control, multiple images obtained which show a intertrochanteric right hip fracture, she is neurovascularly intact.  Wrist x-ray also shows a fracture we will put her in a brace that she likely will need PT for her right hip fracture. Blood work obtained which did not show any acute findings aside from some irregular elevation of her alk phos.  Did discuss this case with Dr. Dozier, he did tell me that patient will need to be n.p.o. after midnight.  Given multiple doses of pain medicine at this time.  Discussed the case with her family member at the bedside who is agreeable to plan and treatment at this time. Of note, patient is currently undergoing chemotherapy with her last session on Thursday. Spoke to hospitalist service Dr. Chandra, who will admit patient for further management.  Dispostion:  After consideration of the diagnostic results and the patients response to treatment, I feel that the patent would benefit from admission.    Portions of this note were generated with Scientist, clinical (histocompatibility and immunogenetics). Dictation errors may occur despite best attempts at proofreading.   Final diagnoses:  Fall, initial encounter  Closed fracture of distal end of right radius, unspecified fracture morphology, initial encounter  Closed fracture of right hip, initial encounter [facility])    ED Discharge Orders     None          Maureen Broad, DEVONNA 11/28/24 2007    Franklyn Sid SAILOR, MD 11/28/24 2025  "

## 2024-11-28 NOTE — ED Notes (Signed)
 Pt placed on 2 lpm O2 [place] after fentanyl  administration

## 2024-11-29 ENCOUNTER — Inpatient Hospital Stay (HOSPITAL_COMMUNITY)

## 2024-11-29 ENCOUNTER — Encounter (HOSPITAL_COMMUNITY)
Admission: EM | Disposition: A | Discharge status: Home Health | Payer: Self-pay | Source: Home / Self Care | Attending: Internal Medicine

## 2024-11-29 ENCOUNTER — Encounter (HOSPITAL_COMMUNITY): Payer: Self-pay | Admitting: Family Medicine

## 2024-11-29 ENCOUNTER — Inpatient Hospital Stay (HOSPITAL_COMMUNITY): Admitting: Certified Registered"

## 2024-11-29 HISTORY — PX: INTRAMEDULLARY (IM) NAIL INTERTROCHANTERIC: SHX5875

## 2024-11-29 LAB — COMPREHENSIVE METABOLIC PANEL WITH GFR
ALT: 33 U/L (ref 0–44)
AST: 45 U/L — ABNORMAL HIGH (ref 15–41)
Albumin: 2.8 g/dL — ABNORMAL LOW (ref 3.5–5.0)
Alkaline Phosphatase: 166 U/L — ABNORMAL HIGH (ref 38–126)
Anion gap: 10 (ref 5–15)
BUN: 15 mg/dL (ref 8–23)
CO2: 24 mmol/L (ref 22–32)
Calcium: 8.3 mg/dL — ABNORMAL LOW (ref 8.9–10.3)
Chloride: 97 mmol/L — ABNORMAL LOW (ref 98–111)
Creatinine, Ser: 0.88 mg/dL (ref 0.44–1.00)
GFR, Estimated: >60 mL/min
Glucose, Bld: 139 mg/dL — ABNORMAL HIGH (ref 70–99)
Potassium: 4.4 mmol/L (ref 3.5–5.1)
Sodium: 131 mmol/L — ABNORMAL LOW (ref 135–145)
Total Bilirubin: 0.8 mg/dL (ref 0.0–1.2)
Total Protein: 5.8 g/dL — ABNORMAL LOW (ref 6.5–8.1)

## 2024-11-29 LAB — GLUCOSE, CAPILLARY
Glucose-Capillary: 112 mg/dL — ABNORMAL HIGH (ref 70–99)
Glucose-Capillary: 120 mg/dL — ABNORMAL HIGH (ref 70–99)
Glucose-Capillary: 123 mg/dL — ABNORMAL HIGH (ref 70–99)
Glucose-Capillary: 134 mg/dL — ABNORMAL HIGH (ref 70–99)
Glucose-Capillary: 194 mg/dL — ABNORMAL HIGH (ref 70–99)
Glucose-Capillary: 197 mg/dL — ABNORMAL HIGH (ref 70–99)
Glucose-Capillary: 203 mg/dL — ABNORMAL HIGH (ref 70–99)
Glucose-Capillary: 210 mg/dL — ABNORMAL HIGH (ref 70–99)
Glucose-Capillary: 80 mg/dL (ref 70–99)

## 2024-11-29 LAB — HEMOGLOBIN A1C
Hgb A1c MFr Bld: 4.8 % (ref 4.8–5.6)
Mean Plasma Glucose: 91.06 mg/dL

## 2024-11-29 LAB — CBC
HCT: 25.1 % — ABNORMAL LOW (ref 36.0–46.0)
Hemoglobin: 8.3 g/dL — ABNORMAL LOW (ref 12.0–15.0)
MCH: 33.3 pg (ref 26.0–34.0)
MCHC: 33.1 g/dL (ref 30.0–36.0)
MCV: 100.8 fL — ABNORMAL HIGH (ref 80.0–100.0)
Platelets: 260 K/uL (ref 150–400)
RBC: 2.49 MIL/uL — ABNORMAL LOW (ref 3.87–5.11)
RDW: 14.8 % (ref 11.5–15.5)
WBC: 10.7 K/uL — ABNORMAL HIGH (ref 4.0–10.5)
nRBC: 0 % (ref 0.0–0.2)

## 2024-11-29 LAB — SURGICAL PCR SCREEN
MRSA, PCR: NEGATIVE
Staphylococcus aureus: NEGATIVE

## 2024-11-29 LAB — PROTIME-INR
INR: 1.1 (ref 0.8–1.2)
Prothrombin Time: 15 s (ref 11.4–15.2)

## 2024-11-29 LAB — HIV ANTIBODY (ROUTINE TESTING W REFLEX): HIV Screen 4th Generation wRfx: NONREACTIVE

## 2024-11-29 MED ORDER — ONDANSETRON HCL 4 MG/2ML IJ SOLN
INTRAMUSCULAR | Status: DC | PRN
  Administered 2024-11-29 (×2): 4 mg via INTRAVENOUS

## 2024-11-29 MED ORDER — STERILE WATER FOR IRRIGATION IR SOLN
Status: DC | PRN
  Administered 2024-11-29: 1000 mL

## 2024-11-29 MED ORDER — CHLORHEXIDINE GLUCONATE 4 % EX SOLN: 60.0000 mL | Freq: Once | CUTANEOUS | Status: DC

## 2024-11-29 MED ORDER — CITALOPRAM HYDROBROMIDE 20 MG PO TABS
40.0000 mg | ORAL_TABLET | Freq: Every evening | ORAL | Status: DC
  Administered 2024-11-29 – 2024-12-02 (×4): 40 mg via ORAL
  Filled 2024-11-29 (×4): qty 2

## 2024-11-29 MED ORDER — LIDOCAINE 2% (20 MG/ML) 5 ML SYRINGE
INTRAMUSCULAR | Status: DC | PRN
  Administered 2024-11-29: 60 mg via INTRAVENOUS

## 2024-11-29 MED ORDER — PROPOFOL 10 MG/ML IV BOLUS
INTRAVENOUS | Status: AC
  Filled 2024-11-29: qty 20

## 2024-11-29 MED ORDER — BUSPIRONE HCL 5 MG PO TABS
5.0000 mg | ORAL_TABLET | Freq: Two times a day (BID) | ORAL | Status: DC
  Administered 2024-11-29 – 2024-12-03 (×8): 5 mg via ORAL
  Filled 2024-11-29 (×8): qty 1

## 2024-11-29 MED ORDER — DEXAMETHASONE SOD PHOSPHATE PF 10 MG/ML IJ SOLN
INTRAMUSCULAR | Status: AC
  Filled 2024-11-29: qty 1

## 2024-11-29 MED ORDER — ORAL CARE MOUTH RINSE: 15.0000 mL | Freq: Once | OROMUCOSAL | Status: AC

## 2024-11-29 MED ORDER — DOCUSATE SODIUM 100 MG PO CAPS
100.0000 mg | ORAL_CAPSULE | Freq: Two times a day (BID) | ORAL | Status: DC
  Administered 2024-11-29 – 2024-12-02 (×5): 100 mg via ORAL
  Filled 2024-11-29 (×8): qty 1

## 2024-11-29 MED ORDER — ROCURONIUM BROMIDE 10 MG/ML (PF) SYRINGE
PREFILLED_SYRINGE | INTRAVENOUS | Status: AC
  Filled 2024-11-29: qty 10

## 2024-11-29 MED ORDER — CHLORHEXIDINE GLUCONATE 0.12 % MT SOLN
15.0000 mL | Freq: Once | OROMUCOSAL | Status: AC
  Administered 2024-11-29: 15 mL via OROMUCOSAL

## 2024-11-29 MED ORDER — KETOROLAC TROMETHAMINE 15 MG/ML IJ SOLN
INTRAMUSCULAR | Status: DC | PRN
  Administered 2024-11-29: 15 mg via INTRAVENOUS

## 2024-11-29 MED ORDER — ONDANSETRON HCL 4 MG/2ML IJ SOLN
INTRAMUSCULAR | Status: AC
  Filled 2024-11-29: qty 2

## 2024-11-29 MED ORDER — ONDANSETRON HCL 4 MG/2ML IJ SOLN: 4.0000 mg | Freq: Once | INTRAMUSCULAR | Status: DC | PRN

## 2024-11-29 MED ORDER — CEFAZOLIN SODIUM-DEXTROSE 2-4 GM/100ML-% IV SOLN
INTRAVENOUS | Status: AC
  Filled 2024-11-29: qty 100

## 2024-11-29 MED ORDER — FENTANYL CITRATE (PF) 100 MCG/2ML IJ SOLN
INTRAMUSCULAR | Status: AC
  Filled 2024-11-29: qty 2

## 2024-11-29 MED ORDER — ROCURONIUM BROMIDE 10 MG/ML (PF) SYRINGE
PREFILLED_SYRINGE | INTRAVENOUS | Status: DC | PRN
  Administered 2024-11-29: 50 mg via INTRAVENOUS

## 2024-11-29 MED ORDER — 0.9 % SODIUM CHLORIDE (POUR BTL) OPTIME
TOPICAL | Status: DC | PRN
  Administered 2024-11-29: 1000 mL

## 2024-11-29 MED ORDER — ACETAMINOPHEN 10 MG/ML IV SOLN
INTRAVENOUS | Status: AC
  Filled 2024-11-29: qty 100

## 2024-11-29 MED ORDER — PHENOL 1.4 % MT LIQD: 1.0000 | OROMUCOSAL | Status: DC | PRN

## 2024-11-29 MED ORDER — CHLORHEXIDINE GLUCONATE 0.12 % MT SOLN
OROMUCOSAL | Status: AC
  Filled 2024-11-29: qty 15

## 2024-11-29 MED ORDER — PHENYLEPHRINE 80 MCG/ML (10ML) SYRINGE FOR IV PUSH (FOR BLOOD PRESSURE SUPPORT)
PREFILLED_SYRINGE | INTRAVENOUS | Status: DC | PRN
  Administered 2024-11-29: 160 ug via INTRAVENOUS
  Administered 2024-11-29: 80 ug via INTRAVENOUS

## 2024-11-29 MED ORDER — LACTATED RINGERS IV SOLN: INTRAVENOUS | Status: DC

## 2024-11-29 MED ORDER — CEFAZOLIN SODIUM-DEXTROSE 2-4 GM/100ML-% IV SOLN
2.0000 g | INTRAVENOUS | Status: AC
  Administered 2024-11-29: 1 g via INTRAVENOUS

## 2024-11-29 MED ORDER — TRANEXAMIC ACID-NACL 1000-0.7 MG/100ML-% IV SOLN
INTRAVENOUS | Status: AC
  Filled 2024-11-29: qty 100

## 2024-11-29 MED ORDER — FENTANYL CITRATE (PF) 250 MCG/5ML IJ SOLN
INTRAMUSCULAR | Status: DC | PRN
  Administered 2024-11-29 (×2): 50 ug via INTRAVENOUS

## 2024-11-29 MED ORDER — INSULIN ASPART 100 UNIT/ML IJ SOLN: 0.0000 [IU] | INTRAMUSCULAR | Status: DC | PRN

## 2024-11-29 MED ORDER — FENTANYL CITRATE (PF) 100 MCG/2ML IJ SOLN: 25.0000 ug | INTRAMUSCULAR | Status: DC | PRN

## 2024-11-29 MED ORDER — POVIDONE-IODINE 10 % EX SWAB: 2.0000 | Freq: Once | CUTANEOUS | Status: DC

## 2024-11-29 MED ORDER — PROPOFOL 10 MG/ML IV BOLUS
INTRAVENOUS | Status: DC | PRN
  Administered 2024-11-29: 80 mg via INTRAVENOUS

## 2024-11-29 MED ORDER — SUGAMMADEX SODIUM 200 MG/2ML IV SOLN
INTRAVENOUS | Status: DC | PRN
  Administered 2024-11-29: 100 mg via INTRAVENOUS

## 2024-11-29 MED ORDER — PANCRELIPASE (LIP-PROT-AMYL) 36000-114000 UNITS PO CPEP
36000.0000 [IU] | ORAL_CAPSULE | Freq: Three times a day (TID) | ORAL | Status: DC
  Administered 2024-12-01 – 2024-12-02 (×5): 36000 [IU] via ORAL
  Filled 2024-11-29 (×12): qty 1

## 2024-11-29 MED ORDER — APIXABAN 2.5 MG PO TABS
2.5000 mg | ORAL_TABLET | Freq: Two times a day (BID) | ORAL | Status: DC
  Administered 2024-11-30 – 2024-12-03 (×6): 2.5 mg via ORAL
  Filled 2024-11-29 (×6): qty 1

## 2024-11-29 MED ORDER — ACETAMINOPHEN 10 MG/ML IV SOLN
INTRAVENOUS | Status: DC | PRN
  Administered 2024-11-29: 1000 mg via INTRAVENOUS

## 2024-11-29 MED ORDER — LIDOCAINE 2% (20 MG/ML) 5 ML SYRINGE
INTRAMUSCULAR | Status: AC
  Filled 2024-11-29: qty 5

## 2024-11-29 MED ORDER — SUCCINYLCHOLINE CHLORIDE 200 MG/10ML IV SOSY
PREFILLED_SYRINGE | INTRAVENOUS | Status: AC
  Filled 2024-11-29: qty 10

## 2024-11-29 MED ORDER — DEXTROSE-SODIUM CHLORIDE 5-0.45 % IV SOLN: INTRAVENOUS | Status: AC

## 2024-11-29 MED ORDER — MENTHOL 3 MG MT LOZG: 1.0000 | LOZENGE | OROMUCOSAL | Status: DC | PRN

## 2024-11-29 MED ORDER — TRANEXAMIC ACID-NACL 1000-0.7 MG/100ML-% IV SOLN
1000.0000 mg | INTRAVENOUS | Status: AC
  Administered 2024-11-29: 1000 mg via INTRAVENOUS

## 2024-11-29 MED ORDER — MIDAZOLAM HCL 2 MG/2ML IJ SOLN
INTRAMUSCULAR | Status: AC
  Filled 2024-11-29: qty 2

## 2024-11-29 NOTE — Progress Notes (Signed)
 PT Cancellation Note  Patient Details Name: NIASHA DEVINS MRN: [id number] DOB: 02-10-1961   Cancelled Treatment:    Reason Eval/Treat Not Completed: Other (comment)  Currently in surgery;   Will follow up later today as time allows;  Otherwise, will follow up for PT tomorrow;   Thank you,  Silvano Currier, PT  Acute Rehabilitation Services Office [phone]    Silvano VEAR Currier 11/29/2024, 8:14 AM

## 2024-11-29 NOTE — Plan of Care (Signed)
  Problem: Education: Goal: Ability to describe self-care measures that may prevent or decrease complications (Diabetes Survival Skills Education) will improve Outcome: Progressing   Problem: Coping: Goal: Ability to adjust to condition or change in health will improve Outcome: Progressing   Problem: Nutritional: Goal: Maintenance of adequate nutrition will improve Outcome: Progressing

## 2024-11-29 NOTE — Interval H&P Note (Signed)
 History and Physical Interval Note:  11/29/2024 7:18 AM  Jacqueline Tyler  has presented today for surgery, with the diagnosis of Right Intertrochanteric Hip fracture.  The various methods of treatment have been discussed with the patient and family. After consideration of risks, benefits and other options for treatment, the patient has consented to  Procedures: FIXATION, FRACTURE, INTERTROCHANTERIC, WITH INTRAMEDULLARY ROD (Right) as a surgical intervention.  The patient's history has been reviewed, patient examined, no change in status, stable for surgery.  I have reviewed the patient's chart and labs.  Questions were answered to the patient's satisfaction.     Josefa LELON Herring

## 2024-11-29 NOTE — Anesthesia Preprocedure Evaluation (Signed)
"                                    Anesthesia Evaluation  Patient identified by MRN, date of birth, ID band Patient awake    Reviewed: Allergy & Precautions, NPO status , Patient's Chart, lab work & pertinent test results  Airway Mallampati: II  TM Distance: >3 FB Neck ROM: Full    Dental  (+) Teeth Intact, Dental Advisory Given   Pulmonary neg pulmonary ROS   Pulmonary exam normal breath sounds clear to auscultation       Cardiovascular hypertension, Normal cardiovascular exam Rhythm:Regular Rate:Normal     Neuro/Psych  PSYCHIATRIC DISORDERS Anxiety Depression    CVA    GI/Hepatic Neg liver ROS, hiatal hernia, PUD,GERD  Medicated,,H/o pancreatic cancer    Endo/Other  diabetes, Type 2    Renal/GU negative Renal ROS     Musculoskeletal Right Intertrochanteric Hip fracture   Abdominal   Peds  Hematology  (+) Blood dyscrasia, anemia Plt 284k   Anesthesia Other Findings Day of surgery medications reviewed with the patient.  Reproductive/Obstetrics                              Anesthesia Physical Anesthesia Plan  ASA: 3  Anesthesia Plan: General   Post-op Pain Management: Ofirmev  IV (intra-op)* and Toradol  IV (intra-op)*   Induction: Intravenous  PONV Risk Score and Plan: 3 and Midazolam , Dexamethasone  and Ondansetron   Airway Management Planned: Oral ETT  Additional Equipment:   Intra-op Plan:   Post-operative Plan: Extubation in OR  Informed Consent: I have reviewed the patients History and Physical, chart, labs and discussed the procedure including the risks, benefits and alternatives for the proposed anesthesia with the patient or authorized representative who has indicated his/her understanding and acceptance.     Dental advisory given  Plan Discussed with: CRNA  Anesthesia Plan Comments:         Anesthesia Quick Evaluation  "

## 2024-11-29 NOTE — Plan of Care (Signed)
" °  Problem: Skin Integrity: Goal: Risk for impaired skin integrity will decrease Outcome: Progressing   Problem: Education: Goal: Knowledge of General Education information will improve Description: Including pain rating scale, medication(s)/side effects and non-pharmacologic comfort measures Outcome: Progressing   Problem: Coping: Goal: Level of anxiety will decrease Outcome: Progressing   Problem: Elimination: Goal: Will not experience complications related to bowel motility Outcome: Progressing Goal: Will not experience complications related to urinary retention Outcome: Progressing   Problem: Pain Managment: Goal: General experience of comfort will improve and/or be controlled Outcome: Progressing   Problem: Safety: Goal: Ability to remain free from injury will improve Outcome: Progressing   Problem: Skin Integrity: Goal: Risk for impaired skin integrity will decrease Outcome: Progressing   "

## 2024-11-29 NOTE — Op Note (Signed)
 Procedures: FIXATION, FRACTURE, INTERTROCHANTERIC, WITH INTRAMEDULLARY ROD Procedure Note  DEEM MARMOL female 64 y.o. 11/29/2024  Preoperative diagnosis: Right proximal femur intertrochanteric fracture  Postoperative diagnosis: Same  Procedure(s) and Anesthesia Type:    * FIXATION, FRACTURE, INTERTROCHANTERIC, WITH INTRAMEDULLARY ROD - General  Surgeons and Role:    DEWAINE Dozier Soulier, MD - Primary   Indications:  64 y.o. female s/p fall with right hip fracture. Indicated for surgery to promote early ambulation, pain control and prevent complications of bed rest.     Surgeon: Josefa LELON Dozier   Assistants: Jeoffrey Northern PA-C Amber was present and scrubbed throughout the procedure and was essential in positioning, retraction, exposure, and closure)  Anesthesia: General endotracheal anesthesia   Procedure Detail  FIXATION, FRACTURE, INTERTROCHANTERIC, WITH INTRAMEDULLARY ROD  Findings: Near-anatomic alignment with Biomet affixes 9 mm short nail  Estimated Blood Loss:  200 mL         Drains: none  Blood Given: none          Specimens: none        Complications:  * No complications entered in OR log *         Disposition: PACU - hemodynamically stable.         Condition: stable    Procedure:  The patient was identified in the preoperative holding area  where I personally marked the operative site after verifying site, side,  and procedure with the patient. She was taken back to the operating  room where general anesthesia was induced without complication. She was  placed on the hana table with the right lower extremity in traction,  and opposite lower extremity in an extended position. The arms were well  padded. Fluoroscopic imaging was used to verify reduction with gentle traction  and internal rotation. The right hip was then prepped and draped in the standard sterile fashion. An approximately 3 cm incision was made proximal  to the palpable greater  trochanter tip. Dissection was carried down to  the tip and the short guidewire was placed under fluoroscopic imaging.  The proximal entry reamer was used to open the canal and the ball-tipped  guidewire was then placed and advanced down the femoral canal.  The 9 mm diameter nail was felt to be appropriate.  The 9 mm short nail was then  advanced over the guidewire without difficulty and the proximal jig was  then placed and a small 1.5 cm incision was made on the lateral thigh to  advance the lag screw guide against the lateral aspect of the femur.  The guide pin was advanced and its position was verified in AP and  lateral planes to be centered in the head.  The guidewire was over  reamed and the appropriate size lag screw was advanced. The  proximal set screw was then advanced, backed off a quarter turn to allow  sliding. AP and lateral imaging demonstrated appropriate position of  the screw and reduction of the fracture. The proximal jig was then  Used to place 1 distal static interlocking screw . final fluoroscopic imaging in AP and lateral planes at the hip and the  knee demonstrated near anatomic reduction with appropriate length and  position of the hardware. All wounds were then copiously irrigated with  normal saline and subsequently closed in layers with #1 Vicryl in a deep  fascia layer, 2-0 Vicryl in a deep dermal layer, and staples for skin  closure. Sterile dressings were then applied including 4x4s and Mepilex  dressings. The patient was then taken off the fracture table,  transferred to the stretcher, and taken to the recovery room in stable  condition after she was extubated.   POSTOPERATIVE PLAN: She will be weightbearing as tolerated on the operative extremity.

## 2024-11-29 NOTE — Progress Notes (Signed)
 Pt teeth brushed as well as antiseptic mouthwash. CHG bath x2. Pt unable to void. Pt states she normally has difficulty in am. IVF stopped, saline lock. Pt left floor at 0645. Pt on 2LNC for desaturation d/t pain medications. Pt stated her husband gave consent in ED @ [facility] for surgery. No consent in chart. Pt has questions for MD. Pt Aox4, encouraged to direct questions to MD regarding surgery.

## 2024-11-29 NOTE — Transfer of Care (Signed)
 Immediate Anesthesia Transfer of Care Note  Patient: Jacqueline Tyler  Procedure(s) Performed: FIXATION, FRACTURE, INTERTROCHANTERIC, WITH INTRAMEDULLARY ROD (Right: Leg Upper)  Patient Location: PACU  Anesthesia Type:General  Level of Consciousness: awake, alert , and oriented  Airway & Oxygen Therapy: Patient Spontanous Breathing and Patient connected to nasal cannula oxygen  Post-op Assessment: Report given to RN and Post -op Vital signs reviewed and stable  Post vital signs: Reviewed and stable  Last Vitals:  Vitals Value Taken Time  BP 102/83 11/29/24 09:00  Temp    Pulse 89 11/29/24 09:04  Resp 10 11/29/24 09:04  SpO2 91 % 11/29/24 09:04  Vitals shown include unfiled device data.  Last Pain:  Vitals:   11/29/24 0717  TempSrc: Oral  PainSc: 0-No pain      Patients Stated Pain Goal: 4 (11/29/24 9364)  Complications: No notable events documented.

## 2024-11-29 NOTE — Anesthesia Procedure Notes (Signed)
 Procedure Name: Intubation Date/Time: 11/29/2024 7:52 AM  Performed by: Bonny Avelina Caldron, CRNAPre-anesthesia Checklist: Patient identified, Emergency Drugs available, Suction available and Patient being monitored Patient Re-evaluated:Patient Re-evaluated prior to induction Oxygen Delivery Method: Circle system utilized Preoxygenation: Pre-oxygenation with 100% oxygen Induction Type: IV induction Ventilation: Mask ventilation without difficulty Laryngoscope Size: Glidescope and 3 Grade View: Grade I Tube type: Oral Tube size: 7.0 mm Number of attempts: 1 Airway Equipment and Method: Stylet and Oral airway Placement Confirmation: ETT inserted through vocal cords under direct vision, positive ETCO2 and breath sounds checked- equal and bilateral Secured at: 21 cm Tube secured with: Tape Dental Injury: Teeth and Oropharynx as per pre-operative assessment

## 2024-11-29 NOTE — Anesthesia Postprocedure Evaluation (Signed)
"   Anesthesia Post Note  Patient: Jacqueline Tyler  Procedure(s) Performed: FIXATION, FRACTURE, INTERTROCHANTERIC, WITH INTRAMEDULLARY ROD (Right: Leg Upper)     Patient location during evaluation: PACU Anesthesia Type: General Level of consciousness: awake and alert Pain management: pain level controlled Vital Signs Assessment: post-procedure vital signs reviewed and stable Respiratory status: spontaneous breathing, nonlabored ventilation, respiratory function stable and patient connected to nasal cannula oxygen Cardiovascular status: blood pressure returned to baseline and stable Postop Assessment: no apparent nausea or vomiting Anesthetic complications: no   No notable events documented.  Last Vitals:  Vitals:   11/29/24 1025 11/29/24 1435  BP: 128/64 (!) 130/59  Pulse: 76 79  Resp: 15 16  Temp: (!) 36.3 C   SpO2: 100% 98%    Last Pain:  Vitals:   11/29/24 0930  TempSrc:   PainSc: 0-No pain                 Garnette [name]      "

## 2024-11-29 NOTE — Progress Notes (Signed)
 OT Cancellation Note  Patient Details Name: Jacqueline Tyler MRN: [id number] DOB: 1961-03-06   Cancelled Treatment:    Reason Eval/Treat Not Completed: Patient at procedure or test/ unavailable (Patient in OR this morning   OT will attempt when patient is available and medically stable)  [name] 11/29/2024, 2:36 PM

## 2024-11-29 NOTE — Discharge Instructions (Addendum)
 Discharge Instructions after Knee Arthroscopy   You will have a light dressing on your hip Leave the dressing in place until the third day after your surgery and then remove it and place a band-aid over the stitches.  After the bandage has been removed you may shower, but do not soak the incision. You may begin gentle motion of your leg immediately after surgery. You may put weight on the right leg after surgery Apply ice 3 times per day for 30 minutes for the first 1 week  Pain medicine has been prescribed for you.  Use your medicine as needed over the first 48 hours, and then you can begin to taper your use. You may take Extra Strength Tylenol  or Tylenol  only in place of the pain pills.  Take one 81mg  aspirin daily for 3 weeks post-operatively unless it upsets your stomach.   Please call [phone] during normal business hours or [phone] after hours for any problems. Including the following:  - excessive redness of the incisions - drainage for more than 4 days - fever of more than 101.5 F  *Please note that pain medications will not be refilled after hours or on weekends.    Information on my medicine - ELIQUIS  (apixaban )  This medication education was reviewed with me or my healthcare representative as part of my discharge preparation.   Why was Eliquis  prescribed for you? Eliquis  was prescribed for you to reduce the risk of blood clots forming after orthopedic surgery.    What do You need to know about Eliquis ? Take your Eliquis  TWICE DAILY - one tablet in the morning and one tablet in the evening with or without food.  It would be best to take the dose about the same time each day.  If you have difficulty swallowing the tablet whole please discuss with your pharmacist how to take the medication safely.  Take Eliquis  exactly as prescribed by your doctor and DO NOT stop taking Eliquis  without talking to the doctor who prescribed the medication.  Stopping without  other medication to take the place of Eliquis  may increase your risk of developing a clot.  After discharge, you should have regular check-up appointments with your healthcare provider that is prescribing your Eliquis .  What do you do if you miss a dose? If a dose of ELIQUIS  is not taken at the scheduled time, take it as soon as possible on the same day and twice-daily administration should be resumed.  The dose should not be doubled to make up for a missed dose.  Do not take more than one tablet of ELIQUIS  at the same time.  Important Safety Information A possible side effect of Eliquis  is bleeding. You should call your healthcare provider right away if you experience any of the following: Bleeding from an injury or your nose that does not stop. Unusual colored urine (red or dark brown) or unusual colored stools (red or black). Unusual bruising for unknown reasons. A serious fall or if you hit your head (even if there is no bleeding).  Some medicines may interact with Eliquis  and might increase your risk of bleeding or clotting while on Eliquis . To help avoid this, consult your healthcare provider or pharmacist prior to using any new prescription or non-prescription medications, including herbals, vitamins, non-steroidal anti-inflammatory drugs (NSAIDs) and supplements.  This website has more information on Eliquis  (apixaban ): http://[email or web address] .com/eliquis dena

## 2024-11-29 NOTE — Progress Notes (Signed)
 " PROGRESS NOTE    Jacqueline Tyler  FMW:[id number] DOB: 1961/01/02 DOA: 11/28/2024 PCP: [name], DO   Brief Narrative:  Jacqueline Tyler is a 64 y.o. female who presents to our facility after mechanical fall -patient tripped over her dog.  Unable to ambulate thereafter due to severe pain and presented to our facility with notable fracture of her right femur, orthopedics called in consult, hospitalist called for admission.  Of note patient has comorbid conditions including advanced metastatic pancreatic cancer currently on chemotherapy as well as diabetes  Assessment & Plan:   Principal Problem:   Hip fracture (HCC) Active Problems:   Type 2 diabetes mellitus without complications (HCC)   Pancreatic cancer (HCC)   Acute mechanical fall with subsequent right femur fracture, intertrochanteric with varus angulation  - Status post fixation with intramedullary rod on 11/29/2024  - Resume diet, pain currently well-controlled -PT OT to follow, tentative disposition SNF given age and comorbidities  Right wrist fracture - Mildly impacted fracture of the distal right radial metaphysis - Nonoperative treatment -continue brace per orthopedics  Metastatic pancreatic cancer Patient sees Dr. Lanny.  Actively receiving treatment, last chemotherapy 11/26/2024.  Port in place. Patient expresses desire to be full code.   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  -restart Creon  and advance diet postoperatively Diabetes mellitus secondary to pancreatic cancer -continue sliding scale insulin , hypoglycemic protocol  DVT prophylaxis: Sequential compression device to OR Start: 11/29/24 0617 Place TED hose Start: 11/28/24 2053 Code Status:   Code Status: Full Code Family Communication: Husband at bedside  Status is:  Inpatient  Dispo: The patient is from: Home              Anticipated d/c is to: To be determined, being evaluated for SNF              Anticipated d/c date is: 48 to 72 hours              Patient currently not medically stable for discharge given recent surgery need for PT evaluation and pain management  Consultants:  Orthopedic surgery  Procedures:  Right femur intramedullary rod placement 11/29/2024  Antimicrobials:  Perioperatively  Subjective: No acute issues or events overnight denies nausea vomiting diarrhea constipation high fevers chills or chest pain.  Left hip pain markedly improved postoperatively denies any distal paresthesias or numbness  Objective: Vitals:   11/28/24 2127 11/28/24 2152 11/29/24 0449 11/29/24 0717  BP: 133/72 128/88 (!) 113/55 117/67  Pulse: 87 88 73 66  Resp: 20 20 13 16   Temp: 98.5 F (36.9 C) 98.1 F (36.7 C) 98.1 F (36.7 C) 97.9 F (36.6 C)  TempSrc: Oral Oral Oral Oral  SpO2: 95% 99% 98% 100%  Weight:      Height:        Intake/Output Summary (Last 24 hours) at 11/29/2024 0831 Last data filed at 11/29/2024 0428 Gross per 24 hour  Intake 172.23 ml  Output --  Net 172.23 ml   Filed Weights   11/28/24 1934  Weight: 50.7 kg    Examination:  General:  Pleasantly resting in bed, No acute distress. HEENT:  Normocephalic atraumatic.  Sclerae nonicteric, noninjected.  Extraocular movements intact bilaterally. Neck:  Without mass or deformity.  Trachea is midline. Lungs:  Clear to auscultate bilaterally without rhonchi, wheeze, or rales. Heart:  Regular rate and rhythm.  Without murmurs, rubs,  or gallops. Abdomen:  Soft, nontender, nondistended.  Without guarding or rebound. Extremities: Without cyanosis, clubbing, edema, or obvious deformity. Skin:  Warm and dry, no erythema.  Data Reviewed: I have personally reviewed following labs and imaging studies  CBC: Recent Labs  Lab 11/26/24 1030 11/28/24 1828  WBC 6.3 13.8*   NEUTROABS 4.5 13.5*  HGB 9.4* 9.0*  HCT 29.0* 27.4*  MCV 99.0 100.7*  PLT 273 284   Basic Metabolic Panel: Recent Labs  Lab 11/26/24 1030 11/28/24 1828  NA 137 134*  K 4.4 3.7  CL 104 99  CO2 26 25  GLUCOSE 111* 122*  BUN 7* 14  CREATININE 0.84 0.84  CALCIUM  8.3* 8.5*   GFR: Estimated Creatinine Clearance: 51.1 mL/min (by C-G formula based on SCr of 0.84 mg/dL). Liver Function Tests: Recent Labs  Lab 11/26/24 1030 11/28/24 1828  AST 39 54*  ALT 24 35  ALKPHOS 140* 218*  BILITOT 0.6 1.2  PROT 6.3* 6.3*  ALBUMIN 3.3* 3.1*   No results for input(s): LIPASE, AMYLASE in the last 168 hours. No results for input(s): AMMONIA in the last 168 hours. Coagulation Profile: No results for input(s): INR, PROTIME in the last 168 hours. Cardiac Enzymes: No results for input(s): CKTOTAL, CKMB, CKMBINDEX, TROPONINI in the last 168 hours. BNP (last 3 results) No results for input(s): PROBNP in the last 8760 hours. HbA1C: No results for input(s): HGBA1C in the last 72 hours. CBG: Recent Labs  Lab 11/28/24 2232 11/29/24 0418 11/29/24 0716  GLUCAP 120* 123* 112*   Lipid Profile: No results for input(s): CHOL, HDL, LDLCALC, TRIG, CHOLHDL, LDLDIRECT in the last 72 hours. Thyroid Function Tests: No results for input(s): TSH, T4TOTAL, FREET4, T3FREE, THYROIDAB in the last 72 hours. Anemia Panel: No results for input(s): VITAMINB12, FOLATE, FERRITIN, TIBC, IRON, RETICCTPCT in the last 72 hours. Sepsis Labs: No results for input(s): PROCALCITON, LATICACIDVEN in the last 168 hours.  Recent Results (from the past 240 hours)  Surgical PCR screen     Status: None   Collection Time: 11/28/24 10:22 PM   Specimen: Nasal Mucosa; Nasal Swab  Result Value Ref Range Status   MRSA, PCR NEGATIVE NEGATIVE Final   Staphylococcus aureus NEGATIVE NEGATIVE Final    Comment: (NOTE) The Xpert SA Assay (FDA approved for NASAL specimens  in patients 91 years of age and older), is one component of a comprehensive surveillance program. It is not intended to diagnose infection nor to guide or monitor treatment. Performed at [facility], [address] [place], [place] [address]          Radiology Studies: DG Wrist Complete Right Result Date: 11/28/2024 EXAM: 3 OR MORE VIEW(S) XRAY OF THE WRIST 11/28/2024 05:19:16 PM COMPARISON: None available. CLINICAL HISTORY: fall FINDINGS: BONES AND JOINTS: Mildly impacted fracture of distal radial metaphysis. Neutral tilt of the distal radial articular surface. No dislocation. No malalignment. Mild osteopenia. SOFT TISSUES: Mild soft tissue swelling. IMPRESSION: 1. Mildly impacted fracture of the distal radial metaphysis with neutral tilt of the distal radial articular surface. No dislocation. 2. Mild soft tissue swelling. Electronically signed by: Dorethia Molt MD MD 11/28/2024 05:38 PM EST RP Workstation: [id number]   DG Hip Unilat  With Pelvis 2-3 Views Right Result Date: 11/28/2024 EXAM: 2 OR MORE VIEW(S) XRAY OF THE RIGHT HIP 11/28/2024 05:19:16 PM COMPARISON: None available. CLINICAL HISTORY: severe pain per pt, fall FINDINGS: BONES AND JOINTS: Intertrochanteric fracture of the right hip with varus angulation. SOFT TISSUES: Large rectal stool  ball. IMPRESSION: 1. Intertrochanteric fracture of the right hip with varus angulation. 2. Large rectal stool ball. Electronically signed by: Dorethia Molt MD MD 11/28/2024 05:37 PM EST RP Workstation: [id number]        Scheduled Meds:  chlorhexidine   60 mL Topical Once   [MAR Hold] Chlorhexidine  Gluconate Cloth  6 each Topical Daily   [MAR Hold] insulin  aspart  0-6 Units Subcutaneous Q4H   povidone-iodine   2 Application Topical Once   Continuous Infusions:  dextrose  5 % and 0.45 % NaCl Stopped (11/29/24 0634)   lactated ringers  10 mL/hr at 11/29/24 0744     LOS: 1 day    Time spent:    Elsie JAYSON Montclair,  DO [facility]  If 7PM-7AM, please contact night-coverage [email or web address]  11/29/2024, 8:31 AM      "

## 2024-11-30 ENCOUNTER — Encounter (HOSPITAL_COMMUNITY): Payer: Self-pay | Admitting: Orthopedic Surgery

## 2024-11-30 DIAGNOSIS — C250 Malignant neoplasm of head of pancreas: Secondary | ICD-10-CM

## 2024-11-30 DIAGNOSIS — E119 Type 2 diabetes mellitus without complications: Secondary | ICD-10-CM

## 2024-11-30 DIAGNOSIS — S72001A Fracture of unspecified part of neck of right femur, initial encounter for closed fracture: Secondary | ICD-10-CM

## 2024-11-30 LAB — CBC
HCT: 21.1 % — ABNORMAL LOW (ref 36.0–46.0)
Hemoglobin: 6.7 g/dL — CL (ref 12.0–15.0)
MCH: 32.4 pg (ref 26.0–34.0)
MCHC: 31.8 g/dL (ref 30.0–36.0)
MCV: 101.9 fL — ABNORMAL HIGH (ref 80.0–100.0)
Platelets: 263 K/uL (ref 150–400)
RBC: 2.07 MIL/uL — ABNORMAL LOW (ref 3.87–5.11)
RDW: 14.4 % (ref 11.5–15.5)
WBC: 6.9 K/uL (ref 4.0–10.5)
nRBC: 0 % (ref 0.0–0.2)

## 2024-11-30 LAB — COMPREHENSIVE METABOLIC PANEL WITH GFR
ALT: 30 U/L (ref 0–44)
AST: 39 U/L (ref 15–41)
Albumin: 2.7 g/dL — ABNORMAL LOW (ref 3.5–5.0)
Alkaline Phosphatase: 142 U/L — ABNORMAL HIGH (ref 38–126)
Anion gap: 7 (ref 5–15)
BUN: 15 mg/dL (ref 8–23)
CO2: 28 mmol/L (ref 22–32)
Calcium: 8.2 mg/dL — ABNORMAL LOW (ref 8.9–10.3)
Chloride: 98 mmol/L (ref 98–111)
Creatinine, Ser: 0.87 mg/dL (ref 0.44–1.00)
GFR, Estimated: >60 mL/min
Glucose, Bld: 168 mg/dL — ABNORMAL HIGH (ref 70–99)
Potassium: 4.4 mmol/L (ref 3.5–5.1)
Sodium: 133 mmol/L — ABNORMAL LOW (ref 135–145)
Total Bilirubin: 0.4 mg/dL (ref 0.0–1.2)
Total Protein: 5.4 g/dL — ABNORMAL LOW (ref 6.5–8.1)

## 2024-11-30 LAB — GLUCOSE, CAPILLARY
Glucose-Capillary: 166 mg/dL — ABNORMAL HIGH (ref 70–99)
Glucose-Capillary: 115 mg/dL — ABNORMAL HIGH (ref 70–99)
Glucose-Capillary: 115 mg/dL — ABNORMAL HIGH (ref 70–99)
Glucose-Capillary: 111 mg/dL — ABNORMAL HIGH (ref 70–99)
Glucose-Capillary: 115 mg/dL — ABNORMAL HIGH (ref 70–99)
Glucose-Capillary: 101 mg/dL — ABNORMAL HIGH (ref 70–99)

## 2024-11-30 LAB — PROTIME-INR
INR: 1 (ref 0.8–1.2)
Prothrombin Time: 13.6 s (ref 11.4–15.2)

## 2024-11-30 LAB — PREPARE RBC (CROSSMATCH)

## 2024-11-30 LAB — HEMOGLOBIN AND HEMATOCRIT, BLOOD
HCT: 21.6 % — ABNORMAL LOW (ref 36.0–46.0)
HCT: 25.8 % — ABNORMAL LOW (ref 36.0–46.0)
Hemoglobin: 7 g/dL — ABNORMAL LOW (ref 12.0–15.0)
Hemoglobin: 8.3 g/dL — ABNORMAL LOW (ref 12.0–15.0)

## 2024-11-30 MED ORDER — SODIUM CHLORIDE 0.9% IV SOLUTION: Freq: Once | INTRAVENOUS | Status: DC

## 2024-11-30 NOTE — TOC CAGE-AID Note (Signed)
 Transition of Care [facility]) - CAGE-AID Screening   Patient Details  Name: Jacqueline Tyler MRN: [id number] Date of Birth: 12-07-1960  Transition of [facility]) CM/SW Contact:    Vaishali Baise E Ornella Coderre, LCSW Phone Number: 11/30/2024, 10:04 AM   Clinical Narrative: Disoriented x 2. No SA noted.   CAGE-AID Screening: Substance Abuse Screening unable to be completed due to: : Patient unable to participate

## 2024-11-30 NOTE — Progress Notes (Signed)
 PT Cancellation Note  Patient Details Name: Jacqueline Tyler MRN: [id number] DOB: January 11, 1961   Cancelled Treatment:    Reason Eval/Treat Not Completed: Medical issues which prohibited therapy (PT consult appreciated and chart reviewed. Pt still pending 1 unit PRBCs. Per RN, she is about to hang the transfusion. Will follow-up for PT evaluation as schedule permits.)  Randall SAUNDERS, PT, DPT Acute Rehabilitation Services Office: [phone] Secure Chat Preferred  Delon CHRISTELLA Callander 11/30/2024, 1:42 PM

## 2024-11-30 NOTE — Evaluation (Signed)
 Physical Therapy Evaluation Patient Details Name: Jacqueline Tyler MRN: [id number] DOB: [birth year] Today's Date: 11/30/2024  History of Present Illness  64 yo F adm 11/28/24 s/p fall with R femur fx and R wrist fx. Pt s/p 11/29/24 IM rod R LE. PMHx: metastic pancreatic CA, HTN, and DM2.  Clinical Impression  Pt admitted with above diagnosis. PTA, pt was independent with functional mobility, ADLs/IADLs, and working. She lives with her husband in a one story house with 2+2 STE. He reports he can provide 24/7 light assistance with transfers and basic ADLs and their son could provided increased physical assistance prn if needed after work at 6pm. Pt currently with functional limitations due to the deficits listed below (see PT Problem List). She required minA for bed mobility, modA for sit<>stand, and minA for bed>chair stand pivot transfer. Pt is currently limited by pain, weight bearing status (RLE WBAT; RUE NWB to hand/wrist, WB thru elbow only), impaired balance, and decreased activity tolerance. She was unable to ambulate at this time; therefore, will benefit from a w/c which she can be bumped up the stairs in to get insider her home with assist. Pt will benefit from acute skilled PT to increase her independence and safety with mobility to allow discharge. Recommend HHPT to increase ROM/strength, improve balance, decrease fall risk, and optimize safety within the home environment.    If plan is discharge home, recommend the following: A lot of help with walking and/or transfers;A lot of help with bathing/dressing/bathroom;Assistance with cooking/housework;Assist for transportation;Help with stairs or ramp for entrance   Can travel by private vehicle        Equipment Recommendations Wheelchair (measurements PT);Wheelchair cushion (measurements PT);BSC/3in1;Rolling walker (2 wheels);Other (comment) (RUE platform attachment)  Recommendations for Other Services       Functional Status Assessment Patient  has had a recent decline in their functional status and demonstrates the ability to make significant improvements in function in a reasonable and predictable amount of time.     Precautions / Restrictions Precautions Precautions: Fall Recall of Precautions/Restrictions: Intact Required Braces or Orthoses: Splint/Cast Splint/Cast: R wrist cock up Restrictions Weight Bearing Restrictions Per Provider Order: Yes RUE Weight Bearing Per Provider Order: Weight bear through elbow only RLE Weight Bearing Per Provider Order: Weight bearing as tolerated Other Position/Activity Restrictions: Per Secure Chat with Orthopedics PA-C 1/12 recommended platform attachment      Mobility  Bed Mobility Overal bed mobility: Needs Assistance Bed Mobility: Supine to Sit     Supine to sit: Min assist, HOB elevated, Used rails     General bed mobility comments: Pt sat up on R side of bed with increased time. Cues for sequencing. Assist to manage RLE. Instructed pt to not put weight through right hand. Attempted to pull on bed rail and push down to scoot hips fwd. Use of bed pad to bring feet flat on floor.    Transfers Overall transfer level: Needs assistance Equipment used: Right platform walker Transfers: Sit to/from Stand, Bed to chair/wheelchair/BSC Sit to Stand: Mod assist Stand pivot transfers: Min assist         General transfer comment: Pt stood from lowest bed height. Cued proper hand placement using RW. Powered up with modA. Increased time reach upright posture. Heavy left lateral lean and limited WBing through RLE d/t pain. Transferred to recliner chair on her left positioned touching bed. Pt pivoted on her feet, no true steps or foot clearence noted. Assist to manage AD. Cues for sequencing.  Ambulation/Gait                  Stairs            Wheelchair Mobility     Tilt Bed    Modified Rankin (Stroke Patients Only)       Balance Overall balance assessment:  Needs assistance Sitting-balance support: No upper extremity supported, Feet supported Sitting balance-Leahy Scale: Good Sitting balance - Comments: Sat EOB   Standing balance support: Bilateral upper extremity supported, During functional activity Standing balance-Leahy Scale: Poor Standing balance comment: reliant on RW for support                             Pertinent Vitals/Pain Pain Assessment Pain Assessment: Faces Faces Pain Scale: Hurts little more Pain Location: RLE with activity Pain Descriptors / Indicators: Aching, Discomfort, Grimacing Pain Intervention(s): Monitored during session, Limited activity within patient's tolerance, Repositioned    Home Living Family/patient expects to be discharged to:: Private residence Living Arrangements: Spouse/significant other Available Help at Discharge: Family;Available 24 hours/day;Available PRN/intermittently (Husband who can provide assistance for ADLs and light transfers 24/7. Son can provide more heavy assistance after he gets off work at [employer].) Type of Home: House Home Access: Stairs to enter Entrance Stairs-Rails: None Secretary/administrator of Steps: 2+2   Home Layout: One level Home Equipment: Crutches;Hand held shower head Additional Comments: Pt lives at home with husband who can provide assistance for ADLs and light transfers. Son can provide more heavy assistance after he gets off work at [employer].    Prior Function Prior Level of Function : Independent/Modified Independent;Driving;History of Falls (last six months)             Mobility Comments: Ambulates without AD. Pt reports she doesn't get out of the house much. 1 fall leading to current admission. 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 Manages her own medications. Goes to cancer treatment every 2 weeks, on Friday and will take Monday off to help with recovery. She reports only 3 more session. Husband  drives.     Extremity/Trunk Assessment   Upper Extremity Assessment Upper Extremity Assessment: Defer to OT evaluation RUE Deficits / Details: R distal radius fx, WB through elbow only. Pt does well with grip/FM, supination/pronation, c/o mild pain RUE Sensation: WNL RUE Coordination: WNL    Lower Extremity Assessment Lower Extremity Assessment: RLE deficits/detail RLE Deficits / Details: Decreased AROM d/t pain. Pt resisted PROM attempts. Grossly 2+/5 hip strength; 3-/5 knee strength, and 3/5 ankle strength. RLE: Unable to fully assess due to pain RLE Sensation: WNL RLE Coordination: decreased gross motor    Cervical / Trunk Assessment Cervical / Trunk Assessment: Normal  Communication   Communication Communication: No apparent difficulties    Cognition Arousal: Alert Behavior During Therapy: WFL for tasks assessed/performed   PT - Cognitive impairments: No apparent impairments                       PT - Cognition Comments: Pt A,Ox4 Following commands: Intact       Cueing Cueing Techniques: Verbal cues, Gestural cues, Tactile cues     General Comments General comments (skin integrity, edema, etc.): Husband present and supportive throughout session. Discussed d/c disposition.    Exercises     Assessment/Plan    PT Assessment Patient needs continued PT services  PT Problem List Decreased strength;Decreased activity  tolerance;Decreased balance;Decreased mobility;Decreased coordination;Decreased knowledge of use of DME;Decreased safety awareness;Decreased knowledge of precautions;Pain       PT Treatment Interventions DME instruction;Gait training;Stair training;Functional mobility training;Therapeutic activities;Therapeutic exercise;Balance training;Cognitive remediation;Patient/family education;Wheelchair mobility training    PT Goals (Current goals can be found in the Care Plan section)  Acute Rehab PT Goals Patient Stated Goal: Regain independence PT  Goal Formulation: With patient/family Time For Goal Achievement: 12/14/24 Potential to Achieve Goals: Good    Frequency Min 2X/week     Co-evaluation               AM-PAC PT 6 Clicks Mobility  Outcome Measure Help needed turning from your back to your side while in a flat bed without using bedrails?: A Little Help needed moving from lying on your back to sitting on the side of a flat bed without using bedrails?: A Little Help needed moving to and from a bed to a chair (including a wheelchair)?: A Little Help needed standing up from a chair using your arms (e.g., wheelchair or bedside chair)?: A Lot Help needed to walk in hospital room?: Total Help needed climbing 3-5 steps with a railing? : Total 6 Click Score: 13    End of Session Equipment Utilized During Treatment: Gait belt Activity Tolerance: Patient tolerated treatment well;Patient limited by pain Patient left: in chair;with call bell/phone within reach;with chair alarm set;with family/visitor present Nurse Communication: Mobility status PT Visit Diagnosis: Difficulty in walking, not elsewhere classified (R26.2);Pain;Other abnormalities of gait and mobility (R26.89);Unsteadiness on feet (R26.81) Pain - Right/Left: Right Pain - part of body: Hip;Hand    Time: 8477-8443 PT Time Calculation (min) (ACUTE ONLY): 34 min   Charges:   PT Evaluation $PT Eval Moderate Complexity: 1 Mod   PT General Charges $$ ACUTE PT VISIT: 1 Visit         Randall SAUNDERS, PT, DPT Acute Rehabilitation Services Office: [phone] Secure Chat Preferred  Jacqueline Tyler 11/30/2024, 5:18 PM

## 2024-11-30 NOTE — Evaluation (Signed)
 Occupational Therapy Evaluation Patient Details Name: Jacqueline Tyler MRN: [id number] DOB: 1961-02-12 Today's Date: 11/30/2024   History of Present Illness   64 yo F adm 11/28/24 s/p fall with R femur fix and R wrist fx . Pt  s/p 11/29/24 IM rod R LE.PMH metastic pancreatic CA, HTN DM2,     Clinical Impressions Pt c/o mild pain to R hip with activity, R wrist with activity, husband present during session. Pt finishing with PT upon arrival, just got to recliner. Pt lives at home with 2+2 STE, lives with with husband who can provide light assistance with basic ADLs, transfers, has son who can assist after work at 6pm if needed. PLOF mod I with ADLs. Pt currently requires min A for UB ADLs, currently NWB to R hand/wrist, WB through elbow only. Pt mod A for LB ADLs. Pt needs mod A for STS, R platform RW for support, min A once standing to pivot to BSC. Pt would benefit from continued acute OT to maximize functional strength and independence with ADLs/mobility. Pt would benefit from w/c with cushion for return home, R platform RW, and BSC. Recommending HHOT follow up. Pt reports husband has truck that may be too high to get in, but son can pick up after work with car, and provide assistance for getting into home if needed with 2+2 steps to enter.      If plan is discharge home, recommend the following:   A lot of help with walking and/or transfers;A little help with bathing/dressing/bathroom;Assistance with cooking/housework;Assist for transportation;Help with stairs or ramp for entrance     Functional Status Assessment   Patient has had a recent decline in their functional status and demonstrates the ability to make significant improvements in function in a reasonable and predictable amount of time.     Equipment Recommendations   BSC/3in1;Wheelchair (measurements OT);Wheelchair cushion (measurements OT);Other (comment) (R platform RW)     Recommendations for Other Services          Precautions/Restrictions   Precautions Precautions: Fall Recall of Precautions/Restrictions: Intact Precaution/Restrictions Comments: RUE WB through elbow only Required Braces or Orthoses: Splint/Cast Splint/Cast: R wrist cock up Restrictions Weight Bearing Restrictions Per Provider Order: Yes RUE Weight Bearing Per Provider Order: Weight bear through elbow only RLE Weight Bearing Per Provider Order: Weight bearing as tolerated     Mobility Bed Mobility               General bed mobility comments: in recliner    Transfers Overall transfer level: Needs assistance Equipment used: Right platform walker Transfers: Sit to/from Stand, Bed to chair/wheelchair/BSC Sit to Stand: Mod assist     Step pivot transfers: Min assist     General transfer comment: mod A for STS, min A for stand pivot with R platform RW for support      Balance Overall balance assessment: Needs assistance Sitting-balance support: No upper extremity supported, Feet supported Sitting balance-Leahy Scale: Good     Standing balance support: Bilateral upper extremity supported, During functional activity Standing balance-Leahy Scale: Poor Standing balance comment: reliant on RW for support                           ADL either performed or assessed with clinical judgement   ADL Overall ADL's : Needs assistance/impaired Eating/Feeding: Set up;Sitting   Grooming: Set up;Sitting   Upper Body Bathing: Sitting;Minimal assistance   Lower Body Bathing: Moderate assistance;Sitting/lateral leans;Sit to/from stand  Upper Body Dressing : Minimal assistance;Sitting   Lower Body Dressing: Moderate assistance;Sitting/lateral leans;Sit to/from stand   Toilet Transfer: Minimal assistance;Moderate assistance;Rolling walker (2 wheels);BSC/3in1   Toileting- Clothing Manipulation and Hygiene: Minimal assistance;Sitting/lateral lean;Sit to/from stand       Functional mobility during ADLs:  Minimal assistance;Rolling walker (2 wheels) General ADL Comments: Pt with R distal radius fx, WB through elbow only, able to use R hand for light use such as holding spoon/fork, mild pain with supination, pronation, grip. Pt will need help for LB ADLs from husband. min A UB ADLs     Vision         Perception         Praxis         Pertinent Vitals/Pain Pain Assessment Pain Assessment: Faces Faces Pain Scale: Hurts little more Pain Location: RLE with activity Pain Descriptors / Indicators: Aching, Discomfort, Grimacing Pain Intervention(s): Monitored during session     Extremity/Trunk Assessment Upper Extremity Assessment Upper Extremity Assessment: Overall WFL for tasks assessed;RUE deficits/detail RUE Deficits / Details: R distal radius fx, WB through elbow only. Pt does well with grip/FM, supination/pronation, c/o mild pain RUE Sensation: WNL RUE Coordination: WNL   Lower Extremity Assessment Lower Extremity Assessment: Defer to PT evaluation       Communication Communication Communication: No apparent difficulties   Cognition Arousal: Alert Behavior During Therapy: WFL for tasks assessed/performed Cognition: No apparent impairments                               Following commands: Intact       Cueing  General Comments   Cueing Techniques: Verbal cues;Gestural cues;Tactile cues      Exercises     Shoulder Instructions      Home Living Family/patient expects to be discharged to:: Private residence Living Arrangements: Spouse/significant other Available Help at Discharge: Family;Available 24 hours/day Type of Home: House Home Access: Stairs to enter [employer] of Steps: 2+2 Entrance Stairs-Rails: None Home Layout: One level     Bathroom Shower/Tub: Walk-in shower;Tub/shower unit   Bathroom Toilet: Standard Bathroom Accessibility: Yes   Home Equipment: Crutches;Hand held shower head   Additional Comments: Pt lives  at home with husband who can provide assistance for ADLs and light transfers. Son can provide more heavy assistance after he gets off work at [employer].      Prior Functioning/Environment Prior Level of Function : Independent/Modified Independent;Driving;History of Falls (last six months)             Mobility Comments: Ambulates without AD. Pt reports she doesn't get out of the house much. 1 fall leading to current admission. 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 Manages her own medications. Goes to cancer treatment every 2 weeks, on Friday and will take Monday off to help with recovery. She reports only 3 more session. Husband dries.    OT Problem List: Decreased strength;Decreased range of motion;Decreased activity tolerance;Impaired balance (sitting and/or standing);Pain;Impaired UE functional use   OT Treatment/Interventions: Self-care/ADL training;Therapeutic exercise;Energy conservation;DME and/or AE instruction;Therapeutic activities;Patient/family education;Balance training      OT Goals(Current goals can be found in the care plan section)   Acute Rehab OT Goals Patient Stated Goal: to manage pain, improve strength OT Goal Formulation: With patient/family Time For Goal Achievement: 12/14/24 Potential to Achieve Goals: Good   OT Frequency:  Min 2X/week    Co-evaluation  AM-PAC OT 6 Clicks Daily Activity     Outcome Measure Help from another person eating meals?: A Little Help from another person taking care of personal grooming?: A Little Help from another person toileting, which includes using toliet, bedpan, or urinal?: A Little Help from another person bathing (including washing, rinsing, drying)?: A Lot Help from another person to put on and taking off regular upper body clothing?: A Little Help from another person to put on and taking off regular lower body clothing?: A Lot 6 Click Score: 16    End of Session Equipment Utilized During Treatment: Gait belt;Other (comment) (R platform RW) Nurse Communication: Mobility status  Activity Tolerance: Patient tolerated treatment well Patient left: in chair;with call bell/phone within reach;with chair alarm set  OT Visit Diagnosis: Unsteadiness on feet (R26.81);Other abnormalities of gait and mobility (R26.89);Muscle weakness (generalized) (M62.81);Pain Pain - Right/Left: Right Pain - part of body: Hip                Time: 8442-8374 OT Time Calculation (min): 28 min Charges:  OT General Charges $OT Visit: 1 Visit OT Evaluation $OT Eval Moderate Complexity: 1 Mod OT Treatments $Self Care/Home Management : 8-22 mins  [place], OTR/L   Jacqueline Tyler 11/30/2024, 4:35 PM

## 2024-11-30 NOTE — Progress Notes (Signed)
 OT Cancellation Note  Patient Details Name: Jacqueline Tyler MRN: [id number] DOB: [birth year]   Cancelled Treatment:    Reason Eval/Treat Not Completed: (P) Medical issues which prohibited therapy, still waiting on transfusion, will return as time allows  [name] 11/30/2024, 1:00 PM

## 2024-11-30 NOTE — Progress Notes (Signed)
 " PROGRESS NOTE    Jacqueline Tyler  FMW:[id number] DOB: November 02, 1961 DOA: 11/28/2024 PCP: [name], DO   Brief Narrative:  Jacqueline Tyler is a 64 y.o. female who presents to our facility after mechanical fall -patient tripped over her dog.  Unable to ambulate thereafter due to severe pain and presented to our facility with notable fracture of her right femur, orthopedics called in consult, hospitalist called for admission.  Of note patient has comorbid conditions including advanced metastatic pancreatic cancer currently on chemotherapy as well as diabetes  Assessment & Plan:   Principal Problem:   Hip fracture (HCC) Active Problems:   Type 2 diabetes mellitus without complications (HCC)   Pancreatic cancer (HCC)  Acute mechanical fall with subsequent right femur fracture, intertrochanteric with varus angulation  - Status post fixation with intramedullary rod on 11/29/2024  - Resume diet, pain currently well-controlled -PT OT to follow, tentative disposition SNF given age and comorbidities  Acute on chronic symptomatic anemia  Anemia of chronic disease exacerbated by likely blood loss anemia -Hemoglobin 6.7 overnight, confirmed below with repeat at 7, 1 unit PRBC pending transfusion  Right wrist fracture - Mildly impacted fracture of the distal right radial metaphysis - Nonoperative treatment -continue brace per orthopedics  Metastatic pancreatic cancer Patient sees Dr. Lanny.  Actively receiving treatment, last chemotherapy 11/26/2024.  Port in place. Patient expresses desire to be full code.   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  -restart Creon  and advance diet postoperatively Diabetes mellitus secondary to pancreatic cancer -continue sliding scale insulin , hypoglycemic protocol  DVT prophylaxis:  apixaban  (ELIQUIS ) tablet 2.5 mg Start: 11/30/24 1000 SCDs Start: 11/29/24 0943 Place TED hose Start: 11/28/24 2053 apixaban  (ELIQUIS ) tablet 2.5 mg Code Status:   Code Status: Full Code Family Communication: Husband at bedside  Status is: Inpatient  Dispo: The patient is from: Home              Anticipated d/c is to: To be determined, being evaluated for SNF              Anticipated d/c date is: 48 to 72 hours              Patient currently not medically stable for discharge given recent surgery need for PT evaluation and pain management  Consultants:  Orthopedic surgery  Procedures:  Right femur intramedullary rod placement 11/29/2024  Antimicrobials:  Perioperatively  Subjective: No acute issues or events overnight denies nausea vomiting diarrhea constipation high fevers chills or chest pain.  Left hip pain markedly improved postoperatively denies any distal paresthesias or numbness  Objective: Vitals:   11/29/24 1435 11/29/24 1942 11/29/24 2346 11/30/24 0334  BP: (!) 130/59 109/87 (!) 102/52 (!) 120/57  Pulse: 79 83 66 73  Resp: 16 18 17 18   Temp:  98 F (36.7 C) 97.8 F (36.6 C) 97.8 F (36.6 C)  TempSrc:      SpO2: 98% 100% 100% 100%  Weight:      Height:        Intake/Output Summary (Last 24 hours) at 11/30/2024 0810 Last data filed at 11/29/2024 1849 Gross per 24 hour  Intake 1280 ml  Output 100 ml  Net 1180 ml   Filed Weights   11/28/24 1934  Weight: 50.7 kg    Examination:  General:  Pleasantly resting in bed, No acute distress. HEENT:  Normocephalic atraumatic.  Sclerae nonicteric, noninjected.  Extraocular movements intact bilaterally. Neck:  Without mass or deformity.  Trachea is midline. Lungs:  Clear to auscultate bilaterally without rhonchi, wheeze, or rales. Heart:  Regular rate and rhythm.  Without murmurs, rubs, or gallops. Abdomen:  Soft, nontender, nondistended.  Without guarding or rebound. Extremities: Without cyanosis, clubbing, edema,  or obvious deformity. Skin:  Warm and dry, no erythema.  Data Reviewed: I have personally reviewed following labs and imaging studies  CBC: Recent Labs  Lab 11/26/24 1030 11/28/24 1828 11/29/24 1124 11/30/24 0537  WBC 6.3 13.8* 10.7* 6.9  NEUTROABS 4.5 13.5*  --   --   HGB 9.4* 9.0* 8.3* 6.7*  HCT 29.0* 27.4* 25.1* 21.1*  MCV 99.0 100.7* 100.8* 101.9*  PLT 273 284 260 263   Basic Metabolic Panel: Recent Labs  Lab 11/26/24 1030 11/28/24 1828 11/29/24 1124 11/30/24 0537  NA 137 134* 131* 133*  K 4.4 3.7 4.4 4.4  CL 104 99 97* 98  CO2 26 25 24 28   GLUCOSE 111* 122* 139* 168*  BUN 7* 14 15 15   CREATININE 0.84 0.84 0.88 0.87  CALCIUM  8.3* 8.5* 8.3* 8.2*   GFR: Estimated Creatinine Clearance: 49.3 mL/min (by C-G formula based on SCr of 0.87 mg/dL). Liver Function Tests: Recent Labs  Lab 11/26/24 1030 11/28/24 1828 11/29/24 1124 11/30/24 0537  AST 39 54* 45* 39  ALT 24 35 33 30  ALKPHOS 140* 218* 166* 142*  BILITOT 0.6 1.2 0.8 0.4  PROT 6.3* 6.3* 5.8* 5.4*  ALBUMIN 3.3* 3.1* 2.8* 2.7*   No results for input(s): LIPASE, AMYLASE in the last 168 hours. No results for input(s): AMMONIA in the last 168 hours. Coagulation Profile: Recent Labs  Lab 11/29/24 1124 11/30/24 0537  INR 1.1 1.0   Cardiac Enzymes: No results for input(s): CKTOTAL, CKMB, CKMBINDEX, TROPONINI in the last 168 hours. BNP (last 3 results) No results for input(s): PROBNP in the last 8760 hours. HbA1C: Recent Labs    11/29/24 1123  HGBA1C 4.8   CBG: Recent Labs  Lab 11/29/24 1542 11/29/24 2050 11/29/24 2120 11/29/24 2347 11/30/24 0352  GLUCAP 210* 197* 203* 194* 166*   Lipid Profile: No results for input(s): CHOL, HDL, LDLCALC, TRIG, CHOLHDL, LDLDIRECT in the last 72 hours. Thyroid Function Tests: No results for input(s): TSH, T4TOTAL, FREET4, T3FREE, THYROIDAB in the last 72 hours. Anemia Panel: No results for input(s): VITAMINB12,  FOLATE, FERRITIN, TIBC, IRON, RETICCTPCT in the last 72 hours. Sepsis Labs: No results for input(s): PROCALCITON, LATICACIDVEN in the last 168 hours.  Recent Results (from the past 240 hours)  Surgical PCR screen     Status: None   Collection Time: 11/28/24 10:22 PM   Specimen: Nasal Mucosa; Nasal Swab  Result Value Ref Range Status   MRSA, PCR NEGATIVE NEGATIVE Final   Staphylococcus aureus NEGATIVE NEGATIVE Final    Comment: (NOTE) The Xpert SA Assay (FDA approved for NASAL specimens in patients 76 years of age and older), is one component of a comprehensive surveillance program. It is not intended to diagnose infection nor to guide or monitor treatment. Performed at [facility], [address] [place], [place] [address]          Radiology Studies: DG HIP UNILAT WITH PELVIS 2-3 VIEWS RIGHT Result Date: 11/29/2024 CLINICAL DATA:  Elective surgery. EXAM: DG HIP (WITH OR WITHOUT PELVIS) 2-3V RIGHT COMPARISON:  Preoperative imaging FINDINGS: Three fluoroscopic spot views of the hip submitted from the operating room. Femoral intramedullary nail  with trans trochanteric and distal locking screw fixation traverse proximal femur fracture. Fluoroscopy time 58 seconds. Dose 5.55 mGy. IMPRESSION: Intraoperative fluoroscopy during proximal femur fracture ORIF. Electronically Signed   By: Andrea Gasman M.D.   On: 11/29/2024 11:37   DG C-Arm 1-60 Min-No Report Result Date: 11/29/2024 Fluoroscopy was utilized by the requesting physician.  No radiographic interpretation.   DG Wrist Complete Right Result Date: 11/28/2024 EXAM: 3 OR MORE VIEW(S) XRAY OF THE WRIST 11/28/2024 05:19:16 PM COMPARISON: None available. CLINICAL HISTORY: fall FINDINGS: BONES AND JOINTS: Mildly impacted fracture of distal radial metaphysis. Neutral tilt of the distal radial articular surface. No dislocation. No malalignment. Mild osteopenia. SOFT TISSUES: Mild soft tissue swelling. IMPRESSION: 1.  Mildly impacted fracture of the distal radial metaphysis with neutral tilt of the distal radial articular surface. No dislocation. 2. Mild soft tissue swelling. Electronically signed by: Dorethia Molt MD MD 11/28/2024 05:38 PM EST RP Workstation: [id number]   DG Hip Unilat  With Pelvis 2-3 Views Right Result Date: 11/28/2024 EXAM: 2 OR MORE VIEW(S) XRAY OF THE RIGHT HIP 11/28/2024 05:19:16 PM COMPARISON: None available. CLINICAL HISTORY: severe pain per pt, fall FINDINGS: BONES AND JOINTS: Intertrochanteric fracture of the right hip with varus angulation. SOFT TISSUES: Large rectal stool ball. IMPRESSION: 1. Intertrochanteric fracture of the right hip with varus angulation. 2. Large rectal stool ball. Electronically signed by: Dorethia Molt MD MD 11/28/2024 05:37 PM EST RP Workstation: [id number]        Scheduled Meds:  apixaban   2.5 mg Oral BID   busPIRone   5 mg Oral BID   Chlorhexidine  Gluconate Cloth  6 each Topical Daily   citalopram   40 mg Oral QPM   docusate sodium   100 mg Oral BID   insulin  aspart  0-6 Units Subcutaneous Q4H   lipase/protease/amylase  36,000 Units Oral TID AC   Continuous Infusions:     LOS: 2 days    Time spent:    Elsie JAYSON Montclair, DO [facility]  If 7PM-7AM, please contact night-coverage [email or web address]  11/30/2024, 8:10 AM      "

## 2024-11-30 NOTE — Progress Notes (Signed)
 PT Cancellation Note  Patient Details Name: Jacqueline Tyler MRN: [id number] DOB: 1961-11-03   Cancelled Treatment:    Reason Eval/Treat Not Completed: Medical issues which prohibited therapy (hgb 6.7; no blood transfusion yet).  Aleck Daring, PT, DPT Acute Rehabilitation Services Office [phone]    Aleck ONEIDA Daring 11/30/2024, 10:05 AM

## 2024-11-30 NOTE — Progress Notes (Signed)
 PATIENT ID: Jacqueline Tyler  MRN: [id number]  DOB/AGE:  12/16/1960 / 64 y.o.  1 Day Post-Op Procedures (LRB): FIXATION, FRACTURE, INTERTROCHANTERIC, WITH INTRAMEDULLARY ROD (Right)  Subjective: Pain is mild.  No c/o chest pain or SOB. She reports that she is feeling ok, little tired. Resting in bed with husband in the room. She is eager to get home.   Objective: Vital signs in last 24 hours: Temp:  [97.4 F (36.3 C)-98 F (36.7 C)] 97.6 F (36.4 C) (01/12 0820) Pulse Rate:  [66-88] 68 (01/12 0820) Resp:  [11-18] 15 (01/12 0820) BP: (102-146)/(52-87) 111/54 (01/12 0820) SpO2:  [92 %-100 %] 100 % (01/12 0820)  Intake/Output from previous day: 01/11 0701 - 01/12 0700 In: 1280 [P.O.:480; I.V.:800] Out: 100 [Blood:100]   Recent Labs    11/28/24 1828 11/29/24 1124 11/30/24 0537  HGB 9.0* 8.3* 6.7*   Recent Labs    11/29/24 1124 11/30/24 0537  WBC 10.7* 6.9  RBC 2.49* 2.07*  HCT 25.1* 21.1*  PLT 260 263   Recent Labs    11/29/24 1124 11/30/24 0537  NA 131* 133*  K 4.4 4.4  CL 97* 98  CO2 24 28  BUN 15 15  CREATININE 0.88 0.87  GLUCOSE 139* 168*  CALCIUM  8.3* 8.2*   Recent Labs    11/29/24 1124 11/30/24 0537  INR 1.1 1.0    Physical Exam: Neurologically intact Sensation intact distally Intact pulses distally Dorsiflexion/Plantar flexion intact Incision: dressing C/D/I No cellulitis present Compartment soft  Assessment/Plan: 1 Day Post-Op Procedures (LRB): FIXATION, FRACTURE, INTERTROCHANTERIC, WITH INTRAMEDULLARY ROD (Right)   Advance diet Up with therapy when medically stable, will likely require blood transfusion today Weight Bearing as Tolerated (WBAT) RLE VTE prophylaxis: Eliquis  2.5mg  BID  Patient to follow up in office in 2 weeks for xrays and staple removal   Today's  total administered Morphine  Milligram Equivalents: 0 Yesterday's total administered Morphine  Milligram Equivalents: 42  Nazaire Cordial L. Porterfield, PA-C 11/30/2024, 8:43 AM

## 2024-11-30 NOTE — Progress Notes (Signed)
 Critical lab was called to nightshift RN, reported HGB 6.7. RN reported critical lab to Isaiah Lever, NP at 06:39AM, NP responded with I ordered a confirmatory h/h but suspect she will need blood, RN relayed information to dayshift RN.

## 2024-12-01 ENCOUNTER — Other Ambulatory Visit (HOSPITAL_COMMUNITY): Payer: Self-pay

## 2024-12-01 ENCOUNTER — Encounter: Payer: Self-pay | Admitting: Hematology

## 2024-12-01 DIAGNOSIS — E119 Type 2 diabetes mellitus without complications: Secondary | ICD-10-CM | POA: Diagnosis not present

## 2024-12-01 DIAGNOSIS — C250 Malignant neoplasm of head of pancreas: Secondary | ICD-10-CM | POA: Diagnosis not present

## 2024-12-01 DIAGNOSIS — S72001A Fracture of unspecified part of neck of right femur, initial encounter for closed fracture: Secondary | ICD-10-CM | POA: Diagnosis not present

## 2024-12-01 LAB — TYPE AND SCREEN
ABO/RH(D): A POS
Antibody Screen: NEGATIVE
Unit division: 0

## 2024-12-01 LAB — GLUCOSE, CAPILLARY
Glucose-Capillary: 95 mg/dL (ref 70–99)
Glucose-Capillary: 98 mg/dL (ref 70–99)
Glucose-Capillary: 102 mg/dL — ABNORMAL HIGH (ref 70–99)
Glucose-Capillary: 125 mg/dL — ABNORMAL HIGH (ref 70–99)
Glucose-Capillary: 106 mg/dL — ABNORMAL HIGH (ref 70–99)

## 2024-12-01 LAB — BPAM RBC
Blood Product Expiration Date: 202602112359
ISSUE DATE / TIME: 202601121403
Unit Type and Rh: 600

## 2024-12-01 MED ORDER — APIXABAN 2.5 MG PO TABS: 2.5000 mg | ORAL_TABLET | Freq: Two times a day (BID) | ORAL | 0 refills | Status: DC

## 2024-12-01 MED ORDER — OXYCODONE HCL 5 MG PO TABS
5.0000 mg | ORAL_TABLET | Freq: Four times a day (QID) | ORAL | 0 refills | Status: DC | PRN
  Filled 2024-12-01 (×2): qty 20, 5d supply, fill #0

## 2024-12-01 MED ORDER — APIXABAN 2.5 MG PO TABS
2.5000 mg | ORAL_TABLET | Freq: Two times a day (BID) | ORAL | 0 refills | Status: DC
  Filled 2024-12-01 (×2): qty 40, 20d supply, fill #0

## 2024-12-01 NOTE — Progress Notes (Signed)
 " PROGRESS NOTE    Jacqueline Tyler  FMW:[id number] DOB: 01-14-1961 DOA: 11/28/2024 PCP: [name], DO   Brief Narrative:  Jacqueline Tyler is a 64 y.o. female who presents to our facility after mechanical fall -patient tripped over her dog.  Unable to ambulate thereafter due to severe pain and presented to our facility with notable fracture of her right femur, orthopedics called in consult, hospitalist called for admission.  Of note patient has comorbid conditions including advanced metastatic pancreatic cancer currently on chemotherapy as well as diabetes  Assessment & Plan:   Principal Problem:   Hip fracture (HCC) Active Problems:   Type 2 diabetes mellitus without complications (HCC)   Pancreatic cancer (HCC)  Acute mechanical fall with subsequent right femur fracture, intertrochanteric with varus angulation  - Status post fixation with intramedullary rod on 11/29/2024  - Pain currently well-controlled - PT/OT to follow, tentative disposition SNF given age, comorbidities, and recent surgery  Acute on chronic symptomatic anemia, improved Anemia of chronic disease exacerbated by likely blood loss anemia - Hemoglobin 6.7 1/12 -status post 1 unit PRBC - Hemoglobin stabilizing, no signs or symptoms of bleeding at this time  Right wrist fracture - Mildly impacted fracture of the distal right radial metaphysis - Nonoperative treatment -continue brace per orthopedics  Metastatic pancreatic cancer Patient sees Dr. Lanny.  Actively receiving treatment, last chemotherapy 11/26/2024.  Port in place. Patient expresses desire to be full code.   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  -restart Creon  and advance diet postoperatively Diabetes mellitus secondary to pancreatic cancer -continue sliding scale insulin ,  hypoglycemic protocol  DVT prophylaxis: apixaban  (ELIQUIS ) tablet 2.5 mg Start: 11/30/24 1000 SCDs Start: 11/29/24 0943 Place TED hose Start: 11/28/24 2053 apixaban  (ELIQUIS ) tablet 2.5 mg Code Status:   Code Status: Full Code Family Communication: Husband at bedside  Status is: Inpatient  Dispo: The patient is from: Home              Anticipated d/c is to: To be determined, being evaluated for SNF              Anticipated d/c date is: 48 to 72 hours              Patient currently not medically stable for discharge given recent surgery need for PT evaluation and pain management  Consultants:  Orthopedic surgery  Procedures:  Right femur intramedullary rod placement 11/29/2024  Antimicrobials:  Perioperatively  Subjective: No acute issues or events overnight denies nausea vomiting diarrhea constipation high fevers chills or chest pain.  Left hip pain markedly improved postoperatively denies any distal paresthesias or numbness  Objective: Vitals:   11/30/24 1936 12/01/24 0333 12/01/24 0401 12/01/24 0717  BP: (!) 112/51 (!) 124/54 (!) 114/55 126/64  Pulse: 77 79  74  Resp: 17 16  16   Temp: 98 F (36.7 C) 98.2 F (36.8 C)  98 F (36.7 C)  TempSrc: Oral Oral  Oral  SpO2: 98% 91%  93%  Weight:      Height:        Intake/Output Summary (Last 24 hours) at 12/01/2024 0728 Last data filed at 11/30/2024 1715 Gross per 24 hour  Intake 492 ml  Output --  Net 492 ml   Filed Weights   11/28/24 1934  Weight: 50.7 kg    Examination:  General:  Pleasantly resting in bed, No acute  distress. HEENT:  Normocephalic atraumatic.  Sclerae nonicteric, noninjected.  Extraocular movements intact bilaterally. Neck:  Without mass or deformity.  Trachea is midline. Lungs:  Clear to auscultate bilaterally without rhonchi, wheeze, or rales. Heart:  Regular rate and rhythm.  Without murmurs, rubs, or gallops. Abdomen:  Soft, nontender, nondistended.  Without guarding or  rebound. Extremities: Without cyanosis, clubbing, edema, or obvious deformity. Skin:  Warm and dry, no erythema.  Data Reviewed: I have personally reviewed following labs and imaging studies  CBC: Recent Labs  Lab 11/26/24 1030 11/28/24 1828 11/29/24 1124 11/30/24 0537 11/30/24 0828 11/30/24 2019  WBC 6.3 13.8* 10.7* 6.9  --   --   NEUTROABS 4.5 13.5*  --   --   --   --   HGB 9.4* 9.0* 8.3* 6.7* 7.0* 8.3*  HCT 29.0* 27.4* 25.1* 21.1* 21.6* 25.8*  MCV 99.0 100.7* 100.8* 101.9*  --   --   PLT 273 284 260 263  --   --    Basic Metabolic Panel: Recent Labs  Lab 11/26/24 1030 11/28/24 1828 11/29/24 1124 11/30/24 0537  NA 137 134* 131* 133*  K 4.4 3.7 4.4 4.4  CL 104 99 97* 98  CO2 26 25 24 28   GLUCOSE 111* 122* 139* 168*  BUN 7* 14 15 15   CREATININE 0.84 0.84 0.88 0.87  CALCIUM  8.3* 8.5* 8.3* 8.2*   GFR: Estimated Creatinine Clearance: 49.3 mL/min (by C-G formula based on SCr of 0.87 mg/dL). Liver Function Tests: Recent Labs  Lab 11/26/24 1030 11/28/24 1828 11/29/24 1124 11/30/24 0537  AST 39 54* 45* 39  ALT 24 35 33 30  ALKPHOS 140* 218* 166* 142*  BILITOT 0.6 1.2 0.8 0.4  PROT 6.3* 6.3* 5.8* 5.4*  ALBUMIN 3.3* 3.1* 2.8* 2.7*   No results for input(s): LIPASE, AMYLASE in the last 168 hours. No results for input(s): AMMONIA in the last 168 hours. Coagulation Profile: Recent Labs  Lab 11/29/24 1124 11/30/24 0537  INR 1.1 1.0   Cardiac Enzymes: No results for input(s): CKTOTAL, CKMB, CKMBINDEX, TROPONINI in the last 168 hours. BNP (last 3 results) No results for input(s): PROBNP in the last 8760 hours. HbA1C: Recent Labs    11/29/24 1123  HGBA1C 4.8   CBG: Recent Labs  Lab 11/30/24 1137 11/30/24 1525 11/30/24 1937 11/30/24 2316 12/01/24 0335  GLUCAP 115* 111* 115* 101* 95    Recent Results (from the past 240 hours)  Surgical PCR screen     Status: None   Collection Time: 11/28/24 10:22 PM   Specimen: Nasal Mucosa; Nasal  Swab  Result Value Ref Range Status   MRSA, PCR NEGATIVE NEGATIVE Final   Staphylococcus aureus NEGATIVE NEGATIVE Final    Comment: (NOTE) The Xpert SA Assay (FDA approved for NASAL specimens in patients 30 years of age and older), is one component of a comprehensive surveillance program. It is not intended to diagnose infection nor to guide or monitor treatment. Performed at [facility], [address], [place], [place] [address]     Radiology Studies: DG HIP UNILAT WITH PELVIS 2-3 VIEWS RIGHT Result Date: 11/29/2024 CLINICAL DATA:  Elective surgery. EXAM: DG HIP (WITH OR WITHOUT PELVIS) 2-3V RIGHT COMPARISON:  Preoperative imaging FINDINGS: Three fluoroscopic spot views of the hip submitted from the operating room. Femoral intramedullary nail with trans trochanteric and distal locking screw fixation traverse proximal femur fracture. Fluoroscopy time 58 seconds. Dose 5.55 mGy. IMPRESSION: Intraoperative fluoroscopy during proximal femur fracture ORIF. Electronically Signed  By: Andrea Gasman M.D.   On: 11/29/2024 11:37   DG C-Arm 1-60 Min-No Report Result Date: 11/29/2024 Fluoroscopy was utilized by the requesting physician.  No radiographic interpretation.   Scheduled Meds:  sodium chloride    Intravenous Once   apixaban   2.5 mg Oral BID   busPIRone   5 mg Oral BID   Chlorhexidine  Gluconate Cloth  6 each Topical Daily   citalopram   40 mg Oral QPM   docusate sodium   100 mg Oral BID   insulin  aspart  0-6 Units Subcutaneous Q4H   lipase/protease/amylase  36,000 Units Oral TID AC   Continuous Infusions:     LOS: 3 days    Time spent:    Elsie JAYSON Montclair, DO [facility]  If 7PM-7AM, please contact night-coverage [email or web address]  12/01/2024, 7:28 AM      "

## 2024-12-01 NOTE — Progress Notes (Signed)
 PATIENT ID: Jacqueline Tyler  MRN: [id number]  DOB/AGE:  [birth year] / 64 y.o.  2 Days Post-Op Procedures (LRB): FIXATION, FRACTURE, INTERTROCHANTERIC, WITH INTRAMEDULLARY ROD (Right)  Subjective: Patient reports that she is feeling good. Minimal pain in the right hip. Eager to work with therapy and get home.   Objective: Vital signs in last 24 hours: Temp:  [97.5 F (36.4 C)-98.5 F (36.9 C)] 98 F (36.7 C) (01/13 0717) Pulse Rate:  [70-79] 74 (01/13 0717) Resp:  [16-17] 16 (01/13 0717) BP: (100-126)/(48-65) 126/64 (01/13 0717) SpO2:  [91 %-100 %] 93 % (01/13 0717)  Intake/Output from previous day: 01/12 0701 - 01/13 0700 In: 492 [P.O.:120; Blood:372] Out: -  Intake/Output this shift: Total I/O In: 120 [P.O.:120] Out: -   Recent Labs    11/28/24 1828 11/29/24 1124 11/30/24 0537 11/30/24 0828 11/30/24 2019  HGB 9.0* 8.3* 6.7* 7.0* 8.3*   Recent Labs    11/29/24 1124 11/30/24 0537 11/30/24 0828 11/30/24 2019  WBC 10.7* 6.9  --   --   RBC 2.49* 2.07*  --   --   HCT 25.1* 21.1* 21.6* 25.8*  PLT 260 263  --   --    Recent Labs    11/29/24 1124 11/30/24 0537  NA 131* 133*  K 4.4 4.4  CL 97* 98  CO2 24 28  BUN 15 15  CREATININE 0.88 0.87  GLUCOSE 139* 168*  CALCIUM  8.3* 8.2*   Recent Labs    11/29/24 1124 11/30/24 0537  INR 1.1 1.0    Physical Exam: Neurologically intact Sensation intact distally Intact pulses distally Dorsiflexion/Plantar flexion intact Incision: dressing C/D/I No cellulitis present Compartment soft  Assessment/Plan: 2 Days Post-Op Procedures (LRB): FIXATION, FRACTURE, INTERTROCHANTERIC, WITH INTRAMEDULLARY ROD (Right)  Advance diet Up with therapy  Weight Bearing as Tolerated (WBAT) RLE VTE prophylaxis: Eliquis  2.5mg  BID   Patient to follow up in office in 2 weeks for xrays and staple removal. Hopeful for DC home with HHPT in the next 24-48 hrs   Today's  total administered Morphine  Milligram Equivalents: 6 Yesterday's total  administered Morphine  Milligram Equivalents: 12  Jacqueline Korpela L. Porterfield, PA-C 12/01/2024, 1:11 PM

## 2024-12-01 NOTE — Plan of Care (Signed)
" °  Problem: Bowel/Gastric: Goal: Gastrointestinal status for postoperative course will improve Outcome: Progressing   Problem: Cardiac: Goal: Ability to maintain an adequate cardiac output Outcome: Progressing Goal: Will show no evidence of cardiac arrhythmias Outcome: Progressing   Problem: Nutritional: Goal: Will attain and maintain optimal nutritional status Outcome: Progressing   Problem: Neurological: Goal: Will regain or maintain usual level of consciousness Outcome: Progressing   "

## 2024-12-02 ENCOUNTER — Other Ambulatory Visit (HOSPITAL_COMMUNITY): Payer: Self-pay

## 2024-12-02 DIAGNOSIS — S52501A Unspecified fracture of the lower end of right radius, initial encounter for closed fracture: Secondary | ICD-10-CM

## 2024-12-02 LAB — GLUCOSE, CAPILLARY
Glucose-Capillary: 108 mg/dL — ABNORMAL HIGH (ref 70–99)
Glucose-Capillary: 93 mg/dL (ref 70–99)
Glucose-Capillary: 112 mg/dL — ABNORMAL HIGH (ref 70–99)
Glucose-Capillary: 150 mg/dL — ABNORMAL HIGH (ref 70–99)

## 2024-12-02 MED ORDER — ACETAMINOPHEN 325 MG PO TABS
650.0000 mg | ORAL_TABLET | Freq: Four times a day (QID) | ORAL | Status: DC | PRN
  Administered 2024-12-02 (×2): 650 mg via ORAL
  Filled 2024-12-02 (×2): qty 2

## 2024-12-02 MED ORDER — OXYCODONE HCL 5 MG PO TABS: 5.0000 mg | ORAL_TABLET | Freq: Four times a day (QID) | ORAL | 0 refills | Status: DC | PRN

## 2024-12-02 MED ORDER — APIXABAN 2.5 MG PO TABS: 2.5000 mg | ORAL_TABLET | Freq: Two times a day (BID) | ORAL | 0 refills | Status: DC

## 2024-12-02 NOTE — Progress Notes (Signed)
 PATIENT ID: Jacqueline Tyler  MRN: [id number]  DOB/AGE:  [birth year] / 64 y.o.  3 Days Post-Op Procedures (LRB): FIXATION, FRACTURE, INTERTROCHANTERIC, WITH INTRAMEDULLARY ROD (Right)  Subjective: Patient reports minimal pain in the right hip. Resting in room comfortably. She reports frustration with not having PT yesterday. .    Objective: Vital signs in last 24 hours: Temp:  [97.9 F (36.6 C)-98.3 F (36.8 C)] 98.2 F (36.8 C) (01/14 0735) Pulse Rate:  [69-89] 69 (01/14 0735) Resp:  [16-18] 16 (01/14 0735) BP: (118-150)/(62-72) 118/63 (01/14 0735) SpO2:  [95 %-96 %] 95 % (01/14 0735)  Intake/Output from previous day: 01/13 0701 - 01/14 0700 In: 120 [P.O.:120] Out: -  Intake/Output this shift: No intake/output data recorded.  Recent Labs    11/29/24 1124 11/30/24 0537 11/30/24 0828 11/30/24 2019  HGB 8.3* 6.7* 7.0* 8.3*   Recent Labs    11/29/24 1124 11/30/24 0537 11/30/24 0828 11/30/24 2019  WBC 10.7* 6.9  --   --   RBC 2.49* 2.07*  --   --   HCT 25.1* 21.1* 21.6* 25.8*  PLT 260 263  --   --    Recent Labs    11/29/24 1124 11/30/24 0537  NA 131* 133*  K 4.4 4.4  CL 97* 98  CO2 24 28  BUN 15 15  CREATININE 0.88 0.87  GLUCOSE 139* 168*  CALCIUM  8.3* 8.2*   Recent Labs    11/29/24 1124 11/30/24 0537  INR 1.1 1.0    Physical Exam: Neurologically intact Sensation intact distally Intact pulses distally Dorsiflexion/Plantar flexion intact Incision: dressing C/D/I No cellulitis present Compartment soft  Assessment/Plan: 3 Days Post-Op Procedures (LRB): FIXATION, FRACTURE, INTERTROCHANTERIC, WITH INTRAMEDULLARY ROD (Right)   Advance diet Up with therapy  Weight Bearing as Tolerated (WBAT) RLE VTE prophylaxis: Eliquis  2.5mg  BID   Patient to follow up in office in 2 weeks for xrays and staple removal. Hopeful for DC home with HHPT vs SNF in the next 24-48 hrs. Patient needs to continue PT. Orders refreshed. Unsure of reasoning why PT was not performed  yesterday. Will discuss with treatment team.  Oxycodone  and Eliquis  orders have been sent to [facility] pharmacy but have also been printed and placed on chart in case patient goes to SNF   Today's  total administered Morphine  Milligram Equivalents: 6 Yesterday's total administered Morphine  Milligram Equivalents: 12  Jacqueline Tyler 12/02/2024, 8:09 AM

## 2024-12-02 NOTE — Progress Notes (Signed)
 "  Jacqueline Tyler  FMW:[id number] DOB: 06-27-1961 DOA: 11/28/2024 PCP: [name], DO    Brief Narrative:  64 year old with a history of advanced metastatic pancreatic cancer, DM2, HLD, HTN, and prior CVA who presented to the ER 1/10 after tripping over her dog led to a mechanical fall.  Evaluation in the ER revealed an acute right femur fracture and a mildly impacted fracture of the distal right radial metaphysis..  Goals of Care:   Code Status: Full Code   DVT prophylaxis: apixaban  (ELIQUIS ) tablet 2.5 mg Start: 11/30/24 1000 SCDs Start: 11/29/24 0943 Place TED hose Start: 11/28/24 2053 apixaban  (ELIQUIS ) tablet 2.5 mg   Interim Hx: No acute events reported overnight.  Anxious to get home.  Vital signs stable.  Afebrile.  Resting comfortably the time of my visit.  Assessment & Plan:  Right femur fracture due to mechanical fall Status post fixation with intramedullary rod 11/29/2024 -postoperative care per Orthopedic Surgery as follows: Weight Bearing as Tolerated (WBAT) RLE VTE prophylaxis: Eliquis  2.5mg  BID Patient to follow up in office in 2 weeks for xrays and staple removal. Hopeful for DC home with HHPT in the next 24-48 hrs  Acute on chronic symptomatic anemia -acute blood loss anemia due to femur fracture Hemoglobin reached nadir of 6.7 on 1/12 requiring 1 unit PRBC -hemoglobin presently stable  Right wrist fracture Mildly impacted fracture of the distal right radial metaphysis -nonoperative management per orthopedic surgery  Metastatic pancreatic cancer Followed by Dr. 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 rate 16, height 5' 1 (1.549 m), weight 50.7 kg, SpO2 95%.  Intake/Output Summary (Last 24 hours) at 12/02/2024 1027 Last data filed at 12/01/2024 1033 Gross per 24 hour  Intake 120 ml  Output --  Net 120 ml   Filed Weights   11/28/24 1934  Weight: 50.7 kg    Examination: General: No acute respiratory distress Lungs: Clear to auscultation bilaterally without wheezes or crackles Cardiovascular: Regular rate and rhythm without murmur gallop or rub normal S1 and S2 Abdomen: Nontender, nondistended, soft, bowel sounds positive, no rebound, no ascites, no appreciable mass Extremities: No significant cyanosis, clubbing, or edema bilateral lower extremities  CBC: Recent Labs  Lab 11/26/24 1030 11/26/24 1030 11/28/24 1828 11/29/24 1124 11/30/24 0537 11/30/24 0828 11/30/24 2019  WBC 6.3  --  13.8* 10.7* 6.9  --   --   NEUTROABS 4.5  --  13.5*  --   --   --   --   HGB 9.4*   < > 9.0* 8.3* 6.7* 7.0* 8.3*  HCT 29.0*  --  27.4* 25.1* 21.1* 21.6* 25.8*  MCV 99.0  --  100.7* 100.8* 101.9*  --   --   PLT 273  --  284 260 263  --   --    < > = values in this interval not displayed.   Basic Metabolic Panel: Recent Labs  Lab 11/28/24 1828 11/29/24 1124 11/30/24 0537  NA 134* 131* 133*  K 3.7 4.4 4.4  CL 99 97* 98  CO2 25 24 28   GLUCOSE 122* 139* 168*  BUN 14 15 15  CREATININE 0.84 0.88 0.87  CALCIUM  8.5* 8.3* 8.2*   GFR: Estimated Creatinine Clearance: 49.3 mL/min (by C-G formula based on SCr of 0.87 mg/dL).   Scheduled Meds:  sodium chloride    Intravenous Once   apixaban   2.5 mg Oral BID   busPIRone   5 mg Oral BID   Chlorhexidine  Gluconate Cloth  6 each Topical Daily   citalopram   40 mg Oral QPM   docusate sodium   100 mg Oral BID   insulin  aspart  0-6 Units Subcutaneous Q4H   lipase/protease/amylase  36,000 Units Oral TID AC      LOS: 4 days   Reyes IVAR Moores, MD [facility] Office  [phone] Pager - Text Page per Tracey  If 7PM-7AM, please contact night-coverage per Amion 12/02/2024, 10:27 AM     "

## 2024-12-02 NOTE — Progress Notes (Signed)
 Physical Therapy Treatment Patient Details Name: Jacqueline Tyler MRN: [id number] DOB: May 01, 1961 Today's Date: 12/02/2024   History of Present Illness 64 yo F adm 11/28/24 s/p fall with R femur fx and R wrist fx. Pt s/p 11/29/24 IM rod R LE. PMHx: metastic pancreatic CA, HTN DM2,    PT Comments  Pt resting in bed on arrival, eager for mobility and demonstrating great progress towards acute goals this session. Pt progressing ambulation this session with R PFRW >100' with grossly CGA for safety. Pt continues to require cues for adherence to RUE NWB as pt attempting to push up through hand for bed mobility, ultimately needing min A to elevate trunk and scoot out to place feet on floor. Pt performing x2 transfers sit<>stand during session with CGA for safety. HEP issued and pt performing exercises noted below with cues for technique and pt verbalizing understanding of completion throughout day and between therapies. Plan to progress stair training in next session. Pt continues to benefit from skilled PT services to progress toward functional mobility goals.     If plan is discharge home, recommend the following: A lot of help with walking and/or transfers;A lot of help with bathing/dressing/bathroom;Assistance with cooking/housework;Assist for transportation;Help with stairs or ramp for entrance   Can travel by private vehicle        Equipment Recommendations  Wheelchair (measurements PT);Wheelchair cushion (measurements PT);BSC/3in1;Rolling walker (2 wheels);Other (comment) (RUE platform attachment, may not need W/C pending continued improvement and stiar training)    Recommendations for Other Services       Precautions / Restrictions Precautions Precautions: Fall Recall of Precautions/Restrictions: Intact Precaution/Restrictions Comments: RUE WB through elbow only Required Braces or Orthoses: Splint/Cast Splint/Cast: R wrist cock up Splint/Cast - Date Prophylactic Dressing Applied (if  applicable): 11/28/24 Restrictions Weight Bearing Restrictions Per Provider Order: Yes RUE Weight Bearing Per Provider Order: Weight bear through elbow only RLE Weight Bearing Per Provider Order: Weight bearing as tolerated Other Position/Activity Restrictions: Per Secure Chat with Orthopedics PA-C 1/12 recommended platform attachment     Mobility  Bed Mobility Overal bed mobility: Needs Assistance Bed Mobility: Supine to Sit     Supine to sit: Min assist, HOB elevated, Used rails     General bed mobility comments: min A to sit up to R EOB, max cues not to push through R hand, assit at bed pad to scoot out to EOB    Transfers Overall transfer level: Needs assistance Equipment used: Right platform walker Transfers: Sit to/from Stand, Bed to chair/wheelchair/BSC Sit to Stand: Contact guard assist           General transfer comment: pt standing from EOB at lowest height and BSC with CGA for safety, good hand placement each rise, cues to place RLE anterior when sitting for decreased pain    Ambulation/Gait Ambulation/Gait assistance: Contact guard assist Gait Distance (Feet): 120 Feet Assistive device: Rolling walker (2 wheels) Gait Pattern/deviations: Step-through pattern, Drifts right/left, Trunk flexed, Narrow base of support Gait velocity: decr     General Gait Details: pt ambulating with a slow step through pattern with CGA for safety, cues for wider BOS and upright posture, some L drift in hall with pt needing hands on assist to correct, chair follow for safety as pt fatigues quickly   Stairs             Wheelchair Mobility     Tilt Bed    Modified Rankin (Stroke Patients Only)       Balance Overall  balance assessment: Needs assistance Sitting-balance support: No upper extremity supported, Feet supported Sitting balance-Leahy Scale: Good Sitting balance - Comments: Sat EOB   Standing balance support: Bilateral upper extremity supported, During  functional activity Standing balance-Leahy Scale: Poor Standing balance comment: reliant on RW for support                            Communication Communication Communication: No apparent difficulties  Cognition Arousal: Alert Behavior During Therapy: WFL for tasks assessed/performed   PT - Cognitive impairments: No apparent impairments                       PT - Cognition Comments: some slow processing needing increased time and repeated cues Following commands: Intact      Cueing Cueing Techniques: Verbal cues, Gestural cues, Tactile cues  Exercises Total Joint Exercises Ankle Circles/Pumps: AROM, Both, 10 reps Quad Sets: AROM, Right, 5 reps Heel Slides: AROM, Right, 5 reps Hip ABduction/ADduction: AROM, Right, 5 reps    General Comments General comments (skin integrity, edema, etc.): Husband present and supportive throughout session, surgical hip HEP issused and exercises noted under Total Joint Exercises physically reviewed.      Pertinent Vitals/Pain Pain Assessment Pain Assessment: No/denies pain Pain Intervention(s): Monitored during session, Limited activity within patient's tolerance    Home Living                          Prior Function            PT Goals (current goals can now be found in the care plan section) Acute Rehab PT Goals Patient Stated Goal: Regain independence PT Goal Formulation: With patient/family Time For Goal Achievement: 12/14/24 Progress towards PT goals: Progressing toward goals    Frequency    Min 2X/week      PT Plan      Co-evaluation              AM-PAC PT 6 Clicks Mobility   Outcome Measure  Help needed turning from your back to your side while in a flat bed without using bedrails?: A Little Help needed moving from lying on your back to sitting on the side of a flat bed without using bedrails?: A Little Help needed moving to and from a bed to a chair (including a  wheelchair)?: A Little Help needed standing up from a chair using your arms (e.g., wheelchair or bedside chair)?: A Little Help needed to walk in hospital room?: A Little Help needed climbing 3-5 steps with a railing? : Total 6 Click Score: 16    End of Session Equipment Utilized During Treatment: Gait belt Activity Tolerance: Patient tolerated treatment well Patient left: in chair;with call bell/phone within reach;with chair alarm set;with family/visitor present Nurse Communication: Mobility status PT Visit Diagnosis: Difficulty in walking, not elsewhere classified (R26.2);Pain;Other abnormalities of gait and mobility (R26.89);Unsteadiness on feet (R26.81) Pain - Right/Left: Right Pain - part of body: Hip;Hand     Time: 1000-1031 PT Time Calculation (min) (ACUTE ONLY): 31 min  Charges:    $Gait Training: 8-22 mins $Therapeutic Activity: 8-22 mins PT General Charges $$ ACUTE PT VISIT: 1 Visit                     Luverne Zerkle R. PTA Acute Rehabilitation Services Office: [phone]   Therisa CHRISTELLA Boor 12/02/2024, 10:40 AM

## 2024-12-02 NOTE — Progress Notes (Signed)
 Occupational Therapy Treatment Patient Details Name: Jacqueline Tyler MRN: [id number] DOB: 16-Oct-1961 Today's Date: 12/02/2024   History of present illness 64 yo F adm 11/28/24 s/p fall with R femur fx and R wrist fx. Pt s/p 11/29/24 IM rod R LE. PMHx: metastic pancreatic CA, HTN DM2,   OT comments  Pt doing well, eager to participate and improve, husband present during session and supportive. Pt wanted to try stairs as she mentioned she may go home in the next day or two. Transported Pt to gym in recliner. Pt reports they have crutches at home, instructed Pt and husband on completing stairs with R HHA from husband and using crutch for L hand. Pt able to complete practice stairs 2X, cueing for crutch placement, hand/foot placement. Pt overall CGA/min A for stairs, did well once she got used to using crutch correctly, and going up with good, down with bad leg, etc. Pt able to complete bed mobility mod I, increased time, although did WB through R wrist, educated Pt on precautions, but she c/o no pain in wrist, wearing wrist cockup splint, verbalized understanding. Pt would benefit from continued acute OT, HHOT follow up still recommended. Pt would benefit from R platform RW for return home, denies need for w/c or BSC for return home. Will buy their own shower chair if needed.       If plan is discharge home, recommend the following:  A lot of help with walking and/or transfers;A little help with bathing/dressing/bathroom;Assistance with cooking/housework;Assist for transportation;Help with stairs or ramp for entrance   Equipment Recommendations  Other (comment) (R platform RW only, Pt denies need for wheelchair or BSC. They will get their own shower seat)    Recommendations for Other Services      Precautions / Restrictions Precautions Precautions: Fall Recall of Precautions/Restrictions: Intact Precaution/Restrictions Comments: RUE WB through elbow only Required Braces or Orthoses:  Splint/Cast Splint/Cast: R wrist cock up Splint/Cast - Date Prophylactic Dressing Applied (if applicable): 11/28/24 Restrictions Weight Bearing Restrictions Per Provider Order: Yes RUE Weight Bearing Per Provider Order: Weight bear through elbow only RLE Weight Bearing Per Provider Order: Weight bearing as tolerated Other Position/Activity Restrictions: Per Secure Chat with Orthopedics PA-C 1/12 recommended platform attachment       Mobility Bed Mobility Overal bed mobility: Needs Assistance Bed Mobility: Supine to Sit     Supine to sit: Contact guard     General bed mobility comments: CGA, cueing for not WB through R hand, Pt does WB to push up and slide to EOB, c/o no pain, wearing wrist cockup splint    Transfers Overall transfer level: Needs assistance Equipment used: Right platform walker Transfers: Sit to/from Stand Sit to Stand: Contact guard assist           General transfer comment: CGA with R platform RW     Balance Overall balance assessment: Needs assistance Sitting-balance support: No upper extremity supported, Feet supported Sitting balance-Leahy Scale: Good Sitting balance - Comments: Sat EOB   Standing balance support: Bilateral upper extremity supported, During functional activity Standing balance-Leahy Scale: Poor Standing balance comment: reliant on RW for support                           ADL either performed or assessed with clinical judgement   ADL Overall ADL's : Needs assistance/impaired Eating/Feeding: Independent   Grooming: Set up;Sitting  Toilet Transfer: Contact guard assist;Rolling walker (2 wheels)           Functional mobility during ADLs: Contact guard assist;Rolling walker (2 wheels) General ADL Comments: Pt doing well, Pt is WB through R hand with transfers and bed mobilty, c/o no pain, wearing R wrist cock up.    Extremity/Trunk Assessment Upper Extremity Assessment Upper Extremity  Assessment: RUE deficits/detail RUE Deficits / Details: R distal radius fx, WB through elbow only. Pt does well with grip/FM, supination/pronation, c/o mild pain RUE Sensation: WNL RUE Coordination: WNL            Vision       Perception     Praxis     Communication Communication Communication: No apparent difficulties   Cognition Arousal: Alert Behavior During Therapy: WFL for tasks assessed/performed Cognition: No apparent impairments                               Following commands: Intact        Cueing   Cueing Techniques: Verbal cues, Gestural cues, Tactile cues  Exercises      Shoulder Instructions       General Comments Pt able to complete stairs using crutch in L hand and HHA for R hand.    Pertinent Vitals/ Pain       Pain Assessment Pain Assessment: Faces Faces Pain Scale: Hurts little more Pain Location: RLE with activity Pain Descriptors / Indicators: Aching, Discomfort, Grimacing Pain Intervention(s): Monitored during session  Home Living                                          Prior Functioning/Environment              Frequency  Min 2X/week        Progress Toward Goals  OT Goals(current goals can now be found in the care plan section)  Progress towards OT goals: Progressing toward goals  Acute Rehab OT Goals Patient Stated Goal: to improve strength and independence OT Goal Formulation: With patient/family Time For Goal Achievement: 12/14/24 Potential to Achieve Goals: Good ADL Goals Pt Will Perform Upper Body Dressing: with set-up Pt Will Transfer to Toilet: with contact guard assist;stand pivot transfer;bedside commode Pt Will Perform Toileting - Clothing Manipulation and hygiene: with set-up;sitting/lateral leans;sit to/from stand  Plan      Co-evaluation                 AM-PAC OT 6 Clicks Daily Activity     Outcome Measure   Help from another person eating meals?:  None Help from another person taking care of personal grooming?: A Little Help from another person toileting, which includes using toliet, bedpan, or urinal?: A Little Help from another person bathing (including washing, rinsing, drying)?: A Little Help from another person to put on and taking off regular upper body clothing?: A Little Help from another person to put on and taking off regular lower body clothing?: A Lot 6 Click Score: 18    End of Session Equipment Utilized During Treatment: Gait belt;Rolling walker (2 wheels)  OT Visit Diagnosis: Unsteadiness on feet (R26.81);Other abnormalities of gait and mobility (R26.89);Muscle weakness (generalized) (M62.81);Pain Pain - Right/Left: Right Pain - part of body: Hip   Activity Tolerance Patient tolerated treatment well   Patient Left in chair;with call  bell/phone within reach;with chair alarm set;with family/visitor present   Nurse Communication Mobility status        Time: 1315-1344 OT Time Calculation (min): 29 min  Charges: OT General Charges $OT Visit: 1 Visit OT Treatments $Self Care/Home Management : 8-22 mins $Therapeutic Activity: 8-22 mins  Shenae Bonanno, OTR/L   [name] 12/02/2024, 2:06 PM

## 2024-12-02 NOTE — Plan of Care (Signed)
" °  Problem: Nutritional: Goal: Will attain and maintain optimal nutritional status Outcome: Progressing   Problem: Neurological: Goal: Will regain or maintain usual level of consciousness Outcome: Progressing   Problem: Clinical Measurements: Goal: Ability to maintain clinical measurements within normal limits Outcome: Progressing Goal: Postoperative complications will be avoided or minimized Outcome: Progressing   Problem: Urinary Elimination: Goal: Will remain free from infection Outcome: Progressing   Problem: Activity: Goal: Ability to ambulate and perform ADLs will improve Outcome: Progressing   Problem: Clinical Measurements: Goal: Postoperative complications will be avoided or minimized Outcome: Progressing   "

## 2024-12-03 ENCOUNTER — Encounter: Payer: Self-pay | Admitting: Hematology

## 2024-12-03 ENCOUNTER — Other Ambulatory Visit (HOSPITAL_COMMUNITY): Payer: Self-pay

## 2024-12-03 DIAGNOSIS — S52501A Unspecified fracture of the lower end of right radius, initial encounter for closed fracture: Secondary | ICD-10-CM | POA: Diagnosis not present

## 2024-12-03 LAB — GLUCOSE, CAPILLARY
Glucose-Capillary: 93 mg/dL (ref 70–99)
Glucose-Capillary: 117 mg/dL — ABNORMAL HIGH (ref 70–99)

## 2024-12-03 MED ORDER — CITALOPRAM HYDROBROMIDE 40 MG PO TABS: 20.0000 mg | ORAL_TABLET | Freq: Every evening | ORAL | Status: AC

## 2024-12-03 MED ORDER — HEPARIN SOD (PORK) LOCK FLUSH 100 UNIT/ML IV SOLN
500.0000 [IU] | Freq: Once | INTRAVENOUS | Status: AC
  Administered 2024-12-03: 500 [IU] via INTRAVENOUS
  Filled 2024-12-03: qty 5

## 2024-12-03 MED ORDER — OXYCODONE HCL 5 MG PO TABS
5.0000 mg | ORAL_TABLET | Freq: Four times a day (QID) | ORAL | 0 refills | Status: AC | PRN
  Filled 2024-12-03 (×2): qty 20, 5d supply, fill #0

## 2024-12-03 MED ORDER — CITALOPRAM HYDROBROMIDE 20 MG PO TABS: 20.0000 mg | ORAL_TABLET | Freq: Every evening | ORAL | Status: DC

## 2024-12-03 MED ORDER — APIXABAN 2.5 MG PO TABS
2.5000 mg | ORAL_TABLET | Freq: Two times a day (BID) | ORAL | 0 refills | Status: AC
  Filled 2024-12-03 (×2): qty 40, 20d supply, fill #0

## 2024-12-03 MED ORDER — POTASSIUM CHLORIDE CRYS ER 20 MEQ PO TBCR: 20.0000 meq | EXTENDED_RELEASE_TABLET | Freq: Every day | ORAL | Status: AC

## 2024-12-03 NOTE — Discharge Summary (Signed)
 "  DISCHARGE SUMMARY  Jacqueline Tyler  MR#: [id number]  DOB:1961-08-30  Date of Admission: 11/28/2024 Date of Discharge: 12/03/2024  Attending Physician:Alazay Leicht Jacqueline Moores, MD  Patient's ERE:Zczmob, Jacqueline NOVAK, DO  Disposition: D/C home   Follow-up Appts:  Contact information for follow-up providers     Jacqueline Soulier, MD. Schedule an appointment as soon as possible for a visit on 12/16/2024.   Specialty: Orthopedic Surgery Contact information: [address] [place] [address] [phone]         Jacqueline Jacqueline B, DO Follow up in 1 week(s).   Specialty: Internal Medicine Contact information: [address] [place] [address] [phone]              Contact information for after-discharge care     Home Medical Care     Jacqueline Tyler) .   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  Evaluation in the ER revealed an acute right femur fracture and a mildly impacted fracture of the distal right radial metaphysis.  Tyler Course:  Right femur fracture due to mechanical fall Status post fixation with intramedullary rod 11/29/2024 - postoperative care per Orthopedic Surgery as follows: Weight Bearing as Tolerated  (WBAT) RLE VTE prophylaxis: Eliquis  2.5mg  BID Patient to follow up in office in 2 weeks for xrays and staple removal.    Acute on chronic symptomatic anemia -acute blood loss anemia due to femur fracture Hemoglobin reached nadir of 6.7 on 1/12 requiring 1 unit PRBC - hemoglobin stable at time of discharge   Right wrist fracture Mildly impacted fracture of the distal right radial metaphysis - nonoperative management per Orthopedic Surgery   Metastatic pancreatic cancer Followed by Dr. 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  See Progress Note 01/09/24.   Tyloxapol Other (See Comments)   Nausea Tylox   Oxycodone -acetaminophen  Nausea Only   Patient was unsure / doesn't recall taking    Poison Ivy Extract Hives   Patient states HIGHLY allergice        Medication List     STOP taking these medications    potassium chloride  10 MEQ tablet Commonly known as: KLOR-CON        TAKE these medications    acetaminophen  325 MG tablet Commonly known as: TYLENOL  Take 650  mg by mouth every 6 (six) hours as needed for moderate pain (pain score 4-6).   apixaban  2.5 MG Tabs tablet Commonly known as: ELIQUIS  Take 1 tablet (2.5 mg total) by mouth 2 (two) times daily.   busPIRone  5 MG tablet Commonly known as: BUSPAR  Take 5 mg by mouth 2 (two) times daily.   citalopram  40 MG tablet Commonly known as: CELEXA  Take 0.5 tablets (20 mg total) by mouth every evening. What changed: how much to take   Creon   36000-114000 units Cpep capsule Generic drug: lipase/protease/amylase TAKE 2 CAPSULES BY MOUTH 3 TIMES A DAY WITH A MEAL. MAY ALSO TAKE 1 CAPSULE AS NEEDED WITH SNACKS   hydrochlorothiazide  12.5 MG tablet Commonly known as: HYDRODIURIL  TAKE 1/2 TABLET BY MOUTH EVERY DAY   ondansetron  8 MG tablet Commonly known as: ZOFRAN  Take 1 tablet (8 mg total) by mouth every 8 (eight) hours as needed for nausea or vomiting.   oxyCODONE  5 MG immediate release tablet Commonly known as: Oxy IR/ROXICODONE  Take 1 tablet (5 mg total) by mouth every 6 (six) hours as needed for severe pain (pain score 7-10).   pantoprazole  40 MG tablet Commonly known as: Protonix  Take 1 tablet (40 mg total) by mouth 2 (two) times daily before a meal. Twice daily for 2 months then once daily thereafter.   potassium chloride  SA 20 MEQ tablet Commonly known as: Klor-Con  M20 Take 1 tablet (20 mEq total) by mouth daily. What changed: when to take this   prochlorperazine  10 MG tablet Commonly known as: COMPAZINE  Take 1 tablet (10 mg total) by mouth every 6 (six) hours as needed for nausea or vomiting.   simvastatin 80 MG tablet Commonly known as: ZOCOR Take 80 mg by mouth every evening.   Vitamin D (Ergocalciferol) 1.25 MG (50000 UNIT) Caps capsule Commonly known as: DRISDOL Take 50,000 Units by mouth every 7 (seven) days.               Durable Medical Equipment  (From admission, onward)           Start     Ordered   12/02/24 1614  For home use only DME Walker rolling  Once       Comments: With R arm platform  Question Answer Comment  Walker: With 5 Inch Wheels   Patient needs a walker to treat with the following condition Fx      12/02/24 1614              Discharge Care Instructions  (From admission, onward)           Start     Ordered   12/01/24 0000  Weight bearing as tolerated       Question Answer Comment  Laterality right   Extremity Lower      12/01/24 0849             Day of Discharge BP 136/66 (BP Location: Left Arm)   Pulse 70   Temp 98.4 F (36.9 C) (Oral)   Resp 16   Ht 5' 1 (1.549 m)   Wt 50.7 kg   SpO2 97%   BMI 21.12 kg/m   Physical Exam: General: No acute respiratory distress Lungs: Clear to auscultation bilaterally without wheezes or crackles Cardiovascular: Regular rate and rhythm without murmur gallop or rub normal S1 and S2 Abdomen: Nontender, nondistended, soft, bowel sounds positive, no rebound, no ascites, no appreciable mass Extremities: No significant cyanosis, clubbing, or edema bilateral lower extremities  Basic  Metabolic Panel: Recent Labs  Lab 11/28/24 1828 11/29/24 1124 11/30/24 0537  NA 134* 131* 133*  K 3.7 4.4 4.4  CL 99 97* 98  CO2 25 24 28   GLUCOSE 122* 139* 168*  BUN 14 15 15   CREATININE 0.84 0.88 0.87  CALCIUM  8.5* 8.3* 8.2*    CBC: Recent Labs  Lab 11/28/24 1828 11/29/24 1124 11/30/24 0537 11/30/24 0828 11/30/24 2019  WBC 13.8* 10.7* 6.9  --   --   NEUTROABS 13.5*  --   --   --   --   HGB 9.0* 8.3* 6.7* 7.0* 8.3*  HCT 27.4* 25.1* 21.1* 21.6* 25.8*  MCV 100.7* 100.8* 101.9*  --   --   PLT 284 260 263  --   --       Time spent in discharge (includes decision making & examination of pt): 35 minutes  12/03/2024, 11:26 AM   Reyes IVAR Moores, MD [facility] Office  [phone]      "

## 2024-12-03 NOTE — Progress Notes (Signed)
 Physical Therapy Treatment Patient Details Name: Jacqueline Tyler MRN: [id number] DOB: October 30, 1961 Today's Date: 12/03/2024   History of Present Illness 64 yo F adm 11/28/24 s/p fall with R femur fx and R wrist fx. Pt s/p 11/29/24 IM rod R LE. PMHx: metastic pancreatic CA, HTN, and DM2.    PT Comments  Today's session focused on stair training using right platform RW. Educated pt on proper technique in accordance with her home set-up. Demonstrated backward/forward step-to pattern. Instructed her to ascend with LLE and descend with RLE. She required CGA for trunk stability and minA to help lift/lower AD. Pt completed 2 standard height steps three times. Cues for sequencing throughout. Provided pt with handout for stairs using walker which provided pictures and step by step instructions, which she could use to help educated her family. Discussed how her family should be positioned to support her. Pt is making steady progress towards her acute PT goals demonstrated by advanced functional mobility with decreased physical assistance. She increased gait distance ambulating ~278ft within the hallway using right platform RW. Pt feels ready and safe for d/c Home with HHPT. I have answered all her questions related to mobility.      If plan is discharge home, recommend the following: A little help with walking and/or transfers;A little help with bathing/dressing/bathroom;Assistance with cooking/housework;Assist for transportation;Help with stairs or ramp for entrance   Can travel by private vehicle        Equipment Recommendations  Rolling walker (2 wheels) (RUE platform attachment)    Recommendations for Other Services       Precautions / Restrictions Precautions Precautions: Fall Recall of Precautions/Restrictions: Intact Precaution/Restrictions Comments: Pt required reminders to adhere to RUE weight bearing restrictions. Required Braces or Orthoses: Splint/Cast Splint/Cast: R wrist cock up Splint/Cast  - Date Prophylactic Dressing Applied (if applicable): 11/28/24 Restrictions Weight Bearing Restrictions Per Provider Order: Yes RUE Weight Bearing Per Provider Order: Weight bear through elbow only RLE Weight Bearing Per Provider Order: Weight bearing as tolerated Other Position/Activity Restrictions: Per Secure Chat with Orthopedics PA-C 1/12 recommended platform attachment     Mobility  Bed Mobility               General bed mobility comments: Not assessed. Pt greeted in the bathroom and left in recliner chair at end of session.    Transfers Overall transfer level: Needs assistance Equipment used: Right platform walker Transfers: Sit to/from Stand, Bed to chair/wheelchair/BSC Sit to Stand: Supervision   Step pivot transfers: Supervision       General transfer comment: Pt stood from commode and recliner chair. Cued proper hand placement using RW. She intermittently attempted to push up with RUE. Re-educated on precautions emphasizing weight only through the elbow. No physical assist to stand. Supervision for safety. Transferred to recliner chair. Good eccentric control.    Ambulation/Gait Ambulation/Gait assistance: Supervision Gait Distance (Feet): 250 Feet Assistive device: Right platform walker Gait Pattern/deviations: Step-through pattern, Drifts right/left, Narrow base of support, Decreased stride length Gait velocity: decr Gait velocity interpretation: <1.8 ft/sec, indicate of risk for recurrent falls   General Gait Details: Pt ambulated with short slow steps. She demonstrated even weigth shift and good foot clearence. Adjusted platform height which improve pt's upright posture, removing fwd lean and increased support at forearm. Pt maintains a NBOS, cued shoulder width apart. Pt navigated bathroom, room, and hallway well, no LOB.   Stairs Stairs: Yes Stairs assistance: Contact guard assist, Min assist Stair Management: Backwards, Forwards, Step to pattern,  With  walker Number of Stairs: 2 (x3) General stair comments: Educated pt on stair training in accordance to her home set-up. Instructed her to ascend with LLE and descend with RLE one step at a time. Demonstrated use of RPFRW on stairs going backwards/forwards with AD positioned diagonlly with all four points in contact with the ground. Educated pt on how her family should be positioned to assist her and provided her with stairs with walker handout. She completed 2 standard height steps three times. Cues for sequencing. CGA for safety/stability. MinA to aid in lifting and repositioning AD. PT provided support along crossbar to prevent moving. Pt reported increased confidence with repeated practice.   Wheelchair Mobility     Tilt Bed    Modified Rankin (Stroke Patients Only)       Balance Overall balance assessment: Needs assistance Sitting-balance support: No upper extremity supported, Feet supported Sitting balance-Leahy Scale: Good Sitting balance - Comments: Sat on commode and recliner   Standing balance support: Bilateral upper extremity supported, During functional activity, Reliant on assistive device for balance Standing balance-Leahy Scale: Poor Standing balance comment: Pt dependent on RPFRW. She required assist for pericare after toileting. Pt stood at sink to wash her hands.                            Communication Communication Communication: No apparent difficulties  Cognition Arousal: Alert Behavior During Therapy: WFL for tasks assessed/performed   PT - Cognitive impairments: No apparent impairments                       PT - Cognition Comments: WFL; noted some slow processing needing increased time and repeated cues Following commands: Intact      Cueing Cueing Techniques: Verbal cues, Gestural cues, Visual cues  Exercises      General Comments        Pertinent Vitals/Pain Pain Assessment Pain Assessment: No/denies pain    Home Living                           Prior Function            PT Goals (current goals can now be found in the care plan section) Acute Rehab PT Goals Patient Stated Goal: Stay motivated on my recovery. PT Goal Formulation: With patient/family Time For Goal Achievement: 12/14/24 Potential to Achieve Goals: Good Progress towards PT goals: Progressing toward goals    Frequency    Min 2X/week      PT Plan      Co-evaluation              AM-PAC PT 6 Clicks Mobility   Outcome Measure  Help needed turning from your back to your side while in a flat bed without using bedrails?: A Little Help needed moving from lying on your back to sitting on the side of a flat bed without using bedrails?: A Little Help needed moving to and from a bed to a chair (including a wheelchair)?: A Little Help needed standing up from a chair using your arms (e.g., wheelchair or bedside chair)?: A Little Help needed to walk in hospital room?: A Little Help needed climbing 3-5 steps with a railing? : A Little 6 Click Score: 18    End of Session Equipment Utilized During Treatment: Gait belt Activity Tolerance: Patient tolerated treatment well Patient left: in chair;with call bell/phone within  reach;with chair alarm set;Other (comment) (breakfast tray set-up infront of her) Nurse Communication: Mobility status PT Visit Diagnosis: Difficulty in walking, not elsewhere classified (R26.2);Pain;Other abnormalities of gait and mobility (R26.89);Unsteadiness on feet (R26.81) Pain - Right/Left: Right Pain - part of body: Hip;Hand     Time: 9193-9166 PT Time Calculation (min) (ACUTE ONLY): 27 min  Charges:    $Gait Training: 23-37 mins PT General Charges $$ ACUTE PT VISIT: 1 Visit                     Randall SAUNDERS, PT, DPT Acute Rehabilitation Services Office: [phone] Secure Chat Preferred  Jacqueline Tyler 12/03/2024, 8:47 AM

## 2024-12-03 NOTE — Plan of Care (Signed)
  Problem: Coping: Goal: Ability to adjust to condition or change in health will improve Outcome: Progressing   Problem: Skin Integrity: Goal: Risk for impaired skin integrity will decrease Outcome: Progressing   Problem: Tissue Perfusion: Goal: Adequacy of tissue perfusion will improve Outcome: Progressing   Problem: Nutrition: Goal: Adequate nutrition will be maintained Outcome: Progressing

## 2024-12-03 NOTE — Plan of Care (Signed)

## 2024-12-03 NOTE — TOC Transition Note (Signed)
 Transition of Care [facility]) - Discharge Note   Patient Details  Name: Jacqueline Tyler MRN: [id number] Date of Birth: 03-28-1961  [facility]) CM/SW Contact:  Rosalva Jon Bloch, RN Phone Number: 12/03/2024, 11:31 AM   Clinical Narrative:    Patient will DC un:ynfz Anticipated DC date: 12/03/2024 Family notified: yes Transport by: car   Per MD patient ready for DC today. RN, patient, and patient's husband notified of DC. Pt agreeable to home health services. Pt without provider preference. Referral made with [facility] and accepted.  Referral made [employer] for DME:RW with R arm platform. Equipment will be delivered to bedside prior to discharge. Pt without RX med concerns or transportation issues. Post hospital f/u noted on AVS.  RNCM will sign off for now as intervention is no longer needed. Please consult us  again if new needs arise.    Final next level of care: Home w Home Health Services Barriers to Discharge: No Barriers Identified   Patient Goals and CMS Choice     Choice offered to / list presented to : Patient      Discharge Placement                       Discharge Plan and Services Additional resources added to the After Visit Summary for                  DME Arranged: Walker platform, Walker rolling DME Agency: [employer] Date DME Agency Contacted: 12/03/24 Time DME Agency Contacted: 1130 Representative spoke with at DME Agency: [facility] HH Arranged: PT, OT HH Agency: [facility] Date [facility] Agency Contacted: 12/03/24 Time HH Agency Contacted: 1131 Representative spoke with at [facility] Agency: Darle[facility]  Social Drivers of Health (SDOH) Interventions SDOH Screenings   Food Insecurity: No Food Insecurity (11/28/2024)  Housing: Low Risk (11/28/2024)  Transportation Needs: No Transportation Needs (11/28/2024)  Utilities: Not At Risk (11/28/2024)  Depression (PHQ2-9): Low Risk (11/26/2024)  Tobacco Use: Low Risk (11/29/2024)      Readmission Risk Interventions     No data to display

## 2024-12-03 NOTE — Progress Notes (Signed)
 Discharge Nurse Summary: DC order noted per MD. DC RN at bedside with patient/family. Patient agreeable with discharge plan. AVS printed/reviewed. Port deaccess complete by IV team, skin intact. RW delivered to bedside. No home meds. TOC meds delivered to the patient. CP/Edu resolved. Telemonitor not present on assessment. All belongings accounted for. Patient getting dressed pending transport for discharge via private auto.  Rosario EMERSON Lund, RN

## 2024-12-03 NOTE — Plan of Care (Signed)
 " Problem: Education: Goal: Ability to describe self-care measures that may prevent or decrease complications (Diabetes Survival Skills Education) will improve Outcome: Adequate for Discharge Goal: Individualized Educational Video(s) Outcome: Adequate for Discharge   Problem: Coping: Goal: Ability to adjust to condition or change in health will improve 12/03/2024 1215 by Delores Kirsch, RN Outcome: Adequate for Discharge 12/03/2024 1215 by Delores Kirsch, RN Outcome: Progressing   Problem: Fluid Volume: Goal: Ability to maintain a balanced intake and output will improve Outcome: Adequate for Discharge   Problem: Health Behavior/Discharge Planning: Goal: Ability to identify and utilize available resources and services will improve Outcome: Adequate for Discharge Goal: Ability to manage health-related needs will improve Outcome: Adequate for Discharge   Problem: Metabolic: Goal: Ability to maintain appropriate glucose levels will improve Outcome: Adequate for Discharge   Problem: Nutritional: Goal: Maintenance of adequate nutrition will improve Outcome: Adequate for Discharge Goal: Progress toward achieving an optimal weight will improve Outcome: Adequate for Discharge   Problem: Skin Integrity: Goal: Risk for impaired skin integrity will decrease 12/03/2024 1215 by Delores Kirsch, RN Outcome: Adequate for Discharge 12/03/2024 1215 by Delores Kirsch, RN Outcome: Progressing   Problem: Tissue Perfusion: Goal: Adequacy of tissue perfusion will improve 12/03/2024 1215 by Delores Kirsch, RN Outcome: Adequate for Discharge 12/03/2024 1215 by Delores Kirsch, RN Outcome: Progressing   Problem: Education: Goal: Knowledge of General Education information will improve Description: Including pain rating scale, medication(s)/side effects and non-pharmacologic comfort measures Outcome: Adequate for Discharge   Problem: Health Behavior/Discharge Planning: Goal: Ability to manage  health-related needs will improve Outcome: Adequate for Discharge   Problem: Clinical Measurements: Goal: Ability to maintain clinical measurements within normal limits will improve Outcome: Adequate for Discharge Goal: Will remain free from infection Outcome: Adequate for Discharge Goal: Diagnostic test results will improve Outcome: Adequate for Discharge Goal: Respiratory complications will improve Outcome: Adequate for Discharge Goal: Cardiovascular complication will be avoided Outcome: Adequate for Discharge   Problem: Activity: Goal: Risk for activity intolerance will decrease Outcome: Adequate for Discharge   Problem: Nutrition: Goal: Adequate nutrition will be maintained 12/03/2024 1215 by Delores Kirsch, RN Outcome: Adequate for Discharge 12/03/2024 1215 by Delores Kirsch, RN Outcome: Progressing   Problem: Coping: Goal: Level of anxiety will decrease Outcome: Adequate for Discharge   Problem: Elimination: Goal: Will not experience complications related to bowel motility Outcome: Adequate for Discharge Goal: Will not experience complications related to urinary retention Outcome: Adequate for Discharge   Problem: Pain Managment: Goal: General experience of comfort will improve and/or be controlled Outcome: Adequate for Discharge   Problem: Safety: Goal: Ability to remain free from injury will improve Outcome: Adequate for Discharge   Problem: Skin Integrity: Goal: Risk for impaired skin integrity will decrease Outcome: Adequate for Discharge   Problem: Education: Goal: Knowledge of the prescribed therapeutic regimen will improve Outcome: Adequate for Discharge   Problem: Bowel/Gastric: Goal: Gastrointestinal status for postoperative course will improve Outcome: Adequate for Discharge   Problem: Cardiac: Goal: Ability to maintain an adequate cardiac output Outcome: Adequate for Discharge Goal: Will show no evidence of cardiac arrhythmias Outcome:  Adequate for Discharge   Problem: Nutritional: Goal: Will attain and maintain optimal nutritional status Outcome: Adequate for Discharge   Problem: Neurological: Goal: Will regain or maintain usual level of consciousness Outcome: Adequate for Discharge   Problem: Clinical Measurements: Goal: Ability to maintain clinical measurements within normal limits Outcome: Adequate for Discharge Goal: Postoperative complications will be avoided or minimized Outcome: Adequate for Discharge   Problem:  Respiratory: Goal: Will regain and/or maintain adequate ventilation Outcome: Adequate for Discharge Goal: Respiratory status will improve Outcome: Adequate for Discharge   Problem: Skin Integrity: Goal: Demonstrates signs of wound healing without infection Outcome: Adequate for Discharge   Problem: Urinary Elimination: Goal: Will remain free from infection Outcome: Adequate for Discharge Goal: Ability to achieve and maintain adequate urine output Outcome: Adequate for Discharge   Problem: Education: Goal: Verbalization of understanding the information provided (i.e., activity precautions, restrictions, etc) will improve Outcome: Adequate for Discharge Goal: Individualized Educational Video(s) Outcome: Adequate for Discharge   Problem: Activity: Goal: Ability to ambulate and perform ADLs will improve Outcome: Adequate for Discharge   Problem: Clinical Measurements: Goal: Postoperative complications will be avoided or minimized Outcome: Adequate for Discharge   Problem: Self-Concept: Goal: Ability to maintain and perform role responsibilities to the fullest extent possible will improve Outcome: Adequate for Discharge   "

## 2024-12-08 ENCOUNTER — Other Ambulatory Visit: Payer: Self-pay | Admitting: Hematology

## 2024-12-08 ENCOUNTER — Other Ambulatory Visit: Payer: Self-pay

## 2024-12-08 ENCOUNTER — Telehealth: Payer: Self-pay

## 2024-12-08 NOTE — Telephone Encounter (Signed)
 Pt called stating she fell at home a few weeks ago and fractured her hip.  Pt stated she's scheduled to come in this Friday for possible blood transfusion.  Pt wants to know does she still need to come in on Friday for the possible blood transfusion.  Stated this nurse will make Dr Lanny aware.

## 2024-12-09 ENCOUNTER — Other Ambulatory Visit: Payer: Self-pay

## 2024-12-09 DIAGNOSIS — D649 Anemia, unspecified: Secondary | ICD-10-CM

## 2024-12-09 DIAGNOSIS — C250 Malignant neoplasm of head of pancreas: Secondary | ICD-10-CM

## 2024-12-09 NOTE — Telephone Encounter (Signed)
 Spoke with Jacqueline Tyler via telephone to inform Jacqueline Tyler that Dr Lanny would like for the Jacqueline Tyler to keep her appts on Friday d/t Jacqueline Tyler required blood products while in the hospital.  Jacqueline Tyler was hesitant but agreed to come in on Friday if weather permits.

## 2024-12-10 ENCOUNTER — Other Ambulatory Visit: Payer: Self-pay | Admitting: Nurse Practitioner

## 2024-12-10 DIAGNOSIS — D649 Anemia, unspecified: Secondary | ICD-10-CM

## 2024-12-10 NOTE — Progress Notes (Signed)
 " Patient Care Team: [name], DO as PCP - General (Internal Medicine) Lanny Callander, MD as Consulting Physician (Oncology) Dasie Leonor CROME, MD as Consulting Physician (General Surgery)  Clinic Day:  12/11/2024  Referring physician: Lanny Callander, MD  ASSESSMENT & PLAN:   Assessment & Plan: Pancreatic cancer [facility]) stage IIB, [name], [name], [name] -Diagnosed 08/24/20 on EUS with Dr Burnette, which showed mass at head of pancreas, s/p stenting, with SMV and PV invasion, biopsy confirmed adenocarcinoma with few malignant-appearing LNs in peripancreatic and porta hepatis region. Staging was negative for metastatic disease.  -she completed neoadjuvant chemo FOLFIRINOX q2 weeks 09/12/20 - 12/29/20. -s/p whipple surgery by Dr Debora on 02/08/21, path showed no residual disease.  -due to the high recurrence risk of pancreatic cancer, she is under close surveillance.  -Her PET scan from September 30, 2023 showed hypermetabolic soft tissue mass at the previous Whipple surgical site, and a small mildly hypermetabolic nodule in the left lung, concerning for metastatic recurrence. -lung biopsy 10/28/2023 confirmed adenocarcinoma, IHC consistent with metastatic pancreatic ca.  -she started chemo NALIRIFOX  on 11/14/23, had a significant diarrhea, required dose reduction. S/p 6 cycles  -restaging CT 02/03/2024 showed good partial response  -she started consolidation SBRT to lung and pancrease on 03/16/2024 and completed on 03/26/2024 -CT 05/21/2024 showed  treatment response in pancreas and lung nodule, but also showed scattered new left upper lobe pulmonary nodules measure up to 6 mm concerning for progressive pulmonary metastatic disease.  Her tumor marker CA 19.9 dropped down to normal previously and is back to 300's now, concerning for disease progression.  -she was asymptomatic and reluctant to have chemo again, so we decided to wait for 2-3 months. -repeated CT  07/29/24 showed disease progression in lung and new peritoneal metastasis - it was recommended she restart chemotherapy with gemcitabine  and Abraxane  every 2 weeks, due to her previous poor tolerance to chemo. She started on 08/21/2024 -CT 11/04/2024 showed cancer progression in the peritoneum --CA 19 9 level has continued to improve despite minimal cancer progression in the peritoneum.  For now, we will continue chemotherapy gemcitabine  and Abraxane  every 2 weeks.  Repeat CT CAP in mid-to-late February 2026.  If progression continues, will change chemotherapy to FOLFOX or 5-FU and liposomal irinotecan  - 12/11/2024 -holding chemotherapy today due to recent surgery to repair acute hip fracture after a fall at home.  She was transfused 1 unit PRBCs during hospitalization.  Anemia has improved to baseline as of today's visit.  CA 19-9 level starting to climb.  For now, plan to resume chemotherapy with gemcitabine  and Abraxane  in 2 weeks.   Hip fracture Patient hospitalized from 11/28/2024 through 12/03/2024 after experiencing a fall at home.  She states that she tripped over her dog and fell on top of floor.  She fractured her right hip which did require surgical repair during hospitalization.  She has a hairline fracture to her right wrist also.  It is in a splint.  She will follow-up with orthopedic surgeon next week to have surgical staples removed.  Anemia Likely due to acute blood loss from surgery.  She did require 1 unit of PRBCs during hospitalization.  Recheck of blood count today is much improved.  Hgb 10.6 and HCT 32.2.  Her iron panel is normal, and ferritin is slightly elevated at 497.  Will continue to monitor and treat iron deficiency with blood transfusion or iron infusion as indicated.  Pancreatic cancer Patient  is currently being treated with chemotherapy gemcitabine  and Abraxane .  Due to recent surgery, anemia, and hospitalization, treatment is being deferred today to give her body the  opportunity to heal.  Her CA 19-9 level is elevated today.  It is 1,841, up from 1,414.  For now, plan to resume chemotherapy treatment with gemcitabine  and Abraxane  during this, possibly sooner.  Due for repeat CT CAP in February 2026.  Plan Labs reviewed. -Unremarkable CMP. - Improved CBC. - Elevated ferritin and unremarkable iron panel. - Rising CA 19-9 with level of 1,841. Holding chemotherapy treatment today due to recent surgery, anemia, and hospitalization. Resume treatment in 1 to 2 weeks with chemotherapy gemcitabine  and Abraxane . Repeat CT CAP in February 2026.  The patient understands the plans discussed today and is in agreement with them.  She knows to contact our office if she develops concerns prior to her next appointment.  I provided 25 minutes of face-to-face time during this encounter and > 50% was spent counseling as documented under my assessment and plan.    Powell FORBES Lessen, NP   [facility] - A DEPT OF [facility] [address] [place] [address] Dept: [phone] Dept Fax: [phone]   No orders of the defined types were placed in this encounter.     CHIEF COMPLAINT:  CC: Pancreatic cancer  Current Treatment: Chemotherapy gemcitabine  and Abraxane   INTERVAL HISTORY:  Jacqueline Tyler is here today for repeat clinical assessment.  She last saw me on 11/26/2024.  Will have repeat CT CAP in February 2026.  She was hospitalized from 11/28/2024 through 12/03/2024 due to fall at home and hip fracture.  She was anemic due to surgery and did have a blood transfusion in the hospital.  Due to recent surgery, chemotherapy treatment is on hold.  She reports improved pain in her hip and wrist.  Feels tired and weak.  Repeat labs show improved anemia with unremarkable ferritin and iron panel.  She denies chest pain, chest pressure, or shortness of breath. She denies headaches or visual disturbances. She denies  abdominal pain, nausea, vomiting, or changes in bowel or bladder habits.  She denies fevers or chills. Her appetite is good. Her weight has increased 2 pounds over last 2 weeks.  I have reviewed the past medical history, past surgical history, social history and family history with the patient and they are unchanged from previous note.  ALLERGIES:  is allergic to irinotecan  liposome, tyloxapol, oxycodone -acetaminophen , and poison ivy extract.  MEDICATIONS:  Current Outpatient Medications  Medication Sig Dispense Refill   acetaminophen  (TYLENOL ) 325 MG tablet Take 650 mg by mouth every 6 (six) hours as needed for moderate pain (pain score 4-6).     apixaban  (ELIQUIS ) 2.5 MG TABS tablet Take 1 tablet (2.5 mg total) by mouth 2 (two) times daily. 40 tablet 0   busPIRone  (BUSPAR ) 5 MG tablet Take 5 mg by mouth 2 (two) times daily.     citalopram  (CELEXA ) 40 MG tablet Take 0.5 tablets (20 mg total) by mouth every evening.     CREON  36000-114000 units CPEP capsule TAKE 2 CAPSULES BY MOUTH 3 TIMES A DAY WITH A MEAL. MAY ALSO TAKE 1 CAPSULE AS NEEDED WITH SNACKS 240 capsule 2   hydrochlorothiazide  (HYDRODIURIL ) 12.5 MG tablet TAKE 1/2 TABLET BY MOUTH EVERY DAY 45 tablet 1   ondansetron  (ZOFRAN ) 8 MG tablet Take 1 tablet (8 mg total) by mouth every 8 (eight) hours as  needed for nausea or vomiting. 30 tablet 1   oxyCODONE  (OXY IR/ROXICODONE ) 5 MG immediate release tablet Take 1 tablet (5 mg total) by mouth every 6 (six) hours as needed for severe pain (pain score 7-10). 20 tablet 0   pantoprazole  (PROTONIX ) 40 MG tablet Take 1 tablet (40 mg total) by mouth 2 (two) times daily before a meal. Twice daily for 2 months then once daily thereafter. 60 tablet 11   potassium chloride  SA (KLOR-CON  M20) 20 MEQ tablet Take 1 tablet (20 mEq total) by mouth daily.     prochlorperazine  (COMPAZINE ) 10 MG tablet Take 1 tablet (10 mg total) by mouth every 6 (six) hours as needed for nausea or vomiting. 30 tablet 1    simvastatin (ZOCOR) 80 MG tablet Take 80 mg by mouth every evening.     Vitamin D, Ergocalciferol, (DRISDOL) 1.25 MG (50000 UNIT) CAPS capsule Take 50,000 Units by mouth every 7 (seven) days.     No current facility-administered medications for this visit.    HISTORY OF PRESENT ILLNESS:   Oncology History Overview Note  Cancer Staging Pancreatic cancer [facility]) Staging form: Exocrine Pancreas, AJCC 8th Edition - Clinical stage from 08/24/2020: Stage IIB (cT2, cN1, cM0) - Signed by Lanny Callander, MD on 08/30/2020 Stage prefix: Initial diagnosis    Pancreatic cancer (HCC)  08/20/2020 Imaging   CT AP 08/20/20  IMPRESSION: 1. 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 Collateral circulation is noted. There is moderate intrahepatic and extrahepatic biliary dilatation which appears to be due to the pancreatic head mass. Pancreatic ductal dilatation is noted as well. 2. Probable 2.7 cm uterine fibroid. 3. Aortic atherosclerosis.   Aortic Atherosclerosis (ICD10-I70.0).   08/20/2020 Tumor Marker   Ca19-9 - 927   08/22/2020 Procedure   ERCP by Dr Rosalie 08/22/20  IMPRESSION - The major papilla appeared normal. - A biliary sphincterotomy was performed. - Cells for cytology obtained in the lower third and middle of the main duct. - One plastic stent was placed into the common bile duct.   FINAL MICROSCOPIC DIAGNOSIS:  - No malignant cells identified  - Benign reactive/reparative changes   08/24/2020 Cancer Staging   Staging form: Exocrine Pancreas, AJCC 8th Edition - Clinical stage from 08/24/2020: Stage IIB (cT2, cN1, cM0) - Signed by Lanny Callander, MD on 08/30/2020   08/24/2020 Procedure   EUS by Dr burnette 08/24/20  IMPRESSION - There was no sign of significant pathology in the ampulla. - A few malignant-appearing  lymph nodes were visualized in the peripancreatic region and porta hepatis region. - One stent was visualized endosonographically in the common bile duct. - A mass was identified in the pancreatic head. Tissue was obtained from this exam. The preliminary diagnosis is consistent with adenocarcinoma. Invasion into SMV/PV seen. Lymphadenopathy noted. This was staged T3 N1 Mx by endosonographic criteria. Fine needle aspiration performed.   08/24/2020 Initial Biopsy   FINAL MICROSCOPIC DIAGNOSIS: 08/24/20 - Malignant cells consistent with adenocarcinoma    08/30/2020 Initial Diagnosis   Pancreatic cancer (HCC)   09/05/2020 Imaging   CT Chest  IMPRESSION: 1. Stable 2.7 cm infiltrating pancreatic head mass with borderline enlarged peripancreatic lymph nodes. 2. No findings for pulmonary metastatic disease. 3. Small hiatal hernia. 4. Aortic atherosclerosis.   Aortic Atherosclerosis (ICD10-I70.0).   09/08/2020 Procedure  INSERTION PORT-A-CATH by Dr Dasie and Aron    09/12/2020 - 12/29/2020 Chemotherapy   Neoadjuvant FOLFIRINOX q2 weeks starting 09/12/20-12/29/20    12/02/2020 Imaging   CT CAP  IMPRESSION: 1. Previously noted mass of the central pancreatic head is almost entirely resolved, difficult to discretely appreciate on current examination. Findings are consistent with treatment response. There remains obstruction of the pancreatic duct near the head neck junction with mild prominence of the pancreatic duct, measuring up to 5 mm, with atrophy of the distal pancreatic parenchyma. 2. The portal vein, splenic vein, and superior mesenteric vein are now widely patent, previously effaced at the confluence by mass effect. 3. Interval placement of common bile duct stent, tip positioned in the distal duodenum, with relief of previously seen biliary ductal dilatation. 4. 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 Following the bifurcation of the celiac mesenteric trunk, the celiac axis appears to directly traverse the vicinity of the mass, although there does appear to be a fat plane about the vessel. 5. The distal small bowel and colon are diffusely somewhat hyperenhancing and inflamed appearing with vascular combing and a tethered appearance of the distal small bowel. This appearance generally suggests inflammatory bowel disease such as Crohn's disease. Correlate with referable clinical history, if present. No evidence of obstruction or other acute complication. 6. Hepatic steatosis. 7. Aortic atherosclerosis.   12/28/2020 Genetic Testing   Negative hereditary cancer genetic testing: no pathogenic variants detected in [facility] Common Hereditary Cancers Panel.  The report date is December 28, 2020.    The Common Hereditary Cancers Panel offered by [facility] includes sequencing and/or deletion duplication testing of the following 47 genes: APC, ATM, AXIN2, BARD1, BMPR1A, BRCA1, BRCA2, BRIP1, CDH1, CDK4, CDKN2A (p14ARF), CDKN2A (p16INK4a), CHEK2, CTNNA1, DICER1, EPCAM (Deletion/duplication testing only), GREM1 (promoter region deletion/duplication testing only), GREM1, HOXB13, KIT, MEN1, MLH1, MSH2, MSH3, MSH6, MUTYH, NBN, NF1, NHTL1, PALB2, PDGFRA, PMS2, POLD1, POLE, PTEN, RAD50, RAD51C, RAD51D, SDHA, SDHB, SDHC, SDHD, SMAD4, SMARCA4. STK11, TP53, TSC1, TSC2, and VHL.  The following genes were evaluated for sequence changes only: SDHA and HOXB13 c.251G>A variant only.   01/17/2021 Imaging   CT CAP from [facility] IMPRESSION Small lesion in the pancreatic head measuring approximately 1.1 x 0.8 cm without evidence of vascular involvement or metastasis consistent with previously described, biopsy-proven pancreatic adenocarcinoma.   02/08/2021 Surgery   Whipple Surgery with Dr Abran Kallman  Final Pathologic Diagnosis       A.  GALLBLADDER, CHOLECYSTECTOMY: Chronic cholecystitis. No malignancy identified.   B.  BILE DUCT STENT, REMOVAL (GROSS ONLY DIAGNOSIS): Stent, see gross description.   C.  WHIPPLE RESECTION: No residual malignancy identified. Chronic and acute inflammation with granulation tissue reaction, fibrosis and features of chronic pancreatitis, suggestive of therapy effect. Fourteen lymph nodes, negative for metastasis (0/14). Margins negative for malignancy.        06/07/2021 Imaging   CT AP  IMPRESSION: 1. No current findings of recurrent malignancy. Interval Whipple procedure with expected postoperative findings. 2. A 3.5 cm stent is present in the dorsal pancreatic duct. No duct dilatation. There is an approximately 1.5 mm lucency centrally along this stent shown on image 43 series 7, possibilities include stent fracture, two separate stents, or an intentional radial lucency in this type of stent. 3. Small type 1 hiatal hernia. Mild distal esophageal wall thickening may reflect low-grade esophagitis.  4.  Aortic Atherosclerosis (ICD10-I70.0). 5.  Prominent stool throughout the colon favors constipation. 6. Uterine fibroid. 7. 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   11/14/2023 - 01/25/2024 Chemotherapy   Patient is on Treatment Plan : PANCREAS NALIRIFOX D1, 15 Q28D     02/20/2024 - 02/20/2024 Chemotherapy   Patient is on Treatment Plan : PANCREATIC Abraxane  D1,8,15 + Gemcitabine  D1,8,15 q28d     08/21/2024 -  Chemotherapy   Patient is on Treatment Plan : PANCREATIC Abraxane  D1,8,15 + Gemcitabine  D1,8,15 q28d     Port-A-Cath in place      REVIEW OF SYSTEMS:   Constitutional: Denies fevers, chills or abnormal weight loss. Fatigue.  Eyes: Denies blurriness of vision Ears, nose, mouth, throat, and face: Denies mucositis or sore throat Respiratory: Denies cough, dyspnea or wheezes Cardiovascular: Denies palpitation, chest  discomfort or lower extremity swelling Gastrointestinal:  Denies nausea, heartburn or change in bowel habits Skin: Denies abnormal skin rashes Lymphatics: Denies new lymphadenopathy or easy bruising Neurological:Denies numbness, tingling or new weaknesses Behavioral/Psych: Mood is stable, no new changes  All other systems were reviewed with the patient and are negative.   VITALS:   Today's Vitals   12/11/24 0908  BP: 124/78  Pulse: 90  Temp: 100 F (37.8 C)  TempSrc: Temporal  SpO2: 97%  Weight: 109 lb 11.2 oz (49.8 kg)  Height: 5' 1 (1.549 m)   Body mass index is 20.73 kg/m.   Wt Readings from Last 3 Encounters:  12/11/24 109 lb 11.2 oz (49.8 kg)  11/28/24 111 lb 12.4 oz (50.7 kg)  11/26/24 111 lb 14.4 oz (50.8 kg)    Body mass index is 20.73 kg/m.  Performance status (ECOG): 2 - Symptomatic, <50% confined to bed  PHYSICAL EXAM:   GENERAL:alert, no distress and comfortable SKIN: skin color, texture, turgor are normal, no rashes or significant lesions EYES: normal, Conjunctiva are pink and non-injected, sclera clear OROPHARYNX:no exudate, no erythema and lips, buccal mucosa, and tongue normal  NECK: supple, thyroid normal size, non-tender, without nodularity LYMPH:  no palpable lymphadenopathy in the cervical, axillary or inguinal LUNGS: clear to auscultation and percussion with normal breathing effort HEART: regular rate & rhythm and no murmurs and no lower extremity edema ABDOMEN:abdomen soft, non-tender and normal bowel sounds Musculoskeletal:no cyanosis of digits and no clubbing  she is currently in wheelchair due to recent hip fractures. She is in wirst splint on the right arm.  NEURO: alert & oriented x 3 with fluent speech, no focal motor/sensory deficits  LABORATORY DATA:  I have reviewed the data as listed    Component Value Date/Time   NA 137 12/11/2024 0845   K 3.7 12/11/2024 0845   CL 100 12/11/2024 0845   CO2 27 12/11/2024 0845   GLUCOSE 124 (H)  12/11/2024 0845   BUN 13 12/11/2024 0845   CREATININE 0.84 12/11/2024 0845   CALCIUM  8.7 (L) 12/11/2024 0845   PROT 6.5 12/11/2024 0845   ALBUMIN 3.4 (L) 12/11/2024 0845   AST 26 12/11/2024 0845   ALT 18 12/11/2024 0845   ALKPHOS 195 (H) 12/11/2024 0845   BILITOT 0.6 12/11/2024 0845   GFRNONAA >60 12/11/2024 0845   GFRAA >60 08/23/2020 0606     Lab Results  Component Value Date   WBC 6.7 12/11/2024   NEUTROABS 4.9 12/11/2024   HGB 10.6 (L) 12/11/2024   HCT 32.2 (L) 12/11/2024   MCV 94.7 12/11/2024   PLT 590 (H) 12/11/2024  RADIOGRAPHIC STUDIES: DG HIP UNILAT WITH PELVIS 2-3 VIEWS RIGHT Result Date: 11/29/2024 CLINICAL DATA:  Elective surgery. EXAM: DG HIP (WITH OR WITHOUT PELVIS) 2-3V RIGHT COMPARISON:  Preoperative imaging FINDINGS: Three fluoroscopic spot views of the hip submitted from the operating room. Femoral intramedullary nail with trans trochanteric and distal locking screw fixation traverse proximal femur fracture. Fluoroscopy time 58 seconds. Dose 5.55 mGy. IMPRESSION: Intraoperative fluoroscopy during proximal femur fracture ORIF. Electronically Signed   By: Andrea Gasman M.D.   On: 11/29/2024 11:37   DG C-Arm 1-60 Min-No Report Result Date: 11/29/2024 Fluoroscopy was utilized by the requesting physician.  No radiographic interpretation.   DG Wrist Complete Right Result Date: 11/28/2024 EXAM: 3 OR MORE VIEW(S) XRAY OF THE WRIST 11/28/2024 05:19:16 PM COMPARISON: None available. CLINICAL HISTORY: fall FINDINGS: BONES AND JOINTS: Mildly impacted fracture of distal radial metaphysis. Neutral tilt of the distal radial articular surface. No dislocation. No malalignment. Mild osteopenia. SOFT TISSUES: Mild soft tissue swelling. IMPRESSION: 1. Mildly impacted fracture of the distal radial metaphysis with neutral tilt of the distal radial articular surface. No dislocation. 2. Mild soft tissue swelling. Electronically signed by: Dorethia Molt MD MD 11/28/2024 05:38 PM EST  RP Workstation: [id number]   DG Hip Unilat  With Pelvis 2-3 Views Right Result Date: 11/28/2024 EXAM: 2 OR MORE VIEW(S) XRAY OF THE RIGHT HIP 11/28/2024 05:19:16 PM COMPARISON: None available. CLINICAL HISTORY: severe pain per pt, fall FINDINGS: BONES AND JOINTS: Intertrochanteric fracture of the right hip with varus angulation. SOFT TISSUES: Large rectal stool ball. IMPRESSION: 1. Intertrochanteric fracture of the right hip with varus angulation. 2. Large rectal stool ball. Electronically signed by: Dorethia Molt MD MD 11/28/2024 05:37 PM EST RP Workstation: [id number]   "

## 2024-12-10 NOTE — Assessment & Plan Note (Addendum)
 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 Staging was negative for metastatic disease.  -she completed neoadjuvant chemo FOLFIRINOX q2 weeks 09/12/20 - 12/29/20. -s/p whipple surgery by Dr Debora on 02/08/21, path showed no residual disease.  -due to the high recurrence risk of pancreatic cancer, she is under close surveillance.  -Her PET scan from September 30, 2023 showed hypermetabolic soft tissue mass at the previous Whipple surgical site, and a small mildly hypermetabolic nodule in the left lung, concerning for metastatic recurrence. -lung biopsy 10/28/2023 confirmed adenocarcinoma, IHC consistent with metastatic pancreatic ca.  -she started chemo NALIRIFOX  on 11/14/23, had a significant diarrhea, required dose reduction. S/p 6 cycles  -restaging CT 02/03/2024 showed good partial response  -she started consolidation SBRT to lung and pancrease on 03/16/2024 and completed on 03/26/2024 -CT 05/21/2024 showed  treatment response in pancreas and lung nodule, but also showed scattered new left upper lobe pulmonary nodules measure up to 6 mm concerning for progressive pulmonary metastatic disease.  Her tumor marker CA 19.9 dropped down to normal previously and is back to 300's now, concerning for disease progression.  -she was asymptomatic and reluctant to have chemo again, so we decided to wait for 2-3 months. -repeated CT 07/29/24 showed disease progression in lung and new peritoneal metastasis - it was recommended she restart chemotherapy with gemcitabine  and Abraxane  every 2 weeks, due to her previous poor tolerance to chemo. She started on 08/21/2024 -CT 11/04/2024 showed cancer progression in the peritoneum --CA 19 9 level has continued to  improve despite minimal cancer progression in the peritoneum.  For now, we will continue chemotherapy gemcitabine  and Abraxane  every 2 weeks.  Repeat CT CAP in mid-to-late February 2026.  If progression continues, will change chemotherapy to FOLFOX or 5-FU and liposomal irinotecan  - 12/11/2024 -holding chemotherapy today due to recent surgery to repair acute hip fracture after a fall at home.  She was transfused 1 unit PRBCs during hospitalization.  Anemia has improved to baseline as of today's visit.  CA 19-9 level starting to climb.  For now, plan to resume chemotherapy with gemcitabine  and Abraxane  in 2 weeks.

## 2024-12-11 ENCOUNTER — Inpatient Hospital Stay: Admitting: Nurse Practitioner

## 2024-12-11 ENCOUNTER — Inpatient Hospital Stay

## 2024-12-11 VITALS — BP 124/78 | HR 90 | Temp 100.0°F | Ht 61.0 in | Wt 109.7 lb

## 2024-12-11 DIAGNOSIS — C250 Malignant neoplasm of head of pancreas: Secondary | ICD-10-CM

## 2024-12-11 DIAGNOSIS — D649 Anemia, unspecified: Secondary | ICD-10-CM

## 2024-12-11 LAB — CMP (CANCER CENTER ONLY)
ALT: 18 U/L (ref 0–44)
AST: 26 U/L (ref 15–41)
Albumin: 3.4 g/dL — ABNORMAL LOW (ref 3.5–5.0)
Alkaline Phosphatase: 195 U/L — ABNORMAL HIGH (ref 38–126)
Anion gap: 10 (ref 5–15)
BUN: 13 mg/dL (ref 8–23)
CO2: 27 mmol/L (ref 22–32)
Calcium: 8.7 mg/dL — ABNORMAL LOW (ref 8.9–10.3)
Chloride: 100 mmol/L (ref 98–111)
Creatinine: 0.84 mg/dL (ref 0.44–1.00)
GFR, Estimated: >60 mL/min
Glucose, Bld: 124 mg/dL — ABNORMAL HIGH (ref 70–99)
Potassium: 3.7 mmol/L (ref 3.5–5.1)
Sodium: 137 mmol/L (ref 135–145)
Total Bilirubin: 0.6 mg/dL (ref 0.0–1.2)
Total Protein: 6.5 g/dL (ref 6.5–8.1)

## 2024-12-11 LAB — CBC WITH DIFFERENTIAL (CANCER CENTER ONLY)
Abs Immature Granulocytes: 0.06 K/uL (ref 0.00–0.07)
Basophils Absolute: 0 K/uL (ref 0.0–0.1)
Basophils Relative: 0 %
Eosinophils Absolute: 0 K/uL (ref 0.0–0.5)
Eosinophils Relative: 1 %
HCT: 32.2 % — ABNORMAL LOW (ref 36.0–46.0)
Hemoglobin: 10.6 g/dL — ABNORMAL LOW (ref 12.0–15.0)
Immature Granulocytes: 1 %
Lymphocytes Relative: 11 %
Lymphs Abs: 0.8 K/uL (ref 0.7–4.0)
MCH: 31.2 pg (ref 26.0–34.0)
MCHC: 32.9 g/dL (ref 30.0–36.0)
MCV: 94.7 fL (ref 80.0–100.0)
Monocytes Absolute: 0.9 K/uL (ref 0.1–1.0)
Monocytes Relative: 14 %
Neutro Abs: 4.9 K/uL (ref 1.7–7.7)
Neutrophils Relative %: 73 %
Platelet Count: 590 K/uL — ABNORMAL HIGH (ref 150–400)
RBC: 3.4 MIL/uL — ABNORMAL LOW (ref 3.87–5.11)
RDW: 15.5 % (ref 11.5–15.5)
WBC Count: 6.7 K/uL (ref 4.0–10.5)
nRBC: 0 % (ref 0.0–0.2)

## 2024-12-11 LAB — IRON AND IRON BINDING CAPACITY (CC-WL,HP ONLY)
Iron: 38 ug/dL (ref 28–170)
Saturation Ratios: 15 % (ref 10.4–31.8)
TIBC: 258 ug/dL (ref 250–450)
UIBC: 220 ug/dL

## 2024-12-11 LAB — FERRITIN: Ferritin: 497 ng/mL — ABNORMAL HIGH (ref 11–307)

## 2024-12-11 LAB — SAMPLE TO BLOOD BANK

## 2024-12-12 LAB — CANCER ANTIGEN 19-9: CA 19-9: 1841 U/mL — ABNORMAL HIGH (ref 0–35)

## 2024-12-13 ENCOUNTER — Encounter: Payer: Self-pay | Admitting: Hematology

## 2024-12-13 ENCOUNTER — Encounter: Payer: Self-pay | Admitting: Nurse Practitioner

## 2024-12-25 ENCOUNTER — Inpatient Hospital Stay

## 2024-12-25 ENCOUNTER — Inpatient Hospital Stay: Admitting: Nurse Practitioner

## 2024-12-28 ENCOUNTER — Inpatient Hospital Stay: Attending: Hematology

## 2024-12-28 ENCOUNTER — Inpatient Hospital Stay: Admitting: Hematology

## 2024-12-28 ENCOUNTER — Inpatient Hospital Stay

## 2025-01-08 ENCOUNTER — Inpatient Hospital Stay

## 2025-01-08 ENCOUNTER — Inpatient Hospital Stay: Admitting: Nurse Practitioner
